# Patient Record
Sex: Male | Born: 1945
Health system: Southern US, Community
[De-identification: ages and names within clinical notes are randomized; demographics above are authoritative.]

## PROBLEM LIST (undated history)

## (undated) DIAGNOSIS — F419 Anxiety disorder, unspecified: Secondary | ICD-10-CM

## (undated) DIAGNOSIS — C3491 Malignant neoplasm of unspecified part of right bronchus or lung: Principal | ICD-10-CM

## (undated) DIAGNOSIS — I82409 Acute embolism and thrombosis of unspecified deep veins of unspecified lower extremity: Secondary | ICD-10-CM

## (undated) DIAGNOSIS — E7800 Pure hypercholesterolemia, unspecified: Secondary | ICD-10-CM

## (undated) DIAGNOSIS — I10 Essential (primary) hypertension: Secondary | ICD-10-CM

## (undated) DIAGNOSIS — K5792 Diverticulitis of intestine, part unspecified, without perforation or abscess without bleeding: Secondary | ICD-10-CM

## (undated) DIAGNOSIS — Z5111 Encounter for antineoplastic chemotherapy: Secondary | ICD-10-CM

## (undated) DIAGNOSIS — K219 Gastro-esophageal reflux disease without esophagitis: Secondary | ICD-10-CM

## (undated) DIAGNOSIS — Z923 Personal history of irradiation: Secondary | ICD-10-CM

## (undated) DIAGNOSIS — I2510 Atherosclerotic heart disease of native coronary artery without angina pectoris: Secondary | ICD-10-CM

## (undated) DIAGNOSIS — M1990 Unspecified osteoarthritis, unspecified site: Secondary | ICD-10-CM

## (undated) DIAGNOSIS — I739 Peripheral vascular disease, unspecified: Secondary | ICD-10-CM

## (undated) DIAGNOSIS — R0602 Shortness of breath: Secondary | ICD-10-CM

## (undated) HISTORY — DX: Shortness of breath: R06.02

## (undated) HISTORY — DX: Unspecified osteoarthritis, unspecified site: M19.90

## (undated) HISTORY — DX: Essential (primary) hypertension: I10

## (undated) HISTORY — DX: Malignant neoplasm of unspecified part of right bronchus or lung: C34.91

## (undated) HISTORY — DX: Peripheral vascular disease, unspecified: I73.9

## (undated) HISTORY — PX: OTHER SURGICAL HISTORY: SHX169

## (undated) HISTORY — DX: Encounter for antineoplastic chemotherapy: Z51.11

## (undated) HISTORY — DX: Pure hypercholesterolemia, unspecified: E78.00

## (undated) HISTORY — DX: Atherosclerotic heart disease of native coronary artery without angina pectoris: I25.10

## (undated) HISTORY — DX: Anxiety disorder, unspecified: F41.9

---

## 2002-09-09 ENCOUNTER — Encounter: Admission: RE | Admit: 2002-09-09 | Discharge: 2002-09-09 | Payer: Self-pay | Admitting: Family Medicine

## 2002-09-09 ENCOUNTER — Encounter: Payer: Self-pay | Admitting: Family Medicine

## 2005-06-29 ENCOUNTER — Ambulatory Visit: Payer: Self-pay | Admitting: Gastroenterology

## 2007-08-09 ENCOUNTER — Emergency Department (HOSPITAL_COMMUNITY): Admission: EM | Admit: 2007-08-09 | Discharge: 2007-08-09 | Payer: Self-pay | Admitting: Emergency Medicine

## 2007-11-19 ENCOUNTER — Ambulatory Visit: Payer: Self-pay | Admitting: Cardiology

## 2007-11-21 ENCOUNTER — Emergency Department (HOSPITAL_COMMUNITY): Admission: EM | Admit: 2007-11-21 | Discharge: 2007-11-21 | Payer: Self-pay | Admitting: Emergency Medicine

## 2007-12-11 ENCOUNTER — Ambulatory Visit: Payer: Self-pay

## 2007-12-11 ENCOUNTER — Ambulatory Visit: Payer: Self-pay | Admitting: Cardiology

## 2008-02-12 ENCOUNTER — Ambulatory Visit: Payer: Self-pay | Admitting: Cardiology

## 2008-03-20 ENCOUNTER — Ambulatory Visit: Payer: Self-pay | Admitting: Cardiology

## 2008-08-26 DIAGNOSIS — I10 Essential (primary) hypertension: Secondary | ICD-10-CM | POA: Insufficient documentation

## 2008-08-26 DIAGNOSIS — E119 Type 2 diabetes mellitus without complications: Secondary | ICD-10-CM | POA: Insufficient documentation

## 2008-08-26 DIAGNOSIS — F411 Generalized anxiety disorder: Secondary | ICD-10-CM | POA: Insufficient documentation

## 2008-08-26 DIAGNOSIS — M129 Arthropathy, unspecified: Secondary | ICD-10-CM | POA: Insufficient documentation

## 2008-08-26 DIAGNOSIS — G20 Parkinson's disease: Secondary | ICD-10-CM | POA: Insufficient documentation

## 2008-08-26 DIAGNOSIS — R0602 Shortness of breath: Secondary | ICD-10-CM | POA: Insufficient documentation

## 2008-08-26 DIAGNOSIS — E7800 Pure hypercholesterolemia, unspecified: Secondary | ICD-10-CM | POA: Insufficient documentation

## 2008-08-27 ENCOUNTER — Ambulatory Visit: Payer: Self-pay | Admitting: Cardiology

## 2008-08-27 ENCOUNTER — Encounter: Payer: Self-pay | Admitting: Cardiology

## 2008-09-02 ENCOUNTER — Ambulatory Visit: Payer: Self-pay | Admitting: Cardiology

## 2008-09-03 LAB — CONVERTED CEMR LAB
BUN: 13 mg/dL (ref 6–23)
Basophils Relative: 0.8 % (ref 0.0–3.0)
CO2: 27 meq/L (ref 19–32)
Calcium: 9 mg/dL (ref 8.4–10.5)
Creatinine, Ser: 0.9 mg/dL (ref 0.4–1.5)
Eosinophils Relative: 1.7 % (ref 0.0–5.0)
Hemoglobin: 16 g/dL (ref 13.0–17.0)
INR: 0.9 (ref 0.8–1.0)
Lymphocytes Relative: 28.7 % (ref 12.0–46.0)
Neutro Abs: 5.2 10*3/uL (ref 1.4–7.7)
Neutrophils Relative %: 62 % (ref 43.0–77.0)
RBC: 5.05 M/uL (ref 4.22–5.81)
WBC: 8.4 10*3/uL (ref 4.5–10.5)

## 2008-09-04 ENCOUNTER — Inpatient Hospital Stay (HOSPITAL_BASED_OUTPATIENT_CLINIC_OR_DEPARTMENT_OTHER): Admission: RE | Admit: 2008-09-04 | Discharge: 2008-09-04 | Payer: Self-pay | Admitting: Cardiovascular Disease

## 2008-09-04 ENCOUNTER — Ambulatory Visit: Payer: Self-pay | Admitting: Cardiology

## 2008-09-08 ENCOUNTER — Ambulatory Visit: Payer: Self-pay | Admitting: Cardiology

## 2008-09-08 ENCOUNTER — Encounter (INDEPENDENT_AMBULATORY_CARE_PROVIDER_SITE_OTHER): Payer: Self-pay

## 2008-09-08 DIAGNOSIS — I2510 Atherosclerotic heart disease of native coronary artery without angina pectoris: Secondary | ICD-10-CM | POA: Insufficient documentation

## 2008-09-15 ENCOUNTER — Telehealth: Payer: Self-pay | Admitting: Cardiology

## 2008-10-02 ENCOUNTER — Encounter: Payer: Self-pay | Admitting: Cardiology

## 2008-10-02 ENCOUNTER — Telehealth: Payer: Self-pay | Admitting: Cardiology

## 2008-10-15 ENCOUNTER — Inpatient Hospital Stay (HOSPITAL_COMMUNITY): Admission: AD | Admit: 2008-10-15 | Discharge: 2008-10-16 | Payer: Self-pay | Admitting: Cardiology

## 2008-10-15 ENCOUNTER — Ambulatory Visit: Payer: Self-pay | Admitting: Cardiology

## 2008-11-12 ENCOUNTER — Ambulatory Visit: Payer: Self-pay | Admitting: Internal Medicine

## 2008-11-12 DIAGNOSIS — Z720 Tobacco use: Secondary | ICD-10-CM | POA: Insufficient documentation

## 2008-11-18 ENCOUNTER — Telehealth: Payer: Self-pay | Admitting: Cardiology

## 2009-02-09 ENCOUNTER — Ambulatory Visit (HOSPITAL_COMMUNITY): Admission: RE | Admit: 2009-02-09 | Discharge: 2009-02-09 | Payer: Self-pay | Admitting: Pulmonary Disease

## 2009-03-06 ENCOUNTER — Encounter (INDEPENDENT_AMBULATORY_CARE_PROVIDER_SITE_OTHER): Payer: Self-pay | Admitting: *Deleted

## 2009-05-12 ENCOUNTER — Ambulatory Visit: Payer: Self-pay | Admitting: Cardiology

## 2009-05-21 ENCOUNTER — Encounter (INDEPENDENT_AMBULATORY_CARE_PROVIDER_SITE_OTHER): Payer: Self-pay | Admitting: *Deleted

## 2009-05-21 ENCOUNTER — Ambulatory Visit (HOSPITAL_COMMUNITY): Admission: RE | Admit: 2009-05-21 | Discharge: 2009-05-21 | Payer: Self-pay | Admitting: Family Medicine

## 2009-05-22 ENCOUNTER — Encounter (INDEPENDENT_AMBULATORY_CARE_PROVIDER_SITE_OTHER): Payer: Self-pay | Admitting: *Deleted

## 2009-06-03 ENCOUNTER — Ambulatory Visit (HOSPITAL_COMMUNITY): Admission: RE | Admit: 2009-06-03 | Discharge: 2009-06-05 | Payer: Self-pay | Admitting: General Surgery

## 2009-07-29 ENCOUNTER — Ambulatory Visit (HOSPITAL_COMMUNITY): Admission: RE | Admit: 2009-07-29 | Discharge: 2009-07-29 | Payer: Self-pay | Admitting: General Surgery

## 2009-10-02 ENCOUNTER — Encounter: Admission: RE | Admit: 2009-10-02 | Discharge: 2009-10-02 | Payer: Self-pay | Admitting: Gastroenterology

## 2009-11-18 ENCOUNTER — Ambulatory Visit: Payer: Self-pay | Admitting: Cardiology

## 2009-11-25 ENCOUNTER — Encounter: Payer: Self-pay | Admitting: Cardiology

## 2010-01-27 ENCOUNTER — Emergency Department (HOSPITAL_COMMUNITY): Admission: EM | Admit: 2010-01-27 | Discharge: 2010-01-27 | Payer: Self-pay | Admitting: Emergency Medicine

## 2010-01-28 ENCOUNTER — Ambulatory Visit (HOSPITAL_COMMUNITY): Admission: RE | Admit: 2010-01-28 | Discharge: 2010-01-28 | Payer: Self-pay | Admitting: Family Medicine

## 2010-03-31 ENCOUNTER — Emergency Department (HOSPITAL_COMMUNITY): Admission: EM | Admit: 2010-03-31 | Discharge: 2010-03-31 | Payer: Self-pay | Admitting: Emergency Medicine

## 2010-05-12 ENCOUNTER — Ambulatory Visit: Payer: Self-pay | Admitting: Cardiology

## 2010-06-13 LAB — CONVERTED CEMR LAB
Albumin: 4.7 g/dL (ref 3.5–5.2)
Alkaline Phosphatase: 74 units/L (ref 39–117)
BUN: 18 mg/dL (ref 6–23)
Bilirubin, Direct: 0.1 mg/dL (ref 0.0–0.3)
CO2: 27 meq/L (ref 19–32)
Chloride: 106 meq/L (ref 96–112)
Cholesterol: 123 mg/dL (ref 0–200)
Glucose, Bld: 150 mg/dL — ABNORMAL HIGH (ref 70–99)
Potassium: 4.5 meq/L (ref 3.5–5.1)
Total Bilirubin: 0.6 mg/dL (ref 0.3–1.2)
Total CHOL/HDL Ratio: 3
VLDL: 42.6 mg/dL — ABNORMAL HIGH (ref 0.0–40.0)

## 2010-06-15 NOTE — Assessment & Plan Note (Signed)
Summary: f16m   Visit Type:  6 months follow up  CC:  No complains.  History of Present Illness: Overall he is doing well.  Continues to smoke.  Gets hot and sweaty at times, but lays under the air conditioning.  He has not been having chest pain.  Mostly worried about his wife.    Current Medications (verified): 1)  Comtan 200 Mg Tabs (Entacapone) .... Three Times A Day 2)  Alprazolam 0.25 Mg Tbdp (Alprazolam) .[name].. 1 Tab Two Times A Day 3)  Ropinirole Hcl 3 Mg Tabs (Ropinirole Hcl) .[name].. 1 Tab Four Times A Day 4)  Topamax 100 Mg Tabs (Topiramate) .[name].. 1 Tab Two Times A Day 5)  Benzonatate 200 Mg Caps (Benzonatate) .... As Needed 6)  Metformin Hcl 500 Mg Tabs (Metformin Hcl) .[name].. 1 Tab Two Times A Day 7)  Morphine Sulfate 15 Mg/ml Soln (Morphine Sulfate) .... Tid 8)  Amitriptyline Hcl 50 Mg Tabs (Amitriptyline Hcl) .... 2 Tabs At Bedtime 9)  Vitamin B-12 Cr 2000 Mcg Cr-Tabs (Cyanocobalamin) .[name].. 1 Tab Daily 10)  Tizanidine Hcl 4 Mg Tabs (Tizanidine Hcl) .[name].. 1 Tab 3 X Daily 11)  Plavix 75 Mg Tabs (Clopidogrel Bisulfate) .[name].. 1 Tab Daily 12)  Carbidopa-Levodopa Cr 50-200 Mg Cr-Tabs (Carbidopa-Levodopa) .... Take 1 Tablet By Mouth Two Times A Day 13)  Zetia 10 Mg Tabs (Ezetimibe) .... Daily 14)  Protonix 40 Mg Tbec (Pantoprazole Sodium) .[name].. 1 Tab Once Daily 15)  Levothyroxine Sodium 25 Mcg Tabs (Levothyroxine Sodium) .... Take 1 Tablet By Mouth Once A Day 16)  Meloxicam 15 Mg Tabs (Meloxicam) .... Take 1 Tablet By Mouth Once A Day 17)  Nitrostat 0.4 Mg Subl (Nitroglycerin) .... As Needed 18)  Crestor 20 Mg Tabs (Rosuvastatin Calcium) .... Take One Tablet By Mouth Daily. 19)  Gabapentin 300 Mg Caps (Gabapentin) .... Take 1 Capsule By Mouth Two Times A Day  Allergies: 1)  ! Aspirin  Vital Signs:  Patient profile:   65 year old male Height:      68 inches Weight:      191.75 pounds BMI:     29.26 Pulse rate:   62 / minute Pulse rhythm:   regular Resp:     18 per minute BP  sitting:   130 / 70  (left arm) Cuff size:   large  Vitals Entered By: Vikki Ports (November 18, 2009 11:39 AM)  Physical Exam  General:  Well developed, well nourished, in no acute distress. Head:  normocephalic and atraumatic Eyes:  PERRLA/EOM intact; conjunctiva and lids normal. Neck:  No bruits.  Lungs:  Clear bilaterally to auscultation and percussion. Heart:  Distant heart sounds.  No definite murmur. Abdomen:  Umbilical hernia repair.  Neurologic:  Alert and oriented x 3.   EKG  Procedure date:  11/18/2009  Findings:      NSR. WNL.   Cardiac Cath  Procedure date:  10/15/2008  Findings:      ANGIOGRAPHIC DATA: 1. The left main has calcification, mild luminal irregularity, but     noncritical disease. 2. The LAD has a lesion of probably 70-80% that is calcified and     eccentric in the ostium.  The distal left anterior descending     artery has diffuse luminal irregularity compatible with diabetes.     The diagonal also has similar findings.  The circumflex artery has     diffuse 40% narrowing leading into a moderate size marginal branch,     but  it does not appear to be flow limiting.  Fractional flow     reserve was performed after 40 mcg of adenosine and also 50 mcg of     adenosine, in both cases it was measured at 0.94. 3. Intravascular ultrasound was performed after intracoronary     nitroglycerin administration.  The reference vessel demonstrated a     lumen of approximately 3.5 x nearly 3.5 with moderate plaque.  The     ostium leading into the left main does have an eccentric calcified     shelf, but at the most severe point where the left main comes in,     it is probably 3 mm x 2 mm.  This corresponds and correlates to     some extent with the findings of FFR.   CONCLUSION:  Moderately high-grade ostial stenosis of the left anterior descending artery, which by intravascular ultrasound and fractional flow reserve, does not appear to be flow limiting.     DISPOSITION:  My biggest concern is the patient has diabetes and continues to smoke.  His risk of acute coronary syndrome remains increased.  We will try to do our best to try to change this behavior, I have reviewed the case with Dr. Excell Seltzer Dr. Clifton James and leaning at the present time towards medical therapy.         [name]. Riley Kill, MD, [facility] Electronically Signed        Impression & Recommendations:  Problem # 1:  CAD (ICD-414.00)  Sympotms are stable at this point in time. Denies progressive chest pain.  See cath report from one year ago.  Have discussed smoking. His updated medication list for this problem includes:    Plavix 75 Mg Tabs (Clopidogrel bisulfate) .[name]... 1 tab daily    Nitrostat 0.4 Mg Subl (Nitroglycerin) .[name]... As needed  His updated medication list for this problem includes:    Plavix 75 Mg Tabs (Clopidogrel bisulfate) .[name]... 1 tab daily    Nitrostat 0.4 Mg Subl (Nitroglycerin) .[name]... As needed  Orders: EKG w/ Interpretation (93000) TLB-BMP (Basic Metabolic Panel-BMET) (80048-METABOL) TLB-Lipid Panel (80061-LIPID) TLB-Hepatic/Liver Function Pnl (80076-HEPATIC)  Problem # 2:  HYPERTENSION, UNSPECIFIED (ICD-401.9) Currently controlled.   Problem # 3:  HYPERCHOLESTEROLEMIA  IIA (ICD-272.0) Needs lab work done.  His updated medication list for this problem includes:    Zetia 10 Mg Tabs (Ezetimibe) .[name]... Daily    Crestor 20 Mg Tabs (Rosuvastatin calcium) .[name]... Take one tablet by mouth daily.  Orders: EKG w/ Interpretation (93000) TLB-BMP (Basic Metabolic Panel-BMET) (80048-METABOL) TLB-Lipid Panel (80061-LIPID) TLB-Hepatic/Liver Function Pnl (80076-HEPATIC)  Problem # 4:  TOBACCO ABUSE (ICD-305.1) difficult to crack with regard to needs.  Clearly important from prognostic standpoint.   Patient Instructions: 1)  Your physician recommends that you have a Lipid and Liver profile today.  2)  Your physician recommends that you continue on your current  medications as directed. Please refer to the Current Medication list given to you today. 3)  Your physician wants you to follow-up in: 6 MONTHS.  You will receive a reminder letter in the mail two months in advance. If you don't receive a letter, please call our office to schedule the follow-up appointment.

## 2010-06-15 NOTE — Letter (Signed)
Summary: Internal Medicine & Disease of the Chest   Internal Medicine & Disease of the Chest   Imported By: Roderic Ovens 12/11/2009 13:44:43  _____________________________________________________________________  External Attachment:    Type:   Image     Comment:   External Document

## 2010-06-15 NOTE — Letter (Signed)
Summary: New Patient letter  [facility]  [address] [place], [place] [address]   Phone: [phone]  Fax: [phone]       05/22/2009 MRN: [id number]  Matthew Wilkins [address] [place] [address] [place], [place]  [address]  Dear Mr. Matthew Wilkins,  Welcome to the Gastroenterology Division at [facility].    You are scheduled to see Dr.  Stan Head on June 15, 2009 at 2:00pm on the 3rd floor at [facility], [address]  We ask that you try to arrive at our office 15 minutes prior to your appointment time to allow for check-in.  We would like you to complete the enclosed self-administered evaluation form prior to your visit and bring it with you on the day of your appointment.  We will review it with you.  Also, please bring a complete list of all your medications or, if you prefer, bring the medication bottles and we will list them.  Please bring your insurance card so that we may make a copy of it.  If your insurance requires a referral to see a specialist, please bring your referral form from your primary care physician.  Co-payments are due at the time of your visit and may be paid by cash, check or credit card.     Your office visit will consist of a consult with your physician (includes a physical exam), any laboratory testing he/she may order, scheduling of any necessary diagnostic testing (e.g. x-ray, ultrasound, CT-scan), and scheduling of a procedure (e.g. Endoscopy, Colonoscopy) if required.  Please allow enough time on your schedule to allow for any/all of these possibilities.    If you cannot keep your appointment, please call [phone] to cancel or reschedule prior to your appointment date.  This allows [place] the opportunity to schedule an appointment for another patient in need of care.  If you do not cancel or reschedule by 5 p.m. the business day prior to your appointment date, you will be charged a $50.00 late cancellation/no-show fee.    Thank you  for choosing [facility] for your medical needs.  We appreciate the opportunity to care for you.  Please visit [place] at our website  to learn more about our practice.                     Sincerely,                                                             The Gastroenterology Division

## 2010-07-06 ENCOUNTER — Encounter: Payer: Self-pay | Admitting: Cardiology

## 2010-07-06 ENCOUNTER — Ambulatory Visit (INDEPENDENT_AMBULATORY_CARE_PROVIDER_SITE_OTHER): Payer: Self-pay | Admitting: Cardiology

## 2010-07-06 DIAGNOSIS — J449 Chronic obstructive pulmonary disease, unspecified: Secondary | ICD-10-CM | POA: Insufficient documentation

## 2010-07-06 DIAGNOSIS — I2510 Atherosclerotic heart disease of native coronary artery without angina pectoris: Secondary | ICD-10-CM

## 2010-07-06 DIAGNOSIS — E7800 Pure hypercholesterolemia, unspecified: Secondary | ICD-10-CM

## 2010-07-22 NOTE — Assessment & Plan Note (Signed)
Summary: Matthew Wilkins   Primary Provider:  spear  CC:  check up.  History of Present Illness: Not having any chest pain.  Meds for pulmonary have helped.  His legs hands and shoulders have been bothering him more than anything.  He has been going to a chiropractor for a long time, and has arthitis for many years.  No exertional chest pain. He does not do much digging, or other heavy activities.  Still coughing quite a bit.    Problems Prior to Update: 1)  COPD  (ICD-496) 2)  Tobacco Abuse  (ICD-305.1) 3)  Cad  (ICD-414.00) 4)  Hypertension, Unspecified  (ICD-401.9) 5)  Hypercholesterolemia Iia  (ICD-272.0) 6)  Dm  (ICD-250.00) 7)  Shortness of Breath  (ICD-786.05) 8)  Anxiety  (ICD-300.00) 9)  Parkinson's Disease  (ICD-332.0) 10)  Arthritis  (ICD-716.90)  Current Medications (verified): 1)  Comtan 200 Mg Tabs (Entacapone) .... Three Times A Day 2)  Alprazolam 0.25 Mg Tbdp (Alprazolam) .[name].. 1 Tab Two Times A Day 3)  Ropinirole Hcl 3 Mg Tabs (Ropinirole Hcl) .[name].. 1 Tab Four Times A Day 4)  Topamax 100 Mg Tabs (Topiramate) .[name].. 1 Tab Two Times A Day 5)  Benzonatate 200 Mg Caps (Benzonatate) .... As Needed 6)  Metformin Hcl 500 Mg Tabs (Metformin Hcl) .[name].. 1 Tab Two Times A Day 7)  Amitriptyline Hcl 50 Mg Tabs (Amitriptyline Hcl) .... 2 Tabs At Bedtime 8)  Vitamin B-12 Cr 2000 Mcg Cr-Tabs (Cyanocobalamin) .[name].. 1 Tab Daily 9)  Tizanidine Hcl 4 Mg Tabs (Tizanidine Hcl) .[name].. 1 Tab 3 X Daily 10)  Plavix 75 Mg Tabs (Clopidogrel Bisulfate) .[name].. 1 Tab Daily 11)  Carbidopa-Levodopa Cr 50-200 Mg Cr-Tabs (Carbidopa-Levodopa) .... Take 1 Tablet By Mouth Two Times A Day 12)  Zetia 10 Mg Tabs (Ezetimibe) .... Daily 13)  Protonix 40 Mg Tbec (Pantoprazole Sodium) .[name].. 1 Tab Once Daily 14)  Levothyroxine Sodium 25 Mcg Tabs (Levothyroxine Sodium) .... Take 1 Tablet By Mouth Once A Day 15)  Meloxicam 15 Mg Tabs (Meloxicam) .... Take 1 Tablet By Mouth Once A Day 16)  Nitrostat 0.4 Mg Subl (Nitroglycerin) ....  As Needed 17)  Crestor 20 Mg Tabs (Rosuvastatin Calcium) .... Take One Tablet By Mouth Daily. 18)  Gabapentin 300 Mg Caps (Gabapentin) .... Take 1 Capsule By Mouth Two Times A Day  Allergies: 1)  ! Aspirin  Past History:  Past Medical History: Last updated: 11/12/2008 HYPERTENSION, UNSPECIFIED (ICD-401.9) HYPERCHOLESTEROLEMIA  IIA (ICD-272.0) DM (ICD-250.00) SHORTNESS OF BREATH (ICD-786.05) on exertion ANXIETY (ICD-300.00) PARKINSON'S DISEASE (ICD-332.0) ARTHRITIS (ICD-716.90) CAD      a.  10/2008 flow wire and ivus of ostial LAD revealing no significant flow limitation with FFR 0.94  Vital Signs:  Patient profile:   65 year old male Height:      68 inches Weight:      196 pounds BMI:     29.91 Pulse rate:   72 / minute Resp:     18 per minute BP sitting:   155 / 79  (left arm)  Vitals Entered By: Kem Parkinson (July 06, 2010 4:08 PM)  Physical Exam  General:  Well developed, well nourished, in no acute distress. Head:  normocephalic and atraumatic Eyes:  PERRLA/EOM intact; conjunctiva and lids normal. Lungs:  No rales.  Some prolonged expiration.  Heart:  Non-displaced PMI, chest non-tender; regular rate and rhythm, S1, S2 without murmurs, rubs or gallops. Carotid upstroke normal, no bruit.  Abdomen:  Bowel sounds positive; abdomen soft and  non-tender without masses, organomegaly, or hernias noted. No hepatosplenomegaly. Pulses:  decrease  Extremities:  No clubbing or cyanosis. Neurologic:  Alert and oriented x 3.   Cardiac Cath  Procedure date:  10/15/2008  Findings:      CONCLUSION:  Moderately high-grade ostial stenosis of the left anterior descending artery, which by intravascular ultrasound and fractional flow reserve, does not appear to be flow limiting.   DISPOSITION:  My biggest concern is the patient has diabetes and continues to smoke.  His risk of acute coronary syndrome remains increased.  We will try to do our best to try to change this  behavior, I have reviewed the case with Dr. Excell Seltzer Dr. Clifton James and leaning at the present time towards medical therapy.    Impression & Recommendations:  Problem # 1:  CAD (ICD-414.00) Had neg FFR in prox LAD.  Has no current symptoms.  Continues on medical therapy at present.  His updated medication list for this problem includes:    Plavix 75 Mg Tabs (Clopidogrel bisulfate) .[name]... 1 tab daily    Nitrostat 0.4 Mg Subl (Nitroglycerin) .[name]... As needed  Orders: EKG w/ Interpretation (93000)  Problem # 2:  HYPERCHOLESTEROLEMIA  IIA (ICD-272.0) Encouraged him to have meds rechecked with Dr. Collins Scotland on upcoming follow up. His updated medication list for this problem includes:    Zetia 10 Mg Tabs (Ezetimibe) .[name]... Daily    Crestor 20 Mg Tabs (Rosuvastatin calcium) .[name]... Take one tablet by mouth daily.  Problem # 3:  TOBACCO ABUSE (ICD-305.1) Still has not quit, and understands that he must do so, but been unable to accomplish.  His wife has also resumed smoking.   Smokes up to 1/2 PPD.   Problem # 4:  COPD (ICD-496) Still evidence on examination.  Patient Instructions: 1)  Your physician recommends that you schedule a follow-up appointment in: 6 months with Dr. Riley Kill 2)  Your physician recommends that you continue on your current medications as directed. Please refer to the Current Medication list given to you today.

## 2010-07-29 LAB — ETHANOL: Alcohol, Ethyl (B): <5 mg/dL (ref 0–10)

## 2010-07-29 LAB — URINALYSIS, ROUTINE W REFLEX MICROSCOPIC
Specific Gravity, Urine: 1.025 (ref 1.005–1.030)
Urobilinogen, UA: 0.2 mg/dL (ref 0.0–1.0)
pH: 6 (ref 5.0–8.0)

## 2010-07-29 LAB — RAPID URINE DRUG SCREEN, HOSP PERFORMED
Amphetamines: NOT DETECTED
Benzodiazepines: POSITIVE — AB

## 2010-07-29 LAB — DIFFERENTIAL
Basophils Absolute: 0.2 10*3/uL — ABNORMAL HIGH (ref 0.0–0.1)
Lymphocytes Relative: 28 % (ref 12–46)
Monocytes Relative: 6 % (ref 3–12)

## 2010-07-29 LAB — BASIC METABOLIC PANEL
BUN: 12 mg/dL (ref 6–23)
Creatinine, Ser: 0.98 mg/dL (ref 0.4–1.5)
GFR calc non Af Amer: >60 mL/min (ref >60)
Potassium: 4 mEq/L (ref 3.5–5.1)

## 2010-07-29 LAB — URINE MICROSCOPIC-ADD ON

## 2010-07-29 LAB — CBC
Platelets: 182 10*3/uL (ref 150–400)
RDW: 13.9 % (ref 11.5–15.5)
WBC: 8.1 10*3/uL (ref 4.0–10.5)

## 2010-07-29 LAB — GLUCOSE, CAPILLARY: Glucose-Capillary: 180 mg/dL — ABNORMAL HIGH (ref 70–99)

## 2010-08-01 LAB — BASIC METABOLIC PANEL
GFR calc non Af Amer: >60 mL/min (ref >60)
Potassium: 4.3 mEq/L (ref 3.5–5.1)
Sodium: 135 mEq/L (ref 135–145)

## 2010-08-01 LAB — HEMOGLOBIN AND HEMATOCRIT, BLOOD: Hemoglobin: 16.8 g/dL (ref 13.0–17.0)

## 2010-08-01 LAB — GLUCOSE, CAPILLARY: Glucose-Capillary: 284 mg/dL — ABNORMAL HIGH (ref 70–99)

## 2010-08-08 LAB — BASIC METABOLIC PANEL
CO2: 25 mEq/L (ref 19–32)
Calcium: 9.1 mg/dL (ref 8.4–10.5)
Creatinine, Ser: 0.86 mg/dL (ref 0.4–1.5)
GFR calc Af Amer: >60 mL/min (ref >60)

## 2010-08-08 LAB — CBC
MCHC: 34.3 g/dL (ref 30.0–36.0)
Platelets: 173 10*3/uL (ref 150–400)
RBC: 5.51 MIL/uL (ref 4.22–5.81)

## 2010-08-08 LAB — GLUCOSE, CAPILLARY
Glucose-Capillary: 138 mg/dL — ABNORMAL HIGH (ref 70–99)
Glucose-Capillary: 156 mg/dL — ABNORMAL HIGH (ref 70–99)

## 2010-08-19 ENCOUNTER — Other Ambulatory Visit (HOSPITAL_COMMUNITY): Payer: Self-pay | Admitting: Cardiovascular Disease

## 2010-08-19 ENCOUNTER — Encounter (HOSPITAL_COMMUNITY): Payer: Self-pay

## 2010-08-19 ENCOUNTER — Ambulatory Visit (HOSPITAL_COMMUNITY)
Admission: RE | Admit: 2010-08-19 | Discharge: 2010-08-19 | Disposition: A | Discharge status: Home / Self Care | Payer: Medicare Other | Source: Ambulatory Visit | Attending: Cardiovascular Disease | Admitting: Cardiovascular Disease

## 2010-08-19 DIAGNOSIS — J4489 Other specified chronic obstructive pulmonary disease: Secondary | ICD-10-CM | POA: Insufficient documentation

## 2010-08-19 DIAGNOSIS — Z01810 Encounter for preprocedural cardiovascular examination: Secondary | ICD-10-CM

## 2010-08-19 DIAGNOSIS — J449 Chronic obstructive pulmonary disease, unspecified: Secondary | ICD-10-CM | POA: Insufficient documentation

## 2010-08-19 DIAGNOSIS — Z01818 Encounter for other preprocedural examination: Secondary | ICD-10-CM | POA: Insufficient documentation

## 2010-08-20 LAB — BLOOD GAS, ARTERIAL
Acid-base deficit: 0.7 mmol/L (ref 0.0–2.0)
O2 Saturation: 95.6 %
Patient temperature: 37
pCO2 arterial: 36 mmHg (ref 35.0–45.0)

## 2010-08-23 LAB — BASIC METABOLIC PANEL
BUN: 10 mg/dL (ref 6–23)
Calcium: 8.9 mg/dL (ref 8.4–10.5)
Creatinine, Ser: 0.85 mg/dL (ref 0.4–1.5)
GFR calc Af Amer: >60 mL/min (ref >60)
GFR calc non Af Amer: >60 mL/min (ref >60)
GFR calc non Af Amer: >60 mL/min (ref >60)
Glucose, Bld: 162 mg/dL — ABNORMAL HIGH (ref 70–99)
Potassium: 4 mEq/L (ref 3.5–5.1)

## 2010-08-23 LAB — PROTIME-INR
INR: 1.1 (ref 0.00–1.49)
Prothrombin Time: 14.2 seconds (ref 11.6–15.2)

## 2010-08-23 LAB — CBC
HCT: 43 % (ref 39.0–52.0)
MCHC: 34.5 g/dL (ref 30.0–36.0)
MCV: 92.4 fL (ref 78.0–100.0)
Platelets: 143 10*3/uL — ABNORMAL LOW (ref 150–400)
RBC: 5.14 MIL/uL (ref 4.22–5.81)
RDW: 13.4 % (ref 11.5–15.5)
WBC: 7.2 10*3/uL (ref 4.0–10.5)

## 2010-08-23 LAB — GLUCOSE, CAPILLARY: Glucose-Capillary: 123 mg/dL — ABNORMAL HIGH (ref 70–99)

## 2010-08-25 LAB — POCT I-STAT GLUCOSE: Glucose, Bld: 144 mg/dL — ABNORMAL HIGH (ref 70–99)

## 2010-09-07 ENCOUNTER — Observation Stay (HOSPITAL_COMMUNITY)
Admission: RE | Admit: 2010-09-07 | Discharge: 2010-09-08 | Disposition: A | Discharge status: Home / Self Care | Payer: Medicare Other | Source: Ambulatory Visit | Attending: Cardiovascular Disease | Admitting: Cardiovascular Disease

## 2010-09-07 DIAGNOSIS — E785 Hyperlipidemia, unspecified: Secondary | ICD-10-CM | POA: Insufficient documentation

## 2010-09-07 DIAGNOSIS — I70219 Atherosclerosis of native arteries of extremities with intermittent claudication, unspecified extremity: Principal | ICD-10-CM | POA: Insufficient documentation

## 2010-09-07 DIAGNOSIS — R0609 Other forms of dyspnea: Secondary | ICD-10-CM | POA: Insufficient documentation

## 2010-09-07 DIAGNOSIS — F17200 Nicotine dependence, unspecified, uncomplicated: Secondary | ICD-10-CM | POA: Insufficient documentation

## 2010-09-07 DIAGNOSIS — E119 Type 2 diabetes mellitus without complications: Secondary | ICD-10-CM | POA: Insufficient documentation

## 2010-09-07 DIAGNOSIS — J4489 Other specified chronic obstructive pulmonary disease: Secondary | ICD-10-CM | POA: Insufficient documentation

## 2010-09-07 DIAGNOSIS — J449 Chronic obstructive pulmonary disease, unspecified: Secondary | ICD-10-CM | POA: Insufficient documentation

## 2010-09-07 DIAGNOSIS — R0989 Other specified symptoms and signs involving the circulatory and respiratory systems: Secondary | ICD-10-CM | POA: Insufficient documentation

## 2010-09-07 DIAGNOSIS — I2510 Atherosclerotic heart disease of native coronary artery without angina pectoris: Secondary | ICD-10-CM | POA: Insufficient documentation

## 2010-09-07 LAB — GLUCOSE, CAPILLARY
Glucose-Capillary: 314 mg/dL — ABNORMAL HIGH (ref 70–99)
Glucose-Capillary: 211 mg/dL — ABNORMAL HIGH (ref 70–99)

## 2010-09-08 LAB — GLUCOSE, CAPILLARY: Glucose-Capillary: 192 mg/dL — ABNORMAL HIGH (ref 70–99)

## 2010-09-08 LAB — BASIC METABOLIC PANEL
GFR calc Af Amer: >60 mL/min (ref >60)
GFR calc non Af Amer: >60 mL/min (ref >60)
Potassium: 3.9 mEq/L (ref 3.5–5.1)
Sodium: 137 mEq/L (ref 135–145)

## 2010-09-08 LAB — CBC
Platelets: 173 10*3/uL (ref 150–400)
RDW: 14.2 % (ref 11.5–15.5)
WBC: 8 10*3/uL (ref 4.0–10.5)

## 2010-09-08 NOTE — Procedures (Signed)
NAME:  Matthew Wilkins, Matthew Wilkins NO.:  [id number]  MEDICAL RECORD NO.:  [id number]           PATIENT TYPE:  O  LOCATION:  6524                         FACILITY:  [facility]  PHYSICIAN:  Nanetta Batty, M.D.   DATE OF BIRTH:  May 16, 1946  DATE OF PROCEDURE: DATE OF DISCHARGE:                   PERIPHERAL VASCULAR INVASIVE PROCEDURE   Matthew Wilkins is a delightful 65 year old mildly overweight married Caucasian male, father of five, grandfather of 13 grandchildren who is referred to the courtesy of Dr. Virgel Bouquet care for peripheral vascular evaluation and claudication.  The patient's risk factors include 25-50 pack years of tobacco abuse, he is smoking 1/2 pack per day, diabetes and hyperlipidemia.  He denies chest pain, but does have COPD and complaints of dyspnea.  He has been cathed before and found to have a noncritical CAD.  He complains of lower extremity claudication with Dopplers which showed diminished ABIs bilaterally, 0.73 in the right and 0.84 on the left.  The patient said his right leg is symptomatically worse.  He presents now for angiography and potential percutaneous intervention for lifestyle-limiting claudication.  PROCEDURE DESCRIPTION:  The patient was brought to the second floor [facility] angiographic suite in the post absorptive state.  He was medicated with p.o. Valium and IV fentanyl.  His left groin was prepped and shaved in the usual sterile fashion.  Xylocaine 1% was used for local anesthesia.  A 5 upgraded to a 7-French sheath was inserted into the left femoral artery using standard Seldinger technique.  A 5-French pigtail catheter was used for abdominal aortography using bolus tip chase digital subtraction step table technique.  Visipaque dye was used for the entirety of the case.  The retrograde and aortic pressures were monitored during the case.  ANGIOGRAPHIC RESULTS: 1. Abdominal aorta.     a.     Renal arteries - normal.  The infrarenal  abdominal aorta -      normal. 2. Left lower extremity.     a.     Short segment occlusion at the ostium/proximal portion of      the left SFA with reconstitution proximally by collaterals with      three-vessel runoff. 3. Right lower extremity.     a.     99% proximal fairly focal right SFA stenosis, three-vessel      runoff.  PROCEDURE DESCRIPTION:  The patient received 5000 units of heparin intravenously with the ending ACT of 210.  Contralateral access was obtained with a crossover catheter, 0.035 Versacore wire, and 7-French pedicle sheath.  Visipaque dye was used for the entirety of the case. Retrograde and aortic pressures were monitored during the case.  The Versacore wire was used to cross the lesion.  Lesion was then dilated with a 4 x 2 balloon and was stented with a 7 x 40 Abbott Absolute Pro nitinol self-expanding stent.  Postdilatation was performed with a 6 x 4 balloon at 2 atmospheres resulting in reduction of a 99% proximal right SFA stenosis to 0% residual.  The patient tolerated the procedure well.  The sheath was then withdrawn across bifurcation and exchanged for short 7-French sheath.  A 7-French Exoseal was used  to hemostatically seal the left common femoral puncture site successfully. The patient left lab in stable condition.  Total of 230 mL of contrast was used during the case.  Estimated blood loss was less than 10 mL.  IMPRESSION:  Successful percutaneous transluminal angioplasty and stenting of the right proximal superficial femoral artery for lifestyle- limiting claudication.  The patient will be hydrated overnight, discharged on the morning on Plavix.  We will get followup Dopplers and ABIs.  He will see me back in the office after that.  Dr. Herb Grays was notified of these results.     Nanetta Batty, M.D.     JB/MEDQ  D:  09/07/2010  T:  09/08/2010  Job:  [id number]  cc:   [facility] Tammy R. Collins Scotland, M.D.  Electronically Signed by Nanetta Batty M.D. on 09/08/2010 04:13:42 PM

## 2010-09-21 NOTE — Discharge Summary (Signed)
NAME:  Matthew Wilkins, Matthew Wilkins NO.:  [id number]  MEDICAL RECORD NO.:  [id number]           PATIENT TYPE:  LOCATION:                                 FACILITY:  PHYSICIAN:  Nanetta Batty, M.D.   DATE OF BIRTH:  [birth year]  DATE OF ADMISSION:  09/07/2010 DATE OF DISCHARGE:  09/08/2010                              DISCHARGE SUMMARY   DISCHARGE DIAGNOSES: 1. Peripheral vascular disease status post PTA and stenting of the     right proximal superficial femoral artery.  He has 3-vessel runoff.     He also has a short segment occlusion at the ostium/proximal     portion of the left superficial femoral artery with reconstitution     proximally by collaterals and 3-vessel runoff, normal renal     arteries.  Normal infrarenal abdominal aorta. 2. History of chronic obstructive pulmonary disease. 3. History of mini strokes in the past. 4. History of coronary artery disease.  Last cath was October 15, 2008,     revealing a moderate left anterior descending lesion. 5. Diabetes. 6. Hypothyroidism.  HOSPITAL COURSE:  Matthew Wilkins is a 65 year old Caucasian male with history of COPD, coronary artery disease, peripheral vascular disease, diabetes mellitus, hypothyroidism, dyslipidemia who presented for lower extremity angiogram which resulted in a stent to the right superficial femoral artery.  The patient did well during the procedure, seen by Allyson Sabal, physically stable for discharge home on aspirin and Plavix, outpatient lower extremity Doppler's and followup office visit with Dr. Allyson Sabal.  DISCHARGE LABS:  WBCs 8.0, hemoglobin 15.8, hematocrit 45.1, platelets 173,000.  STUDIES/PROCEDURES:  Lower extremity angiogram September 07, 2010, renal arteries were normal.  The infrarenal abdominal aorta was normal.  Short segment occlusion at the ostium/proximal portion of the left SFA with reconstitution proximally by collaterals with a 3-vessel runoff.  Lower right extremity was 99% proximal  focal right SFA stenosis with 3-vessel runoff.  Successful percutaneous transluminal angioplasty and stenting of the right proximal superficial femoral artery for lifestyle limiting claudication.  DISCHARGE MEDICATIONS: 1. Aspirin 325 mg 1 tablet by mouth daily. 2. Alprazolam 0.25 mg 1 tablet by mouth every evening. 3. Carbidopa/levodopa 5/200 mg 1 tablet by mouth three times a day. 4. Crestor 20 mg 1 tablet by mouth twice daily. 5. Glipizide XL 2.5 mg 1 tablet by mouth daily. 6. Glipizide XL 5.0 mg 1 tablet by mouth every morning. 7. Gabapentin 300 mg 1 tab by mouth twice daily. 8. Levothyroxine 25 mcg 1 tablet by mouth every morning. 9. Lortab 5/500 mg 1-2 tablets by mouth every 4 hours as needed for     pain. 10.Metformin HCl 500 mg 2 tablets by mouth twice daily.  The patient     is to restart this medication on Thursday, September 09, 2010. 11.Nexium 40 mg 1 capsule by mouth every morning. 12.Plavix 75 mg 1 tablet by mouth every morning. 13.Ropinirole 3 mg 1 tablet by mouth 4 times a day. 14.Spiriva 18 mcg 1 capsule inhaled every morning. 15.Topamax 100 mg 1 tablet by mouth twice daily. 16.Vitamin B12 over-the-counter 1 tablet by mouth daily. 17.Zetia 10 mg 1 tablet by  mouth every morning.  DISPOSITION:  Matthew Wilkins was discharged home in stable condition.  He is recommended to increase his activity slowly.  No lifting or driving for 2 days.  He is recommended to eat heart-healthy diet.  If the catheter site becomes red, painful, discharges fluid or pus to call our office.  He will follow up with [facility] for lower extremity Doppler's on Sep 17, 2010 at 3:00 p.m. and then Dr. Allyson Sabal Sep 22, 2010 at 3:15 p.m.    ______________________________ Matthew Finlay, PA   ______________________________ Nanetta Batty, M.D.    BH/MEDQ  D:  09/08/2010  T:  09/08/2010  Job:  [id number]  cc:   Nanetta Batty, M.D. Tammy R. Collins Scotland, M.D. [name]. Riley Kill, MD,  [facility]  Electronically Signed by Matthew Finlay PA on 09/10/2010 11:22:58 AM Electronically Signed by Nanetta Batty M.D. on 09/21/2010 07:38:06 AM

## 2010-09-28 NOTE — Letter (Signed)
February 12, 2008    Tammy R. Collins Scotland, M.D.  [address], [place] [address]   RE:  Matthew Wilkins, Matthew Wilkins  MRN:  [id number]  /  DOB:  1946-01-11   Dear Babette Relic:   I had the pleasure of seeing Breanna Mcdaniel in the office today in a  followup visit.  He has had some very mild chest discomfort, but not  frequent.  He continues to be modestly short of breath and smoke about  half a pack of cigarettes per day.  He also puffs on some cigars.  He is  quite anxious about the so called tumor on his kidney, and he is  scheduled to have a followup visit apparently with Urology in about a  week.  As you know, his radionuclide imaging study was not necessarily a  high-risk study, being interpreted as a low-risk study.  Nonetheless, I  am concerned about the potential for balanced ischemia, as he has had  both ER visits, and has a fairly apparent exercise tolerance.  Some of  this could be from underlying pulmonary disease, but is virtually  impossible to tell, and with not only his diabetes, but the smoking  history is at high risk.   PHYSICAL EXAMINATION:  Today, on examination, the weight was 186 pounds,  blood pressure 140/76, the pulse 62.  He had a Parkinson's shuffle.  The  lung fields reveal prolonged expiration without definite rales, but  clearly with some rhonchi, the cardiac rhythm reveals distant heart  sounds.  Extremities reveal trace edema.   His electrocardiogram today demonstrates sinus rhythm was within normal  limits.   Overall, Mr. Bargar has not had an acute cardiac event.  Clearly his  symptoms are worrisome.  Fortunately, has a low-risk study which would  imply a good prognosis, but he could well have balanced ischemia.  I do  think the anatomy would be quite helpful in his ongoing management.  This would even include incorporating the BARI 2-D strategy, which was  dependent upon  underlying knowledge of the coronary anatomy.  We obviously have talked  to him  about smoking, but have been fairly unsuccessful despite a fair  amount of counseling.  He will see them in a week and I will see him  back in a few weeks after that to reassess and discuss further cardiac  evaluation.  I appreciate the opportunity of sharing in his care.    Sincerely,      [name]. Riley Kill, MD, [facility]  Electronically Signed    TDS/MedQ  DD: 02/12/2008  DT: 02/12/2008  Job #: [id number]

## 2010-09-28 NOTE — Discharge Summary (Signed)
NAME:  Matthew Wilkins, Matthew Wilkins NO.:  [id number]   MEDICAL RECORD NO.:  [id number]          PATIENT TYPE:  INP   LOCATION:  2506                         FACILITY:  [facility]   PHYSICIAN:  [name]. Riley Kill, MD, FACCDATE OF BIRTH:  07-31-1945   DATE OF ADMISSION:  10/15/2008  DATE OF DISCHARGE:  10/16/2008                               DISCHARGE SUMMARY   PRIMARY CARDIOLOGIST:  Maisie Fus D. Riley Kill, MD, [facility]   PRIMARY CARE PHYSICIAN:  Tammy R. Collins Scotland, MD   DISCHARGE DIAGNOSIS:  Coronary artery disease, moderate high-grade  ostial stenosis of the left anterior descending, by intravascular  ultrasound and fractional flow reversed, does not appear flow limiting.   SECONDARY DIAGNOSES:  1. Type 2 diabetes mellitus.  2. Ongoing tobacco abuse disorder.  3. History of cerebrovascular disease.  4. Hypercholesterolemia.  5. Hypertension.  6. Parkinson disease.   ALLERGIES:  ASPIRIN (nose bleeds).   PROCEDURE PERFORMED DURING THIS HOSPITALIZATION:  EKG on October 15, 2008,  showing normal sinus rhythm at a rate of 67.  No acute ST-T wave  changes.  No significant Q waves.  Normal axis.  No evidence of  hypertrophy.  Intervals within normal limits.  1. Cardiac catheterization performed on October 15, 2008, showing      moderately high-grade ostial stenosis of the left anterior      descending artery, by intravascular ultrasound, fractional flow      reserve, does not appear to be flow limiting.  2. EKG showing sinus bradycardia with a rate at 54, otherwise no      significant changes from prior tracing.   HISTORY OF PRESENT ILLNESS:  Mr. Colpitts is a 65 year old male with long  history of smoking, COPD, and diabetes mellitus.  Diagnostic cardiac  catheterization showed calcified left main and probable 70-80% ostial  stenosis of LAD.  It was difficult to determine how severe this truly  worse, however.  He had segmental plaque in the mid circumflex which was  thought to be of importance,  but probably not hemodynamically  significant.  Various options were discussed and I (Dr. Riley Kill) was  concerned about his symptoms.  He continued to smoke intermittently and  has no plan of quitting.  Based on the findings, I will recommend repeat  cardiac catheterization with fractional flow reserve and intravascular  ultrasound as well, possibly.  Risks and benefits are explained, the  patient is agreeable to proceed.   HOSPITAL COURSE:  The patient is admitted and underwent procedures as  described above.  Consensus after long discussion with collogues  Verne Carrow and Tonny Bollman) is for medication management.  The patient received cardiac rehab phase 1 as well as smoking cessation  counseling while inpatient.  Vital signs remained stable during hospital  course and the patient was deemed stable for discharge on October 16, 2008.  The patient received his medication list, prescription for  nitroglycerin, followup instructions, and post-cath instructions at the  time of discharge.  All of his questions or concerns have been  addressed.   DISCHARGE LABORATORY DATA:  WBC 8.9, HGB is 14.9, HCT is 43.0,  and PLT  count is 143.  Glucose ranged from 118-162.  Sodium 140, potassium 4.0,  chloride 115, CO2 of 20, BUN 10, creatinine 0.95, glucose 162, and  calcium 8.5.   FOLLOWUP PLANS AND APPOINTMENTS:  Wende Bushy, October 29, 2008, at 8:45  a.m.   DISCHARGE MEDICATIONS:  1. Zetia 10 mg p.o. daily.  2. Protonix 40 mg p.o. daily.  3. Levothyroxine 25 mg p.o. daily.  4. Meloxicam 15 mg p.o. daily (the patient urged to use sparingly).  5. Benzonatate 20 mg p.r.n.  6. Metformin 1 g p.o. b.i.d. (to be restarted on October 17, 2008.)  7. Morphine 15 mg p.o. daily.  8. Plavix 75 mg p.o. daily.  9. Comtan 200 mg p.o. t.i.d.  10.Xanax 0.25 mg p.o. b.i.d. p.r.n. for anxiety.  11.Ropinirole 3 mg p.o. q.i.d.  12.Topamax 100 mg p.o. b.i.d.  13.Crestor 10 mg p.o. nightly.   14.Amitriptyline 50 mg p.o. daily.  15.Vitamin B12 daily.  16.Tizanidine hydrochloride 4 mg p.o. t.i.d.  17.Carbidopa and levodopa 5/200 mg p.o. t.i.d.  18.Gabapentin 300 mg p.o. b.i.d.   Duration of discharge encounter including physician time was 35 minutes.      Jarrett Ables, PAC      [name]. Riley Kill, MD, [facility]  Electronically Signed    MS/MEDQ  D:  10/16/2008  T:  10/16/2008  Job:  [id number]   cc:   Wende Bushy, [facility]

## 2010-09-28 NOTE — Cardiovascular Report (Signed)
NAME:  Matthew Wilkins, Matthew Wilkins NO.:  [id number]   MEDICAL RECORD NO.:  [id number]          PATIENT TYPE:  OIB   LOCATION:  1963                         FACILITY:  [facility]   PHYSICIAN:  [name]. Riley Kill, MD, FACCDATE OF BIRTH:  [birth year]   DATE OF PROCEDURE:  09/04/2008  DATE OF DISCHARGE:  09/04/2008                            CARDIAC CATHETERIZATION   Matthew Wilkins is a 65 year old gentleman who presents with recurrent  episodes of shortness of breath.  He is a diabetic, who continues to  smoke.  He has a mildly abnormal radionuclide imaging study, and we have  been considering cardiac catheterization for some time.  He was finally  agreeable to proceed.  Risks, benefits, and alternatives were discussed  in detail with the patient and he consented.   PROCEDURES:  1. Left heart catheterization.  2. Selective coronary arteriography.  3. Selective left ventriculography.   DESCRIPTION OF THE PROCEDURE:  The patient was brought to the  catheterization laboratory and prepped and draped in the usual fashion.  Through an anterior puncture, the femoral artery was easily entered and  a 4-French sheath was placed.  Following this, multiple views of left  coronary artery were obtained, specifically to try to lay out the  ostium, which has an ostial stenosis.  A 3DRC catheter was used to  engage the right coronary artery.  Central aortic and left ventricular  pressures were measured with pigtail.  Ventriculography was performed in  the RAO projection.  He tolerated the procedure well.  The patient did  not want any sedation.  At the completion of procedure, I showed the  pictures to the patient, then subsequently to his wife.  The sheath was  removed, and he was taken to the holding area in satisfactory clinical  condition.   HEMODYNAMIC DATA:  1. Central aortic pressure was 151/72, mean 102.  2. Left ventricular pressure 180/22.  3. No gradient or pullback across the aortic  valve.   ANGIOGRAPHIC DATA:  1. On plain fluoroscopy, there was fairly extensive calcification of      the coronary arteries.  2. The left main is free of critical disease.  It trifurcates into a      small intermediate and larger circumflex and LAD.  3. The LAD courses to the apex.  There are 2 moderate diagonal      branches and several septal perforators.  The ostium of the LAD has      an eccentric wedge-like defect in the proximal vessel resulting in      reduction in lumen diameter, probably 60-70%.  It potentially could      be tighter than this or less tight, and multiple views were done to      try to lay this out best.  The remainder LAD coursed to the apex      and had luminal irregularities but no critical stenoses.  4. The circumflex provides a moderate-sized first marginal, and then a      smaller second and third marginals.  Leading into the 2 smaller      marginals and into  the larger marginal, there is a diffuse plaque      of about 40-50%.  It does not appear to be high grade or flow      limiting.  The distal vessel is of moderate size, and a marginal is      not critically diseased.  The other 2 vessels are relatively small      in caliber.  5. The right coronary artery is a moderate-sized vessel with moderate      calcification.  There is an eccentric plaque in the mid vessel of      about 40%, posterolateral branch has 20-30% narrowing.  There is      mild luminal irregularity elsewhere.  6. Ventriculography in the RAO projection reveals ejection fraction in      excess of 55%.  There is 1+ mitral regurgitation.   CONCLUSION:  1. Preserved overall left ventricular function with trace mitral      regurgitation.  2. A 60-70% ostial stenosis of the left anterior descending artery.  3. Other scattered irregularities as noted above.   DISPOSITION:  I plan to see the patient back in the office next week.  We will discuss all other options.  He generally prefers a  conservative  course of management.  The LAD defect on radionuclide imaging is not a  large defect involving the entire anterior wall, which one would  expected.  Options at this point include either continued medical  therapy and/or intravascular ultrasound of the LAD.  It is not a good  place for a stent.  Importantly, the patient has been encouraged to stop  smoking.  His wife has also had bypass surgery, is a patient of mine,  and we have tried to encourage both of them in great detail.  Options  have been discussed.      [name]. Riley Kill, MD, [facility]  Electronically Signed     TDS/MEDQ  D:  09/04/2008  T:  09/04/2008  Job:  [id number]   cc:   Tammy R. Collins Scotland, M.D.

## 2010-09-28 NOTE — Assessment & Plan Note (Signed)
[facility]                            CARDIOLOGY OFFICE NOTE   PRESTEN, JOOST                     MRN:          [id number]  DATE:12/11/2007                            DOB:          January 27, 1946    Matthew Wilkins is seen today in followup.  He underwent Myoview imaging.  Interestingly enough, even after I saw him, he subsequently was seen in  the emergency room.  Dr. Greta Doom ordered his x-ray.  He had  borderline cardiomegaly and bibasilar atelectasis or scarring.  This was  done in large part because of his symptoms.  He says at times he turns  red, gets very hot and short of breath.  He also unfortunately continues  to smoke cigarettes.  This is an ongoing continuing habit, and he has  not been able to reduce the amount.  We talked about further evaluation  today.  His myocardial perfusion imaging studies were reviewed with Dr.  Jens Som today.  There was a very tiny defect out in the anterior tip,  this was thought to be either soft tissue attenuation or a small prior  anteroseptal infarct without obvious ischemia.  He had no significant ST-  segment changes during the infusion.  He had normal contractility and  thickening in all areas of the myocardium and the overall LV function  was normal.  The patient is diabetic.  His ejection fraction was 58% by  radionuclide imaging study.  Despite all of that, my original  recommendation to him was to consider a full evaluation including  cardiac catheterization.  He seems to be somewhat fixated on the  presence of a possible kidney tumor, I am not sure he understands all of  this very well.  I spoke with him at great length today, and he prefers  not to have any further evaluation.  The patient also takes a fair  amount of morphine 3 times a day.   Review of his recent EKG at the time of his myocardial perfusion study  today does reveal a completely normal tracing.   On physical examination, he  has a somewhat raspy voice.  He has  expiratory rhonchi on exam.  His cardiac rhythm is regular.  He does not  have extremity edema.   I have pretty extensive discussion with Matthew Wilkins today.  My leaning  would be in the direction of full evaluation of his symptoms, but this  is also a counter balance by the fact that he continues to smoke  heavily.  I do think further evaluation might be in some respects very  helpful.  We plan to see him back in followup, but should he have  intervening symptoms again I told him that he should come promptly to  the hospital, and that further evaluation be commenced.     Matthew Wilkins. Matthew Kill, MD, [facility]  Electronically Signed    TDS/MedQ  DD: 12/12/2007  DT: 12/12/2007  Job #: [id number]   cc:   Matthew Wilkins, M.D.

## 2010-09-28 NOTE — Letter (Signed)
March 20, 2008    Tammy R. Collins Scotland, MD  [address] [place] [address]   RE:  [name], [name]  MRN:  [id number]  /  DOB:  11-30-1945   Dear Babette Relic:   I had the pleasure seeing Matthew Wilkins in the office today in a  followup visit.  He seems to be stable.  I do not have any of the  results of this information, but he says he did have a followup on his  kidney and they told him to just watch it for now.  He also tells me  that he was noted to be hypothyroid, then she started him on thyroid  hormone.  He does continue to smoke about half a pack of cigarettes a  day.  I had a long discussion with he and his wife today, and I  encouraged them to set a quit date.  In addition, I discussed with them  pharmacologic options with regard to discontinuation of smoking and  obviously is important for both of them, specifically because of his  potential risks, and as you know, his wife is a patient who has had  bypass surgery.  Both would benefit from discontinuation of tobacco use.   His current medications are complicated, enlarged due to the number of  things that are being treated, they include,  1. Comtan 200 mg t.i.d.  2. Alprazolam 0.25 mg b.i.d.  3. AcipHex 20 mg daily.  4. Ropinirole hydrochloride 3 mg four times daily.  5. Topamax 100 mg daily.  6. Crestor 10 mg daily.  7. Benzonatate 200 mg p.r.n.  8. Metformin hydrochloride 500 mg b.i.d.  9. Morphine daily.  10.Amitriptyline 50 mg 2 nightly.  11.Vitamin B12 2000 mg daily.  12.Tizanidine hydrochloride 4 mg daily.  13.Plavix 75 mg daily.  14.Carbidopa levadopa 50/200 t.i.d.  15.Zetia 10 mg daily.  16.Gabapentin 300 mg b.i.d.  17.Protonix 40 mg daily.  18.Meloxicam 15 mg daily.  19.Levothyroxine 25 mcg daily.   Cardiac-wise, he is stable.  The blood pressure 118/70, the pulse is 68.  There is some decrease breath sounds with prolonged expiration  compatible with underlying COPD clinically.   In summary, this  gentleman has been stable.  He has not had recurrent  symptoms.  We will see him back in followup in 3 months' time or I would  be happy to see him earlier.  Once again I did reiterate the patient has  had a quit date and try to quit together, they have talked about the  possibility of using patches.  Thanks for allowing me to share in his  care.    Sincerely,      [name]. Riley Kill, MD, [facility]  Electronically Signed    TDS/MedQ  DD: 03/20/2008  DT: 03/21/2008  Job #: [id number]

## 2010-09-28 NOTE — Cardiovascular Report (Signed)
NAME:  Matthew Wilkins, Matthew Wilkins NO.:  [id number]   MEDICAL RECORD NO.:  [id number]          PATIENT TYPE:  INP   LOCATION:  2506                         FACILITY:  [facility]   PHYSICIAN:  Matthew Morton. Riley Kill, MD, FACCDATE OF BIRTH:  [birth year]   DATE OF PROCEDURE:  10/15/2008  DATE OF DISCHARGE:                            CARDIAC CATHETERIZATION   INDICATIONS:  [name] is a 65 year old who has a long history of  smoking.  He also has COPD and diabetes mellitus.  At diagnostic  catheterization, he had a calcified left main and a probable 70-80%  ostial stenosis of the LAD, but it was difficult to determine how severe  this truly was.  He had segmental plaque in the mid circumflex which was  thought to be of importance, but probably not hemodynamically  significant.  We discussed the various options and I have been concerned  about his symptoms.  He has continued to intermittently smoke.  Based on  the findings, we recommended coming in for a repeat catheterization with  fractional flow reserve and intravascular ultrasound possibly as well.  Risks, benefits, and alternatives were discussed with the patient.  He  was agreeable to proceed.   PROCEDURES:  1. Placement of catheters without left heart catheterization.  2. Fractional flow reserve.  3. Intravascular ultrasound.   DESCRIPTION OF THE PROCEDURE:  The patient was brought to the  catheterization laboratory and prepped and draped in the usual fashion.  Through an anterior puncture, the femoral artery was easily entered and  a 6-French sheath was placed.  Bivalirudin was given according to  protocol.  Following this, views of the left coronary artery were  obtained.  ACT was checked and found to be appropriate for intervention.  The patient has a history of ASPIRIN allergy and therefore was not  treated with aspirin.  He has been on Plavix.  The [place] wire was then  placed down the LAD, and doses of 40 and then  subsequently 50-60 mcg of  adenosine were administered.  With this administration, FFR was  calculated and was found to be 0.94 in both cases.  As a result, I  elected to perform intravascular ultrasound.  This was also performed  after intracoronary nitroglycerin administration without complication.  I then had Dr. Excell Wilkins and Dr. Clifton Wilkins come to the laboratory where we  reviewed the findings.   ANGIOGRAPHIC DATA:  1. The left main has calcification, mild luminal irregularity, but      noncritical disease.  2. The LAD has a lesion of probably 70-80% that is calcified and      eccentric in the ostium.  The distal left anterior descending      artery has diffuse luminal irregularity compatible with diabetes.      The diagonal also has similar findings.  The circumflex artery has      diffuse 40% narrowing leading into a moderate size marginal branch,      but it does not appear to be flow limiting.  Fractional flow      reserve was performed after 40 mcg of adenosine and also  50 mcg of      adenosine, in both cases it was measured at 0.94.  3. Intravascular ultrasound was performed after intracoronary      nitroglycerin administration.  The reference vessel demonstrated a      lumen of approximately 3.5 x nearly 3.5 with moderate plaque.  The      ostium leading into the left main does have an eccentric calcified      shelf, but at the most severe point where the left main comes in,      it is probably 3 mm x 2 mm.  This corresponds and correlates to      some extent with the findings of FFR.   CONCLUSION:  Moderately high-grade ostial stenosis of the left anterior  descending artery, which by intravascular ultrasound and fractional flow  reserve, does not appear to be flow limiting.   DISPOSITION:  My biggest concern is the patient has diabetes and  continues to smoke.  His risk of acute coronary syndrome remains  increased.  We will try to do our best to try to change this behavior,  I  have reviewed the case with Dr. Excell Wilkins Dr. Clifton Wilkins and leaning at the  present time towards medical therapy.      Matthew Morton. Riley Kill, MD, [facility]  Electronically Signed     TDS/MEDQ  D:  10/15/2008  T:  10/16/2008  Job:  [id number]   cc:   Matthew Wilkins, M.D.  [facility]

## 2010-09-28 NOTE — Assessment & Plan Note (Signed)
[facility]                            CARDIOLOGY OFFICE NOTE   Matthew Wilkins, Matthew Wilkins                     MRN:          [id number]  DATE:11/30/2007                            DOB:          12/01/1945    REFERRING PHYSICIAN:  Tammy R. Collins Scotland, M.D.   CHIEF COMPLAINT:  Shortness of breath with exertion.   HISTORY OF PRESENT ILLNESS:  Matthew Wilkins is a 65 year old gentleman who  comes in for evaluation.  He is not quite sure why he is here, however,  in taking history the patient does say that he is fairly limited and  gets short of breath when he does much.  He has a fair amount of stress.  He works in the garden and at times he does breakout into sweat.  He has  had a type 2 diabetes for about a year.  In March of this year, he went  to the emergency room with some hurting in the chest.  A EKG at that  time was apparently normal.  He has had some arthritis, and he has had  Parkinson's disease for about 9 years.  He is retired and he has not  smoked in the last 6-7 years and thinks that he has some sort of tumor  on the kidney.  In reviewing the notes from Dr. Alda Berthold office, he was  seen a while back.  This was in March of this year.  At that time, a  suggestion was made about a referral for shortness of breath with  exertion that was thought to be either pulmonary or cardiac.  In  reviewing the emergency room notes, the patient did present in late  March to the Emergency Room.  His hemoglobin was 17, hematocrit 49.  The  patient refused admission at that time.  His BUN and creatinine were  satisfactory and his chest x-ray revealed minimal linear atelectasis.   He has not had a definite chest discomfort.  He has not had chest  discomfort since that time.  He does continue to smoke about a pack of  cigarettes daily and does on occasion have some mild dizziness.   The patient has a history of diabetes and anxiety.  He has not had prior  surgery.  He  does have hypercholesterolemia, diabetes mellitus, and  apparently a prior CVA.   Medications include:  1. Gabapentin 300 mg p.o. b.i.d.  2. Comtan 200 mg t.i.d.  3. Alprazolam 0.25 b.i.d.  4. Aciphex 20 mg daily.  5. Ropinirole 3 mg 4 times daily.  6. Topamax 100 mg b.i.d.  7. Crestor 10 mg daily.  8. Benzonatate 200 mg p.r.n.  9. Metformin hydrochloride 500 mg b.i.d.  10.Morphine 3 times daily.  11.Amitriptyline 50 mg 2 at bedtime.  12.Vitamin B12 daily.  13.Tizanidine hydrochloride 4 mg 3 times daily.  14.Plavix 75 mg daily.  15.Carbidopa and levodopa 5/200 t.i.d.  16.Zetia 10 mg daily.   His mother died at 21 of heart problems.  His Father is unknown.  His  brother has had nervous legs.   SOCIAL HISTORY:  The patient  smokes about a pack of cigarettes daily.  He is widowed.  He is retired.   The review of systems is remarkable for some arthritis and anxiety.  He  has had a skin cancer.  He does complain of some fatigue.  He has not  had GI bleeding.  The review of systems is otherwise unremarkable.   The electrocardiogram demonstrates normal sinus rhythm essentially  within normal limits.   OVERALL IMPRESSION:  1. History of dyspnea on efforts of uncertain etiology, question      ischemic equivalent.  2. History of cerebrovascular disease previously.  3. Hypercholesterolemia on lipid-lowering therapy.  4. Type 2 diabetes mellitus.  5. Hypertension.  6. Continued tobacco use.  7. Parkinson's disease.   RECOMMENDATIONS:  We had a thorough discussion about the various ways to  evaluate this.  This could be done with cardiac catheterization given  his findings.  He is a diabetic and is a smoker.  However, he does not  feel that is necessary, but is agreeable to having a radionuclide  imaging study.  We will see him back and get that done and see him back  on the same day to review the results.  In the interim, if he has any  problems, he is to call us.     Matthew Wilkins. Riley Kill, MD, [facility]  Electronically Signed    TDS/MedQ  DD: 11/30/2007  DT: 12/01/2007  Job #: [id number]   cc:   Tammy R. Collins Scotland, M.D.

## 2010-12-08 ENCOUNTER — Encounter: Payer: Self-pay | Admitting: Cardiology

## 2011-01-10 ENCOUNTER — Ambulatory Visit (INDEPENDENT_AMBULATORY_CARE_PROVIDER_SITE_OTHER): Payer: Medicare Other | Admitting: Cardiology

## 2011-01-10 ENCOUNTER — Encounter: Payer: Self-pay | Admitting: Cardiology

## 2011-01-10 DIAGNOSIS — E785 Hyperlipidemia, unspecified: Secondary | ICD-10-CM

## 2011-01-10 DIAGNOSIS — J4489 Other specified chronic obstructive pulmonary disease: Secondary | ICD-10-CM

## 2011-01-10 DIAGNOSIS — F17200 Nicotine dependence, unspecified, uncomplicated: Secondary | ICD-10-CM

## 2011-01-10 DIAGNOSIS — J449 Chronic obstructive pulmonary disease, unspecified: Secondary | ICD-10-CM

## 2011-01-10 DIAGNOSIS — I739 Peripheral vascular disease, unspecified: Secondary | ICD-10-CM

## 2011-01-10 DIAGNOSIS — I10 Essential (primary) hypertension: Secondary | ICD-10-CM

## 2011-01-10 DIAGNOSIS — E7800 Pure hypercholesterolemia, unspecified: Secondary | ICD-10-CM

## 2011-01-10 DIAGNOSIS — I2510 Atherosclerotic heart disease of native coronary artery without angina pectoris: Secondary | ICD-10-CM

## 2011-01-10 NOTE — Assessment & Plan Note (Signed)
Continues to smoke at the present time.  No real change in status.

## 2011-01-10 NOTE — Assessment & Plan Note (Signed)
Has not eaten.  Will recheck lipid and liver profile.

## 2011-01-10 NOTE — Assessment & Plan Note (Signed)
No current symptoms.  Had fairly high grade stenosis of the LAD and had FFR which did not meet criteria for intervention.  He is not having further symptoms.  He is diabetic and smokes, and is at risk.  He understands this.  Currently will continue on medical therapy.

## 2011-01-10 NOTE — Progress Notes (Signed)
HPI:  Patient is in for follow up.  He is generally stable, and denies chest pain.  He continues to smoke.  He has CAD, and has been treated medically.  He is otherwise stable.  He had peripheral intervention done by Dr. Allyson Sabal, but the pain in his legs have not really changed.    Current Outpatient Prescriptions  Medication Sig Dispense Refill  . ALPRAZolam (XANAX) 0.25 MG tablet Take 0.25 mg by mouth 2 (two) times daily.        . benzonatate (TESSALON) 200 MG capsule Take 200 mg by mouth as needed.        . carbidopa-levodopa (SINEMET CR) 50-200 MG per tablet Take 1 tablet by mouth 2 (two) times daily.        . clopidogrel (PLAVIX) 75 MG tablet Take 75 mg by mouth daily.        . cyanocobalamin 2000 MCG tablet Take 2,000 mcg by mouth daily.        . entacapone (COMTAN) 200 MG tablet Take 200 mg by mouth 3 (three) times daily.        [name] ezetimibe (ZETIA) 10 MG tablet Take 10 mg by mouth daily.        [name] gabapentin (NEURONTIN) 300 MG capsule Take 300 mg by mouth 2 (two) times daily.        [name] levothyroxine (SYNTHROID, LEVOTHROID) 25 MCG tablet Take 25 mcg by mouth daily.        . meloxicam (MOBIC) 15 MG tablet Take 15 mg by mouth daily.        . nitroGLYCERIN (NITROSTAT) 0.4 MG SL tablet Place 0.4 mg under the tongue every 5 (five) minutes as needed.        . pantoprazole (PROTONIX) 40 MG tablet Take 40 mg by mouth daily.        [name] rOPINIRole (REQUIP) 3 MG tablet Take 3 mg by mouth 4 (four) times daily.        . rosuvastatin (CRESTOR) 20 MG tablet Take 20 mg by mouth daily.        [name] tiZANidine (ZANAFLEX) 4 MG tablet Take 4 mg by mouth 3 (three) times daily.        [name] topiramate (TOPAMAX) 100 MG tablet Take 100 mg by mouth 2 (two) times daily.          Allergies  Allergen Reactions  . Aspirin     Past Medical History  Diagnosis Date  . Diabetes mellitus   . Hypertension   . Coronary artery disease   . Hypercholesterolemia   . SOB (shortness of breath)   . Anxiety   . Parkinson's disease     . Arthritis     No past surgical history on file.  Family History  Problem Relation Age of Onset  . Restless legs syndrome Brother     History   Social History  . Marital Status: Married    Spouse Name: N/A    Number of Children: N/A  . Years of Education: N/A   Occupational History  . Not on file.   Social History Main Topics  . Smoking status: Current Everyday Smoker -- 1.0 packs/day    Types: Cigarettes  . Smokeless tobacco: Not on file  . Alcohol Use:   . Drug Use:   . Sexually Active:    Other Topics Concern  . Not on file   Social History Narrative  . No narrative on file    ROS: Please see the HPI.  All other systems reviewed and negative.  PHYSICAL EXAM:  BP 137/76  Pulse 72  Ht 5\' 9"  (1.753 m)  Wt 190 lb 6.4 oz (86.365 kg)  BMI 28.12 kg/m2  General: Well developed, well nourished, in no acute distress. Head:  Normocephalic and atraumatic. Neck: no JVD Lungs: Clear to auscultation and percussion. Heart: Normal S1 and S2.  No murmur, rubs or gallops.  Abdomen:  Decrease breath sounds bilaterally with prolonged expiration.   Pulses:  Diminished pulses in the lower extremities.   Extremities: No clubbing or cyanosis. No edema. Neurologic: Alert and oriented x 3.  EKG:  NSR with sinus arrhythmia.  No acute changes.    ASSESSMENT AND PLAN:

## 2011-01-10 NOTE — Patient Instructions (Signed)
Your physician recommends that you schedule a follow-up appointment in: 6 months  

## 2011-01-10 NOTE — Assessment & Plan Note (Addendum)
Diminished pulses bilaterally.  Still symptomatic.  Is supposed to see Dr. Allyson Sabal, but unable to see because he did not have the copay.  He does have pulses bilaterally, albeit diminished.  Role of smoking discussed on progression, and continued symptoms.   Will defer any further studies and action to Dr. Collins Scotland and Dr. Allyson Sabal.

## 2011-01-10 NOTE — Assessment & Plan Note (Signed)
Currently under control

## 2011-01-10 NOTE — Assessment & Plan Note (Signed)
Discussed impact on risk of ACS, and progression of PVD.

## 2011-01-11 LAB — HEPATIC FUNCTION PANEL
AST: 24 U/L (ref 0–37)
Albumin: 4.2 g/dL (ref 3.5–5.2)
Alkaline Phosphatase: 71 U/L (ref 39–117)
Total Protein: 7.3 g/dL (ref 6.0–8.3)

## 2011-01-11 LAB — LIPID PANEL: LDL Cholesterol: 101 mg/dL — ABNORMAL HIGH (ref 0–99)

## 2011-02-07 LAB — CBC
HCT: 49.4
Hemoglobin: 17.2 — ABNORMAL HIGH
MCHC: 34.8
MCV: 88.8
RDW: 13.6

## 2011-02-07 LAB — DIFFERENTIAL
Basophils Absolute: 0.1
Basophils Relative: 1
Eosinophils Absolute: 0.1
Eosinophils Relative: 1
Lymphocytes Relative: 22
Lymphs Abs: 3
Monocytes Absolute: 0.9
Monocytes Relative: 7
Neutro Abs: 9.2 — ABNORMAL HIGH
Neutrophils Relative %: 69

## 2011-02-07 LAB — BASIC METABOLIC PANEL
BUN: 12
Chloride: 105
Glucose, Bld: 102 — ABNORMAL HIGH
Potassium: 4

## 2011-02-10 LAB — BASIC METABOLIC PANEL
CO2: 28
Calcium: 9.6
Creatinine, Ser: 1.13
Glucose, Bld: 120 — ABNORMAL HIGH
Potassium: 3.9

## 2011-02-10 LAB — CBC
Hemoglobin: 16.9
MCV: 90.8
RBC: 5.48
WBC: 12.1 — ABNORMAL HIGH

## 2011-02-10 LAB — DIFFERENTIAL
Lymphs Abs: 2.9
Monocytes Relative: 6
Neutro Abs: 8.3 — ABNORMAL HIGH
Neutrophils Relative %: 68

## 2011-02-10 LAB — URINALYSIS, ROUTINE W REFLEX MICROSCOPIC
Bilirubin Urine: NEGATIVE
Hgb urine dipstick: NEGATIVE
Ketones, ur: NEGATIVE
Nitrite: NEGATIVE
Urobilinogen, UA: 0.2

## 2011-02-10 LAB — POCT CARDIAC MARKERS
Myoglobin, poc: 78.8
Operator id: 217151
Troponin i, poc: <0.05

## 2011-02-10 LAB — RAPID URINE DRUG SCREEN, HOSP PERFORMED: Barbiturates: NOT DETECTED

## 2011-03-16 ENCOUNTER — Ambulatory Visit: Payer: Medicare Other | Admitting: Cardiology

## 2011-04-11 ENCOUNTER — Encounter: Payer: Self-pay | Admitting: Cardiology

## 2011-04-11 ENCOUNTER — Ambulatory Visit (INDEPENDENT_AMBULATORY_CARE_PROVIDER_SITE_OTHER): Payer: Medicare Other | Admitting: Cardiology

## 2011-04-11 DIAGNOSIS — I2510 Atherosclerotic heart disease of native coronary artery without angina pectoris: Secondary | ICD-10-CM

## 2011-04-11 DIAGNOSIS — I739 Peripheral vascular disease, unspecified: Secondary | ICD-10-CM

## 2011-04-11 DIAGNOSIS — I10 Essential (primary) hypertension: Secondary | ICD-10-CM

## 2011-04-11 MED ORDER — NITROGLYCERIN 0.4 MG SL SUBL: 0.4000 mg | SUBLINGUAL_TABLET | SUBLINGUAL | Status: DC | PRN

## 2011-04-11 NOTE — Patient Instructions (Signed)
You have been referred to Dr Earney Hamburg for PVD.  Your physician has requested that you have a lower extremity arterial duplex. This test is an ultrasound of the arteries in the legs. It looks at arterial blood flow in the legs. Allow one hour for Lower Arterial scans. There are no restrictions or special instructions  Your physician has requested that you have an ankle brachial index (ABI). During this test an ultrasound and blood pressure cuff are used to evaluate the arteries that supply the arms and legs with blood. Allow thirty minutes for this exam. There are no restrictions or special instructions.  Your physician wants you to follow-up in: 6 MONTHS with Dr Riley Kill.  You will receive a reminder letter in the mail two months in advance. If you don't receive a letter, please call our office to schedule the follow-up appointment.  Your physician recommends that you continue on your current medications as directed. Please refer to the Current Medication list given to you today.

## 2011-04-19 ENCOUNTER — Telehealth: Payer: Self-pay | Admitting: Cardiology

## 2011-04-19 MED ORDER — NITROGLYCERIN 0.4 MG SL SUBL: 0.4000 mg | SUBLINGUAL_TABLET | SUBLINGUAL | Status: DC | PRN

## 2011-04-19 NOTE — Telephone Encounter (Signed)
Rx sent to [facility] 

## 2011-04-19 NOTE — Telephone Encounter (Signed)
Pt needs nitro sent to [facility] [phone]

## 2011-05-10 NOTE — Assessment & Plan Note (Signed)
Some borderline control.  Not currently on BP meds, but will need to be watched.

## 2011-05-10 NOTE — Assessment & Plan Note (Signed)
No current symptoms.  See my last cath.  As I have mentioned to him, likely only a matter of time until problems develop.  He understands the need for better control of some of his risk factors.

## 2011-05-10 NOTE — Assessment & Plan Note (Addendum)
Will refer to Dr. Clifton James for long term follow up.  Notably, he had R SFA intervention less than one year ago.  His pulses are diminished.  Has reconstituted SFA on the left, with slightly better pulses on the right,and small area of erythema distally on left.  Will get ABI and dopplers, and refer to as noted..  Of note, he needs to stop smoking--his wife has resumed in the setting of severe bronchitis, and CABG, so the prospects do not seem very good. Walking and smoking cessation seem like the best long term bets to avoid LOL.

## 2011-05-10 NOTE — Progress Notes (Signed)
HPI:  Referred back here for evaluation of PVD.  Has seen Dr. Allyson Sabal, but does not want to go back.  Apparently has been seen fairly recently.  He is able to walk in the garden, and also on soft dirt.  His legs do bother him, however.  Continues to smoke, and we have been unable to change that.    Current Outpatient Prescriptions  Medication Sig Dispense Refill  . ALPRAZolam (XANAX) 0.25 MG tablet Take 0.25 mg by mouth 2 (two) times daily.        . benzonatate (TESSALON) 200 MG capsule Take 200 mg by mouth as needed.        . carbidopa-levodopa (SINEMET CR) 50-200 MG per tablet Take 1 tablet by mouth 2 (two) times daily.        . clopidogrel (PLAVIX) 75 MG tablet Take 75 mg by mouth daily.        . cyanocobalamin 2000 MCG tablet Take 2,000 mcg by mouth daily.        . entacapone (COMTAN) 200 MG tablet Take 200 mg by mouth 3 (three) times daily.        [name] ezetimibe (ZETIA) 10 MG tablet Take 10 mg by mouth daily.        [name] gabapentin (NEURONTIN) 300 MG capsule Take 300 mg by mouth 2 (two) times daily.        [name] levothyroxine (SYNTHROID, LEVOTHROID) 25 MCG tablet Take 25 mcg by mouth daily.        . meloxicam (MOBIC) 15 MG tablet Take 15 mg by mouth daily.        . pantoprazole (PROTONIX) 40 MG tablet Take 40 mg by mouth daily.        [name] rOPINIRole (REQUIP) 3 MG tablet Take 3 mg by mouth 4 (four) times daily.        . rosuvastatin (CRESTOR) 20 MG tablet Take 20 mg by mouth daily.        [name] tiZANidine (ZANAFLEX) 4 MG tablet Take 4 mg by mouth 3 (three) times daily.        [name] topiramate (TOPAMAX) 100 MG tablet Take 100 mg by mouth 2 (two) times daily.        . nitroGLYCERIN (NITROSTAT) 0.4 MG SL tablet Place 1 tablet (0.4 mg total) under the tongue every 5 (five) minutes as needed.  25 tablet  2    Allergies  Allergen Reactions  . Aspirin     Past Medical History  Diagnosis Date  . Diabetes mellitus   . Hypertension   . Coronary artery disease   . Hypercholesterolemia   . SOB (shortness of  breath)   . Anxiety   . Parkinson's disease   . Arthritis     No past surgical history on file.  Family History  Problem Relation Age of Onset  . Restless legs syndrome Brother     History   Social History  . Marital Status: Married    Spouse Name: N/A    Number of Children: N/A  . Years of Education: N/A   Occupational History  . Not on file.   Social History Main Topics  . Smoking status: Current Everyday Smoker -- 1.0 packs/day    Types: Cigarettes  . Smokeless tobacco: Not on file  . Alcohol Use:   . Drug Use:   . Sexually Active:    Other Topics Concern  . Not on file   Social History Narrative  . No narrative on file  ROS: Please see the HPI.  All other systems reviewed and negative.  PHYSICAL EXAM:  BP 150/68  Pulse 71  Ht 5\' 9"  (1.753 m)  Wt 87.907 kg (193 lb 12.8 oz)  BMI 28.62 kg/m2  General: Well developed, well nourished, somewhat older gentleman in no acute distress. Head:  Normocephalic and atraumatic. Neck: no JVD Lungs: Prolonged expiration with slight ronchii.  Heart: Normal S1 and S2.  No murmur, rubs or gallops.  Abdomen:  Normal bowel sounds; soft; non tender; no organomegaly Pulses: Bifemoral bruits.  ? Reduced popliteal.  LLE pulses greater than RLE pulses.   Extremities: No clubbing or cyanosis. No edema.  See notes from Dr. Oneita Kras on left foot with small area of surrounding erythema.  Not broken down.   Neurologic: Alert and oriented x 3.  EKG:  NSR.  No acute changes.    ASSESSMENT AND PLAN:

## 2011-05-13 ENCOUNTER — Encounter: Payer: Medicare Other | Admitting: *Deleted

## 2011-05-25 ENCOUNTER — Ambulatory Visit: Payer: Medicare Other | Admitting: Cardiovascular Disease

## 2011-05-30 ENCOUNTER — Encounter (INDEPENDENT_AMBULATORY_CARE_PROVIDER_SITE_OTHER): Payer: Medicare Other | Admitting: Cardiology

## 2011-05-30 DIAGNOSIS — I739 Peripheral vascular disease, unspecified: Secondary | ICD-10-CM

## 2011-05-30 DIAGNOSIS — I70219 Atherosclerosis of native arteries of extremities with intermittent claudication, unspecified extremity: Secondary | ICD-10-CM

## 2011-06-14 ENCOUNTER — Ambulatory Visit: Payer: Medicare Other | Admitting: Cardiovascular Disease

## 2011-06-30 ENCOUNTER — Encounter: Payer: Self-pay | Admitting: Cardiovascular Disease

## 2011-06-30 ENCOUNTER — Ambulatory Visit (INDEPENDENT_AMBULATORY_CARE_PROVIDER_SITE_OTHER): Payer: Medicare Other | Admitting: Cardiovascular Disease

## 2011-06-30 ENCOUNTER — Encounter: Payer: Self-pay | Admitting: *Deleted

## 2011-06-30 VITALS — BP 150/76 | HR 73 | Ht 69.0 in | Wt 195.0 lb

## 2011-06-30 DIAGNOSIS — I779 Disorder of arteries and arterioles, unspecified: Secondary | ICD-10-CM

## 2011-06-30 DIAGNOSIS — I739 Peripheral vascular disease, unspecified: Secondary | ICD-10-CM

## 2011-06-30 DIAGNOSIS — Z79899 Other long term (current) drug therapy: Secondary | ICD-10-CM

## 2011-06-30 LAB — CBC WITH DIFFERENTIAL/PLATELET
Basophils Absolute: 0.1 10*3/uL (ref 0.0–0.1)
Eosinophils Relative: 1.3 % (ref 0.0–5.0)
HCT: 46.4 % (ref 39.0–52.0)
Lymphs Abs: 2.2 10*3/uL (ref 0.7–4.0)
MCV: 88.3 fl (ref 78.0–100.0)
Monocytes Absolute: 0.4 10*3/uL (ref 0.1–1.0)
Platelets: 174 10*3/uL (ref 150.0–400.0)
RDW: 13.8 % (ref 11.5–14.6)

## 2011-06-30 LAB — BASIC METABOLIC PANEL
BUN: 15 mg/dL (ref 6–23)
CO2: 23 mEq/L (ref 19–32)
Chloride: 105 mEq/L (ref 96–112)
Glucose, Bld: 153 mg/dL — ABNORMAL HIGH (ref 70–99)
Potassium: 3.9 mEq/L (ref 3.5–5.1)

## 2011-06-30 LAB — PROTIME-INR: Prothrombin Time: 10.1 s — ABNORMAL LOW (ref 10.2–12.4)

## 2011-06-30 NOTE — Assessment & Plan Note (Addendum)
He has severe PAD, likely occlusion of right SFA stent and known CTO of the left SFA by angiogram April 2012 (per Dr. Allyson Sabal). He has lifestyle limiting claudication but no tissue loss. I have discussed his disease and potential treatment plans. An angiogram of the distal aorta with lower extremity runoff would define his current anatomy. He agrees to proceed. He understands that we may not be able to offer percutaneous therapy. He may need bypass or just medical management. Will arrange for distal aortogram with Bilateral LE runoff on 07/13/11. Risks and benefits reviewed. Smoking cessation recommended. Smoking will contribute to rapid progression of his disease.

## 2011-06-30 NOTE — Patient Instructions (Signed)
Your physician recommends that you schedule a follow-up appointment in: 4-6 weeks.   Your physician has requested that you have a peripheral vascular angiogram. This exam is performed at the hospital. During this exam IV contrast is used to look at arterial blood flow. Please review the information sheet given for details. Scheduled for Feb. 27,2013

## 2011-06-30 NOTE — Progress Notes (Signed)
History of Present Illness: 66 yo WM with history of DM, HTN, CAD, HLD, Parkinson's Disease, ongoing tobacco abuse and PAD who is referred to see me today for PV evaluation. His cardiac issues are followed by Dr. Riley Kill. He has most recently seen Dr. Riley Kill in November 2012 and his cardiac issues are stable. His PV issues have been followed in the [facility] by Dr. Allyson Sabal. The patient no longer wishes to see Dr. Allyson Sabal. In April of 2012, he had ABI of 0.73 on the right and 0.84 on the left. Dr. Allyson Sabal arranged a distal aortogram with bilateral LE runoff. The patient described life-style limiting claudication at that time. He was found to have a CTO of the left SFA with reconstituion by collaterals. He also had a severe 99% proximal right SFA stenosis treated with a self expanding stent. (7 x 40 Abbott   Absolute Pro nitinol self-expanding stent.  Postdilatation was performed  with a 6 x 4 balloon at 2 atmospheres). Dr. Riley Kill saw him in f/u in November 2012 and arranged repeat non-invasive imaging. Lower ext arterial dopplers on 05/30/11 with ABI of 0.63 on the right and 0.64 on the left. There is elevated velocities in the R SFA suggesting a severe stenosis, likely in the area of the stent.   He tells me today that he can walk 10 feet before his legs begin to ache. The pain is the same in both legs. NO severe rest pain in the legs. No ulcerations. He had no resolution of symptoms after his right SFA stent last year. He continues to smoke daily and has no desire to stop.    Primary Care Physician:  Last Lipid Profile:  Past Medical History  Diagnosis Date  . Diabetes mellitus   . Hypertension   . Coronary artery disease   . Hypercholesterolemia   . SOB (shortness of breath)   . Anxiety   . Parkinson's disease   . Arthritis     No past surgical history on file.  Current Outpatient Prescriptions  Medication Sig Dispense Refill  . ALPRAZolam (XANAX) 0.25 MG  tablet Take 0.25 mg by mouth 2 (two) times daily.        . benzonatate (TESSALON) 200 MG capsule Take 200 mg by mouth as needed.        . carbidopa-levodopa (SINEMET CR) 50-200 MG per tablet Take 1 tablet by mouth 2 (two) times daily.        . clopidogrel (PLAVIX) 75 MG tablet Take 75 mg by mouth daily.        . cyanocobalamin 2000 MCG tablet Take 2,000 mcg by mouth daily.        . entacapone (COMTAN) 200 MG tablet Take 200 mg by mouth 3 (three) times daily.        [name] ezetimibe (ZETIA) 10 MG tablet Take 10 mg by mouth daily.        [name] gabapentin (NEURONTIN) 300 MG capsule Take 300 mg by mouth 2 (two) times daily.        [name] levothyroxine (SYNTHROID, LEVOTHROID) 25 MCG tablet Take 25 mcg by mouth daily.        . meloxicam (MOBIC) 15 MG tablet Take 15 mg by mouth daily.        . nitroGLYCERIN (NITROSTAT) 0.4 MG SL tablet Place 1 tablet (0.4 mg total) under the tongue every 5 (five) minutes as needed.  25 tablet  2  . pantoprazole (PROTONIX) 40 MG tablet Take 40  mg by mouth daily.        [name] rOPINIRole (REQUIP) 3 MG tablet Take 3 mg by mouth 4 (four) times daily.        . rosuvastatin (CRESTOR) 20 MG tablet Take 20 mg by mouth daily.        [name] tiZANidine (ZANAFLEX) 4 MG tablet Take 4 mg by mouth 3 (three) times daily.        [name] topiramate (TOPAMAX) 100 MG tablet Take 100 mg by mouth 2 (two) times daily.          Allergies  Allergen Reactions  . Aspirin     History   Social History  . Marital Status: Married    Spouse Name: N/A    Number of Children: N/A  . Years of Education: N/A   Occupational History  . Not on file.   Social History Main Topics  . Smoking status: Current Everyday Smoker -- 1.0 packs/day    Types: Cigarettes  . Smokeless tobacco: Not on file  . Alcohol Use:   . Drug Use:   . Sexually Active:    Other Topics Concern  . Not on file   Social History Narrative  . No narrative on file    Family History  Problem Relation Age of Onset  . Restless legs syndrome  Brother     Review of Systems:  As stated in the HPI and otherwise negative.   There were no vitals taken for this visit.  Physical Examination: General: Well developed, well nourished, NAD HEENT: OP clear, mucus membranes moist SKIN: warm, dry. No rashes. Neuro: No focal deficits Musculoskeletal: Muscle strength 5/5 all ext Psychiatric: Mood and affect normal Neck: No JVD, no carotid bruits, no thyromegaly, no lymphadenopathy. Lungs:Clear bilaterally, no wheezes, rhonci, crackles Cardiovascular: Regular rate and rhythm. No murmurs, gallops or rubs. Abdomen:Soft. Bowel sounds present. Non-tender.  Extremities: No lower extremity edema. Pulses are 2 + in the bilateral DP/PT.  EKG:

## 2011-07-01 ENCOUNTER — Encounter (HOSPITAL_COMMUNITY): Payer: Self-pay | Admitting: Pharmacy Technician

## 2011-07-13 ENCOUNTER — Encounter (HOSPITAL_COMMUNITY)
Admission: RE | Disposition: A | Discharge status: Home / Self Care | Payer: Self-pay | Source: Ambulatory Visit | Attending: Cardiovascular Disease

## 2011-07-13 ENCOUNTER — Ambulatory Visit (HOSPITAL_COMMUNITY)
Admission: RE | Admit: 2011-07-13 | Discharge: 2011-07-13 | Disposition: A | Discharge status: Home / Self Care | Payer: Medicare Other | Source: Ambulatory Visit | Attending: Cardiovascular Disease | Admitting: Cardiovascular Disease

## 2011-07-13 DIAGNOSIS — Y831 Surgical operation with implant of artificial internal device as the cause of abnormal reaction of the patient, or of later complication, without mention of misadventure at the time of the procedure: Secondary | ICD-10-CM | POA: Insufficient documentation

## 2011-07-13 DIAGNOSIS — G20A1 Parkinson's disease without dyskinesia, without mention of fluctuations: Secondary | ICD-10-CM | POA: Insufficient documentation

## 2011-07-13 DIAGNOSIS — F17200 Nicotine dependence, unspecified, uncomplicated: Secondary | ICD-10-CM | POA: Insufficient documentation

## 2011-07-13 DIAGNOSIS — I70219 Atherosclerosis of native arteries of extremities with intermittent claudication, unspecified extremity: Secondary | ICD-10-CM

## 2011-07-13 DIAGNOSIS — I10 Essential (primary) hypertension: Secondary | ICD-10-CM | POA: Insufficient documentation

## 2011-07-13 DIAGNOSIS — I2510 Atherosclerotic heart disease of native coronary artery without angina pectoris: Secondary | ICD-10-CM | POA: Insufficient documentation

## 2011-07-13 DIAGNOSIS — E785 Hyperlipidemia, unspecified: Secondary | ICD-10-CM | POA: Insufficient documentation

## 2011-07-13 DIAGNOSIS — T82898A Other specified complication of vascular prosthetic devices, implants and grafts, initial encounter: Secondary | ICD-10-CM | POA: Insufficient documentation

## 2011-07-13 DIAGNOSIS — I70209 Unspecified atherosclerosis of native arteries of extremities, unspecified extremity: Secondary | ICD-10-CM | POA: Insufficient documentation

## 2011-07-13 DIAGNOSIS — G20 Parkinson's disease: Secondary | ICD-10-CM | POA: Insufficient documentation

## 2011-07-13 DIAGNOSIS — I739 Peripheral vascular disease, unspecified: Secondary | ICD-10-CM

## 2011-07-13 DIAGNOSIS — E119 Type 2 diabetes mellitus without complications: Secondary | ICD-10-CM | POA: Insufficient documentation

## 2011-07-13 HISTORY — PX: LOWER EXTREMITY ANGIOGRAM: SHX5508

## 2011-07-13 HISTORY — PX: PERCUTANEOUS STENT INTERVENTION: SHX5500

## 2011-07-13 LAB — GLUCOSE, CAPILLARY: Glucose-Capillary: 212 mg/dL — ABNORMAL HIGH (ref 70–99)

## 2011-07-13 SURGERY — ANGIOGRAM, LOWER EXTREMITY
Anesthesia: LOCAL | Laterality: Right

## 2011-07-13 MED ORDER — MIDAZOLAM HCL 2 MG/2ML IJ SOLN
INTRAMUSCULAR | Status: AC
  Filled 2011-07-13: qty 2

## 2011-07-13 MED ORDER — SODIUM CHLORIDE 0.9 % IJ SOLN: 3.0000 mL | Freq: Two times a day (BID) | INTRAMUSCULAR | Status: DC

## 2011-07-13 MED ORDER — ASPIRIN 81 MG PO CHEW
CHEWABLE_TABLET | ORAL | Status: AC
  Filled 2011-07-13: qty 4

## 2011-07-13 MED ORDER — SODIUM CHLORIDE 0.9 % IJ SOLN: 3.0000 mL | INTRAMUSCULAR | Status: DC | PRN

## 2011-07-13 MED ORDER — FENTANYL CITRATE 0.05 MG/ML IJ SOLN
INTRAMUSCULAR | Status: AC
  Filled 2011-07-13: qty 2

## 2011-07-13 MED ORDER — ACETAMINOPHEN 325 MG PO TABS: 650.0000 mg | ORAL_TABLET | ORAL | Status: DC | PRN

## 2011-07-13 MED ORDER — DIAZEPAM 5 MG PO TABS
5.0000 mg | ORAL_TABLET | ORAL | Status: AC
  Administered 2011-07-13: 5 mg via ORAL

## 2011-07-13 MED ORDER — HEPARIN SODIUM (PORCINE) 1000 UNIT/ML IJ SOLN
INTRAMUSCULAR | Status: AC
  Filled 2011-07-13: qty 1

## 2011-07-13 MED ORDER — SODIUM CHLORIDE 0.9 % IV SOLN: 250.0000 mL | INTRAVENOUS | Status: DC | PRN

## 2011-07-13 MED ORDER — ONDANSETRON HCL 4 MG/2ML IJ SOLN: 4.0000 mg | Freq: Four times a day (QID) | INTRAMUSCULAR | Status: DC | PRN

## 2011-07-13 MED ORDER — ASPIRIN 81 MG PO CHEW
324.0000 mg | CHEWABLE_TABLET | ORAL | Status: AC
  Administered 2011-07-13: 324 mg via ORAL

## 2011-07-13 MED ORDER — SODIUM CHLORIDE 0.9 % IV SOLN
INTRAVENOUS | Status: DC
  Administered 2011-07-13: 75 mL/h via INTRAVENOUS

## 2011-07-13 MED ORDER — DIAZEPAM 5 MG PO TABS
ORAL_TABLET | ORAL | Status: AC
  Filled 2011-07-13: qty 1

## 2011-07-13 MED ORDER — SODIUM CHLORIDE 0.9 % IV SOLN: INTRAVENOUS | Status: AC

## 2011-07-13 MED ORDER — LIDOCAINE HCL (PF) 1 % IJ SOLN
INTRAMUSCULAR | Status: AC
  Filled 2011-07-13: qty 30

## 2011-07-13 MED ORDER — HEPARIN (PORCINE) IN NACL 2-0.9 UNIT/ML-% IJ SOLN
INTRAMUSCULAR | Status: AC
  Filled 2011-07-13: qty 1000

## 2011-07-13 NOTE — Interval H&P Note (Signed)
History and Physical Interval Note:  07/13/2011 8:18 AM  Matthew Wilkins  has presented today for distal aortogram with LE runoff and possible PTA with the diagnosis of Claudication and PAD.  The various methods of treatment have been discussed with the patient and family. After consideration of risks, benefits and other options for treatment, the patient has consented to  Procedure(s) (LRB): LOWER EXTREMITY ANGIOGRAM (N/A) as a surgical intervention .  The patients' history has been reviewed, patient examined, no change in status, stable for surgery.  I have reviewed the patients' chart and labs.  Questions were answered to the patient's satisfaction.     [name]

## 2011-07-13 NOTE — Discharge Instructions (Signed)

## 2011-07-13 NOTE — H&P (View-Only) (Signed)
 History of Present Illness: 66 yo WM with history of DM, HTN, CAD, HLD, Parkinson's Disease, ongoing tobacco abuse and PAD who is referred to see me today for PV evaluation. His cardiac issues are followed by Dr. Stuckey. He has most recently seen Dr. Stuckey in November 2012 and his cardiac issues are stable. His PV issues have been followed in the [facility] by Dr. Berry. The patient no longer wishes to see Dr. Berry. In April of 2012, he had ABI of 0.73 on the right and 0.84 on the left. Dr. Berry arranged a distal aortogram with bilateral LE runoff. The patient described life-style limiting claudication at that time. He was found to have a CTO of the left SFA with reconstituion by collaterals. He also had a severe 99% proximal right SFA stenosis treated with a self expanding stent. (7 x 40 Abbott   Absolute Pro nitinol self-expanding stent.  Postdilatation was performed  with a 6 x 4 balloon at 2 atmospheres). Dr. Stuckey saw him in f/u in November 2012 and arranged repeat non-invasive imaging. Lower ext arterial dopplers on 05/30/11 with ABI of 0.63 on the right and 0.64 on the left. There is elevated velocities in the R SFA suggesting a severe stenosis, likely in the area of the stent.   He tells me today that he can walk 10 feet before his legs begin to ache. The pain is the same in both legs. NO severe rest pain in the legs. No ulcerations. He had no resolution of symptoms after his right SFA stent last year. He continues to smoke daily and has no desire to stop.    Primary Care Physician:  Last Lipid Profile:  Past Medical History  Diagnosis Date  . Diabetes mellitus   . Hypertension   . Coronary artery disease   . Hypercholesterolemia   . SOB (shortness of breath)   . Anxiety   . Parkinson's disease   . Arthritis     No past surgical history on file.  Current Outpatient Prescriptions  Medication Sig Dispense Refill  . ALPRAZolam (XANAX) 0.25 MG  tablet Take 0.25 mg by mouth 2 (two) times daily.        . benzonatate (TESSALON) 200 MG capsule Take 200 mg by mouth as needed.        . carbidopa-levodopa (SINEMET CR) 50-200 MG per tablet Take 1 tablet by mouth 2 (two) times daily.        . clopidogrel (PLAVIX) 75 MG tablet Take 75 mg by mouth daily.        . cyanocobalamin 2000 MCG tablet Take 2,000 mcg by mouth daily.        . entacapone (COMTAN) 200 MG tablet Take 200 mg by mouth 3 (three) times daily.        . ezetimibe (ZETIA) 10 MG tablet Take 10 mg by mouth daily.        . gabapentin (NEURONTIN) 300 MG capsule Take 300 mg by mouth 2 (two) times daily.        . levothyroxine (SYNTHROID, LEVOTHROID) 25 MCG tablet Take 25 mcg by mouth daily.        . meloxicam (MOBIC) 15 MG tablet Take 15 mg by mouth daily.        . nitroGLYCERIN (NITROSTAT) 0.4 MG SL tablet Place 1 tablet (0.4 mg total) under the tongue every 5 (five) minutes as needed.  25 tablet  2  . pantoprazole (PROTONIX) 40 MG tablet Take 40   mg by mouth daily.        . rOPINIRole (REQUIP) 3 MG tablet Take 3 mg by mouth 4 (four) times daily.        . rosuvastatin (CRESTOR) 20 MG tablet Take 20 mg by mouth daily.        . tiZANidine (ZANAFLEX) 4 MG tablet Take 4 mg by mouth 3 (three) times daily.        . topiramate (TOPAMAX) 100 MG tablet Take 100 mg by mouth 2 (two) times daily.          Allergies  Allergen Reactions  . Aspirin     History   Social History  . Marital Status: Married    Spouse Name: N/A    Number of Children: N/A  . Years of Education: N/A   Occupational History  . Not on file.   Social History Main Topics  . Smoking status: Current Everyday Smoker -- 1.0 packs/day    Types: Cigarettes  . Smokeless tobacco: Not on file  . Alcohol Use:   . Drug Use:   . Sexually Active:    Other Topics Concern  . Not on file   Social History Narrative  . No narrative on file    Family History  Problem Relation Age of Onset  . Restless legs syndrome  Brother     Review of Systems:  As stated in the HPI and otherwise negative.   There were no vitals taken for this visit.  Physical Examination: General: Well developed, well nourished, NAD HEENT: OP clear, mucus membranes moist SKIN: warm, dry. No rashes. Neuro: No focal deficits Musculoskeletal: Muscle strength 5/5 all ext Psychiatric: Mood and affect normal Neck: No JVD, no carotid bruits, no thyromegaly, no lymphadenopathy. Lungs:Clear bilaterally, no wheezes, rhonci, crackles Cardiovascular: Regular rate and rhythm. No murmurs, gallops or rubs. Abdomen:Soft. Bowel sounds present. Non-tender.  Extremities: No lower extremity edema. Pulses are 2 + in the bilateral DP/PT.  EKG: 

## 2011-07-13 NOTE — Op Note (Signed)
Peripheral Vascular Procedure Report  LARENZO CAPLES [id number] 2/27/20139:50 AM Herb Grays, MD, MD  Procedure Performed:  1. Distal aortogram with bilateral lower extremity runoff.  2. PTA with placement of a 5.0 x 40 mm stent in the proximal right SFA  Operator: Verne Carrow, MD  Indication:  Severe PAD, pt with stent proximal right SFA per Dr. Allyson Sabal in 2012. Now with severe pain right leg with reduced ABI. Pt with known CTO of the left SFA with collaterals.                                      Procedure Details: The risks, benefits, complications, treatment options, and expected outcomes were discussed with the patient. The patient and/or family concurred with the proposed plan, giving informed consent. The patient was brought to the [facility] lab after IV hydration was begun and oral premedication was given. The patient was further sedated with Versed and Fentanyl. The left groin was prepped and draped in the usual manner. Using the modified Seldinger access technique, a 5 French sheath was placed in the left femoral artery. A pigtail catheter was used to perform a distal aortogram and then pulled back to perform angiography of the iliac vessels. We then performed runoff of both legs. The patient was found to have severe stenosis in the stent in the proximal right SFA. We then upsized to a 6 French sheath in the left femoral artery and used a crossover sheath. I then used the Versicore wire to cross the lesion in the proximal right SFA within the stent. 5000 units IV heparin was given. When the ACT was greater than 200, we used a 6.0 x 40 mm balloon to dilate the severe stenosis in the stent. There was residual disease on the distal edge of the stent. I then placed a 6.0 x 40 mm SMART Cordis self expanding stent was placed in an overlapping fashion with the previously placed stent. I then post-dilated with a 5.0 x 40 mm balloon and then hit the overlap with this balloon. There was an  excellent result. The stenosis was taken from 95% within the stent to 0% and the stenosis beyond the stent was taken from 80% down to 0%.   There were no immediate complications. The patient was taken to the recovery area in stable condition.   Hemodynamic Findings: Central aortic pressure: 163/78  Angiographic Findings:  Both renal arteries are patent.   The distal aorta has mild plaque but no aneurysm or severe stenosis.  The right common iliac artery is patent. The right external iliac artery has 40% diffuse stenosis. The right CFA is patent. The right SFA has a stent in the proximal segment with 95% in stent restenosis in the mid portion of the stent but diffuse disease throughout the stent. Just off of the distal edge of the stent is a 80% stenosis. There is 3 vessel runoff to the right foot.   The left common iliac artery is patent. The left external iliac artery has a 30% ulcerated plaque. The left CFA is patent. The left SFA is totally occluded proximally with reconstitution mid SFA via collaterals. There is 3 vessel runoff to the left foot.   Impression: 1. Severe disease in the previously placed stent in the proximal right SFA now s/p PTA/stent placement proximal right SFA. 2. CTO of the left SFA with collaterals, unchanged.    Recommendations:  He must stop smoking. He will most certainly have recurrence of restenosis if he keeps smoking. Continue ASA and Plavix.        Complications:  None. The patient tolerated the procedure well.

## 2011-07-14 ENCOUNTER — Ambulatory Visit (INDEPENDENT_AMBULATORY_CARE_PROVIDER_SITE_OTHER): Payer: Medicare Other | Admitting: Otolaryngology

## 2011-07-27 ENCOUNTER — Other Ambulatory Visit: Payer: Self-pay | Admitting: Cardiology

## 2011-07-27 DIAGNOSIS — I739 Peripheral vascular disease, unspecified: Secondary | ICD-10-CM

## 2011-08-08 ENCOUNTER — Encounter: Payer: Self-pay | Admitting: Cardiovascular Disease

## 2011-08-08 ENCOUNTER — Ambulatory Visit (INDEPENDENT_AMBULATORY_CARE_PROVIDER_SITE_OTHER): Payer: Medicare Other | Admitting: Cardiovascular Disease

## 2011-08-08 ENCOUNTER — Encounter (INDEPENDENT_AMBULATORY_CARE_PROVIDER_SITE_OTHER): Payer: Medicare Other

## 2011-08-08 VITALS — BP 168/88 | HR 80 | Ht 69.0 in | Wt 191.0 lb

## 2011-08-08 DIAGNOSIS — I70219 Atherosclerosis of native arteries of extremities with intermittent claudication, unspecified extremity: Secondary | ICD-10-CM

## 2011-08-08 DIAGNOSIS — I739 Peripheral vascular disease, unspecified: Secondary | ICD-10-CM

## 2011-08-08 DIAGNOSIS — I779 Disorder of arteries and arterioles, unspecified: Secondary | ICD-10-CM

## 2011-08-08 NOTE — Assessment & Plan Note (Addendum)
Stable PAD post PTA/stent right SFA. He has a CTO of the left SFA which is stable with stable ABI. He is trying to stop smoking. He and his wife are both curious about cessation methods. They have tried nicotine patches. They will try nicotine gum and lozenges. Complete tobacco cessatoin is encouraged. Repeat ABI in 6 months with PV follow up then. 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 We will discuss enrollment in [employer] research trial comparing Plavix with Brilinta in patients with PAD. We would need to stop ASA and Plavix for randomization into either arm.

## 2011-08-08 NOTE — Progress Notes (Signed)
History of Present Illness: 66 yo WM with history of DM, HTN, CAD, HLD, Parkinson's Disease, ongoing tobacco abuse and PAD who is here today for PV follow up. I saw him in February 2013 as a new PV patient.  His cardiac issues are followed by Dr. Riley Kill. He has most recently seen Dr. Riley Kill in November 2012 and his cardiac issues are stable. His PV issues have been followed in the [facility] by Dr. Allyson Sabal. The patient no longer wishes to see Dr. Allyson Sabal. In April of 2012, he had ABI of 0.73 on the right and 0.84 on the left. Dr. Allyson Sabal arranged a distal aortogram with bilateral LE runoff. The patient described life-style limiting claudication at that time. He was found to have a CTO of the left SFA with reconstituion by collaterals. He also had a severe 99% proximal right SFA stenosis treated with a self expanding stent. (7 x 40 Abbott Absolute Pro nitinol self-expanding stent. Postdilatation was performed with a 6 x 4 balloon at 2 atmospheres). Dr. Riley Kill saw him in f/u in November 2012 and arranged repeat non-invasive imaging. Lower ext arterial dopplers on 05/30/11 with ABI of 0.63 on the right and 0.64 on the left. There is elevated velocities in the R SFA suggesting a severe stenosis, likely in the area of the stent. I arranged a distal aortogram with bilateral lower extremity runoff on 07/13/11. His left SFA CTO was unchanged with good collaterals. His proximal right SFA stent had a 99% stenosis in the distal portion of the stented segment. I performed a balloon angioplasty and had an unfavorable result so a 6.0 x 40 mm SMART Cordis self expanding stent was placed in an overlapping fashion with the previously placed stent. I then post-dilated with a 5.0 x 40 mm balloon and then hit the overlap with this balloon. There was an excellent result. The stenosis was taken from 95% within the stent to 0% and the stenosis beyond the stent was taken from 80% down to 0%. He is here today for  follow up.   He tells me today that he has been doing well. His right leg feels better.   Non-invasive imaging today shows ABI of 0.84 on the right and 0.62 on the left (This is improved from 0.63 on the right and stable on the left).   Primary Care Physician: Herb Grays   Past Medical History  Diagnosis Date  . Diabetes mellitus   . Hypertension   . Coronary artery disease   . Hypercholesterolemia   . SOB (shortness of breath)   . Anxiety   . Parkinson's disease   . Arthritis   . PAD (peripheral artery disease)     No past surgical history on file.  Current Outpatient Prescriptions  Medication Sig Dispense Refill  . aspirin EC 325 MG tablet Take 325 mg by mouth daily.      . carbidopa-levodopa (SINEMET CR) 50-200 MG per tablet Take 1 tablet by mouth 3 (three) times daily.       . clopidogrel (PLAVIX) 75 MG tablet Take 75 mg by mouth daily.        [name] esomeprazole (NEXIUM) 40 MG capsule Take 40 mg by mouth daily before breakfast.      . ezetimibe (ZETIA) 10 MG tablet Take 10 mg by mouth every morning.       . gabapentin (NEURONTIN) 300 MG capsule Take 300 mg by mouth 2 (two) times daily.       [name]  glipiZIDE (GLUCOTROL XL) 2.5 MG 24 hr tablet Take 2.5 mg by mouth every morning.      [name] glipiZIDE (GLUCOTROL XL) 5 MG 24 hr tablet Take 5 mg by mouth every morning.      [name] levothyroxine (SYNTHROID, LEVOTHROID) 25 MCG tablet Take 25 mcg by mouth every morning.       . nitroGLYCERIN (NITROSTAT) 0.4 MG SL tablet Place 0.4 mg under the tongue every 5 (five) minutes as needed. For chest pain      . rOPINIRole (REQUIP) 3 MG tablet Take 3 mg by mouth 4 (four) times daily.        . rosuvastatin (CRESTOR) 20 MG tablet Take 20 mg by mouth every morning.       . tiotropium (SPIRIVA) 18 MCG inhalation capsule Place 18 mcg into inhaler and inhale daily.      . traMADol (ULTRAM) 50 MG tablet Take 50-100 mg by mouth 3 (three) times daily as needed. For pain        Allergies  Allergen Reactions  .  Aspirin Other (See Comments)    Nose bleeds     History   Social History  . Marital Status: Married    Spouse Name: N/A    Number of Children: N/A  . Years of Education: N/A   Occupational History  . Not on file.   Social History Main Topics  . Smoking status: Current Everyday Smoker -- 1.0 packs/day    Types: Cigarettes  . Smokeless tobacco: Not on file  . Alcohol Use:   . Drug Use:   . Sexually Active:    Other Topics Concern  . Not on file   Social History Narrative  . No narrative on file    Family History  Problem Relation Age of Onset  . Restless legs syndrome Brother     Review of Systems:  As stated in the HPI and otherwise negative.   BP 168/88  Pulse 80  Ht 5\' 9"  (1.753 m)  Wt 191 lb (86.637 kg)  BMI 28.21 kg/m2  Physical Examination: General: Well developed, well nourished, NAD HEENT: OP clear, mucus membranes moist SKIN: warm, dry. No rashes. Neuro: No focal deficits Musculoskeletal: Muscle strength 5/5 all ext Psychiatric: Mood and affect normal Neck: No JVD, no carotid bruits, no thyromegaly, no lymphadenopathy. Lungs:Clear bilaterally, no wheezes, rhonci, crackles Cardiovascular: Regular rate and rhythm. No murmurs, gallops or rubs. Abdomen:Soft. Bowel sounds present. Non-tender.  Extremities: No lower extremity edema. Pulses are non-palpable in the left DP/PT and 1+ in the right DP/PT.   Distal aortogram: 07/13/11: Both renal arteries are patent.  The distal aorta has mild plaque but no aneurysm or severe stenosis.  The right common iliac artery is patent. The right external iliac artery has 40% diffuse stenosis. The right CFA is patent. The right SFA has a stent in the proximal segment with 95% in stent restenosis in the mid portion of the stent but diffuse disease throughout the stent. Just off of the distal edge of the stent is a 80% stenosis. There is 3 vessel runoff to the right foot.  The left common iliac artery is patent. The left  external iliac artery has a 30% ulcerated plaque. The left CFA is patent. The left SFA is totally occluded proximally with reconstitution mid SFA via collaterals. There is 3 vessel runoff to the left foot.  Impression:  1. Severe disease in the previously placed stent in the proximal right SFA now s/p PTA/stent placement proximal  right SFA.  2. CTO of the left SFA with collaterals, unchanged.

## 2011-08-08 NOTE — Patient Instructions (Signed)
Your physician wants you to follow-up in: 6 months. You will receive a reminder letter in the mail two months in advance. If you don't receive a letter, please call our office to schedule the follow-up appointment.  Your physician has requested that you have an ankle brachial index (ABI). During this test an ultrasound and blood pressure cuff are used to evaluate the arteries that supply the arms and legs with blood. Allow thirty minutes for this exam. There are no restrictions or special instructions. To be done in 6 months.   

## 2011-08-30 ENCOUNTER — Ambulatory Visit (INDEPENDENT_AMBULATORY_CARE_PROVIDER_SITE_OTHER): Payer: Medicare Other

## 2011-08-30 ENCOUNTER — Other Ambulatory Visit: Payer: Self-pay | Admitting: *Deleted

## 2011-08-30 DIAGNOSIS — Z006 Encounter for examination for normal comparison and control in clinical research program: Secondary | ICD-10-CM | POA: Insufficient documentation

## 2011-08-30 DIAGNOSIS — I739 Peripheral vascular disease, unspecified: Secondary | ICD-10-CM

## 2011-08-30 DIAGNOSIS — I779 Disorder of arteries and arterioles, unspecified: Secondary | ICD-10-CM

## 2011-09-07 ENCOUNTER — Encounter: Payer: Self-pay | Admitting: *Deleted

## 2011-11-22 ENCOUNTER — Encounter: Payer: Self-pay | Admitting: Cardiology

## 2011-11-22 ENCOUNTER — Ambulatory Visit (INDEPENDENT_AMBULATORY_CARE_PROVIDER_SITE_OTHER): Payer: Medicare Other | Admitting: Cardiology

## 2011-11-22 VITALS — BP 132/74 | HR 74 | Ht 69.0 in | Wt 188.0 lb

## 2011-11-22 DIAGNOSIS — F17200 Nicotine dependence, unspecified, uncomplicated: Secondary | ICD-10-CM

## 2011-11-22 DIAGNOSIS — I2510 Atherosclerotic heart disease of native coronary artery without angina pectoris: Secondary | ICD-10-CM

## 2011-11-22 DIAGNOSIS — G459 Transient cerebral ischemic attack, unspecified: Secondary | ICD-10-CM

## 2011-11-22 DIAGNOSIS — I739 Peripheral vascular disease, unspecified: Secondary | ICD-10-CM

## 2011-11-22 NOTE — Patient Instructions (Signed)
Your physician has requested that you have a carotid duplex. This test is an ultrasound of the carotid arteries in your neck. It looks at blood flow through these arteries that supply the brain with blood. Allow one hour for this exam. There are no restrictions or special instructions.  Your physician wants you to follow-up in: 6 MONTHS with Dr Stuckey. You will receive a reminder letter in the mail two months in advance. If you don't receive a letter, please call our office to schedule the follow-up appointment.  Your physician recommends that you continue on your current medications as directed. Please refer to the Current Medication list given to you today.    

## 2011-11-27 DIAGNOSIS — G459 Transient cerebral ischemic attack, unspecified: Secondary | ICD-10-CM | POA: Insufficient documentation

## 2011-11-27 NOTE — Progress Notes (Signed)
HPI:  The patient returns in followup he notes that his legs bother him. He is followed in the PVD clinic here. He's also had some right shoulder pain, which she thinks is secondary to things related to his garden and sat means he recently had a brief episode with trouble talking he thought that he had a TIA or mini stroke, and he says that he has had about 15-20 these in the past. He unfortunately continues to smoke.  Current Outpatient Prescriptions  Medication Sig Dispense Refill  . carbidopa-levodopa (SINEMET CR) 50-200 MG per tablet Take 1 tablet by mouth 3 (three) times daily.       [name] esomeprazole (NEXIUM) 40 MG capsule Take 40 mg by mouth daily before breakfast.      . ezetimibe (ZETIA) 10 MG tablet Take 10 mg by mouth every morning.       . gabapentin (NEURONTIN) 300 MG capsule Take 300 mg by mouth 2 (two) times daily.       [name] glipiZIDE (GLUCOTROL XL) 2.5 MG 24 hr tablet Take 2.5 mg by mouth every morning.      [name] glipiZIDE (GLUCOTROL XL) 5 MG 24 hr tablet Take 5 mg by mouth every morning.      [name] levothyroxine (SYNTHROID, LEVOTHROID) 25 MCG tablet Take 25 mcg by mouth every morning.       . metFORMIN (GLUCOPHAGE) 500 MG tablet       . nitroGLYCERIN (NITROSTAT) 0.4 MG SL tablet Place 0.4 mg under the tongue every 5 (five) minutes as needed. For chest pain      . NON FORMULARY Take 1 tablet by mouth 2 (two) times daily.      [name] rOPINIRole (REQUIP) 3 MG tablet Take 3 mg by mouth 4 (four) times daily.        . rosuvastatin (CRESTOR) 20 MG tablet Take 20 mg by mouth every morning.       . tiotropium (SPIRIVA) 18 MCG inhalation capsule Place 18 mcg into inhaler and inhale daily.      . traMADol (ULTRAM) 50 MG tablet Take 50-100 mg by mouth 3 (three) times daily as needed. For pain        Allergies  Allergen Reactions  . Aspirin Other (See Comments)    Nose bleeds     Past Medical History  Diagnosis Date  . Diabetes mellitus   . Hypertension   . Coronary artery disease   .  Hypercholesterolemia   . SOB (shortness of breath)   . Anxiety   . Parkinson's disease   . Arthritis   . PAD (peripheral artery disease)     No past surgical history on file.  Family History  Problem Relation Age of Onset  . Restless legs syndrome Brother     History   Social History  . Marital Status: Married    Spouse Name: N/A    Number of Children: N/A  . Years of Education: N/A   Occupational History  . Not on file.   Social History Main Topics  . Smoking status: Current Everyday Smoker -- 1.0 packs/day    Types: Cigarettes  . Smokeless tobacco: Not on file  . Alcohol Use:   . Drug Use:   . Sexually Active:    Other Topics Concern  . Not on file   Social History Narrative  . No narrative on file    ROS: Please see the HPI.  All other systems reviewed and negative.  PHYSICAL EXAM:  BP  132/74  Pulse 74  Ht 5\' 9"  (1.753 m)  Wt 188 lb (85.276 kg)  BMI 27.76 kg/m2  General: Well developed, well nourished, in no acute distress. Head:  Normocephalic and atraumatic. Neck: no JVD.  I do not appreciate any bruits.   Lungs: Prolonged expiration. Slight ronchii Heart: Normal S1 and S2.  No murmur, rubs or gallops.  Abdomen:  Normal bowel sounds; soft; non tender; no organomegaly.  No abdominal bruit.   Pulses: Reduced in the LE  (.84 and .62).  Bifemoral bruits.   Extremities: No clubbing or cyanosis. No edema. Neurologic: Alert and oriented x 3.  No focal weakness or localizing signs. CNM intact.  Complete exam.     EKG:  NSR.  WNL.   CT SCAN   CT HEAD WITHOUT CONTRAST  Technique: Contiguous axial images were obtained from the base of the skull through the vertex without contrast.  Comparison: None  Findings: No acute intracranial abnormality. Specifically, no hemorrhage, hydrocephalus, mass lesion, acute infarction, or significant intracranial injury. No acute calvarial abnormality. Visualized paranasal sinuses and mastoids clear. Orbital  soft tissues unremarkable.  IMPRESSION: No acute intracranial abnormality.  Provider: Wilmer Floor, Ronnald Collum       Specimen Collected: 01/28/10 2:15 PM    ASSESSMENT AND PLAN:

## 2011-11-27 NOTE — Assessment & Plan Note (Addendum)
Weather this is a TIA or not is difficult to tell.  He says he has had many of these in the past.  No def carotid bruits, but we will go ahead and get carotid dopplers.  He does not have symptoms to suggest atrial fibrillation.  We will need to get a 2D echo as well.  Status of [place] trial to be checked.

## 2011-11-27 NOTE — Assessment & Plan Note (Signed)
He continues to smoke and understands the long term consequences.  We talk about this frequently.  He does not have any unstable symptoms at the present time.

## 2011-11-27 NOTE — Assessment & Plan Note (Signed)
Findings are consistent with sig PVD.  See notes of Dr. Clifton James.

## 2011-11-27 NOTE — Assessment & Plan Note (Signed)
See discussions of this with the patient in detail.

## 2011-11-29 ENCOUNTER — Encounter (INDEPENDENT_AMBULATORY_CARE_PROVIDER_SITE_OTHER): Payer: Medicare Other

## 2011-11-29 DIAGNOSIS — G459 Transient cerebral ischemic attack, unspecified: Secondary | ICD-10-CM

## 2011-11-29 DIAGNOSIS — I6529 Occlusion and stenosis of unspecified carotid artery: Secondary | ICD-10-CM

## 2011-12-16 ENCOUNTER — Telehealth: Payer: Self-pay | Admitting: Cardiovascular Disease

## 2011-12-16 DIAGNOSIS — I2510 Atherosclerotic heart disease of native coronary artery without angina pectoris: Secondary | ICD-10-CM

## 2011-12-16 NOTE — Telephone Encounter (Signed)
Please return call pt wife [phone] regarding pt test results.

## 2011-12-16 NOTE — Telephone Encounter (Signed)
Spoke with pt's wife and reviewed carotid doppler results and Dr.Stuckey's recommendation for 48 hour monitor. I told wife our office would contact them to set up appt to have monitor applied.

## 2011-12-20 ENCOUNTER — Telehealth: Payer: Self-pay

## 2012-01-05 ENCOUNTER — Encounter (INDEPENDENT_AMBULATORY_CARE_PROVIDER_SITE_OTHER): Payer: Medicare Other

## 2012-01-05 DIAGNOSIS — I2510 Atherosclerotic heart disease of native coronary artery without angina pectoris: Secondary | ICD-10-CM

## 2012-01-05 NOTE — Telephone Encounter (Signed)
Patient had monitor put on today. 

## 2012-01-12 ENCOUNTER — Telehealth: Payer: Self-pay | Admitting: Cardiology

## 2012-01-12 NOTE — Telephone Encounter (Signed)
Pt would results of 48hr monitor that he turned in on Monday

## 2012-01-12 NOTE — Telephone Encounter (Signed)
Pt is requesting a phone call when the results of his holter monitor have been reviewed by Dr Riley Kill.

## 2012-01-19 NOTE — Telephone Encounter (Signed)
Holter monitor results per Dr Riley Kill NRS with PAC's.  Min pulse 51, Max pulse 97, Average pulse 71.  I spoke with the pt's wife and gave her results of heart monitor.

## 2012-02-06 ENCOUNTER — Other Ambulatory Visit: Payer: Self-pay | Admitting: Cardiology

## 2012-02-06 DIAGNOSIS — I739 Peripheral vascular disease, unspecified: Secondary | ICD-10-CM

## 2012-02-07 ENCOUNTER — Telehealth: Payer: Self-pay | Admitting: *Deleted

## 2012-02-07 NOTE — Telephone Encounter (Signed)
Received call from Matthew Wilkins in research. Pt is scheduled for ABI's and appt with Dr. Clifton James on 9/25.  He is now in [employer] study and is scheduled for ABI's on October 8 and does not need ABI's tomorrow. I called pt and he is not having any problems with legs.  Appt with Dr. Clifton James changed to March 02, 2012 at 11:00. He is aware of research appts on October 8.

## 2012-02-08 ENCOUNTER — Ambulatory Visit: Payer: Medicare Other | Admitting: Cardiovascular Disease

## 2012-02-21 ENCOUNTER — Encounter (INDEPENDENT_AMBULATORY_CARE_PROVIDER_SITE_OTHER): Payer: Medicare Other

## 2012-02-21 DIAGNOSIS — I739 Peripheral vascular disease, unspecified: Secondary | ICD-10-CM

## 2012-02-21 DIAGNOSIS — R0989 Other specified symptoms and signs involving the circulatory and respiratory systems: Secondary | ICD-10-CM

## 2012-03-02 ENCOUNTER — Ambulatory Visit (INDEPENDENT_AMBULATORY_CARE_PROVIDER_SITE_OTHER): Payer: Medicare Other | Admitting: Cardiovascular Disease

## 2012-03-02 ENCOUNTER — Other Ambulatory Visit: Payer: Self-pay | Admitting: *Deleted

## 2012-03-02 ENCOUNTER — Encounter: Payer: Self-pay | Admitting: Cardiovascular Disease

## 2012-03-02 VITALS — BP 145/79 | HR 78 | Ht 69.0 in | Wt 190.0 lb

## 2012-03-02 DIAGNOSIS — I739 Peripheral vascular disease, unspecified: Secondary | ICD-10-CM

## 2012-03-02 MED ORDER — OXYCODONE-ACETAMINOPHEN 5-325 MG PO TABS: 1.0000 | ORAL_TABLET | Freq: Four times a day (QID) | ORAL | Status: DC | PRN

## 2012-03-02 NOTE — Progress Notes (Signed)
History of Present Illness: 66 yo WM with history of DM, HTN, CAD, HLD, Parkinson's Disease, ongoing tobacco abuse and PAD who is here today for PV follow up. I saw him in February 2013 as a new PV patient. His cardiac issues are followed by Dr. Riley Kill. He has most recently seen Dr. Riley Kill in July 2013 and his cardiac issues are stable. His PV issues had been followed in the [facility] by Dr. Allyson Sabal but he changed to our [facility] clinic in February 2013.  In April of 2012, he had ABI of 0.73 on the right and 0.84 on the left. Dr. Allyson Sabal arranged a distal aortogram with bilateral LE runoff. The patient described life-style limiting claudication at that time. He was found to have a CTO of the left SFA with reconstituion by collaterals. He also had a severe 99% proximal right SFA stenosis treated with a self expanding stent. (7 x 40 Abbott Absolute Pro nitinol self-expanding stent. Postdilatation was performed with a 6 x 4 balloon at 2 atmospheres). Dr. Riley Kill saw him in f/u in November 2012 and arranged repeat non-invasive imaging. Lower ext arterial dopplers on 05/30/11 with ABI of 0.63 on the right and 0.64 on the left. There were elevated velocities in the R SFA suggesting progression of disease in the area of the stent.  I arranged a distal aortogram with bilateral lower extremity runoff on 07/13/11. His left SFA CTO was unchanged with good collaterals. His proximal right SFA stent had a 99% stenosis in the distal portion of the stented segment. I performed a balloon angioplasty and had an unfavorable result so a 6.0 x 40 mm SMART Cordis self expanding stent was placed in an overlapping fashion with the previously placed stent. I then post-dilated with a 5.0 x 40 mm balloon and then hit the overlap with this balloon. There was an excellent result. The stenosis was taken from 95% within the stent to 0% and the stenosis beyond the stent was taken from 80% down to 0%. Non-invasive imaging  March 2013 with ABI of 0.84 on the right and 0.62 on the left which was improved from the pre-procedure study. Most recent ABI on 02/21/12 with ABI of 0.67 on the right and 0.78 on the left. The right ATA and PTA waveforms are biphasic. Left ATA and PTA waveforms are biphasic.   He is here today for PV follow up. He tells me that his legs hurt when he bends over and when he walks around the yard. No rest pain or ulcerations. He is still smoking and now dipping tobacco to try to slow down with the cigarettes. No big change in how his legs feel.   Primary Care Physician: Herb Grays    Past Medical History  Diagnosis Date  . Diabetes mellitus   . Hypertension   . Coronary artery disease   . Hypercholesterolemia   . SOB (shortness of breath)   . Anxiety   . Parkinson's disease   . Arthritis   . PAD (peripheral artery disease)     No past surgical history on file.  Current Outpatient Prescriptions  Medication Sig Dispense Refill  . carbidopa-levodopa (SINEMET CR) 50-200 MG per tablet Take 1 tablet by mouth 3 (three) times daily.       [name] esomeprazole (NEXIUM) 40 MG capsule Take 40 mg by mouth daily before breakfast.      . ezetimibe (ZETIA) 10 MG tablet Take 10 mg by mouth every morning.       [name]  gabapentin (NEURONTIN) 300 MG capsule Take 300 mg by mouth 2 (two) times daily.       [name] glipiZIDE (GLUCOTROL XL) 2.5 MG 24 hr tablet Take 2.5 mg by mouth every morning.      [name] glipiZIDE (GLUCOTROL XL) 5 MG 24 hr tablet Take 5 mg by mouth every morning.      [name] levothyroxine (SYNTHROID, LEVOTHROID) 25 MCG tablet Take 25 mcg by mouth every morning.       . metFORMIN (GLUCOPHAGE) 500 MG tablet       . nitroGLYCERIN (NITROSTAT) 0.4 MG SL tablet Place 0.4 mg under the tongue every 5 (five) minutes as needed. For chest pain      . NON FORMULARY Take 1 tablet by mouth 2 (two) times daily.      [name] rOPINIRole (REQUIP) 3 MG tablet Take 3 mg by mouth 4 (four) times daily.        . rosuvastatin (CRESTOR) 20  MG tablet Take 20 mg by mouth every morning.       . tiotropium (SPIRIVA) 18 MCG inhalation capsule Place 18 mcg into inhaler and inhale daily.      . traMADol (ULTRAM) 50 MG tablet Take 50-100 mg by mouth 3 (three) times daily as needed. For pain        Allergies  Allergen Reactions  . Aspirin Other (See Comments)    Nose bleeds     History   Social History  . Marital Status: Married    Spouse Name: N/A    Number of Children: N/A  . Years of Education: N/A   Occupational History  . Not on file.   Social History Main Topics  . Smoking status: Current Every Day Smoker -- 1.0 packs/day    Types: Cigarettes  . Smokeless tobacco: Not on file  . Alcohol Use:   . Drug Use:   . Sexually Active:    Other Topics Concern  . Not on file   Social History Narrative  . No narrative on file    Family History  Problem Relation Age of Onset  . Restless legs syndrome Brother     Review of Systems:  As stated in the HPI and otherwise negative.   BP 145/79  Pulse 78  Ht 5\' 9"  (1.753 m)  Wt 190 lb (86.183 kg)  BMI 28.06 kg/m2  Physical Examination: General: Well developed, well nourished, NAD HEENT: OP clear, mucus membranes moist SKIN: warm, dry. No rashes. Neuro: No focal deficits Musculoskeletal: Muscle strength 5/5 all ext Psychiatric: Mood and affect normal Neck: No JVD, no carotid bruits, no thyromegaly, no lymphadenopathy. Lungs:Clear bilaterally, no wheezes, rhonci, crackles Cardiovascular: Regular rate and rhythm. No murmurs, gallops or rubs. Abdomen:Soft. Bowel sounds present. Non-tender.  Extremities: No lower extremity edema. Pulses are trace in the bilateral DP/PT.  Lower extremity arterial dopplers: 02/21/12: Right ABI 0.67, Left ABI 0.78.   Assessment and Plan:   1. PAD:  He is enrolled in [facility] and randomized to either Plavix or Brilinta. His leg pain is stable. He is able to work in his garden and yard. No rest pain or ulcerations. Known to have a CTO  of the left SFA which is stable with stable ABI by doppler last week. Right ABI is slightly reduced but no change in symptoms. He continues to smoke which he understands is jeopardizing the circulation in his legs. I do not think repeat invasive imaging is necessary at this time unless his clinical picture changes.  I  will give him Percocet to use prn for pain.  Repeat ABI in 6 months with PV follow up then.  2. Tobacco Abuse:  Smoking cessation advised.

## 2012-03-02 NOTE — Patient Instructions (Addendum)
Your physician wants you to follow-up in: 6 months. You will receive a reminder letter in the mail two months in advance. If you don't receive a letter, please call our office to schedule the follow-up appointment.  Your physician has requested that you have an ankle brachial index (ABI). During this test an ultrasound and blood pressure cuff are used to evaluate the arteries that supply the arms and legs with blood. Allow thirty minutes for this exam. There are no restrictions or special instructions.To be done in 6 months on day of appt with Dr. McAlhany      

## 2012-03-06 ENCOUNTER — Ambulatory Visit: Payer: Medicare Other | Admitting: Cardiovascular Disease

## 2012-06-06 ENCOUNTER — Ambulatory Visit: Payer: Medicare Other | Admitting: Cardiology

## 2012-07-06 ENCOUNTER — Ambulatory Visit (INDEPENDENT_AMBULATORY_CARE_PROVIDER_SITE_OTHER): Payer: Medicare Other | Admitting: Cardiology

## 2012-07-06 ENCOUNTER — Encounter: Payer: Self-pay | Admitting: Cardiology

## 2012-07-06 VITALS — BP 124/70 | HR 69 | Ht 69.0 in | Wt 189.0 lb

## 2012-07-06 DIAGNOSIS — I2510 Atherosclerotic heart disease of native coronary artery without angina pectoris: Secondary | ICD-10-CM

## 2012-07-06 DIAGNOSIS — E7800 Pure hypercholesterolemia, unspecified: Secondary | ICD-10-CM

## 2012-07-06 DIAGNOSIS — F17200 Nicotine dependence, unspecified, uncomplicated: Secondary | ICD-10-CM

## 2012-07-06 DIAGNOSIS — I739 Peripheral vascular disease, unspecified: Secondary | ICD-10-CM

## 2012-07-06 DIAGNOSIS — G459 Transient cerebral ischemic attack, unspecified: Secondary | ICD-10-CM

## 2012-07-06 NOTE — Patient Instructions (Addendum)
Your physician wants you to follow-up in: 6 MONTHS with Dr McAlhany (previous pt of Dr Stuckey). You will receive a reminder letter in the mail two months in advance. If you don't receive a letter, please call our office to schedule the follow-up appointment.  Your physician recommends that you continue on your current medications as directed. Please refer to the Current Medication list given to you today.  

## 2012-07-06 NOTE — Progress Notes (Signed)
HPI:  This patient returns with his wife who is also being seen today.  Since I last saw him, he needed a tooth out and tied it to a car door with a string, and yanked it out that way.  He has two remaining teeth.  He does continue to smoke despite counseling, but has had a long history of difficulty quitting smoking.  He probably has had between 50-100 spells of episodes in the past where his left arm and leg got weak.  He would drag his leg, and the spells would resolve in about an hour.  He has refused to go to the hospital.  He also gets short of breath when this occurs.  What exactly happens is not entirely clear.  He has had a carotid doppler here which has not demonstrated high grade disease  (See report  40-59 on R, less on left);  CT in past was negative as well.  He has seen Dr. Clifton James for PVD.    Current Outpatient Prescriptions  Medication Sig Dispense Refill  . carbidopa-levodopa (SINEMET CR) 50-200 MG per tablet Take 1 tablet by mouth 3 (three) times daily.       [name] esomeprazole (NEXIUM) 40 MG capsule Take 40 mg by mouth daily before breakfast.      . ezetimibe (ZETIA) 10 MG tablet Take 10 mg by mouth every morning.       . gabapentin (NEURONTIN) 300 MG capsule Take 300 mg by mouth 2 (two) times daily.       [name] glipiZIDE (GLUCOTROL) 10 MG tablet Take 10 mg by mouth 2 (two) times daily before a meal.      . levothyroxine (SYNTHROID, LEVOTHROID) 25 MCG tablet Take 25 mcg by mouth every morning.       . metFORMIN (GLUCOPHAGE) 500 MG tablet       . nitroGLYCERIN (NITROSTAT) 0.4 MG SL tablet Place 0.4 mg under the tongue every 5 (five) minutes as needed. For chest pain      . NON FORMULARY Take 1 tablet by mouth 2 (two) times daily.      [name] oxyCODONE-acetaminophen (ROXICET) 5-325 MG per tablet Take 1-2 tablets by mouth every 6 (six) hours as needed for pain.  60 tablet  0  . rOPINIRole (REQUIP) 3 MG tablet Take 3 mg by mouth 4 (four) times daily.        . rosuvastatin (CRESTOR) 20 MG  tablet Take 20 mg by mouth every morning.       . tiotropium (SPIRIVA) 18 MCG inhalation capsule Place 18 mcg into inhaler and inhale daily.      . traMADol (ULTRAM) 50 MG tablet Take 50-100 mg by mouth 3 (three) times daily as needed. For pain      . pantoprazole (PROTONIX) 40 MG tablet TAKE ONE TABLET DAILY       No current facility-administered medications for this visit.    Allergies  Allergen Reactions  . Aspirin Other (See Comments)    Nose bleeds     Past Medical History  Diagnosis Date  . Diabetes mellitus   . Hypertension   . Coronary artery disease   . Hypercholesterolemia   . SOB (shortness of breath)   . Anxiety   . Parkinson's disease   . Arthritis   . PAD (peripheral artery disease)     No past surgical history on file.  Family History  Problem Relation Age of Onset  . Restless legs syndrome Brother  History   Social History  . Marital Status: Married    Spouse Name: N/A    Number of Children: N/A  . Years of Education: N/A   Occupational History  . Not on file.   Social History Main Topics  . Smoking status: Current Every Day Smoker -- 1.00 packs/day    Types: Cigarettes  . Smokeless tobacco: Not on file  . Alcohol Use:   . Drug Use:   . Sexually Active:    Other Topics Concern  . Not on file   Social History Narrative  . No narrative on file    ROS: Please see the HPI.  All other systems reviewed and negative.  PHYSICAL EXAM:  BP 124/70  Pulse 69  Ht 5\' 9"  (1.753 m)  Wt 189 lb (85.73 kg)  BMI 27.9 kg/m2  SpO2 96%  General: Well developed, well nourished, in no acute distress. Head:  Normocephalic and atraumatic. Mouth:  Two remaining teeth, difficult to see bed of removed tooth.  No odor.   Neck: no JVD Lungs: prolonged expiration.   Heart: Normal S1 and S2.  No murmur, rubs or gallops.  Abdomen:  Normal bowel sounds; soft; non tender; no organomegaly. Decrease pulses in the feet.   Extremities: No clubbing or  cyanosis. No edema. Neurologic: Alert and oriented x 3.  EKG:  NSR.  WNL.    PRIOR CATH 2010  ANGIOGRAPHIC DATA:  1. The left main has calcification, mild luminal irregularity, but  noncritical disease.  2. The LAD has a lesion of probably 70-80% that is calcified and  eccentric in the ostium. The distal left anterior descending  artery has diffuse luminal irregularity compatible with diabetes.  The diagonal also has similar findings. The circumflex artery has  diffuse 40% narrowing leading into a moderate size marginal branch,  but it does not appear to be flow limiting. Fractional flow  reserve was performed after 40 mcg of adenosine and also 50 mcg of  adenosine, in both cases it was measured at 0.94.  3. Intravascular ultrasound was performed after intracoronary  nitroglycerin administration. The reference vessel demonstrated a  lumen of approximately 3.5 x nearly 3.5 with moderate plaque. The  ostium leading into the left main does have an eccentric calcified  shelf, but at the most severe point where the left main comes in,  it is probably 3 mm x 2 mm. This corresponds and correlates to  some extent with the findings of FFR.  CONCLUSION: Moderately high-grade ostial stenosis of the left anterior  descending artery, which by intravascular ultrasound and fractional flow  reserve, does not appear to be flow limiting.  DISPOSITION: My biggest concern is the patient has diabetes and  continues to smoke. His risk of acute coronary syndrome remains  increased. We will try to do our best to try to change this behavior, I  have reviewed the case with Dr. Excell Seltzer Dr. Clifton James and leaning at the  present time towards medical therapy.  [name]. Riley Kill, MD, [facility]  Electronically Signed  TDS/MEDQ D: 10/15/2008 T: 10/16/2008 Job: [id number]  cc: Tammy R. Collins Scotland, M.D.   ASSESSMENT AND PLAN:

## 2012-07-08 NOTE — Assessment & Plan Note (Signed)
The patient is followed by Dr. Clifton James.  Continued medical therapy is warranted.

## 2012-07-08 NOTE — Assessment & Plan Note (Signed)
The patient continues to remain stable from a cardiac standpoint. He'll continue medical therapy at the present time. He understands the need to quit smoking, but it would be doubtful that he'll be able to accomplish this. He also dipped snuff at times. She'll change in status he is to contact us promptly. He does have significant risk for ACS

## 2012-07-08 NOTE — Assessment & Plan Note (Addendum)
Some of these episodes sound like they could be TIAs, although he has had 50 to 100 of these in the past. They have not been directly evaluated in the past. He has seen Dr. Collins Scotland before and after some of these.  However, none have been sustained.  He is on likely DAPT.  He should stop smoking.  I suppose that a neuro visit might be warranted but he will have to agree to that.

## 2012-07-08 NOTE — Assessment & Plan Note (Signed)
Patient remains enrolled in the research study trial.

## 2012-07-08 NOTE — Assessment & Plan Note (Signed)
He has continued to smoke as has his wife who had CAD.  They have had a difficult time with this.

## 2012-07-10 ENCOUNTER — Other Ambulatory Visit: Payer: Self-pay

## 2012-07-10 DIAGNOSIS — G459 Transient cerebral ischemic attack, unspecified: Secondary | ICD-10-CM

## 2012-07-10 NOTE — Progress Notes (Signed)
Per Dr Riley Kill: Please make a referral to neuro and have them contact him.  I spoke with the pt's wife and made her aware that Dr Riley Kill would like to have the pt evaluated by Neurology. Order placed in EPIC and a scheduler will contact the pt with appointment.

## 2012-08-07 ENCOUNTER — Institutional Professional Consult (permissible substitution): Payer: Self-pay | Admitting: Neurology

## 2012-08-21 ENCOUNTER — Ambulatory Visit: Payer: Medicare Other | Admitting: Cardiovascular Disease

## 2012-09-26 ENCOUNTER — Ambulatory Visit: Payer: Medicare Other | Admitting: Cardiovascular Disease

## 2013-02-06 ENCOUNTER — Encounter (INDEPENDENT_AMBULATORY_CARE_PROVIDER_SITE_OTHER): Payer: Medicare Other

## 2013-02-06 DIAGNOSIS — I6529 Occlusion and stenosis of unspecified carotid artery: Secondary | ICD-10-CM

## 2013-03-01 ENCOUNTER — Encounter: Payer: Self-pay | Admitting: Cardiovascular Disease

## 2013-04-23 ENCOUNTER — Ambulatory Visit (INDEPENDENT_AMBULATORY_CARE_PROVIDER_SITE_OTHER): Payer: Medicare Other | Admitting: Cardiovascular Disease

## 2013-04-23 ENCOUNTER — Encounter: Payer: Self-pay | Admitting: Cardiovascular Disease

## 2013-04-23 VITALS — BP 140/70 | HR 81 | Ht 69.0 in | Wt 173.0 lb

## 2013-04-23 DIAGNOSIS — F17200 Nicotine dependence, unspecified, uncomplicated: Secondary | ICD-10-CM

## 2013-04-23 DIAGNOSIS — R0600 Dyspnea, unspecified: Secondary | ICD-10-CM

## 2013-04-23 DIAGNOSIS — R0989 Other specified symptoms and signs involving the circulatory and respiratory systems: Secondary | ICD-10-CM

## 2013-04-23 DIAGNOSIS — R0609 Other forms of dyspnea: Secondary | ICD-10-CM

## 2013-04-23 DIAGNOSIS — I2510 Atherosclerotic heart disease of native coronary artery without angina pectoris: Secondary | ICD-10-CM

## 2013-04-23 DIAGNOSIS — Z720 Tobacco use: Secondary | ICD-10-CM

## 2013-04-23 DIAGNOSIS — I739 Peripheral vascular disease, unspecified: Secondary | ICD-10-CM

## 2013-04-23 NOTE — Patient Instructions (Signed)
Your physician recommends that you schedule a follow-up appointment in:   3 months.   Your physician has recommended that you have a pulmonary function test. Pulmonary Function Tests are a group of tests that measure how well air moves in and out of your lungs.  Your physician has requested that you have an ankle brachial index (ABI). During this test an ultrasound and blood pressure cuff are used to evaluate the arteries that supply the arms and legs with blood. Allow thirty minutes for this exam. There are no restrictions or special instructions.

## 2013-04-23 NOTE — Progress Notes (Signed)
History of Present Illness: 67 yo WM with history of DM, HTN, CAD, HLD, Parkinson's Disease, ongoing tobacco abuse and PAD who is here today for cardiac and PV follow up. I saw him in February 2013 as a new PV patient. His cardiac issues have been followed by Dr. Riley Kill. His PV issues had been followed in the [facility] by Dr. Allyson Sabal but he changed to our [facility] clinic in February 2013.  In April of 2012, he had ABI of 0.73 on the right and 0.84 on the left. Dr. Allyson Sabal arranged a distal aortogram with bilateral LE runoff. The patient described life-style limiting claudication at that time. He was found to have a CTO of the left SFA with reconstituion by collaterals. He also had a severe 99% proximal right SFA stenosis treated with a self expanding stent. (7 x 40 Abbott Absolute Pro nitinol self-expanding stent. Postdilatation was performed with a 6 x 4 balloon at 2 atmospheres). Dr. Riley Kill saw him in f/u in November 2012 and arranged repeat non-invasive imaging. Lower ext arterial dopplers on 05/30/11 with ABI of 0.63 on the right and 0.64 on the left. There were elevated velocities in the R SFA suggesting progression of disease in the area of the stent.  I arranged a distal aortogram with bilateral lower extremity runoff on 07/13/11. His left SFA CTO was unchanged with good collaterals. His proximal right SFA stent had a 99% stenosis in the distal portion of the stented segment. I performed a balloon angioplasty and had an unfavorable result so a 6.0 x 40 mm SMART Cordis self expanding stent was placed in an overlapping fashion with the previously placed stent. I then post-dilated with a 5.0 x 40 mm balloon and then hit the overlap with this balloon. There was an excellent result. The stenosis was taken from 95% within the stent to 0% and the stenosis beyond the stent was taken from 80% down to 0%. Non-invasive imaging March 2013 with ABI of 0.84 on the right and 0.62 on the left which  was improved from the pre-procedure study. Most recent ABI on 02/21/12 with ABI of 0.67 on the right and 0.78 on the left. The right ATA and PTA waveforms are biphasic. Left ATA and PTA waveforms are biphasic. Last carotids 02/06/13 with 40-59% RICA stenosis, 1-39% LICA stenosis. Cardiac history includes cath in April 2010 with 70% ostial LAD (FFR 0.94), 60% OM, 40% mid RCA. Dr. Riley Kill has managed medically since then. LVEF normal during that cath.   He is here today for cardiac and PV follow up. He tells me that his legs feel better. His feet tingle when he gets hot. No rest pain or ulcerations. He is still smoking and now dipping tobacco to try to slow down with the cigarettes. No chest pain. He does have dyspnea when lying down at night. No exertional chest pain or dyspnea.   Primary Care Physician: Herb Grays    Past Medical History  Diagnosis Date  . Diabetes mellitus   . Hypertension   . Coronary artery disease   . Hypercholesterolemia   . SOB (shortness of breath)   . Anxiety   . Parkinson's disease   . Arthritis   . PAD (peripheral artery disease)     No past surgical history on file.  Current Outpatient Prescriptions  Medication Sig Dispense Refill  . carbidopa-levodopa (SINEMET CR) 50-200 MG per tablet Take 1 tablet by mouth 3 (three) times daily.       [name]  esomeprazole (NEXIUM) 40 MG capsule Take 40 mg by mouth daily before breakfast.      . ezetimibe (ZETIA) 10 MG tablet Take 10 mg by mouth every morning.       . gabapentin (NEURONTIN) 300 MG capsule Take 300 mg by mouth 2 (two) times daily.       [name] glipiZIDE (GLUCOTROL) 10 MG tablet Take 10 mg by mouth 2 (two) times daily before a meal.      . levothyroxine (SYNTHROID, LEVOTHROID) 25 MCG tablet Take 25 mcg by mouth every morning.       . metFORMIN (GLUCOPHAGE) 500 MG tablet       . nitroGLYCERIN (NITROSTAT) 0.4 MG SL tablet Place 0.4 mg under the tongue every 5 (five) minutes as needed. For chest pain      . NON FORMULARY  Take 1 tablet by mouth 2 (two) times daily.      [name] oxyCODONE-acetaminophen (ROXICET) 5-325 MG per tablet Take 1-2 tablets by mouth every 6 (six) hours as needed for pain.  60 tablet  0  . pantoprazole (PROTONIX) 40 MG tablet TAKE ONE TABLET DAILY      . rOPINIRole (REQUIP) 3 MG tablet Take 3 mg by mouth 4 (four) times daily.        . rosuvastatin (CRESTOR) 20 MG tablet Take 20 mg by mouth every morning.       . tiotropium (SPIRIVA) 18 MCG inhalation capsule Place 18 mcg into inhaler and inhale daily.      . traMADol (ULTRAM) 50 MG tablet Take 50-100 mg by mouth 3 (three) times daily as needed. For pain       No current facility-administered medications for this visit.    Allergies  Allergen Reactions  . Aspirin Other (See Comments)    Nose bleeds     History   Social History  . Marital Status: Married    Spouse Name: N/A    Number of Children: N/A  . Years of Education: N/A   Occupational History  . Not on file.   Social History Main Topics  . Smoking status: Current Every Day Smoker -- 1.00 packs/day    Types: Cigarettes  . Smokeless tobacco: Not on file  . Alcohol Use:   . Drug Use:   . Sexual Activity:    Other Topics Concern  . Not on file   Social History Narrative  . No narrative on file    Family History  Problem Relation Age of Onset  . Restless legs syndrome Brother     Review of Systems:  As stated in the HPI and otherwise negative.   BP 140/70  Pulse 81  Ht 5\' 9"  (1.753 m)  Wt 173 lb (78.472 kg)  BMI 25.54 kg/m2  Physical Examination: General: Well developed, well nourished, NAD HEENT: OP clear, mucus membranes moist SKIN: warm, dry. No rashes. Neuro: No focal deficits Musculoskeletal: Muscle strength 5/5 all ext Psychiatric: Mood and affect normal Neck: No JVD, no carotid bruits, no thyromegaly, no lymphadenopathy. Lungs:Clear bilaterally, poor air movement. No wheezes, rhonci, crackles Cardiovascular: Regular rate and rhythm. No murmurs,  gallops or rubs. Abdomen:Soft. Bowel sounds present. Non-tender.  Extremities: No lower extremity edema. Pulses are trace in the bilateral DP/PT.  Lower extremity arterial dopplers: 02/21/12: Right ABI 0.67, Left ABI 0.78.   EKG: NSR, rate 81 bpm.  Carotid artery dopplers: 40-59% RICA stenosis, 1-39% LICA stenosis. Patent vertebral arteries    Cardiac cath 09/04/08:  On plain fluoroscopy, there was  fairly extensive calcification of  the coronary arteries.  2. The left main is free of critical disease. It trifurcates into a  small intermediate and larger circumflex and LAD.  3. The LAD courses to the apex. There are 2 moderate diagonal  branches and several septal perforators. The ostium of the LAD has  an eccentric wedge-like defect in the proximal vessel resulting in  reduction in lumen diameter, probably 60-70%. It potentially could  be tighter than this or less tight, and multiple views were done to  try to lay this out best. The remainder LAD coursed to the apex  and had luminal irregularities but no critical stenoses.  4. The circumflex provides a moderate-sized first marginal, and then a  smaller second and third marginals. Leading into the 2 smaller  marginals and into the larger marginal, there is a diffuse plaque  of about 40-50%. It does not appear to be high grade or flow  limiting. The distal vessel is of moderate size, and a marginal is  not critically diseased. The other 2 vessels are relatively small  in caliber.  5. The right coronary artery is a moderate-sized vessel with moderate  calcification. There is an eccentric plaque in the mid vessel of  about 40%, posterolateral branch has 20-30% narrowing. There is  mild luminal irregularity elsewhere.  6. Ventriculography in the RAO projection reveals ejection fraction in  excess of 55%. There is 1+ mitral regurgitation.  Assessment and Plan:   1. PAD:  He is enrolled in EUCLID and randomized to either Plavix or  Brilinta. His leg pain is stable. He is able to work in his garden and yard. No rest pain or ulcerations. Known to have a CTO of the left SFA which is stable with stable by ABI 2013. Right ABI is slightly reduced but no change in symptoms. He continues to smoke which he understands is jeopardizing the circulation in his legs. I do not think repeat invasive imaging is necessary at this time unless his clinical picture changes. Repeat ABI now.   2. Tobacco Abuse:  Smoking cessation advised.   3. CAD:  Stable. He is known to have moderate LAD disease by cath 2011. No chest pain. He is very active. Continue medical therapy. If he has change in symptoms, will call.   4. Dyspnea: Mostly at night when lying down. NO exertional dyspnea. I do not think this is cardiac related. Long term tobacco abuse, ongoing. Will arrange PFTs and then refer back to primary care. He needs to stop smoking.       30 minutes reviewing old records, examination, reviewing plan and smoking cessation counseling.

## 2013-04-25 ENCOUNTER — Other Ambulatory Visit: Payer: Self-pay | Admitting: Cardiovascular Disease

## 2013-04-25 ENCOUNTER — Other Ambulatory Visit: Payer: Self-pay | Admitting: *Deleted

## 2013-04-25 DIAGNOSIS — I739 Peripheral vascular disease, unspecified: Secondary | ICD-10-CM

## 2013-04-30 ENCOUNTER — Ambulatory Visit (HOSPITAL_COMMUNITY): Payer: Medicare Other | Attending: Cardiovascular Disease

## 2013-04-30 DIAGNOSIS — I739 Peripheral vascular disease, unspecified: Secondary | ICD-10-CM | POA: Insufficient documentation

## 2013-05-02 ENCOUNTER — Other Ambulatory Visit (HOSPITAL_COMMUNITY): Payer: Medicare Other

## 2013-05-02 ENCOUNTER — Encounter (HOSPITAL_COMMUNITY): Payer: Medicare Other

## 2013-05-03 ENCOUNTER — Encounter: Payer: Self-pay | Admitting: *Deleted

## 2013-05-08 ENCOUNTER — Ambulatory Visit: Payer: Medicare Other | Admitting: Cardiovascular Disease

## 2013-05-17 ENCOUNTER — Telehealth: Payer: Self-pay | Admitting: Cardiovascular Disease

## 2013-05-17 NOTE — Telephone Encounter (Signed)
I spoke with the pt's wife and made her aware of ABI results. I also scheduled the pt for 3 month follow-up appointment with Dr Angelena Form.

## 2013-05-17 NOTE — Telephone Encounter (Signed)
New Problem:  Pt's caregiver is calling to hear recent test results. States she got a letter to call for results.

## 2013-05-22 ENCOUNTER — Ambulatory Visit (HOSPITAL_COMMUNITY): Payer: Medicare Other

## 2013-07-23 ENCOUNTER — Encounter: Payer: Self-pay | Admitting: Cardiovascular Disease

## 2013-07-23 ENCOUNTER — Ambulatory Visit (INDEPENDENT_AMBULATORY_CARE_PROVIDER_SITE_OTHER): Payer: Managed Care, Other (non HMO) | Admitting: Cardiovascular Disease

## 2013-07-23 ENCOUNTER — Encounter: Payer: Self-pay | Admitting: *Deleted

## 2013-07-23 VITALS — BP 142/64 | HR 67 | Ht 69.0 in | Wt 173.0 lb

## 2013-07-23 DIAGNOSIS — R0609 Other forms of dyspnea: Secondary | ICD-10-CM

## 2013-07-23 DIAGNOSIS — E7800 Pure hypercholesterolemia, unspecified: Secondary | ICD-10-CM

## 2013-07-23 DIAGNOSIS — I739 Peripheral vascular disease, unspecified: Secondary | ICD-10-CM

## 2013-07-23 DIAGNOSIS — I200 Unstable angina: Secondary | ICD-10-CM

## 2013-07-23 DIAGNOSIS — R0989 Other specified symptoms and signs involving the circulatory and respiratory systems: Secondary | ICD-10-CM

## 2013-07-23 DIAGNOSIS — F17200 Nicotine dependence, unspecified, uncomplicated: Secondary | ICD-10-CM

## 2013-07-23 DIAGNOSIS — I2510 Atherosclerotic heart disease of native coronary artery without angina pectoris: Secondary | ICD-10-CM

## 2013-07-23 DIAGNOSIS — R0600 Dyspnea, unspecified: Secondary | ICD-10-CM

## 2013-07-23 LAB — BASIC METABOLIC PANEL
BUN: 13 mg/dL (ref 6–23)
CO2: 26 mEq/L (ref 19–32)
Calcium: 9.6 mg/dL (ref 8.4–10.5)
Chloride: 99 mEq/L (ref 96–112)
Creatinine, Ser: 1.1 mg/dL (ref 0.4–1.5)
GFR: 71.25 mL/min (ref >60.00)
GLUCOSE: 450 mg/dL — AB (ref 70–99)
POTASSIUM: 4.4 meq/L (ref 3.5–5.1)
SODIUM: 133 meq/L — AB (ref 135–145)

## 2013-07-23 LAB — CBC WITH DIFFERENTIAL/PLATELET
Basophils Absolute: 0.1 10*3/uL (ref 0.0–0.1)
Basophils Relative: 1 % (ref 0.0–3.0)
EOS PCT: 1 % (ref 0.0–5.0)
Eosinophils Absolute: 0.1 10*3/uL (ref 0.0–0.7)
HCT: 49.5 % (ref 39.0–52.0)
Hemoglobin: 16.7 g/dL (ref 13.0–17.0)
LYMPHS PCT: 25.1 % (ref 12.0–46.0)
Lymphs Abs: 2 10*3/uL (ref 0.7–4.0)
MCHC: 33.7 g/dL (ref 30.0–36.0)
MCV: 89.8 fl (ref 78.0–100.0)
MONO ABS: 0.4 10*3/uL (ref 0.1–1.0)
MONOS PCT: 5.4 % (ref 3.0–12.0)
NEUTROS PCT: 67.5 % (ref 43.0–77.0)
Neutro Abs: 5.4 10*3/uL (ref 1.4–7.7)
PLATELETS: 187 10*3/uL (ref 150.0–400.0)
RBC: 5.51 Mil/uL (ref 4.22–5.81)
RDW: 13.5 % (ref 11.5–14.6)
WBC: 7.9 10*3/uL (ref 4.5–10.5)

## 2013-07-23 LAB — PROTIME-INR
INR: 1 ratio (ref 0.8–1.0)
PROTHROMBIN TIME: 10.7 s (ref 10.2–12.4)

## 2013-07-23 NOTE — Progress Notes (Signed)
History of Present Illness: 68 yo WM with history of DM, HTN, CAD, HLD, Parkinson's Disease, ongoing tobacco abuse and PAD who is here today for cardiac and PV follow up. I saw him in February 2013 as a new PV patient. His cardiac issues have been followed by Dr. Lia Foyer. His PV issues had been followed in the [facility] by Dr. Gwenlyn Found but he changed to our [facility] clinic in February 2013.  In April of 2012, he had ABI of 0.73 on the right and 0.84 on the left. Dr. Gwenlyn Found arranged a distal aortogram with bilateral LE runoff. The patient described life-style limiting claudication at that time. He was found to have a CTO of the left SFA with reconstituion by collaterals. He also had a severe 99% proximal right SFA stenosis treated with a self expanding stent. (7 x 40 Abbott Absolute Pro nitinol self-expanding stent. Postdilatation was performed with a 6 x 4 balloon at 2 atmospheres). Dr. Lia Foyer saw him in f/u in November 2012 and arranged repeat non-invasive imaging. Lower ext arterial dopplers on 05/30/11 with ABI of 0.63 on the right and 0.64 on the left. There were elevated velocities in the R SFA suggesting progression of disease in the area of the stent.  I arranged a distal aortogram with bilateral lower extremity runoff on 07/13/11. His left SFA CTO was unchanged with good collaterals. His proximal right SFA stent had a 99% stenosis in the distal portion of the stented segment. I performed a balloon angioplasty and had an unfavorable result so a 6.0 x 40 mm SMART Cordis self expanding stent was placed in an overlapping fashion with the previously placed stent. I then post-dilated with a 5.0 x 40 mm balloon and then hit the overlap with this balloon. There was an excellent result. The stenosis was taken from 95% within the stent to 0% and the stenosis beyond the stent was taken from 80% down to 0%. Last ABI 04/30/13 right 0.68, left 0.80.  Last carotids 02/06/13 with 40-59% RICA stenosis,  9-62% LICA stenosis. Cardiac history includes cath in April 2010 with 70% ostial LAD (FFR 0.94), 60% OM, 40% mid RCA. Dr. Lia Foyer has managed medically since then. LVEF normal during that cath.   He is here today for cardiac and PV follow up. He tells me that his legs feel better. His feet tingle when he gets hot. No rest pain or ulcerations. He is still smoking and now dipping tobacco to try to slow down with the cigarettes. He has had recent chest pains and dyspnea at rest and with exertion.   Primary Care Physician: Florina Ou    Past Medical History  Diagnosis Date  . Diabetes mellitus   . Hypertension   . Coronary artery disease   . Hypercholesterolemia   . SOB (shortness of breath)   . Anxiety   . Parkinson's disease   . Arthritis   . PAD (peripheral artery disease)     No past surgical history on file.  Current Outpatient Prescriptions  Medication Sig Dispense Refill  . carbidopa-levodopa (SINEMET CR) 50-200 MG per tablet Take 1 tablet by mouth 3 (three) times daily.       [name] esomeprazole (NEXIUM) 40 MG capsule Take 40 mg by mouth daily before breakfast.      . ezetimibe (ZETIA) 10 MG tablet Take 10 mg by mouth every morning.       . gabapentin (NEURONTIN) 300 MG capsule Take 300 mg by mouth 2 (  two) times daily.       [name] glipiZIDE (GLUCOTROL) 10 MG tablet Take 10 mg by mouth 2 (two) times daily before a meal.      . levothyroxine (SYNTHROID, LEVOTHROID) 25 MCG tablet Take 25 mcg by mouth every morning.       . metFORMIN (GLUCOPHAGE) 500 MG tablet       . nitroGLYCERIN (NITROSTAT) 0.4 MG SL tablet Place 0.4 mg under the tongue every 5 (five) minutes as needed. For chest pain      . NON FORMULARY Take 1 tablet by mouth 2 (two) times daily.      [name] oxyCODONE-acetaminophen (ROXICET) 5-325 MG per tablet Take 1-2 tablets by mouth every 6 (six) hours as needed for pain.  60 tablet  0  . pantoprazole (PROTONIX) 40 MG tablet TAKE ONE TABLET DAILY      . rOPINIRole (REQUIP) 3 MG tablet  Take 3 mg by mouth 4 (four) times daily.        . rosuvastatin (CRESTOR) 20 MG tablet Take 20 mg by mouth every morning.       . tiotropium (SPIRIVA) 18 MCG inhalation capsule Place 18 mcg into inhaler and inhale daily.      . traMADol (ULTRAM) 50 MG tablet Take 50-100 mg by mouth 3 (three) times daily as needed. For pain       No current facility-administered medications for this visit.    Allergies  Allergen Reactions  . Aspirin Other (See Comments)    Nose bleeds     History   Social History  . Marital Status: Married    Spouse Name: N/A    Number of Children: N/A  . Years of Education: N/A   Occupational History  . Not on file.   Social History Main Topics  . Smoking status: Current Every Day Smoker -- 1.00 packs/day    Types: Cigarettes  . Smokeless tobacco: Not on file  . Alcohol Use:   . Drug Use:   . Sexual Activity:    Other Topics Concern  . Not on file   Social History Narrative  . No narrative on file    Family History  Problem Relation Age of Onset  . Restless legs syndrome Brother     Review of Systems:  As stated in the HPI and otherwise negative.   BP 142/64  Pulse 67  Ht 5\' 9"  (1.753 m)  Wt 173 lb (78.472 kg)  BMI 25.54 kg/m2  Physical Examination: General: Well developed, well nourished, NAD HEENT: OP clear, mucus membranes moist SKIN: warm, dry. No rashes. Neuro: No focal deficits Musculoskeletal: Muscle strength 5/5 all ext Psychiatric: Mood and affect normal Neck: No JVD, no carotid bruits, no thyromegaly, no lymphadenopathy. Lungs:Clear bilaterally, poor air movement. No wheezes, rhonci, crackles Cardiovascular: Regular rate and rhythm. No murmurs, gallops or rubs. Abdomen:Soft. Bowel sounds present. Non-tender.  Extremities: No lower extremity edema. Pulses are trace in the bilateral DP/PT.  Lower extremity arterial dopplers: December 2014: Right ABI 0.68, Left ABI 0.80.   Carotid artery dopplers:02/06/13:  40-59% RICA  stenosis, 9-98% LICA stenosis. Patent vertebral arteries    Cardiac cath 09/04/08:  On plain fluoroscopy, there was fairly extensive calcification of  the coronary arteries.  2. The left main is free of critical disease. It trifurcates into a  small intermediate and larger circumflex and LAD.  3. The LAD courses to the apex. There are 2 moderate diagonal  branches and several septal perforators. The ostium of the  LAD has  an eccentric wedge-like defect in the proximal vessel resulting in  reduction in lumen diameter, probably 60-70%. It potentially could  be tighter than this or less tight, and multiple views were done to  try to lay this out best. The remainder LAD coursed to the apex  and had luminal irregularities but no critical stenoses.  4. The circumflex provides a moderate-sized first marginal, and then a  smaller second and third marginals. Leading into the 2 smaller  marginals and into the larger marginal, there is a diffuse plaque  of about 40-50%. It does not appear to be high grade or flow  limiting. The distal vessel is of moderate size, and a marginal is  not critically diseased. The other 2 vessels are relatively small  in caliber.  5. The right coronary artery is a moderate-sized vessel with moderate  calcification. There is an eccentric plaque in the mid vessel of  about 40%, posterolateral branch has 20-30% narrowing. There is  mild luminal irregularity elsewhere.  6. Ventriculography in the RAO projection reveals ejection fraction in  excess of 55%. There is 1+ mitral regurgitation.  Assessment and Plan:   1. PAD:  He is enrolled in EUCLID and randomized to either Plavix or Brilinta. His leg pain is stable. He is able to work in his garden and yard. No rest pain or ulcerations. Known to have a CTO of the left SFA which is stable with stable ABI December 2014.   2. Tobacco Abuse:  Smoking cessation advised.   3. CAD:  Recent chest pains and SOB concerning for  unstable angina. Last cath 2010 with 70% ostial LAD stenosis. Will plan cath to exclude progression of CAD next week at [facility]. Risks and benefits reviewed. Pre-cath labs today  4. Dyspnea: Mostly at night when lying down. NO exertional dyspnea. Long term tobacco abuse, ongoing. Will arrange PFTs and then refer back to primary care. He needs to stop smoking.      5. Unstable angina: See above. Cath next week.

## 2013-07-23 NOTE — Patient Instructions (Signed)
Your physician recommends that you schedule a follow-up appointment in: 4-5 weeks.   Your physician has requested that you have a cardiac catheterization. Cardiac catheterization is used to diagnose and/or treat various heart conditions. Doctors may recommend this procedure for a number of different reasons. The most common reason is to evaluate chest pain. Chest pain can be a symptom of coronary artery disease (CAD), and cardiac catheterization can show whether plaque is narrowing or blocking your heart's arteries. This procedure is also used to evaluate the valves, as well as measure the blood flow and oxygen levels in different parts of your heart. For further information please visit [email or web address]. Please follow instruction sheet, as given. Scheduled for August 01, 2013

## 2013-07-29 ENCOUNTER — Encounter (HOSPITAL_COMMUNITY): Payer: Self-pay | Admitting: Pharmacy Technician

## 2013-08-01 ENCOUNTER — Encounter (HOSPITAL_COMMUNITY)
Admission: RE | Disposition: A | Discharge status: Home / Self Care | Payer: Self-pay | Source: Ambulatory Visit | Attending: Cardiovascular Disease

## 2013-08-01 ENCOUNTER — Ambulatory Visit (HOSPITAL_COMMUNITY)
Admission: RE | Admit: 2013-08-01 | Discharge: 2013-08-01 | Disposition: A | Discharge status: Home / Self Care | Payer: Medicare HMO | Source: Ambulatory Visit | Attending: Cardiovascular Disease | Admitting: Cardiovascular Disease

## 2013-08-01 DIAGNOSIS — I2510 Atherosclerotic heart disease of native coronary artery without angina pectoris: Secondary | ICD-10-CM

## 2013-08-01 SURGERY — LEFT HEART CATHETERIZATION WITH CORONARY ANGIOGRAM
Anesthesia: LOCAL

## 2013-08-01 MED ORDER — ASPIRIN 81 MG PO CHEW: 81.0000 mg | CHEWABLE_TABLET | ORAL | Status: DC

## 2013-08-01 MED ORDER — SODIUM CHLORIDE 0.9 % IV SOLN: INTRAVENOUS | Status: DC

## 2013-08-01 MED ORDER — SODIUM CHLORIDE 0.9 % IV SOLN: 250.0000 mL | INTRAVENOUS | Status: DC | PRN

## 2013-08-01 MED ORDER — SODIUM CHLORIDE 0.9 % IJ SOLN: 3.0000 mL | Freq: Two times a day (BID) | INTRAMUSCULAR | Status: DC

## 2013-08-01 MED ORDER — SODIUM CHLORIDE 0.9 % IJ SOLN
3.0000 mL | INTRAMUSCULAR | Status: DC | PRN
Start: 2013-08-01 — End: 2013-08-01

## 2013-08-01 MED ORDER — DIAZEPAM 5 MG PO TABS: 5.0000 mg | ORAL_TABLET | ORAL | Status: DC

## 2013-08-01 NOTE — Progress Notes (Signed)
When pt instructed to remove clothing for procedure prep, he stated that he was not removing his pants and that the MD was going to use his wrist.  We confirmed that the wrist was the preferred access but that his pants needed to be removed in case the wrist was not an option.  We explained that for safety and efficiency that the pants needed to be removed.  The pt was adamant and said that he was leaving.  He was not taking his pants off and that he was not having the procedure.  He said that if he left and died that it was on him.  He left before we could call Rennis Harding or the physician.

## 2013-08-23 ENCOUNTER — Telehealth: Payer: Self-pay | Admitting: Cardiovascular Disease

## 2013-08-23 NOTE — Telephone Encounter (Signed)
I placed call to pt and there was no answer.  Received message that no voicemail has been set up.  No other number listed for pt.

## 2013-08-23 NOTE — Telephone Encounter (Signed)
Spoke with pt's girlfriend and pt will keep scheduled appt with Dr. Angelena Form on September 10, 2013

## 2013-08-23 NOTE — Telephone Encounter (Signed)
New problem    Pt's girl friend called to say pt did not have his CATH done 3/19.   Pt's girl friend called to cancel 4/28 appointment.  Pt needs a call back on what to do.

## 2013-09-10 ENCOUNTER — Ambulatory Visit: Payer: Managed Care, Other (non HMO) | Admitting: Cardiovascular Disease

## 2013-12-16 ENCOUNTER — Encounter: Payer: Self-pay | Admitting: Cardiovascular Disease

## 2013-12-16 ENCOUNTER — Ambulatory Visit (INDEPENDENT_AMBULATORY_CARE_PROVIDER_SITE_OTHER): Payer: Medicare HMO | Admitting: Cardiovascular Disease

## 2013-12-16 VITALS — BP 150/70 | HR 87 | Ht 69.0 in | Wt 167.0 lb

## 2013-12-16 DIAGNOSIS — E7800 Pure hypercholesterolemia, unspecified: Secondary | ICD-10-CM

## 2013-12-16 DIAGNOSIS — F17200 Nicotine dependence, unspecified, uncomplicated: Secondary | ICD-10-CM

## 2013-12-16 DIAGNOSIS — I2510 Atherosclerotic heart disease of native coronary artery without angina pectoris: Secondary | ICD-10-CM

## 2013-12-16 DIAGNOSIS — I739 Peripheral vascular disease, unspecified: Secondary | ICD-10-CM

## 2013-12-16 NOTE — Progress Notes (Signed)
History of Present Illness: 68 yo WM with history of DM, HTN, CAD, HLD, Parkinson's Disease, ongoing tobacco abuse and PAD who is here today for cardiac and PV follow up. I saw him in February 2013 as a new PV patient. His cardiac issues have been followed by Dr. Lia Foyer. His PV issues had been followed in the [facility] by Dr. Gwenlyn Found but he changed to our [facility] clinic in February 2013.  In April of 2012, he had ABI of 0.73 on the right and 0.84 on the left. Dr. Gwenlyn Found arranged a distal aortogram with bilateral LE runoff. The patient described life-style limiting claudication at that time. He was found to have a CTO of the left SFA with reconstituion by collaterals. He also had a severe 99% proximal right SFA stenosis treated with a self expanding stent. (7 x 40 Abbott Absolute Pro nitinol self-expanding stent. Postdilatation was performed with a 6 x 4 balloon at 2 atmospheres). Dr. Lia Foyer saw him in f/u in November 2012 and arranged repeat non-invasive imaging. Lower ext arterial dopplers on 05/30/11 with ABI of 0.63 on the right and 0.64 on the left. There were elevated velocities in the R SFA suggesting progression of disease in the area of the stent.  I arranged a distal aortogram with bilateral lower extremity runoff on 07/13/11. His left SFA CTO was unchanged with good collaterals. His proximal right SFA stent had a 99% stenosis in the distal portion of the stented segment. I performed a balloon angioplasty and had an unfavorable result so a 6.0 x 40 mm SMART Cordis self expanding stent was placed in an overlapping fashion with the previously placed stent. I then post-dilated with a 5.0 x 40 mm balloon and then hit the overlap with this balloon. There was an excellent result. The stenosis was taken from 95% within the stent to 0% and the stenosis beyond the stent was taken from 80% down to 0%. Last ABI 04/30/13 right 0.68, left 0.80.  Last carotids 02/06/13 with 40-59% RICA stenosis,  5-62% LICA stenosis. Cardiac history includes cath in April 2010 with 70% ostial LAD (FFR 0.94), 60% OM, 40% mid RCA. Dr. Lia Foyer has managed medically since then. LVEF normal during that cath. I saw him in March 2015 and he c/o chest pains and dyspnea at rest and with exertion. I arranged a cardiac cath at [facility]. He arrived for the cath but became angry when he was asked by nursing staff to remove his pants and he then left before he could have his cath.   He is here today for follow up. He has stopped taking all medications except for his EUCLID study drug. He states that he feels better off of all meds. He has no desire to restart his medications but says he will talk with Dr. Modena Morrow about this. He has not been taking his Synthroid or diabetic medications. His wife states that she cannot make him take his meds. He denies chest pain or SOB. He continues to smoke. He has no desire to stop. No leg pain.   Primary Care Physician: Florina Ou    Past Medical History  Diagnosis Date  . Diabetes mellitus   . Hypertension   . Coronary artery disease   . Hypercholesterolemia   . SOB (shortness of breath)   . Anxiety   . Parkinson's disease   . Arthritis   . PAD (peripheral artery disease)     No past surgical history on file.  Current Outpatient Prescriptions  Medication Sig Dispense Refill  . ezetimibe (ZETIA) 10 MG tablet Take 10 mg by mouth every morning.        No current facility-administered medications for this visit.    Allergies  Allergen Reactions  . Aspirin Other (See Comments)    Nose bleeds     History   Social History  . Marital Status: Married    Spouse Name: N/A    Number of Children: N/A  . Years of Education: N/A   Occupational History  . Not on file.   Social History Main Topics  . Smoking status: Current Every Day Smoker -- 1.00 packs/day    Types: Cigarettes  . Smokeless tobacco: Not on file  . Alcohol Use:   . Drug Use:   . Sexual Activity:    Other  Topics Concern  . Not on file   Social History Narrative  . No narrative on file    Family History  Problem Relation Age of Onset  . Restless legs syndrome Brother     Review of Systems:  As stated in the HPI and otherwise negative.   BP 150/70  Pulse 87  Ht 5\' 9"  (1.753 m)  Wt 167 lb (75.751 kg)  BMI 24.65 kg/m2  Physical Examination: General: Well developed, well nourished, NAD HEENT: OP clear, mucus membranes moist SKIN: warm, dry. No rashes. Neuro: No focal deficits Musculoskeletal: Muscle strength 5/5 all ext Psychiatric: Mood and affect normal Neck: No JVD, no carotid bruits, no thyromegaly, no lymphadenopathy. Lungs:Clear bilaterally, poor air movement. No wheezes, rhonci, crackles Cardiovascular: Regular rate and rhythm. No murmurs, gallops or rubs. Abdomen:Soft. Bowel sounds present. Non-tender.  Extremities: No lower extremity edema. Pulses are trace in the bilateral DP/PT.  Lower extremity arterial dopplers: December 2014: Right ABI 0.68, Left ABI 0.80.   Carotid artery dopplers:02/06/13:  40-59% RICA stenosis, 7-82% LICA stenosis. Patent vertebral arteries    Cardiac cath 09/04/08:  On plain fluoroscopy, there was fairly extensive calcification of  the coronary arteries.  2. The left main is free of critical disease. It trifurcates into a  small intermediate and larger circumflex and LAD.  3. The LAD courses to the apex. There are 2 moderate diagonal  branches and several septal perforators. The ostium of the LAD has  an eccentric wedge-like defect in the proximal vessel resulting in  reduction in lumen diameter, probably 60-70%. It potentially could  be tighter than this or less tight, and multiple views were done to  try to lay this out best. The remainder LAD coursed to the apex  and had luminal irregularities but no critical stenoses.  4. The circumflex provides a moderate-sized first marginal, and then a  smaller second and third marginals. Leading  into the 2 smaller  marginals and into the larger marginal, there is a diffuse plaque  of about 40-50%. It does not appear to be high grade or flow  limiting. The distal vessel is of moderate size, and a marginal is  not critically diseased. The other 2 vessels are relatively small  in caliber.  5. The right coronary artery is a moderate-sized vessel with moderate  calcification. There is an eccentric plaque in the mid vessel of  about 40%, posterolateral branch has 20-30% narrowing. There is  mild luminal irregularity elsewhere.  6. Ventriculography in the RAO projection reveals ejection fraction in  excess of 55%. There is 1+ mitral regurgitation.  Assessment and Plan:   1. PAD:  He is  enrolled in EUCLID and randomized to either Plavix or Brilinta. His leg pain is stable. No rest pain or ulcerations. Known to have a CTO of the left SFA which is stable with stable ABI December 2014.   2. Tobacco Abuse:  Smoking cessation advised. He does not wish to stop.   3. CAD:  Appears to be stable. He has no chest pain. He has stopped his statin. He does not wish to restart at this time. EKG is normal. He will continue the EUCLID study drug (either Plavix or Brilinta). As above, cardiac cath had been planned after last visit but he left the hospital in a rage before his cath. I do not think a cath is indicated at this time. He will call if he has a change in his clinical status.   4. Medication Non-compliance: I have spoken to him today about resuming all of his medications including Synthroid and his diabetes medications as well as statin. He says he feels better off of all meds and will speak to Dr. Modena Morrow about this at next visit. He understands the risk of being off of his medications. He is unwilling to speak about this today.

## 2013-12-16 NOTE — Patient Instructions (Addendum)
Your physician wants you to follow-up in:  6 months. You will receive a reminder letter in the mail two months in advance. If you don't receive a letter, please call our office to schedule the follow-up appointment.   

## 2013-12-16 NOTE — Addendum Note (Signed)
Addended by: Thompson Grayer on: 12/16/2013 01:59 PM   Modules accepted: Orders

## 2014-04-24 ENCOUNTER — Encounter (HOSPITAL_COMMUNITY): Payer: Self-pay | Admitting: Cardiovascular Disease

## 2014-07-09 ENCOUNTER — Ambulatory Visit: Payer: Medicare HMO | Admitting: Cardiovascular Disease

## 2014-09-16 ENCOUNTER — Other Ambulatory Visit: Payer: Self-pay | Admitting: Cardiovascular Disease

## 2014-09-16 DIAGNOSIS — I739 Peripheral vascular disease, unspecified: Secondary | ICD-10-CM

## 2014-10-07 ENCOUNTER — Ambulatory Visit (HOSPITAL_COMMUNITY): Payer: Medicare HMO | Attending: Cardiovascular Disease

## 2014-10-07 ENCOUNTER — Ambulatory Visit (INDEPENDENT_AMBULATORY_CARE_PROVIDER_SITE_OTHER): Payer: Medicare HMO | Admitting: Cardiovascular Disease

## 2014-10-07 ENCOUNTER — Other Ambulatory Visit: Payer: Self-pay | Admitting: *Deleted

## 2014-10-07 VITALS — BP 146/75 | HR 82 | Wt 162.0 lb

## 2014-10-07 DIAGNOSIS — I2510 Atherosclerotic heart disease of native coronary artery without angina pectoris: Secondary | ICD-10-CM | POA: Diagnosis not present

## 2014-10-07 DIAGNOSIS — I739 Peripheral vascular disease, unspecified: Secondary | ICD-10-CM

## 2014-10-07 DIAGNOSIS — F17200 Nicotine dependence, unspecified, uncomplicated: Secondary | ICD-10-CM

## 2014-10-07 DIAGNOSIS — Z720 Tobacco use: Secondary | ICD-10-CM

## 2014-10-07 MED ORDER — LEVOTHYROXINE SODIUM 25 MCG PO TABS: 25.0000 ug | ORAL_TABLET | Freq: Every day | ORAL | Status: DC

## 2014-10-07 MED ORDER — CLOPIDOGREL BISULFATE 75 MG PO TABS: 75.0000 mg | ORAL_TABLET | Freq: Every day | ORAL | Status: DC

## 2014-10-07 MED ORDER — ROSUVASTATIN CALCIUM 20 MG PO TABS: 20.0000 mg | ORAL_TABLET | Freq: Every day | ORAL | Status: AC

## 2014-10-07 NOTE — Patient Instructions (Signed)
Medication Instructions:  Your physician has recommended you make the following change in your medication:  Start Crestor 20 mg by mouth daily at bedtime. Start synthroid 25 mcg by mouth daily every morning. Start Clopidogrel 75 mg by mouth daily.    Labwork:   Testing/Procedures:   Follow-Up: Your physician wants you to follow-up in: 6 months.  You will receive a reminder letter in the mail two months in advance. If you don't receive a letter, please call our office to schedule the follow-up appointment.   Any Other Special Instructions Will Be Listed Below (If Applicable).

## 2014-10-07 NOTE — Progress Notes (Signed)
Chief Complaint  Patient presents with  . Shortness of Breath    History of Present Illness: 69 yo WM with history of DM, HTN, CAD, HLD, Parkinson's Disease, ongoing tobacco abuse and PAD who is here today for cardiac and PV follow up. I saw him in February 2013 as a new PV patient. His cardiac issues have been followed by Dr. Lia Foyer. His PV issues had been followed in the [facility] and Vascular Center by Dr. Gwenlyn Found but he changed to our [facility] clinic in February 2013.  In April of 2012, he had ABI of 0.73 on the right and 0.84 on the left. Dr. Gwenlyn Found arranged a distal aortogram with bilateral LE runoff. The patient described life-style limiting claudication at that time. He was found to have a CTO of the left SFA with reconstituion by collaterals. He also had a severe 99% proximal right SFA stenosis treated with a self expanding stent. (7 x 40 Abbott Absolute Pro nitinol self-expanding stent. Postdilatation was performed with a 6 x 4 balloon at 2 atmospheres). Dr. Lia Foyer saw him in f/u in November 2012 and arranged repeat non-invasive imaging. Lower ext arterial dopplers on 05/30/11 with ABI of 0.63 on the right and 0.64 on the left. There were elevated velocities in the R SFA suggesting progression of disease in the area of the stent.  I arranged a distal aortogram with bilateral lower extremity runoff on 07/13/11. His left SFA CTO was unchanged with good collaterals. His proximal right SFA stent had a 99% stenosis in the distal portion of the stented segment. I performed a balloon angioplasty and had an unfavorable result so a 6.0 x 40 mm SMART Cordis self expanding stent was placed in an overlapping fashion with the previously placed stent. I then post-dilated with a 5.0 x 40 mm balloon and then hit the overlap with this balloon. There was an excellent result. The stenosis was taken from 95% within the stent to 0% and the stenosis beyond the stent was taken from 80% down to 0%. Last ABI 04/30/13  right 0.68, left 0.80.  Last carotids 02/06/13 with 40-59% RICA stenosis, 7-12% LICA stenosis. Cardiac history includes cath in April 2010 with 70% ostial LAD (FFR 0.94), 60% OM, 40% mid RCA. Dr. Lia Foyer has managed medically since then. LVEF normal during that cath. I saw him in March 2015 and he c/o chest pains and dyspnea at rest and with exertion. I arranged a cardiac cath at [facility]. He arrived for the cath but became angry when he was asked by nursing staff to remove his pants and he then left before he could have his cath.   He is here today for follow up. He has stopped taking all medications except for his EUCLID study drug. He states that he feels better off of all meds. He is willing to restart the statin, Synthroid and Plavix. His wife states that she cannot make him take his meds. He denies chest pain or SOB. He continues to smoke. He has no desire to stop. No leg pain. ABI stable today. Working in garden.   Primary Care Physician: [facility]   Past Medical History  Diagnosis Date  . Diabetes mellitus   . Hypertension   . Coronary artery disease   . Hypercholesterolemia   . SOB (shortness of breath)   . Anxiety   . Parkinson's disease   . Arthritis   . PAD (peripheral artery disease)     Past Surgical History  Procedure Laterality  Date  . Lower extremity angiogram N/A 07/13/2011    Procedure: LOWER EXTREMITY ANGIOGRAM;  Surgeon: Burnell Blanks, MD;  Location: [facility] CATH LAB;  Service: Cardiovascular;  Laterality: N/A;  . Percutaneous stent intervention Right 07/13/2011    Procedure: PERCUTANEOUS STENT INTERVENTION;  Surgeon: Burnell Blanks, MD;  Location: [facility] CATH LAB;  Service: Cardiovascular;  Laterality: Right;    Current Outpatient Prescriptions  Medication Sig Dispense Refill  . clopidogrel (PLAVIX) 75 MG tablet Take 1 tablet (75 mg total) by mouth daily. 30 tablet 11  . levothyroxine (SYNTHROID) 25 MCG tablet Take 1 tablet (25 mcg total) by  mouth daily before breakfast. 30 tablet 11  . rosuvastatin (CRESTOR) 20 MG tablet Take 1 tablet (20 mg total) by mouth daily. 30 tablet 11   No current facility-administered medications for this visit.    Allergies  Allergen Reactions  . Aspirin Other (See Comments)    Nose bleeds     History   Social History  . Marital Status: Married    Spouse Name: N/A  . Number of Children: N/A  . Years of Education: N/A   Occupational History  . Not on file.   Social History Main Topics  . Smoking status: Current Every Day Smoker -- 1.00 packs/day    Types: Cigarettes  . Smokeless tobacco: Not on file  . Alcohol Use: Not on file  . Drug Use: Not on file  . Sexual Activity: Not on file   Other Topics Concern  . Not on file   Social History Narrative    Family History  Problem Relation Age of Onset  . Restless legs syndrome Brother     Review of Systems:  As stated in the HPI and otherwise negative.   BP 146/75 mmHg  Pulse 82  Wt 162 lb (73.483 kg)  Physical Examination: General: Well developed, well nourished, NAD HEENT: OP clear, mucus membranes moist SKIN: warm, dry. No rashes. Neuro: No focal deficits Musculoskeletal: Muscle strength 5/5 all ext Psychiatric: Mood and affect normal Neck: No JVD, no carotid bruits, no thyromegaly, no lymphadenopathy. Lungs:Clear bilaterally, poor air movement. No wheezes, rhonci, crackles Cardiovascular: Regular rate and rhythm. No murmurs, gallops or rubs. Abdomen:Soft. Bowel sounds present. Non-tender.  Extremities: No lower extremity edema. Pulses are trace in the bilateral DP/PT.  Lower extremity arterial dopplers: today are stable.    Carotid artery dopplers:02/06/13:  40-59% RICA stenosis, 7-82% LICA stenosis. Patent vertebral arteries    Cardiac cath 09/04/08:  On plain fluoroscopy, there was fairly extensive calcification of  the coronary arteries.  2. The left main is free of critical disease. It trifurcates into a    small intermediate and larger circumflex and LAD.  3. The LAD courses to the apex. There are 2 moderate diagonal  branches and several septal perforators. The ostium of the LAD has  an eccentric wedge-like defect in the proximal vessel resulting in  reduction in lumen diameter, probably 60-70%. It potentially could  be tighter than this or less tight, and multiple views were done to  try to lay this out best. The remainder LAD coursed to the apex  and had luminal irregularities but no critical stenoses.  4. The circumflex provides a moderate-sized first marginal, and then a  smaller second and third marginals. Leading into the 2 smaller  marginals and into the larger marginal, there is a diffuse plaque  of about 40-50%. It does not appear to be high grade or flow  limiting. The distal  vessel is of moderate size, and a marginal is  not critically diseased. The other 2 vessels are relatively small  in caliber.  5. The right coronary artery is a moderate-sized vessel with moderate  calcification. There is an eccentric plaque in the mid vessel of  about 40%, posterolateral branch has 20-30% narrowing. There is  mild luminal irregularity elsewhere.  6. Ventriculography in the RAO projection reveals ejection fraction in  excess of 55%. There is 1+ mitral regurgitation.  EKG:  EKG is ordered today. The ekg ordered today demonstrates NSR, rate 79bpm  Recent Labs: No results found for requested labs within last 365 days.   Lipid Panel    Component Value Date/Time   CHOL 192 01/10/2011 1552   TRIG 191.0* 01/10/2011 1552   HDL 53.00 01/10/2011 1552   CHOLHDL 4 01/10/2011 1552   VLDL 38.2 01/10/2011 1552   LDLCALC 101* 01/10/2011 1552   LDLDIRECT 53.7 11/18/2009 0000     Wt Readings from Last 3 Encounters:  10/07/14 162 lb (73.483 kg)  12/16/13 167 lb (75.751 kg)  07/23/13 173 lb (78.472 kg)     Other studies Reviewed: Additional studies/ records that were reviewed today include:   Review of the above records demonstrates:    Assessment and Plan:   1. PAD:  He is enrolled in EUCLID and randomized to either Plavix or Brilinta but this ends today. His leg pain is stable. No rest pain or ulcerations. Known to have a CTO of the left SFA which is stable with stable ABI today. Will start Plavix daily. He will not take ASA.   2. Tobacco Abuse:  Smoking cessation advised. He does not wish to stop.   3. CAD:  Appears to be stable. He has no chest pain. He has stopped his statin. He is willing to restart. EKG is normal. As above, cardiac cath had been planned last year but he left the hospital in a rage before his cath. I do not think a cath is indicated at this time. He will call if he has a change in his clinical status.   4. Medication Non-compliance: I have spoken to him today about resuming all of his medications including Synthroid and his diabetes medications as well as statin. He says he feels better off of all meds but is willing to restart the statin, plavix and synthroid.    Current medicines are reviewed at length with the patient today.  The patient does not have concerns regarding medicines.  The following changes have been made:  no change  Labs/ tests ordered today include:   Orders Placed This Encounter  Procedures  . EKG 12-Lead    Disposition:   FU with me in 6 months  Signed, Lauree Chandler, MD 10/07/2014 [facility] [address], [place], [place]  [address] Phone: [phone]; Fax: [phone]

## 2014-12-30 ENCOUNTER — Encounter: Payer: Self-pay | Admitting: *Deleted

## 2014-12-30 DIAGNOSIS — Z006 Encounter for examination for normal comparison and control in clinical research program: Secondary | ICD-10-CM

## 2014-12-30 NOTE — Progress Notes (Signed)
STATUS FIRST Informed Consent   Subject Name:Matthew Wilkins   Subject met inclusion and exclusion criteria.The informed consent form, study requirements and expectations were reviewed with the subject and questions and concerns were addressed prior to the signing of the consent form. The subject verbalized understanding of the trail requirements. The subject agreed to participate in the STATUS FIRST research study and signed the informed consent at 12:49 on 12-30-14.The informed consent was obtained prior to performance of any protocol-specific procedures for the subject. A copy of the signed informed consent was given to the subject and a copy was placed in the subjects's medical record.    [name] 12/30/14 12:49

## 2015-02-20 DIAGNOSIS — S39011A Strain of muscle, fascia and tendon of abdomen, initial encounter: Secondary | ICD-10-CM | POA: Diagnosis not present

## 2015-03-31 DIAGNOSIS — Z23 Encounter for immunization: Secondary | ICD-10-CM | POA: Diagnosis not present

## 2015-04-27 ENCOUNTER — Encounter: Payer: Self-pay | Admitting: Cardiovascular Disease

## 2015-04-27 ENCOUNTER — Ambulatory Visit (INDEPENDENT_AMBULATORY_CARE_PROVIDER_SITE_OTHER): Payer: Medicare HMO | Admitting: Cardiovascular Disease

## 2015-04-27 VITALS — BP 100/56 | HR 77 | Ht 69.0 in | Wt 165.0 lb

## 2015-04-27 DIAGNOSIS — I2510 Atherosclerotic heart disease of native coronary artery without angina pectoris: Secondary | ICD-10-CM

## 2015-04-27 DIAGNOSIS — I739 Peripheral vascular disease, unspecified: Secondary | ICD-10-CM | POA: Diagnosis not present

## 2015-04-27 DIAGNOSIS — I779 Disorder of arteries and arterioles, unspecified: Secondary | ICD-10-CM | POA: Diagnosis not present

## 2015-04-27 DIAGNOSIS — R69 Illness, unspecified: Secondary | ICD-10-CM | POA: Diagnosis not present

## 2015-04-27 DIAGNOSIS — F17200 Nicotine dependence, unspecified, uncomplicated: Secondary | ICD-10-CM

## 2015-04-27 NOTE — Progress Notes (Signed)
Chief Complaint  Patient presents with  . Follow-up    CAD    History of Present Illness: 69 yo WM with history of DM, HTN, CAD, HLD, Parkinson's Disease, ongoing tobacco abuse and PAD who is here today for cardiac and PV follow up. I saw him in February 2013 as a new PV patient. His cardiac issues had been followed by Dr. Lia Foyer. His PV issues had been followed in the [facility] by Dr. Gwenlyn Found but he changed to our [facility] clinic in February 2013.  In April of 2012, he had ABI of 0.73 on the right and 0.84 on the left. Dr. Gwenlyn Found arranged a distal aortogram with bilateral LE runoff. The patient described life-style limiting claudication at that time. He was found to have a CTO of the left SFA with reconstituion by collaterals. He also had a severe 99% proximal right SFA stenosis treated with a self expanding stent. (7 x 40 Abbott Absolute Pro nitinol self-expanding stent. Postdilatation was performed with a 6 x 4 balloon at 2 atmospheres). Dr. Lia Foyer saw him in f/u in November 2012 and arranged repeat non-invasive imaging. Lower ext arterial dopplers on 05/30/11 with ABI of 0.63 on the right and 0.64 on the left. There were elevated velocities in the R SFA suggesting progression of disease in the area of the stent.  I arranged a distal aortogram with bilateral lower extremity runoff on 07/13/11. His left SFA CTO was unchanged with good collaterals. His proximal right SFA stent had a 99% stenosis in the distal portion of the stented segment. I performed a balloon angioplasty and had an unfavorable result so a 6.0 x 40 mm SMART Cordis self expanding stent was placed in an overlapping fashion with the previously placed stent. I then post-dilated with a 5.0 x 40 mm balloon and then hit the overlap with this balloon. There was an excellent result. The stenosis was taken from 95% within the stent to 0% and the stenosis beyond the stent was taken from 80% down to 0%. Last ABI 04/30/13 right  0.68, left 0.80.  Last carotids 02/06/13 with 40-59% RICA stenosis, 7-02% LICA stenosis. Cardiac history includes cath in April 2010 with 70% ostial LAD (FFR 0.94), 60% OM, 40% mid RCA. Dr. Lia Foyer has managed medically since then. LVEF normal during that cath. I saw him in March 2015 and he c/o chest pains and dyspnea at rest and with exertion. I arranged a cardiac cath at [facility]. He arrived for the cath but became angry when he was asked by nursing staff to remove his pants and he then left before he could have his cath.   He is here today for follow up. He denies chest pain or SOB. He continues to smoke. He has no desire to stop. He has been having numbness in both feet with tingling but no pain in his calves when walking. He has no ulcerations.    Primary Care Physician: Kathryne Eriksson   Past Medical History  Diagnosis Date  . Diabetes mellitus   . Hypertension   . Coronary artery disease   . Hypercholesterolemia   . SOB (shortness of breath)   . Anxiety   . Parkinson's disease   . Arthritis   . PAD (peripheral artery disease) [facility])     Past Surgical History  Procedure Laterality Date  . Lower extremity angiogram N/A 07/13/2011    Procedure: LOWER EXTREMITY ANGIOGRAM;  Surgeon: Burnell Blanks, MD;  Location: [facility] CATH LAB;  Service: Cardiovascular;  Laterality: N/A;  . Percutaneous stent intervention Right 07/13/2011    Procedure: PERCUTANEOUS STENT INTERVENTION;  Surgeon: Burnell Blanks, MD;  Location: [facility] CATH LAB;  Service: Cardiovascular;  Laterality: Right;    Current Outpatient Prescriptions  Medication Sig Dispense Refill  . clopidogrel (PLAVIX) 75 MG tablet Take 1 tablet (75 mg total) by mouth daily. 30 tablet 11  . glipiZIDE (GLUCOTROL XL) 5 MG 24 hr tablet Take 5 mg by mouth daily.    [name] levothyroxine (SYNTHROID) 25 MCG tablet Take 1 tablet (25 mcg total) by mouth daily before breakfast. 30 tablet 11  . pantoprazole (PROTONIX) 40 MG tablet Take 40 mg by mouth daily.     [name] rOPINIRole (REQUIP) 3 MG tablet Take 3 mg by mouth.    . rosuvastatin (CRESTOR) 20 MG tablet Take 1 tablet (20 mg total) by mouth daily. 30 tablet 11  . ZETIA 10 MG tablet Take 10 mg by mouth daily.     No current facility-administered medications for this visit.    Allergies  Allergen Reactions  . Aspirin Other (See Comments)    Nose bleeds     Social History   Social History  . Marital Status: Married    Spouse Name: N/A  . Number of Children: N/A  . Years of Education: N/A   Occupational History  . Not on file.   Social History Main Topics  . Smoking status: Current Every Day Smoker -- 1.00 packs/day    Types: Cigarettes  . Smokeless tobacco: Not on file  . Alcohol Use: Not on file  . Drug Use: Not on file  . Sexual Activity: Not on file   Other Topics Concern  . Not on file   Social History Narrative    Family History  Problem Relation Age of Onset  . Restless legs syndrome Brother     Review of Systems:  As stated in the HPI and otherwise negative.   BP 100/56 mmHg  Pulse 77  Ht '5\' 9"'$  (1.753 m)  Wt 165 lb (74.844 kg)  BMI 24.36 kg/m2  SpO2 99%  Physical Examination: General: Well developed, well nourished, NAD HEENT: OP clear, mucus membranes moist SKIN: warm, dry. No rashes. Neuro: No focal deficits Musculoskeletal: Muscle strength 5/5 all ext Psychiatric: Mood and affect normal Neck: No JVD, no carotid bruits, no thyromegaly, no lymphadenopathy. Lungs:Clear bilaterally, poor air movement. No wheezes, rhonci, crackles Cardiovascular: Regular rate and rhythm. No murmurs, gallops or rubs. Abdomen:Soft. Bowel sounds present. Non-tender.  Extremities: No lower extremity edema. Pulses are trace in the bilateral DP/PT.  Carotid artery dopplers:02/06/13:  40-59% RICA stenosis, 5-42% LICA stenosis. Patent vertebral arteries    Cardiac cath 09/04/08:  On plain fluoroscopy, there was fairly extensive calcification of  the coronary arteries.  2.  The left main is free of critical disease. It trifurcates into a  small intermediate and larger circumflex and LAD.  3. The LAD courses to the apex. There are 2 moderate diagonal  branches and several septal perforators. The ostium of the LAD has  an eccentric wedge-like defect in the proximal vessel resulting in  reduction in lumen diameter, probably 60-70%. It potentially could  be tighter than this or less tight, and multiple views were done to  try to lay this out best. The remainder LAD coursed to the apex  and had luminal irregularities but no critical stenoses.  4. The circumflex provides a moderate-sized first marginal, and then a  smaller second and  third marginals. Leading into the 2 smaller  marginals and into the larger marginal, there is a diffuse plaque  of about 40-50%. It does not appear to be high grade or flow  limiting. The distal vessel is of moderate size, and a marginal is  not critically diseased. The other 2 vessels are relatively small  in caliber.  5. The right coronary artery is a moderate-sized vessel with moderate  calcification. There is an eccentric plaque in the mid vessel of  about 40%, posterolateral branch has 20-30% narrowing. There is  mild luminal irregularity elsewhere.  6. Ventriculography in the RAO projection reveals ejection fraction in  excess of 55%. There is 1+ mitral regurgitation.  EKG:  EKG is not ordered today. The ekg ordered today demonstrates   Recent Labs: No results found for requested labs within last 365 days.     Wt Readings from Last 3 Encounters:  04/27/15 165 lb (74.844 kg)  10/07/14 162 lb (73.483 kg)  12/16/13 167 lb (75.751 kg)     Other studies Reviewed: Additional studies/ records that were reviewed today include:  Review of the above records demonstrates:    Assessment and Plan:   1. PAD: Recent numbness in both feet. Will repeat ABI now. He is on Plavix. Known to have a CTO of the left SFA with good  collaterals and prior stenting of the right SFA.    2. Tobacco Abuse:  Smoking cessation advised. He does not wish to stop.   3. CAD:  Appears to be stable. He has no chest pain. He has resumed his Plavix and statin. As above, cardiac cath had been planned last year but he left the hospital in a rage before his cath. I do not think a cath is indicated at this time. He will call if he has a change in his clinical status.   4. Carotid artery disease: We did not discuss today but he needs repeat dopplers. Will arrange at f/u visit.     Current medicines are reviewed at length with the patient today.  The patient does not have concerns regarding medicines.  The following changes have been made:  no change  Labs/ tests ordered today include:   No orders of the defined types were placed in this encounter.    Disposition:   FU with me in 6 months  Signed, Lauree Chandler, MD 04/27/2015 3:51 PM    [facility] [place], [place], [place]  [address] Phone: [phone]; Fax: [phone]

## 2015-04-27 NOTE — Patient Instructions (Signed)
Medication Instructions:  Your physician recommends that you continue on your current medications as directed. Please refer to the Current Medication list given to you today.   Labwork: none  Testing/Procedures: Your physician has requested that you have a lower or upper extremity arterial duplex. This test is an ultrasound of the arteries in the legs or arms. It looks at arterial blood flow in the legs and arms. Allow one hour for Lower and Upper Arterial scans. There are no restrictions or special instructions   Follow-Up: Your physician wants you to follow-up in: 6 months.  You will receive a reminder letter in the mail two months in advance. If you don't receive a letter, please call our office to schedule the follow-up appointment.   Any Other Special Instructions Will Be Listed Below (If Applicable).     If you need a refill on your cardiac medications before your next appointment, please call your pharmacy.

## 2015-05-04 ENCOUNTER — Encounter (HOSPITAL_COMMUNITY): Payer: Medicare HMO

## 2015-05-30 ENCOUNTER — Emergency Department (HOSPITAL_COMMUNITY)
Admission: EM | Admit: 2015-05-30 | Discharge: 2015-05-30 | Discharge status: Against Medical Advice | Payer: Medicare HMO | Attending: Emergency Medicine | Admitting: Emergency Medicine

## 2015-05-30 ENCOUNTER — Encounter (HOSPITAL_COMMUNITY): Payer: Self-pay

## 2015-05-30 ENCOUNTER — Emergency Department (HOSPITAL_COMMUNITY): Payer: Medicare HMO

## 2015-05-30 DIAGNOSIS — Z8739 Personal history of other diseases of the musculoskeletal system and connective tissue: Secondary | ICD-10-CM | POA: Insufficient documentation

## 2015-05-30 DIAGNOSIS — R05 Cough: Secondary | ICD-10-CM | POA: Diagnosis not present

## 2015-05-30 DIAGNOSIS — Z79899 Other long term (current) drug therapy: Secondary | ICD-10-CM | POA: Insufficient documentation

## 2015-05-30 DIAGNOSIS — Z8659 Personal history of other mental and behavioral disorders: Secondary | ICD-10-CM | POA: Insufficient documentation

## 2015-05-30 DIAGNOSIS — R0602 Shortness of breath: Secondary | ICD-10-CM | POA: Insufficient documentation

## 2015-05-30 DIAGNOSIS — R69 Illness, unspecified: Secondary | ICD-10-CM | POA: Diagnosis not present

## 2015-05-30 DIAGNOSIS — F17210 Nicotine dependence, cigarettes, uncomplicated: Secondary | ICD-10-CM | POA: Diagnosis not present

## 2015-05-30 DIAGNOSIS — Z7902 Long term (current) use of antithrombotics/antiplatelets: Secondary | ICD-10-CM | POA: Diagnosis not present

## 2015-05-30 DIAGNOSIS — I2510 Atherosclerotic heart disease of native coronary artery without angina pectoris: Secondary | ICD-10-CM | POA: Diagnosis not present

## 2015-05-30 DIAGNOSIS — R109 Unspecified abdominal pain: Secondary | ICD-10-CM | POA: Diagnosis not present

## 2015-05-30 DIAGNOSIS — G8929 Other chronic pain: Secondary | ICD-10-CM | POA: Diagnosis not present

## 2015-05-30 DIAGNOSIS — E7800 Pure hypercholesterolemia, unspecified: Secondary | ICD-10-CM | POA: Diagnosis not present

## 2015-05-30 DIAGNOSIS — R079 Chest pain, unspecified: Secondary | ICD-10-CM | POA: Diagnosis not present

## 2015-05-30 DIAGNOSIS — Z8719 Personal history of other diseases of the digestive system: Secondary | ICD-10-CM | POA: Insufficient documentation

## 2015-05-30 DIAGNOSIS — E119 Type 2 diabetes mellitus without complications: Secondary | ICD-10-CM | POA: Diagnosis not present

## 2015-05-30 DIAGNOSIS — R0789 Other chest pain: Secondary | ICD-10-CM | POA: Diagnosis not present

## 2015-05-30 DIAGNOSIS — Z7984 Long term (current) use of oral hypoglycemic drugs: Secondary | ICD-10-CM | POA: Insufficient documentation

## 2015-05-30 DIAGNOSIS — Z9889 Other specified postprocedural states: Secondary | ICD-10-CM | POA: Diagnosis not present

## 2015-05-30 DIAGNOSIS — I10 Essential (primary) hypertension: Secondary | ICD-10-CM | POA: Insufficient documentation

## 2015-05-30 LAB — CBC
HCT: 49.7 % (ref 39.0–52.0)
HEMOGLOBIN: 17.6 g/dL — AB (ref 13.0–17.0)
MCH: 31 pg (ref 26.0–34.0)
MCHC: 35.4 g/dL (ref 30.0–36.0)
MCV: 87.7 fL (ref 78.0–100.0)
Platelets: 201 10*3/uL (ref 150–400)
RBC: 5.67 MIL/uL (ref 4.22–5.81)
RDW: 13.1 % (ref 11.5–15.5)
WBC: 8.6 10*3/uL (ref 4.0–10.5)

## 2015-05-30 LAB — COMPREHENSIVE METABOLIC PANEL
ALBUMIN: 4.7 g/dL (ref 3.5–5.0)
ALT: 19 U/L (ref 17–63)
ANION GAP: 11 (ref 5–15)
AST: 19 U/L (ref 15–41)
Alkaline Phosphatase: 98 U/L (ref 38–126)
BUN: 20 mg/dL (ref 6–20)
CO2: 23 mmol/L (ref 22–32)
Calcium: 9.8 mg/dL (ref 8.9–10.3)
Chloride: 102 mmol/L (ref 101–111)
Creatinine, Ser: 0.95 mg/dL (ref 0.61–1.24)
GFR calc Af Amer: >60 mL/min (ref >60)
GFR calc non Af Amer: >60 mL/min (ref >60)
GLUCOSE: 377 mg/dL — AB (ref 65–99)
POTASSIUM: 3.6 mmol/L (ref 3.5–5.1)
SODIUM: 136 mmol/L (ref 135–145)
Total Bilirubin: 0.6 mg/dL (ref 0.3–1.2)
Total Protein: 7.6 g/dL (ref 6.5–8.1)

## 2015-05-30 LAB — TROPONIN I

## 2015-05-30 LAB — PROTIME-INR
INR: 0.98 (ref 0.00–1.49)
Prothrombin Time: 13.2 seconds (ref 11.6–15.2)

## 2015-05-30 LAB — I-STAT TROPONIN, ED: Troponin i, poc: 0 ng/mL (ref 0.00–0.08)

## 2015-05-30 LAB — APTT: APTT: 29 s (ref 24–37)

## 2015-05-30 MED ORDER — NITROGLYCERIN 0.4 MG SL SUBL: 0.4000 mg | SUBLINGUAL_TABLET | SUBLINGUAL | Status: DC | PRN

## 2015-05-30 MED ORDER — ASPIRIN 81 MG PO CHEW
324.0000 mg | CHEWABLE_TABLET | Freq: Once | ORAL | Status: DC
Start: 2015-05-30 — End: 2015-05-31
  Filled 2015-05-30: qty 4

## 2015-05-30 NOTE — ED Notes (Signed)
Chest pain and shortness of breath. Have been hurting for the past couple of days. Pain comes and goes. History of hiatal hernia.

## 2015-05-30 NOTE — ED Notes (Signed)
Pt states he will not go to [employer] that he will go tomorrow some time.  Signed AMA.

## 2015-05-30 NOTE — ED Provider Notes (Signed)
CSN: [id number]     Arrival date & time 05/30/15  2012 History  By signing my name below, I, Rayna Sexton, attest that this documentation has been prepared under the direction and in the presence of Noemi Chapel, MD. Electronically Signed: Rayna Sexton, ED Scribe. 05/30/2015. 8:38 PM.   Chief Complaint  Patient presents with  . Chest Pain   The history is provided by the patient and the spouse. No language interpreter was used.    HPI Comments: Matthew Wilkins is a 70 y.o. male with a PMHx of DM, HTN, CAD, HCL, SOB and PAD who presents to the Emergency Department complaining of intermittent, moderate, sometimes sharp and currently heavy, diffuse, CP with onset 3 days ago. Pt notes a hx of hiatal hernia with chronic abd pain noting that his current pain is dissimilar and radiates throughout his chest. He notes associated SOB and chest tightness. He notes a hx of multiple "mini strokes" but denies having ever seen a doctor for these occurences. Pt notes being on Plavix for the past 20 years. He confirms that he was supposed to have heart catheretization put in place and decided to not have one done against his cardiologist's recommendations. Pt notes having had a stress test done about 4 years ago. He denies any hx of MI. Pt denies leg swelling.   Most recent heart catheterization was in 2010 with 60% to 70% LAD lesion. Circumflex 60% lesion. Right coronary 40%. Injection fraction 55% with mild MR. Cardiac cath had been planned last month and pt "left in a rage" before his cath was put in place.    Past Medical History  Diagnosis Date  . Diabetes mellitus   . Hypertension   . Coronary artery disease   . Hypercholesterolemia   . SOB (shortness of breath)   . Anxiety   . Arthritis   . PAD (peripheral artery disease) [facility])    Past Surgical History  Procedure Laterality Date  . Lower extremity angiogram N/A 07/13/2011    Procedure: LOWER EXTREMITY ANGIOGRAM;  Surgeon: Burnell Blanks, MD;  Location: [facility] CATH LAB;  Service: Cardiovascular;  Laterality: N/A;  . Percutaneous stent intervention Right 07/13/2011    Procedure: PERCUTANEOUS STENT INTERVENTION;  Surgeon: Burnell Blanks, MD;  Location: [facility] CATH LAB;  Service: Cardiovascular;  Laterality: Right;   Family History  Problem Relation Age of Onset  . Restless legs syndrome Brother    Social History  Substance Use Topics  . Smoking status: Current Every Day Smoker -- 1.00 packs/day    Types: Cigarettes  . Smokeless tobacco: None  . Alcohol Use: No    Review of Systems  Respiratory: Positive for chest tightness and shortness of breath.   Cardiovascular: Positive for chest pain. Negative for leg swelling.  Gastrointestinal: Positive for abdominal pain.  All other systems reviewed and are negative.   Allergies  Aspirin  Home Medications   Prior to Admission medications   Medication Sig Start Date End Date Taking? Authorizing Provider  clopidogrel (PLAVIX) 75 MG tablet Take 1 tablet (75 mg total) by mouth daily. 10/07/14  Yes Burnell Blanks, MD  glipiZIDE (GLUCOTROL XL) 5 MG 24 hr tablet Take 5 mg by mouth daily. 03/11/15  Yes Historical Provider, MD  levothyroxine (SYNTHROID) 25 MCG tablet Take 1 tablet (25 mcg total) by mouth daily before breakfast. 10/07/14  Yes Burnell Blanks, MD  metFORMIN (GLUCOPHAGE-XR) 500 MG 24 hr tablet Take 500 mg by mouth 2 (two) times daily.  07/26/13  Yes Historical Provider, MD  pantoprazole (PROTONIX) 40 MG tablet Take 40 mg by mouth daily. 04/22/15  Yes Historical Provider, MD  rOPINIRole (REQUIP) 3 MG tablet Take 3 mg by mouth 4 (four) times daily as needed (Restless Leg).  04/10/15  Yes Historical Provider, MD  rosuvastatin (CRESTOR) 20 MG tablet Take 1 tablet (20 mg total) by mouth daily. 10/07/14  Yes Burnell Blanks, MD  tiotropium (SPIRIVA) 18 MCG inhalation capsule Place 18 mcg into inhaler and inhale daily as needed (Shortness Of Breath).    Yes Historical Provider, MD  ZETIA 10 MG tablet Take 10 mg by mouth daily. 04/03/15  Yes Historical Provider, MD   BP 150/78 mmHg  Pulse 97  Temp(Src) 98 F (36.7 C) (Oral)  Resp 20  Ht '5\' 9"'$  (1.753 m)  Wt 160 lb (72.576 kg)  BMI 23.62 kg/m2  SpO2 100%    Physical Exam  Constitutional: He is oriented to person, place, and time. He appears well-developed and well-nourished.  HENT:  Head: Normocephalic and atraumatic.  Eyes: EOM are normal.  Neck: Normal range of motion.  Cardiovascular: Normal rate, regular rhythm, normal heart sounds and intact distal pulses.   No murmur heard. No murmurs and no peripheral edema  Pulmonary/Chest: Effort normal and breath sounds normal. Tachypnea noted. No respiratory distress. He has no wheezes. He has no rales.  Mild tachypnea with clear lungs  Abdominal: Soft. He exhibits no distension. There is no tenderness.  Musculoskeletal: Normal range of motion. He exhibits no edema.  Neurological: He is alert and oriented to person, place, and time.  Skin: Skin is warm and dry.  Psychiatric: He has a normal mood and affect. Judgment normal.  Nursing note and vitals reviewed.   ED Course  Procedures  COORDINATION OF CARE: 8:32 PM Discussed next steps with pt and he agreed to the plan.   Labs Review Labs Reviewed  CBC - Abnormal; Notable for the following:    Hemoglobin 17.6 (*)    All other components within normal limits  COMPREHENSIVE METABOLIC PANEL - Abnormal; Notable for the following:    Glucose, Bld 377 (*)    All other components within normal limits  APTT  PROTIME-INR  TROPONIN I  I-STAT TROPOININ, ED    Imaging Review Dg Chest Portable 1 View  05/30/2015  CLINICAL DATA:  Central chest pain and shortness of breath for 2-3 days, chronic smoker's cough, hypertension, coronary artery disease post stenting, diabetes mellitus EXAM: PORTABLE CHEST 1 VIEW COMPARISON:  Portable exam 2041 hours compared to 08/19/2010 FINDINGS: Lordotic  technique. Normal heart size, mediastinal contours, and pulmonary vascularity. Lungs mildly hyperinflated but clear. No pleural effusion or pneumothorax. Bones unremarkable. IMPRESSION: No acute abnormalities. Electronically Signed   By: Lavonia Dana M.D.   On: 05/30/2015 20:53   I have personally reviewed and evaluated these images and lab results as part of my medical decision-making.   EKG Interpretation   Date/Time:  Saturday May 30 2015 20:17:56 EST Ventricular Rate:  91 PR Interval:  151 QRS Duration: 89 QT Interval:  376 QTC Calculation: 463 R Axis:   77 Text Interpretation:  Sinus rhythm Atrial premature complex Nonspecific T  abnormalities, lateral leads Baseline wander in lead(s) V1 since last  tracing no significant change Confirmed by Noeh Sparacino  MD, Alick Lecomte ([id number]) on  05/30/2015 8:23:04 PM      MDM   Final diagnoses:  Chest pain, unspecified chest pain type    The patient is at increased  risk for coronary disease, he does have diabetes, he is already on Plavix, he has been having intermittent fluctuating chest pain, currently having mild pain, he has taken aspirin, nitroglycerin, has a chest x-ray which is nondiagnostic and a troponin which is normal. His other labs appear unremarkable, I am suspicious of the cause of the symptoms given his history. I have reviewed his last heart catheterization from 2010, it is included above. I will discuss with cardiology at [facility] prior to making decision on location of admission.  The patient has decided to leave [place], I have had extensive discussion with the patient regarding this, I have even discussed his care with the cardiologist at [facility] who has agreed to accept the patient in transfer. The patient refuses after over 10 minutes of consultation, discussion and trying to convince and urge the patient that he is making a mistake. Nevertheless the patient is able to state the potential consequences of  leaving [place] and refuses to stay.   Noemi Chapel, MD 05/30/15 2202

## 2015-05-30 NOTE — Discharge Instructions (Signed)
I have strongly strongly recommended that you be admitted to the hospital tonight, you have refused, you may die from your chest pain if this is a heart attack, you have said that you are willing to accept this risk. You may return at any time.

## 2015-06-03 DIAGNOSIS — I2510 Atherosclerotic heart disease of native coronary artery without angina pectoris: Secondary | ICD-10-CM | POA: Diagnosis not present

## 2015-06-03 DIAGNOSIS — K469 Unspecified abdominal hernia without obstruction or gangrene: Secondary | ICD-10-CM | POA: Diagnosis not present

## 2015-06-03 NOTE — Progress Notes (Signed)
Chief Complaint  Patient presents with  . Follow-up    CAD, PAD    History of Present Illness: 70 yo WM with history of DM, HTN, CAD, HLD, Parkinson's Disease, ongoing tobacco abuse and PAD who is here today for cardiac and PV follow up. I saw him in February 2013 as a new PV patient. His cardiac issues had been followed by Dr. Lia Foyer. His PV issues had been followed in the [facility] by Dr. Gwenlyn Found but he changed to our [facility] clinic in February 2013.  In April of 2012, he had ABI of 0.73 on the right and 0.84 on the left. Dr. Gwenlyn Found arranged a distal aortogram with bilateral LE runoff. The patient described life-style limiting claudication at that time. He was found to have a CTO of the left SFA with reconstituion by collaterals. He also had a severe 99% proximal right SFA stenosis treated with a self expanding stent. (7 x 40 Abbott Absolute Pro nitinol self-expanding stent. Postdilatation was performed with a 6 x 4 balloon at 2 atmospheres). Dr. Lia Foyer saw him in f/u in November 2012 and arranged repeat non-invasive imaging. Lower ext arterial dopplers on 05/30/11 with ABI of 0.63 on the right and 0.64 on the left. There were elevated velocities in the R SFA suggesting progression of disease in the area of the stent.  I arranged a distal aortogram with bilateral lower extremity runoff on 07/13/11. His left SFA CTO was unchanged with good collaterals. His proximal right SFA stent had a 99% stenosis in the distal portion of the stented segment. I performed a balloon angioplasty and had an unfavorable result so a 6.0 x 40 mm SMART Cordis self expanding stent was placed in an overlapping fashion with the previously placed stent. I then post-dilated with a 5.0 x 40 mm balloon and then hit the overlap with this balloon. There was an excellent result. The stenosis was taken from 95% within the stent to 0% and the stenosis beyond the stent was taken from 80% down to 0%. Last ABI 04/30/13  right 0.68, left 0.80.  Last carotids 02/06/13 with 40-59% RICA stenosis, 7-82% LICA stenosis. Cardiac history includes cath in April 2010 with 70% ostial LAD (FFR 0.94), 60% OM, 40% mid RCA. Dr. Lia Foyer has managed medically since then. LVEF normal during that cath. I saw him in March 2015 and he c/o chest pains and dyspnea at rest and with exertion. I arranged a cardiac cath at [facility]. He arrived for the cath but became angry when he was asked by nursing staff to remove his pants and he then left before he could have his cath. I saw him in the office December 2016 and he was doing well except for numbness in both feet. ABI were arranged but he has not yet completed this testing. He was seen in the [facility] ED 05/30/15 with chest pain. Troponin was negative. EKG did not show ischemic changes. He left from the ED against medical advice after admission was recommended.   He is here today for follow up. He continues to smoke. He has no desire to stop. He has been having numbness in both feet with tingling but no pain in his calves when walking. He has no ulcerations.  He is refusing to have the LE dopplers as ordered at the last visit. He continues to have daily exertional chest pain.   Primary Care Physician: Kathryne Eriksson   Past Medical History  Diagnosis Date  . Diabetes  mellitus   . Hypertension   . Coronary artery disease   . Hypercholesterolemia   . SOB (shortness of breath)   . Anxiety   . Arthritis   . PAD (peripheral artery disease) [facility])     Past Surgical History  Procedure Laterality Date  . Lower extremity angiogram N/A 07/13/2011    Procedure: LOWER EXTREMITY ANGIOGRAM;  Surgeon: Burnell Blanks, MD;  Location: [facility] CATH LAB;  Service: Cardiovascular;  Laterality: N/A;  . Percutaneous stent intervention Right 07/13/2011    Procedure: PERCUTANEOUS STENT INTERVENTION;  Surgeon: Burnell Blanks, MD;  Location: [facility] CATH LAB;  Service: Cardiovascular;  Laterality: Right;     Current Outpatient Prescriptions  Medication Sig Dispense Refill  . clopidogrel (PLAVIX) 75 MG tablet Take 1 tablet (75 mg total) by mouth daily. 30 tablet 11  . glipiZIDE (GLUCOTROL XL) 5 MG 24 hr tablet Take 5 mg by mouth daily.    [name] levothyroxine (SYNTHROID) 25 MCG tablet Take 1 tablet (25 mcg total) by mouth daily before breakfast. 30 tablet 11  . lisinopril (PRINIVIL,ZESTRIL) 5 MG tablet Take 5 mg by mouth daily.    . metFORMIN (GLUCOPHAGE-XR) 500 MG 24 hr tablet Take 500 mg by mouth 2 (two) times daily.    . pantoprazole (PROTONIX) 40 MG tablet Take 40 mg by mouth daily.    [name] rOPINIRole (REQUIP) 3 MG tablet Take 3 mg by mouth 4 (four) times daily as needed (Restless Leg).     . rosuvastatin (CRESTOR) 20 MG tablet Take 1 tablet (20 mg total) by mouth daily. 30 tablet 11  . tiotropium (SPIRIVA) 18 MCG inhalation capsule Place 18 mcg into inhaler and inhale daily as needed (Shortness Of Breath).    [name] ZETIA 10 MG tablet Take 10 mg by mouth daily.     No current facility-administered medications for this visit.    Allergies  Allergen Reactions  . Aspirin Other (See Comments)    Nose bleeds     Social History   Social History  . Marital Status: Married    Spouse Name: N/A  . Number of Children: N/A  . Years of Education: N/A   Occupational History  . Not on file.   Social History Main Topics  . Smoking status: Current Every Day Smoker -- 1.00 packs/day    Types: Cigarettes  . Smokeless tobacco: Not on file  . Alcohol Use: No  . Drug Use: No  . Sexual Activity: Not on file   Other Topics Concern  . Not on file   Social History Narrative    Family History  Problem Relation Age of Onset  . Restless legs syndrome Brother     Review of Systems:  As stated in the HPI and otherwise negative.   BP 126/60 mmHg  Pulse 84  Ht '5\' 9"'$  (1.753 m)  Wt 165 lb (74.844 kg)  BMI 24.36 kg/m2  SpO2 98%  Physical Examination: General: Well developed, well nourished,  NAD HEENT: OP clear, mucus membranes moist SKIN: warm, dry. No rashes. Neuro: No focal deficits Musculoskeletal: Muscle strength 5/5 all ext Psychiatric: Mood and affect normal Neck: No JVD, no carotid bruits, no thyromegaly, no lymphadenopathy. Lungs:Clear bilaterally, poor air movement. No wheezes, rhonci, crackles Cardiovascular: Regular rate and rhythm. No murmurs, gallops or rubs. Abdomen:Soft. Bowel sounds present. Non-tender.  Extremities: No lower extremity edema. Pulses are trace in the bilateral DP/PT.  Carotid artery dopplers:02/06/13:  40-59% RICA stenosis, 7-56% LICA stenosis. Patent vertebral arteries  Cardiac cath 09/04/08:  On plain fluoroscopy, there was fairly extensive calcification of  the coronary arteries.  2. The left main is free of critical disease. It trifurcates into a  small intermediate and larger circumflex and LAD.  3. The LAD courses to the apex. There are 2 moderate diagonal  branches and several septal perforators. The ostium of the LAD has  an eccentric wedge-like defect in the proximal vessel resulting in  reduction in lumen diameter, probably 60-70%. It potentially could  be tighter than this or less tight, and multiple views were done to  try to lay this out best. The remainder LAD coursed to the apex  and had luminal irregularities but no critical stenoses.  4. The circumflex provides a moderate-sized first marginal, and then a  smaller second and third marginals. Leading into the 2 smaller  marginals and into the larger marginal, there is a diffuse plaque  of about 40-50%. It does not appear to be high grade or flow  limiting. The distal vessel is of moderate size, and a marginal is  not critically diseased. The other 2 vessels are relatively small  in caliber.  5. The right coronary artery is a moderate-sized vessel with moderate  calcification. There is an eccentric plaque in the mid vessel of  about 40%, posterolateral branch has 20-30%  narrowing. There is  mild luminal irregularity elsewhere.  6. Ventriculography in the RAO projection reveals ejection fraction in  excess of 55%. There is 1+ mitral regurgitation.  EKG:  EKG is not ordered today. The ekg ordered today demonstrates   Recent Labs: 05/30/2015: ALT 19; BUN 20; Creatinine, Ser 0.95; Hemoglobin 17.6*; Platelets 201; Potassium 3.6; Sodium 136     Wt Readings from Last 3 Encounters:  06/04/15 165 lb (74.844 kg)  05/30/15 160 lb (72.576 kg)  04/27/15 165 lb (74.844 kg)     Other studies Reviewed: Additional studies/ records that were reviewed today include:  Review of the above records demonstrates:    Assessment and Plan:   1. PAD: He is on Plavix. Known to have a CTO of the left SFA with good collaterals and prior stenting of the right SFA. He is refusing to have LE dopplers.    2. Tobacco Abuse:  Smoking cessation advised. He does not wish to stop.   3. CAD:  Recent episodes of chest pain concerning for unstable angina. He has resumed his Plavix and statin. As above, cardiac cath had been planned last year but he left the hospital in a rage before his cath. He is now agreeable to a cardiac cath. Will plan next week at [facility]. Risks and benefits reviewed with pt.   4. Carotid artery disease: Last dopplers 7/13 with 40-59% RICA stenosis and 0-93% LICA stenosis. He needs repeat dopplers. He has not agreed to repeat testing.   Current medicines are reviewed at length with the patient today.  The patient does not have concerns regarding medicines.  The following changes have been made:  no change  Labs/ tests ordered today include:   No orders of the defined types were placed in this encounter.    Disposition:   FU with me after his cath.   Signed, Lauree Chandler, MD 06/04/2015 2:43 PM    [address], [place], [place]  [address] Phone: [phone]; Fax: [phone]

## 2015-06-04 ENCOUNTER — Ambulatory Visit (INDEPENDENT_AMBULATORY_CARE_PROVIDER_SITE_OTHER): Payer: Medicare HMO | Admitting: Cardiovascular Disease

## 2015-06-04 ENCOUNTER — Encounter: Payer: Self-pay | Admitting: Cardiovascular Disease

## 2015-06-04 ENCOUNTER — Encounter: Payer: Self-pay | Admitting: *Deleted

## 2015-06-04 VITALS — BP 126/60 | HR 84 | Ht 69.0 in | Wt 165.0 lb

## 2015-06-04 DIAGNOSIS — I779 Disorder of arteries and arterioles, unspecified: Secondary | ICD-10-CM | POA: Diagnosis not present

## 2015-06-04 DIAGNOSIS — F17200 Nicotine dependence, unspecified, uncomplicated: Secondary | ICD-10-CM

## 2015-06-04 DIAGNOSIS — R69 Illness, unspecified: Secondary | ICD-10-CM | POA: Diagnosis not present

## 2015-06-04 DIAGNOSIS — I25110 Atherosclerotic heart disease of native coronary artery with unstable angina pectoris: Secondary | ICD-10-CM | POA: Diagnosis not present

## 2015-06-04 DIAGNOSIS — I739 Peripheral vascular disease, unspecified: Secondary | ICD-10-CM | POA: Diagnosis not present

## 2015-06-04 NOTE — Patient Instructions (Signed)
Medication Instructions:  Your physician recommends that you continue on your current medications as directed. Please refer to the Current Medication list given to you today.   Labwork: none  Testing/Procedures: Your physician has requested that you have a cardiac catheterization. Cardiac catheterization is used to diagnose and/or treat various heart conditions. Doctors may recommend this procedure for a number of different reasons. The most common reason is to evaluate chest pain. Chest pain can be a symptom of coronary artery disease (CAD), and cardiac catheterization can show whether plaque is narrowing or blocking your heart's arteries. This procedure is also used to evaluate the valves, as well as measure the blood flow and oxygen levels in different parts of your heart. For further information please visit [email or web address]. Please follow instruction sheet, as given. Scheduled for January 25,2017    Follow-Up: Your physician recommends that you schedule a follow-up appointment in: about 4 weeks.  Scheduled for February 13,2017 at 2:15    Any Other Special Instructions Will Be Listed Below (If Applicable).     If you need a refill on your cardiac medications before your next appointment, please call your pharmacy.

## 2015-06-10 ENCOUNTER — Ambulatory Visit (HOSPITAL_COMMUNITY)
Admission: RE | Admit: 2015-06-10 | Discharge: 2015-06-10 | Disposition: A | Discharge status: Home / Self Care | Payer: Medicare HMO | Source: Ambulatory Visit | Attending: Cardiovascular Disease | Admitting: Cardiovascular Disease

## 2015-06-10 ENCOUNTER — Encounter (HOSPITAL_COMMUNITY)
Admission: RE | Disposition: A | Discharge status: Home / Self Care | Payer: Self-pay | Source: Ambulatory Visit | Attending: Cardiovascular Disease

## 2015-06-10 DIAGNOSIS — Z955 Presence of coronary angioplasty implant and graft: Secondary | ICD-10-CM | POA: Insufficient documentation

## 2015-06-10 DIAGNOSIS — F419 Anxiety disorder, unspecified: Secondary | ICD-10-CM | POA: Insufficient documentation

## 2015-06-10 DIAGNOSIS — M1990 Unspecified osteoarthritis, unspecified site: Secondary | ICD-10-CM | POA: Insufficient documentation

## 2015-06-10 DIAGNOSIS — F17210 Nicotine dependence, cigarettes, uncomplicated: Secondary | ICD-10-CM | POA: Insufficient documentation

## 2015-06-10 DIAGNOSIS — E785 Hyperlipidemia, unspecified: Secondary | ICD-10-CM | POA: Insufficient documentation

## 2015-06-10 DIAGNOSIS — E7800 Pure hypercholesterolemia, unspecified: Secondary | ICD-10-CM | POA: Insufficient documentation

## 2015-06-10 DIAGNOSIS — Z7984 Long term (current) use of oral hypoglycemic drugs: Secondary | ICD-10-CM | POA: Diagnosis not present

## 2015-06-10 DIAGNOSIS — E1151 Type 2 diabetes mellitus with diabetic peripheral angiopathy without gangrene: Secondary | ICD-10-CM | POA: Insufficient documentation

## 2015-06-10 DIAGNOSIS — G20 Parkinson's disease: Secondary | ICD-10-CM | POA: Diagnosis not present

## 2015-06-10 DIAGNOSIS — I25110 Atherosclerotic heart disease of native coronary artery with unstable angina pectoris: Secondary | ICD-10-CM | POA: Insufficient documentation

## 2015-06-10 DIAGNOSIS — Z7902 Long term (current) use of antithrombotics/antiplatelets: Secondary | ICD-10-CM | POA: Diagnosis not present

## 2015-06-10 DIAGNOSIS — I10 Essential (primary) hypertension: Secondary | ICD-10-CM | POA: Diagnosis not present

## 2015-06-10 DIAGNOSIS — I2510 Atherosclerotic heart disease of native coronary artery without angina pectoris: Secondary | ICD-10-CM | POA: Insufficient documentation

## 2015-06-10 DIAGNOSIS — R69 Illness, unspecified: Secondary | ICD-10-CM | POA: Diagnosis not present

## 2015-06-10 HISTORY — PX: CARDIAC CATHETERIZATION: SHX172

## 2015-06-10 LAB — GLUCOSE, CAPILLARY: Glucose-Capillary: 245 mg/dL — ABNORMAL HIGH (ref 65–99)

## 2015-06-10 SURGERY — LEFT HEART CATH AND CORONARY ANGIOGRAPHY

## 2015-06-10 MED ORDER — HEPARIN SODIUM (PORCINE) 1000 UNIT/ML IJ SOLN
INTRAMUSCULAR | Status: AC
  Filled 2015-06-10: qty 1

## 2015-06-10 MED ORDER — SODIUM CHLORIDE 0.9 % IV SOLN: 250.0000 mL | INTRAVENOUS | Status: DC | PRN

## 2015-06-10 MED ORDER — ASPIRIN 81 MG PO CHEW
CHEWABLE_TABLET | ORAL | Status: AC
  Filled 2015-06-10: qty 1

## 2015-06-10 MED ORDER — SODIUM CHLORIDE 0.9 % IJ SOLN: 3.0000 mL | Freq: Two times a day (BID) | INTRAMUSCULAR | Status: DC

## 2015-06-10 MED ORDER — HEPARIN SODIUM (PORCINE) 1000 UNIT/ML IJ SOLN
INTRAMUSCULAR | Status: DC | PRN
  Administered 2015-06-10: 4000 [IU] via INTRAVENOUS

## 2015-06-10 MED ORDER — VERAPAMIL HCL 2.5 MG/ML IV SOLN
INTRAVENOUS | Status: DC | PRN
  Administered 2015-06-10: 10 mL via INTRA_ARTERIAL

## 2015-06-10 MED ORDER — FENTANYL CITRATE (PF) 100 MCG/2ML IJ SOLN
INTRAMUSCULAR | Status: AC
  Filled 2015-06-10: qty 2

## 2015-06-10 MED ORDER — SODIUM CHLORIDE 0.9% FLUSH: 3.0000 mL | INTRAVENOUS | Status: DC | PRN

## 2015-06-10 MED ORDER — FENTANYL CITRATE (PF) 100 MCG/2ML IJ SOLN
INTRAMUSCULAR | Status: DC | PRN
  Administered 2015-06-10: 25 ug via INTRAVENOUS

## 2015-06-10 MED ORDER — HEPARIN (PORCINE) IN NACL 2-0.9 UNIT/ML-% IJ SOLN
INTRAMUSCULAR | Status: AC
  Filled 2015-06-10: qty 1000

## 2015-06-10 MED ORDER — SODIUM CHLORIDE 0.9% FLUSH: 3.0000 mL | Freq: Two times a day (BID) | INTRAVENOUS | Status: DC

## 2015-06-10 MED ORDER — MIDAZOLAM HCL 2 MG/2ML IJ SOLN
INTRAMUSCULAR | Status: AC
  Filled 2015-06-10: qty 2

## 2015-06-10 MED ORDER — LIDOCAINE HCL (PF) 1 % IJ SOLN
INTRAMUSCULAR | Status: AC
  Filled 2015-06-10: qty 30

## 2015-06-10 MED ORDER — SODIUM CHLORIDE 0.9 % IV SOLN
INTRAVENOUS | Status: AC
  Administered 2015-06-10: 09:00:00 via INTRAVENOUS

## 2015-06-10 MED ORDER — SODIUM CHLORIDE 0.9 % IJ SOLN: 3.0000 mL | INTRAMUSCULAR | Status: DC | PRN

## 2015-06-10 MED ORDER — SODIUM CHLORIDE 0.9 % IV SOLN: INTRAVENOUS | Status: AC

## 2015-06-10 MED ORDER — VERAPAMIL HCL 2.5 MG/ML IV SOLN
INTRAVENOUS | Status: AC
  Filled 2015-06-10: qty 2

## 2015-06-10 MED ORDER — LIDOCAINE HCL (PF) 1 % IJ SOLN
INTRAMUSCULAR | Status: DC | PRN
Start: 2015-06-10 — End: 2015-06-10
  Administered 2015-06-10: 11:00:00

## 2015-06-10 MED ORDER — IOHEXOL 350 MG/ML SOLN
INTRAVENOUS | Status: DC | PRN
  Administered 2015-06-10: 90 mL via INTRA_ARTERIAL

## 2015-06-10 MED ORDER — MIDAZOLAM HCL 2 MG/2ML IJ SOLN
INTRAMUSCULAR | Status: DC | PRN
  Administered 2015-06-10: 2 mg via INTRAVENOUS

## 2015-06-10 SURGICAL SUPPLY — 13 items
CATH INFINITI 5 FR JL3.5 (CATHETERS) ×2 IMPLANT
CATH INFINITI 5FR AL1 (CATHETERS) ×2 IMPLANT
CATH INFINITI 5FR ANG PIGTAIL (CATHETERS) ×2 IMPLANT
CATH INFINITI JR4 5F (CATHETERS) ×2 IMPLANT
DEVICE RAD COMP TR BAND LRG (VASCULAR PRODUCTS) ×2 IMPLANT
GLIDESHEATH SLEND SS 6F .021 (SHEATH) ×2 IMPLANT
KIT HEART LEFT (KITS) ×2 IMPLANT
PACK CARDIAC CATHETERIZATION (CUSTOM PROCEDURE TRAY) ×2 IMPLANT
SYR MEDRAD MARK V 150ML (SYRINGE) ×2 IMPLANT
TRANSDUCER W/STOPCOCK (MISCELLANEOUS) ×2 IMPLANT
TUBING CIL FLEX 10 FLL-RA (TUBING) ×2 IMPLANT
WIRE HI TORQ VERSACORE-J 145CM (WIRE) ×2 IMPLANT
WIRE SAFE-T 1.5MM-J .035X260CM (WIRE) ×2 IMPLANT

## 2015-06-10 NOTE — Discharge Instructions (Signed)
Hold metformin x 48 hours.  Radial Site Care Refer to this sheet in the next few weeks. These instructions provide you with information about caring for yourself after your procedure. Your health care provider may also give you more specific instructions. Your treatment has been planned according to current medical practices, but problems sometimes occur. Call your health care provider if you have any problems or questions after your procedure. WHAT TO EXPECT AFTER THE PROCEDURE After your procedure, it is typical to have the following:  Bruising at the radial site that usually fades within 1-2 weeks.  Blood collecting in the tissue (hematoma) that may be painful to the touch. It should usually decrease in size and tenderness within 1-2 weeks. HOME CARE INSTRUCTIONS  Take medicines only as directed by your health care provider.  You may shower 24-48 hours after the procedure or as directed by your health care provider. Remove the bandage (dressing) and gently wash the site with plain soap and water. Pat the area dry with a clean towel. Do not rub the site, because this may cause bleeding.  Do not take baths, swim, or use a hot tub until your health care provider approves.  Check your insertion site every day for redness, swelling, or drainage.  Do not apply powder or lotion to the site.  Do not flex or bend the affected arm for 24 hours or as directed by your health care provider.  Do not push or pull heavy objects with the affected arm for 24 hours or as directed by your health care provider.  Do not lift over 10 lb (4.5 kg) for 5 days after your procedure or as directed by your health care provider.  Ask your health care provider when it is okay to:  Return to work or school.  Resume usual physical activities or sports.  Resume sexual activity.  Do not drive home if you are discharged the same day as the procedure. Have someone else drive you.  You may drive 24 hours after the  procedure unless otherwise instructed by your health care provider.  Do not operate machinery or power tools for 24 hours after the procedure.  If your procedure was done as an outpatient procedure, which means that you went home the same day as your procedure, a responsible adult should be with you for the first 24 hours after you arrive home.  Keep all follow-up visits as directed by your health care provider. This is important. SEEK MEDICAL CARE IF:  You have a fever.  You have chills.  You have increased bleeding from the radial site. Hold pressure on the site. SEEK IMMEDIATE MEDICAL CARE IF:  You have unusual pain at the radial site.  You have redness, warmth, or swelling at the radial site.  You have drainage (other than a small amount of blood on the dressing) from the radial site.  The radial site is bleeding, and the bleeding does not stop after 30 minutes of holding steady pressure on the site.  Your arm or hand becomes pale, cool, tingly, or numb.   This information is not intended to replace advice given to you by your health care provider. Make sure you discuss any questions you have with your health care provider.   Document Released: 06/04/2010 Document Revised: 05/23/2014 Document Reviewed: 11/18/2013 Elsevier Interactive Patient Education [employer].

## 2015-06-10 NOTE — Interval H&P Note (Signed)
History and Physical Interval Note:  06/10/2015 9:58 AM  Matthew Wilkins  has presented today for cardiac cath with the diagnosis of unstable angina. The various methods of treatment have been discussed with the patient and family. After consideration of risks, benefits and other options for treatment, the patient has consented to  Procedure(s): Left Heart Cath and Coronary Angiography (N/A) as a surgical intervention .  The patient's history has been reviewed, patient examined, no change in status, stable for surgery.  I have reviewed the patient's chart and labs.  Questions were answered to the patient's satisfaction.    Cath Lab Visit (complete for each Cath Lab visit)  Clinical Evaluation Leading to the Procedure:   ACS: No.  Non-ACS:    Anginal Classification: CCS III  Anti-ischemic medical therapy: No Therapy  Non-Invasive Test Results: No non-invasive testing performed  Prior CABG: No previous CABG         Pelagia Iacobucci

## 2015-06-10 NOTE — H&P (View-Only) (Signed)
Chief Complaint  Patient presents with  . Follow-up    CAD, PAD    History of Present Illness: 70 yo WM with history of DM, HTN, CAD, HLD, Parkinson's Disease, ongoing tobacco abuse and PAD who is here today for cardiac and PV follow up. I saw him in February 2013 as a new PV patient. His cardiac issues had been followed by Dr. Lia Foyer. His PV issues had been followed in the [facility] by Dr. Gwenlyn Found but he changed to our [facility] clinic in February 2013.  In April of 2012, he had ABI of 0.73 on the right and 0.84 on the left. Dr. Gwenlyn Found arranged a distal aortogram with bilateral LE runoff. The patient described life-style limiting claudication at that time. He was found to have a CTO of the left SFA with reconstituion by collaterals. He also had a severe 99% proximal right SFA stenosis treated with a self expanding stent. (7 x 40 Abbott Absolute Pro nitinol self-expanding stent. Postdilatation was performed with a 6 x 4 balloon at 2 atmospheres). Dr. Lia Foyer saw him in f/u in November 2012 and arranged repeat non-invasive imaging. Lower ext arterial dopplers on 05/30/11 with ABI of 0.63 on the right and 0.64 on the left. There were elevated velocities in the R SFA suggesting progression of disease in the area of the stent.  I arranged a distal aortogram with bilateral lower extremity runoff on 07/13/11. His left SFA CTO was unchanged with good collaterals. His proximal right SFA stent had a 99% stenosis in the distal portion of the stented segment. I performed a balloon angioplasty and had an unfavorable result so a 6.0 x 40 mm SMART Cordis self expanding stent was placed in an overlapping fashion with the previously placed stent. I then post-dilated with a 5.0 x 40 mm balloon and then hit the overlap with this balloon. There was an excellent result. The stenosis was taken from 95% within the stent to 0% and the stenosis beyond the stent was taken from 80% down to 0%. Last ABI 04/30/13  right 0.68, left 0.80.  Last carotids 02/06/13 with 40-59% RICA stenosis, 5-17% LICA stenosis. Cardiac history includes cath in April 2010 with 70% ostial LAD (FFR 0.94), 60% OM, 40% mid RCA. Dr. Lia Foyer has managed medically since then. LVEF normal during that cath. I saw him in March 2015 and he c/o chest pains and dyspnea at rest and with exertion. I arranged a cardiac cath at [facility]. He arrived for the cath but became angry when he was asked by nursing staff to remove his pants and he then left before he could have his cath. I saw him in the office December 2016 and he was doing well except for numbness in both feet. ABI were arranged but he has not yet completed this testing. He was seen in the [facility] ED 05/30/15 with chest pain. Troponin was negative. EKG did not show ischemic changes. He left from the ED against medical advice after admission was recommended.   He is here today for follow up. He continues to smoke. He has no desire to stop. He has been having numbness in both feet with tingling but no pain in his calves when walking. He has no ulcerations.  He is refusing to have the LE dopplers as ordered at the last visit. He continues to have daily exertional chest pain.   Primary Care Physician: Kathryne Eriksson   Past Medical History  Diagnosis Date  . Diabetes  mellitus   . Hypertension   . Coronary artery disease   . Hypercholesterolemia   . SOB (shortness of breath)   . Anxiety   . Arthritis   . PAD (peripheral artery disease) [facility])     Past Surgical History  Procedure Laterality Date  . Lower extremity angiogram N/A 07/13/2011    Procedure: LOWER EXTREMITY ANGIOGRAM;  Surgeon: Burnell Blanks, MD;  Location: [facility] CATH LAB;  Service: Cardiovascular;  Laterality: N/A;  . Percutaneous stent intervention Right 07/13/2011    Procedure: PERCUTANEOUS STENT INTERVENTION;  Surgeon: Burnell Blanks, MD;  Location: [facility] CATH LAB;  Service: Cardiovascular;  Laterality: Right;     Current Outpatient Prescriptions  Medication Sig Dispense Refill  . clopidogrel (PLAVIX) 75 MG tablet Take 1 tablet (75 mg total) by mouth daily. 30 tablet 11  . glipiZIDE (GLUCOTROL XL) 5 MG 24 hr tablet Take 5 mg by mouth daily.    [name] levothyroxine (SYNTHROID) 25 MCG tablet Take 1 tablet (25 mcg total) by mouth daily before breakfast. 30 tablet 11  . lisinopril (PRINIVIL,ZESTRIL) 5 MG tablet Take 5 mg by mouth daily.    . metFORMIN (GLUCOPHAGE-XR) 500 MG 24 hr tablet Take 500 mg by mouth 2 (two) times daily.    . pantoprazole (PROTONIX) 40 MG tablet Take 40 mg by mouth daily.    [name] rOPINIRole (REQUIP) 3 MG tablet Take 3 mg by mouth 4 (four) times daily as needed (Restless Leg).     . rosuvastatin (CRESTOR) 20 MG tablet Take 1 tablet (20 mg total) by mouth daily. 30 tablet 11  . tiotropium (SPIRIVA) 18 MCG inhalation capsule Place 18 mcg into inhaler and inhale daily as needed (Shortness Of Breath).    [name] ZETIA 10 MG tablet Take 10 mg by mouth daily.     No current facility-administered medications for this visit.    Allergies  Allergen Reactions  . Aspirin Other (See Comments)    Nose bleeds     Social History   Social History  . Marital Status: Married    Spouse Name: N/A  . Number of Children: N/A  . Years of Education: N/A   Occupational History  . Not on file.   Social History Main Topics  . Smoking status: Current Every Day Smoker -- 1.00 packs/day    Types: Cigarettes  . Smokeless tobacco: Not on file  . Alcohol Use: No  . Drug Use: No  . Sexual Activity: Not on file   Other Topics Concern  . Not on file   Social History Narrative    Family History  Problem Relation Age of Onset  . Restless legs syndrome Brother     Review of Systems:  As stated in the HPI and otherwise negative.   BP 126/60 mmHg  Pulse 84  Ht '5\' 9"'$  (1.753 m)  Wt 165 lb (74.844 kg)  BMI 24.36 kg/m2  SpO2 98%  Physical Examination: General: Well developed, well nourished,  NAD HEENT: OP clear, mucus membranes moist SKIN: warm, dry. No rashes. Neuro: No focal deficits Musculoskeletal: Muscle strength 5/5 all ext Psychiatric: Mood and affect normal Neck: No JVD, no carotid bruits, no thyromegaly, no lymphadenopathy. Lungs:Clear bilaterally, poor air movement. No wheezes, rhonci, crackles Cardiovascular: Regular rate and rhythm. No murmurs, gallops or rubs. Abdomen:Soft. Bowel sounds present. Non-tender.  Extremities: No lower extremity edema. Pulses are trace in the bilateral DP/PT.  Carotid artery dopplers:02/06/13:  40-59% RICA stenosis, 7-02% LICA stenosis. Patent vertebral arteries  Cardiac cath 09/04/08:  On plain fluoroscopy, there was fairly extensive calcification of  the coronary arteries.  2. The left main is free of critical disease. It trifurcates into a  small intermediate and larger circumflex and LAD.  3. The LAD courses to the apex. There are 2 moderate diagonal  branches and several septal perforators. The ostium of the LAD has  an eccentric wedge-like defect in the proximal vessel resulting in  reduction in lumen diameter, probably 60-70%. It potentially could  be tighter than this or less tight, and multiple views were done to  try to lay this out best. The remainder LAD coursed to the apex  and had luminal irregularities but no critical stenoses.  4. The circumflex provides a moderate-sized first marginal, and then a  smaller second and third marginals. Leading into the 2 smaller  marginals and into the larger marginal, there is a diffuse plaque  of about 40-50%. It does not appear to be high grade or flow  limiting. The distal vessel is of moderate size, and a marginal is  not critically diseased. The other 2 vessels are relatively small  in caliber.  5. The right coronary artery is a moderate-sized vessel with moderate  calcification. There is an eccentric plaque in the mid vessel of  about 40%, posterolateral branch has 20-30%  narrowing. There is  mild luminal irregularity elsewhere.  6. Ventriculography in the RAO projection reveals ejection fraction in  excess of 55%. There is 1+ mitral regurgitation.  EKG:  EKG is not ordered today. The ekg ordered today demonstrates   Recent Labs: 05/30/2015: ALT 19; BUN 20; Creatinine, Ser 0.95; Hemoglobin 17.6*; Platelets 201; Potassium 3.6; Sodium 136     Wt Readings from Last 3 Encounters:  06/04/15 165 lb (74.844 kg)  05/30/15 160 lb (72.576 kg)  04/27/15 165 lb (74.844 kg)     Other studies Reviewed: Additional studies/ records that were reviewed today include:  Review of the above records demonstrates:    Assessment and Plan:   1. PAD: He is on Plavix. Known to have a CTO of the left SFA with good collaterals and prior stenting of the right SFA. He is refusing to have LE dopplers.    2. Tobacco Abuse:  Smoking cessation advised. He does not wish to stop.   3. CAD:  Recent episodes of chest pain concerning for unstable angina. He has resumed his Plavix and statin. As above, cardiac cath had been planned last year but he left the hospital in a rage before his cath. He is now agreeable to a cardiac cath. Will plan next week at [facility]. Risks and benefits reviewed with pt.   4. Carotid artery disease: Last dopplers 7/13 with 40-59% RICA stenosis and 0-94% LICA stenosis. He needs repeat dopplers. He has not agreed to repeat testing.   Current medicines are reviewed at length with the patient today.  The patient does not have concerns regarding medicines.  The following changes have been made:  no change  Labs/ tests ordered today include:   No orders of the defined types were placed in this encounter.    Disposition:   FU with me after his cath.   Signed, Lauree Chandler, MD 06/04/2015 2:43 PM    [address], [place], [place]  [address] Phone: [phone]; Fax: [phone]

## 2015-06-11 ENCOUNTER — Encounter (HOSPITAL_COMMUNITY): Payer: Self-pay | Admitting: Cardiovascular Disease

## 2015-06-23 DIAGNOSIS — E1165 Type 2 diabetes mellitus with hyperglycemia: Secondary | ICD-10-CM | POA: Diagnosis not present

## 2015-06-23 DIAGNOSIS — G2581 Restless legs syndrome: Secondary | ICD-10-CM | POA: Diagnosis not present

## 2015-06-23 DIAGNOSIS — J449 Chronic obstructive pulmonary disease, unspecified: Secondary | ICD-10-CM | POA: Diagnosis not present

## 2015-06-23 DIAGNOSIS — Z8679 Personal history of other diseases of the circulatory system: Secondary | ICD-10-CM | POA: Diagnosis not present

## 2015-06-23 DIAGNOSIS — E782 Mixed hyperlipidemia: Secondary | ICD-10-CM | POA: Diagnosis not present

## 2015-06-23 DIAGNOSIS — E1142 Type 2 diabetes mellitus with diabetic polyneuropathy: Secondary | ICD-10-CM | POA: Diagnosis not present

## 2015-06-23 DIAGNOSIS — E039 Hypothyroidism, unspecified: Secondary | ICD-10-CM | POA: Diagnosis not present

## 2015-06-23 DIAGNOSIS — I10 Essential (primary) hypertension: Secondary | ICD-10-CM | POA: Diagnosis not present

## 2015-06-23 DIAGNOSIS — R69 Illness, unspecified: Secondary | ICD-10-CM | POA: Diagnosis not present

## 2015-06-23 DIAGNOSIS — K219 Gastro-esophageal reflux disease without esophagitis: Secondary | ICD-10-CM | POA: Diagnosis not present

## 2015-06-29 ENCOUNTER — Ambulatory Visit: Payer: Medicare HMO | Admitting: Cardiovascular Disease

## 2016-03-18 DIAGNOSIS — R531 Weakness: Secondary | ICD-10-CM | POA: Diagnosis not present

## 2016-03-18 DIAGNOSIS — R69 Illness, unspecified: Secondary | ICD-10-CM | POA: Diagnosis not present

## 2016-03-18 DIAGNOSIS — Z23 Encounter for immunization: Secondary | ICD-10-CM | POA: Diagnosis not present

## 2016-04-19 DIAGNOSIS — Z8679 Personal history of other diseases of the circulatory system: Secondary | ICD-10-CM | POA: Diagnosis not present

## 2016-04-19 DIAGNOSIS — K219 Gastro-esophageal reflux disease without esophagitis: Secondary | ICD-10-CM | POA: Diagnosis not present

## 2016-04-19 DIAGNOSIS — R69 Illness, unspecified: Secondary | ICD-10-CM | POA: Diagnosis not present

## 2016-04-19 DIAGNOSIS — I10 Essential (primary) hypertension: Secondary | ICD-10-CM | POA: Diagnosis not present

## 2016-04-19 DIAGNOSIS — J449 Chronic obstructive pulmonary disease, unspecified: Secondary | ICD-10-CM | POA: Diagnosis not present

## 2016-04-19 DIAGNOSIS — G2581 Restless legs syndrome: Secondary | ICD-10-CM | POA: Diagnosis not present

## 2016-04-19 DIAGNOSIS — E038 Other specified hypothyroidism: Secondary | ICD-10-CM | POA: Diagnosis not present

## 2016-04-19 DIAGNOSIS — E782 Mixed hyperlipidemia: Secondary | ICD-10-CM | POA: Diagnosis not present

## 2016-04-19 DIAGNOSIS — E1165 Type 2 diabetes mellitus with hyperglycemia: Secondary | ICD-10-CM | POA: Diagnosis not present

## 2016-04-19 DIAGNOSIS — Z23 Encounter for immunization: Secondary | ICD-10-CM | POA: Diagnosis not present

## 2016-04-19 DIAGNOSIS — E1142 Type 2 diabetes mellitus with diabetic polyneuropathy: Secondary | ICD-10-CM | POA: Diagnosis not present

## 2016-08-26 ENCOUNTER — Encounter (HOSPITAL_COMMUNITY): Payer: Self-pay | Admitting: Emergency Medicine

## 2016-08-26 ENCOUNTER — Emergency Department (HOSPITAL_COMMUNITY)
Admission: EM | Admit: 2016-08-26 | Discharge: 2016-08-27 | Disposition: A | Discharge status: Home / Self Care | Payer: Medicare HMO | Source: Home / Self Care | Attending: Emergency Medicine | Admitting: Emergency Medicine

## 2016-08-26 ENCOUNTER — Emergency Department (HOSPITAL_COMMUNITY): Payer: Medicare HMO

## 2016-08-26 DIAGNOSIS — I10 Essential (primary) hypertension: Secondary | ICD-10-CM

## 2016-08-26 DIAGNOSIS — J449 Chronic obstructive pulmonary disease, unspecified: Secondary | ICD-10-CM

## 2016-08-26 DIAGNOSIS — Z7984 Long term (current) use of oral hypoglycemic drugs: Secondary | ICD-10-CM | POA: Insufficient documentation

## 2016-08-26 DIAGNOSIS — I2510 Atherosclerotic heart disease of native coronary artery without angina pectoris: Secondary | ICD-10-CM

## 2016-08-26 DIAGNOSIS — F329 Major depressive disorder, single episode, unspecified: Secondary | ICD-10-CM

## 2016-08-26 DIAGNOSIS — R69 Illness, unspecified: Secondary | ICD-10-CM | POA: Diagnosis not present

## 2016-08-26 DIAGNOSIS — F32A Depression, unspecified: Secondary | ICD-10-CM

## 2016-08-26 DIAGNOSIS — R5381 Other malaise: Secondary | ICD-10-CM

## 2016-08-26 DIAGNOSIS — Z8673 Personal history of transient ischemic attack (TIA), and cerebral infarction without residual deficits: Secondary | ICD-10-CM

## 2016-08-26 DIAGNOSIS — R51 Headache: Secondary | ICD-10-CM | POA: Diagnosis not present

## 2016-08-26 DIAGNOSIS — R0600 Dyspnea, unspecified: Secondary | ICD-10-CM | POA: Diagnosis not present

## 2016-08-26 DIAGNOSIS — F17210 Nicotine dependence, cigarettes, uncomplicated: Secondary | ICD-10-CM

## 2016-08-26 DIAGNOSIS — E119 Type 2 diabetes mellitus without complications: Secondary | ICD-10-CM | POA: Insufficient documentation

## 2016-08-26 DIAGNOSIS — I2609 Other pulmonary embolism with acute cor pulmonale: Secondary | ICD-10-CM | POA: Diagnosis not present

## 2016-08-26 LAB — CBC
HCT: 43.8 % (ref 39.0–52.0)
Hemoglobin: 15.6 g/dL (ref 13.0–17.0)
MCH: 30.4 pg (ref 26.0–34.0)
MCHC: 35.6 g/dL (ref 30.0–36.0)
MCV: 85.4 fL (ref 78.0–100.0)
Platelets: 130 10*3/uL — ABNORMAL LOW (ref 150–400)
RBC: 5.13 MIL/uL (ref 4.22–5.81)
RDW: 13.3 % (ref 11.5–15.5)
WBC: 5.2 10*3/uL (ref 4.0–10.5)

## 2016-08-26 LAB — URINALYSIS, ROUTINE W REFLEX MICROSCOPIC
Bilirubin Urine: NEGATIVE
Glucose, UA: >=500 mg/dL — AB
Ketones, ur: 5 mg/dL — AB
Leukocytes, UA: NEGATIVE
Nitrite: NEGATIVE
Protein, ur: 30 mg/dL — AB
Specific Gravity, Urine: 1.028 (ref 1.005–1.030)
pH: 5 (ref 5.0–8.0)

## 2016-08-26 LAB — BASIC METABOLIC PANEL
Anion gap: 14 (ref 5–15)
BUN: 14 mg/dL (ref 6–20)
CO2: 23 mmol/L (ref 22–32)
Calcium: 9.5 mg/dL (ref 8.9–10.3)
Chloride: 95 mmol/L — ABNORMAL LOW (ref 101–111)
Creatinine, Ser: 1.26 mg/dL — ABNORMAL HIGH (ref 0.61–1.24)
GFR calc Af Amer: >60 mL/min (ref >60)
GFR calc non Af Amer: 55 mL/min — ABNORMAL LOW (ref >60)
Glucose, Bld: 214 mg/dL — ABNORMAL HIGH (ref 65–99)
Potassium: 4.1 mmol/L (ref 3.5–5.1)
Sodium: 132 mmol/L — ABNORMAL LOW (ref 135–145)

## 2016-08-26 NOTE — ED Notes (Signed)
Patient wishes to not wear gown.

## 2016-08-26 NOTE — ED Triage Notes (Signed)
Pt presents to ED for assessment of multiple complaints including: mid lower back pain x 1 week, right flank pain x 1 week, increased urination, shortness of breath with 1.5 packs/per day smoker, headaches, and generalized malaise/fatigue.  C/o some lower abdominal pain.  Also states "my eyeballs hurt".

## 2016-08-26 NOTE — ED Provider Notes (Signed)
[facility] DEPT Provider Note    By signing my name below, I, Bea Graff, attest that this documentation has been prepared under the direction and in the presence of Virgel Manifold, MD. Electronically Signed: Bea Graff, ED Scribe. 08/26/16. 10:38 PM.   History   Chief Complaint Chief Complaint  Patient presents with  . Back Pain  . Flank Pain    The history is provided by the patient and medical records. No language interpreter was used.    Matthew Wilkins is a 71 y.o. male with PMHx of anxiety, CAD, DM, HLD, HTN, PAD, PVD, TIA, COPD and Parkinson's disease who presents to the Emergency Department complaining of abdominal pain that began three days ago. He reports associated back and rib pain. He states he has been experiencing some dizziness and feels off balance. He states he sees blinking lights with the right eye. Pt states he has lost about 60 pounds in the past 6 months since his wife died and states he has had a decreased appetite. Daughter states he has SOB with exertion that is progressively worsening. He has not taken anything for his symptoms. He denies modifying factors. He denies difficulty urinating, dysuria, nausea, vomiting, CP. He smokes 1.5 PPD. He denies any recent falls. Pt lives alone.   Past Medical History:  Diagnosis Date  . Anxiety   . Arthritis   . Coronary artery disease   . Diabetes mellitus   . Hypercholesterolemia   . Hypertension   . PAD (peripheral artery disease) ([place])   . SOB (shortness of breath)     Patient Active Problem List   Diagnosis Date Noted  . Coronary artery disease involving native coronary artery of native heart with unstable angina pectoris ([place])   . TIA (transient ischemic attack) 11/27/2011  . Research study patient 08/30/2011  . PAD (peripheral artery disease) ([place]) 06/30/2011  . PVD (peripheral vascular disease) ([place]) 01/10/2011  . COPD 07/06/2010  . TOBACCO ABUSE 11/12/2008  . CAD 09/08/2008  . DM  08/26/2008  . HYPERCHOLESTEROLEMIA  IIA 08/26/2008  . ANXIETY 08/26/2008  . PARKINSON'S DISEASE 08/26/2008  . HYPERTENSION, UNSPECIFIED 08/26/2008  . ARTHRITIS 08/26/2008  . SHORTNESS OF BREATH 08/26/2008    Past Surgical History:  Procedure Laterality Date  . CARDIAC CATHETERIZATION N/A 06/10/2015   Procedure: Left Heart Cath and Coronary Angiography;  Surgeon: Burnell Blanks, MD;  Location: [facility] CV LAB;  Service: Cardiovascular;  Laterality: N/A;  . LOWER EXTREMITY ANGIOGRAM N/A 07/13/2011   Procedure: LOWER EXTREMITY ANGIOGRAM;  Surgeon: Burnell Blanks, MD;  Location: [facility] CATH LAB;  Service: Cardiovascular;  Laterality: N/A;  . PERCUTANEOUS STENT INTERVENTION Right 07/13/2011   Procedure: PERCUTANEOUS STENT INTERVENTION;  Surgeon: Burnell Blanks, MD;  Location: [facility] CATH LAB;  Service: Cardiovascular;  Laterality: Right;       Home Medications    Prior to Admission medications   Medication Sig Start Date End Date Taking? Authorizing Provider  clopidogrel (PLAVIX) 75 MG tablet Take 1 tablet (75 mg total) by mouth daily. 10/07/14   Burnell Blanks, MD  glipiZIDE (GLUCOTROL XL) 5 MG 24 hr tablet Take 5 mg by mouth daily. 03/11/15   Historical Provider, MD  levothyroxine (SYNTHROID) 25 MCG tablet Take 1 tablet (25 mcg total) by mouth daily before breakfast. 10/07/14   Burnell Blanks, MD  lisinopril (PRINIVIL,ZESTRIL) 5 MG tablet Take 5 mg by mouth daily. 05/21/15   Historical Provider, MD  pantoprazole (PROTONIX) 40 MG tablet Take 40 mg by  mouth daily. 04/22/15   Historical Provider, MD  rOPINIRole (REQUIP) 3 MG tablet Take 3 mg by mouth 4 (four) times daily as needed (Restless Leg).  04/10/15   Historical Provider, MD  rosuvastatin (CRESTOR) 20 MG tablet Take 1 tablet (20 mg total) by mouth daily. 10/07/14   Burnell Blanks, MD  tiotropium (SPIRIVA) 18 MCG inhalation capsule Place 18 mcg into inhaler and inhale daily as needed (Shortness Of  Breath).    Historical Provider, MD  ZETIA 10 MG tablet Take 10 mg by mouth daily. 04/03/15   Historical Provider, MD    Family History Family History  Problem Relation Age of Onset  . Restless legs syndrome Brother     Social History Social History  Substance Use Topics  . Smoking status: Current Every Day Smoker    Packs/day: 1.50    Types: Cigarettes  . Smokeless tobacco: Never Used  . Alcohol use No     Allergies   Aspirin   Review of Systems Review of Systems All other systems reviewed and are negative for acute change except as noted in the HPI.   Physical Exam Updated Vital Signs BP (!) 145/68 (BP Location: Left Arm)   Pulse (!) 103   Temp 97.9 F (36.6 C) (Oral)   Resp 18   Ht '5\' 9"'$  (1.753 m)   Wt 145 lb (65.8 kg)   SpO2 97%   BMI 21.41 kg/m   Physical Exam  Constitutional: He is oriented to person, place, and time. He appears well-developed and well-nourished. He appears cachectic.  Frail. Generally very weak.  HENT:  Head: Normocephalic.  Eyes: EOM are normal.  Neck: Normal range of motion.  Cardiovascular: Normal rate, regular rhythm and normal heart sounds.  Exam reveals no gallop and no friction rub.   No murmur heard. Pulmonary/Chest: Effort normal and breath sounds normal. No respiratory distress. He has no wheezes. He has no rales.  Out of breath just sitting up in bed.  Abdominal: Soft. He exhibits no distension.  Musculoskeletal: Normal range of motion.  Neurological: He is alert and oriented to person, place, and time.  Cannot stand unassisted. Good finger to nose bilaterally.  Psychiatric: He has a normal mood and affect.  Nursing note and vitals reviewed.    ED Treatments / Results  DIAGNOSTIC STUDIES: Oxygen Saturation is 97% on RA, normal by my interpretation.   COORDINATION OF CARE: 10:36 PM- Will review labs and order CXR. Pt verbalizes understanding and agrees to plan.  Medications - No data to display   Labs (all  labs ordered are listed, but only abnormal results are displayed) Labs Reviewed  URINALYSIS, ROUTINE W REFLEX MICROSCOPIC - Abnormal; Notable for the following:       Result Value   Color, Urine AMBER (*)    APPearance HAZY (*)    Glucose, UA >=500 (*)    Hgb urine dipstick SMALL (*)    Ketones, ur 5 (*)    Protein, ur 30 (*)    Bacteria, UA RARE (*)    Squamous Epithelial / LPF 0-5 (*)    All other components within normal limits  CBC - Abnormal; Notable for the following:    Platelets 130 (*)    All other components within normal limits  BASIC METABOLIC PANEL - Abnormal; Notable for the following:    Sodium 132 (*)    Chloride 95 (*)    Glucose, Bld 214 (*)    Creatinine, Ser 1.26 (*)  GFR calc non Af Amer 55 (*)    All other components within normal limits    EKG  EKG Interpretation  Date/Time:  Friday August 26 2016 22:52:19 EDT Ventricular Rate:  87 PR Interval:    QRS Duration: 102 QT Interval:  366 QTC Calculation: 441 R Axis:   68 Text Interpretation:  Sinus rhythm Nonspecific T abnormalities, lateral leads Baseline wander When compared with ECG of 09/07/2010 Nonspecific T wave abnormality is now Present Confirmed by [facility]  MD, Nunzio Cory [phone]) on 08/27/2016 9:06:49 PM       Radiology No results found.   Ct Head Wo Contrast  Result Date: 08/28/2016 CLINICAL DATA:  Weakness. Weight loss. Abdominal pain. Headache and blurred vision. EXAM: CT HEAD WITHOUT CONTRAST TECHNIQUE: Contiguous axial images were obtained from the base of the skull through the vertex without intravenous contrast. COMPARISON:  01/28/2010 FINDINGS: Brain: No mass lesion, hemorrhage, hydrocephalus, acute infarct, intra-axial, or extra-axial fluid collection. Vascular: No hyperdense vessel or unexpected calcification. Skull: Normal Sinuses/Orbits: Normal imaged portions of the orbits and globes. Minimal mucosal thickening of left maxillary sinus. Clear mastoid air cells. Other: None. IMPRESSION:  No acute intracranial abnormality. Electronically Signed   By: Abigail Miyamoto M.D.   On: 08/28/2016 19:10   Procedures Procedures (including critical care time)  Medications Ordered in ED Medications - No data to display   Initial Impression / Assessment and Plan / ED Course  I have reviewed the triage vital signs and the nursing notes.  Pertinent labs & imaging results that were available during my care of the patient were reviewed by me and considered in my medical decision making (see chart for details).     71yM with numerous complaints. I'm not sure how much of his dyspnea is related to possible deconditioning versus more acute process. He appears comfortable at rest. Long smoking hx. Mild wheezing on exam, but air movement actually seems pretty good. CXR w/o acute process. Little in terms of risk factors for PE. Doesn't appear volume overloaded. I doubt ACS.  I think this is primarily depression/grieving and physical deconditioning. Pt wants to go home. I don't feel great about discharging a man that can barely stand, but he is of sound mind and is adamant about going home. I think he would benefit from physical therapy. Advised to discuss with his PCP as soon as he can  Final Clinical Impressions(s) / ED Diagnoses   Final diagnoses:  Physical deconditioning  Depression, unspecified depression type    New Prescriptions New Prescriptions   No medications on file  I personally preformed the services scribed in my presence. The recorded information has been reviewed is accurate. Virgel Manifold, MD.    Virgel Manifold, MD 09/05/16 [phone]

## 2016-08-27 NOTE — ED Notes (Signed)
E-signature not available, pt verbalized understanding of DC instructions  

## 2016-08-28 ENCOUNTER — Inpatient Hospital Stay (HOSPITAL_COMMUNITY)
Admission: EM | Admit: 2016-08-28 | Discharge: 2016-08-31 | DRG: 175 | Disposition: A | Discharge status: Home / Self Care | Payer: Medicare HMO | Attending: Internal Medicine | Admitting: Internal Medicine

## 2016-08-28 ENCOUNTER — Emergency Department (HOSPITAL_COMMUNITY): Payer: Medicare HMO

## 2016-08-28 ENCOUNTER — Encounter (HOSPITAL_COMMUNITY): Payer: Self-pay | Admitting: Emergency Medicine

## 2016-08-28 DIAGNOSIS — I739 Peripheral vascular disease, unspecified: Secondary | ICD-10-CM | POA: Diagnosis not present

## 2016-08-28 DIAGNOSIS — E860 Dehydration: Secondary | ICD-10-CM | POA: Diagnosis present

## 2016-08-28 DIAGNOSIS — I2510 Atherosclerotic heart disease of native coronary artery without angina pectoris: Secondary | ICD-10-CM | POA: Diagnosis present

## 2016-08-28 DIAGNOSIS — I82402 Acute embolism and thrombosis of unspecified deep veins of left lower extremity: Secondary | ICD-10-CM | POA: Diagnosis present

## 2016-08-28 DIAGNOSIS — Z7902 Long term (current) use of antithrombotics/antiplatelets: Secondary | ICD-10-CM | POA: Diagnosis not present

## 2016-08-28 DIAGNOSIS — N400 Enlarged prostate without lower urinary tract symptoms: Secondary | ICD-10-CM | POA: Diagnosis present

## 2016-08-28 DIAGNOSIS — Z7901 Long term (current) use of anticoagulants: Secondary | ICD-10-CM | POA: Diagnosis not present

## 2016-08-28 DIAGNOSIS — H539 Unspecified visual disturbance: Secondary | ICD-10-CM | POA: Diagnosis present

## 2016-08-28 DIAGNOSIS — R911 Solitary pulmonary nodule: Secondary | ICD-10-CM | POA: Diagnosis not present

## 2016-08-28 DIAGNOSIS — N132 Hydronephrosis with renal and ureteral calculous obstruction: Secondary | ICD-10-CM | POA: Diagnosis not present

## 2016-08-28 DIAGNOSIS — I2699 Other pulmonary embolism without acute cor pulmonale: Secondary | ICD-10-CM | POA: Diagnosis not present

## 2016-08-28 DIAGNOSIS — Z86711 Personal history of pulmonary embolism: Secondary | ICD-10-CM | POA: Diagnosis present

## 2016-08-28 DIAGNOSIS — E1151 Type 2 diabetes mellitus with diabetic peripheral angiopathy without gangrene: Secondary | ICD-10-CM | POA: Diagnosis present

## 2016-08-28 DIAGNOSIS — H538 Other visual disturbances: Secondary | ICD-10-CM | POA: Diagnosis present

## 2016-08-28 DIAGNOSIS — J449 Chronic obstructive pulmonary disease, unspecified: Secondary | ICD-10-CM | POA: Diagnosis present

## 2016-08-28 DIAGNOSIS — E440 Moderate protein-calorie malnutrition: Secondary | ICD-10-CM | POA: Diagnosis present

## 2016-08-28 DIAGNOSIS — R161 Splenomegaly, not elsewhere classified: Secondary | ICD-10-CM | POA: Diagnosis present

## 2016-08-28 DIAGNOSIS — Z886 Allergy status to analgesic agent status: Secondary | ICD-10-CM | POA: Diagnosis not present

## 2016-08-28 DIAGNOSIS — I2609 Other pulmonary embolism with acute cor pulmonale: Principal | ICD-10-CM | POA: Diagnosis present

## 2016-08-28 DIAGNOSIS — Z8673 Personal history of transient ischemic attack (TIA), and cerebral infarction without residual deficits: Secondary | ICD-10-CM | POA: Diagnosis not present

## 2016-08-28 DIAGNOSIS — Z7984 Long term (current) use of oral hypoglycemic drugs: Secondary | ICD-10-CM | POA: Diagnosis not present

## 2016-08-28 DIAGNOSIS — D696 Thrombocytopenia, unspecified: Secondary | ICD-10-CM | POA: Diagnosis not present

## 2016-08-28 DIAGNOSIS — M1990 Unspecified osteoarthritis, unspecified site: Secondary | ICD-10-CM | POA: Diagnosis present

## 2016-08-28 DIAGNOSIS — N200 Calculus of kidney: Secondary | ICD-10-CM | POA: Diagnosis not present

## 2016-08-28 DIAGNOSIS — R51 Headache: Secondary | ICD-10-CM | POA: Diagnosis not present

## 2016-08-28 DIAGNOSIS — I10 Essential (primary) hypertension: Secondary | ICD-10-CM | POA: Diagnosis present

## 2016-08-28 DIAGNOSIS — I824Y2 Acute embolism and thrombosis of unspecified deep veins of left proximal lower extremity: Secondary | ICD-10-CM | POA: Diagnosis not present

## 2016-08-28 DIAGNOSIS — F17210 Nicotine dependence, cigarettes, uncomplicated: Secondary | ICD-10-CM | POA: Diagnosis present

## 2016-08-28 DIAGNOSIS — Z6820 Body mass index (BMI) 20.0-20.9, adult: Secondary | ICD-10-CM | POA: Diagnosis not present

## 2016-08-28 DIAGNOSIS — E7800 Pure hypercholesterolemia, unspecified: Secondary | ICD-10-CM | POA: Diagnosis present

## 2016-08-28 DIAGNOSIS — R079 Chest pain, unspecified: Secondary | ICD-10-CM | POA: Diagnosis present

## 2016-08-28 DIAGNOSIS — G20 Parkinson's disease: Secondary | ICD-10-CM | POA: Diagnosis present

## 2016-08-28 DIAGNOSIS — R109 Unspecified abdominal pain: Secondary | ICD-10-CM | POA: Diagnosis present

## 2016-08-28 DIAGNOSIS — N1330 Unspecified hydronephrosis: Secondary | ICD-10-CM | POA: Diagnosis present

## 2016-08-28 DIAGNOSIS — I82412 Acute embolism and thrombosis of left femoral vein: Secondary | ICD-10-CM | POA: Diagnosis not present

## 2016-08-28 LAB — URINALYSIS, ROUTINE W REFLEX MICROSCOPIC
Bilirubin Urine: NEGATIVE
Glucose, UA: >=500 mg/dL — AB
Hgb urine dipstick: NEGATIVE
KETONES UR: 20 mg/dL — AB
LEUKOCYTES UA: NEGATIVE
NITRITE: NEGATIVE
PROTEIN: NEGATIVE mg/dL
Specific Gravity, Urine: >1.046 — ABNORMAL HIGH (ref 1.005–1.030)
pH: 6 (ref 5.0–8.0)

## 2016-08-28 LAB — COMPREHENSIVE METABOLIC PANEL
ALT: 18 U/L (ref 17–63)
AST: 22 U/L (ref 15–41)
Albumin: 3.8 g/dL (ref 3.5–5.0)
Alkaline Phosphatase: 71 U/L (ref 38–126)
Anion gap: 12 (ref 5–15)
BUN: 21 mg/dL — ABNORMAL HIGH (ref 6–20)
CHLORIDE: 97 mmol/L — AB (ref 101–111)
CO2: 22 mmol/L (ref 22–32)
CREATININE: 0.99 mg/dL (ref 0.61–1.24)
Calcium: 9.1 mg/dL (ref 8.9–10.3)
GFR calc non Af Amer: >60 mL/min (ref >60)
Glucose, Bld: 221 mg/dL — ABNORMAL HIGH (ref 65–99)
POTASSIUM: 4 mmol/L (ref 3.5–5.1)
Sodium: 131 mmol/L — ABNORMAL LOW (ref 135–145)
Total Bilirubin: 1.1 mg/dL (ref 0.3–1.2)
Total Protein: 7 g/dL (ref 6.5–8.1)

## 2016-08-28 LAB — PROTIME-INR
INR: 1.32
PROTHROMBIN TIME: 16.5 s — AB (ref 11.4–15.2)

## 2016-08-28 LAB — CBC WITH DIFFERENTIAL/PLATELET
BASOS ABS: 0.1 10*3/uL (ref 0.0–0.1)
Basophils Relative: 1 %
Eosinophils Absolute: 0.1 10*3/uL (ref 0.0–0.7)
Eosinophils Relative: 2 %
HCT: 40 % (ref 39.0–52.0)
HEMOGLOBIN: 14.4 g/dL (ref 13.0–17.0)
LYMPHS ABS: 1.1 10*3/uL (ref 0.7–4.0)
Lymphocytes Relative: 23 %
MCH: 30.6 pg (ref 26.0–34.0)
MCHC: 36 g/dL (ref 30.0–36.0)
MCV: 84.9 fL (ref 78.0–100.0)
Monocytes Absolute: 0.6 10*3/uL (ref 0.1–1.0)
Monocytes Relative: 12 %
NEUTROS PCT: 62 %
Neutro Abs: 3 10*3/uL (ref 1.7–7.7)
Platelets: 121 10*3/uL — ABNORMAL LOW (ref 150–400)
RBC: 4.71 MIL/uL (ref 4.22–5.81)
RDW: 13.3 % (ref 11.5–15.5)
WBC: 4.7 10*3/uL (ref 4.0–10.5)

## 2016-08-28 LAB — LIPASE, BLOOD: LIPASE: 23 U/L (ref 11–51)

## 2016-08-28 LAB — TROPONIN I: Troponin I: <0.03 ng/mL (ref <0.03)

## 2016-08-28 MED ORDER — HEPARIN (PORCINE) IN NACL 100-0.45 UNIT/ML-% IJ SOLN
1200.0000 [IU]/h | INTRAMUSCULAR | Status: DC
  Administered 2016-08-28: 1000 [IU]/h via INTRAVENOUS
  Administered 2016-08-29 – 2016-08-30 (×2): 1200 [IU]/h via INTRAVENOUS
  Filled 2016-08-28 (×3): qty 250

## 2016-08-28 MED ORDER — ONDANSETRON HCL 4 MG PO TABS: 4.0000 mg | ORAL_TABLET | Freq: Four times a day (QID) | ORAL | Status: DC | PRN

## 2016-08-28 MED ORDER — ACETAMINOPHEN 325 MG PO TABS: 650.0000 mg | ORAL_TABLET | Freq: Four times a day (QID) | ORAL | Status: DC | PRN

## 2016-08-28 MED ORDER — SODIUM CHLORIDE 0.9 % IV SOLN: 250.0000 mL | INTRAVENOUS | Status: DC | PRN

## 2016-08-28 MED ORDER — HEPARIN BOLUS VIA INFUSION
4000.0000 [IU] | Freq: Once | INTRAVENOUS | Status: AC
  Administered 2016-08-28: 4000 [IU] via INTRAVENOUS

## 2016-08-28 MED ORDER — ACETAMINOPHEN 650 MG RE SUPP: 650.0000 mg | Freq: Four times a day (QID) | RECTAL | Status: DC | PRN

## 2016-08-28 MED ORDER — SODIUM CHLORIDE 0.9% FLUSH
3.0000 mL | Freq: Two times a day (BID) | INTRAVENOUS | Status: DC
  Administered 2016-08-29 – 2016-08-31 (×5): 3 mL via INTRAVENOUS

## 2016-08-28 MED ORDER — IOPAMIDOL (ISOVUE-370) INJECTION 76%
100.0000 mL | Freq: Once | INTRAVENOUS | Status: AC | PRN
  Administered 2016-08-28: 100 mL via INTRAVENOUS

## 2016-08-28 MED ORDER — ONDANSETRON HCL 4 MG/2ML IJ SOLN: 4.0000 mg | Freq: Four times a day (QID) | INTRAMUSCULAR | Status: DC | PRN

## 2016-08-28 MED ORDER — SODIUM CHLORIDE 0.9 % IV BOLUS (SEPSIS)
1000.0000 mL | Freq: Once | INTRAVENOUS | Status: AC
  Administered 2016-08-28: 1000 mL via INTRAVENOUS

## 2016-08-28 MED ORDER — ROPINIROLE HCL 1 MG PO TABS
3.0000 mg | ORAL_TABLET | Freq: Four times a day (QID) | ORAL | Status: DC | PRN
  Administered 2016-08-29: 3 mg via ORAL
  Filled 2016-08-28 (×2): qty 3

## 2016-08-28 MED ORDER — MORPHINE SULFATE (PF) 4 MG/ML IV SOLN
4.0000 mg | Freq: Once | INTRAVENOUS | Status: AC
  Administered 2016-08-28: 4 mg via INTRAVENOUS
  Filled 2016-08-28: qty 1

## 2016-08-28 MED ORDER — HYDROCODONE-ACETAMINOPHEN 5-325 MG PO TABS
1.0000 | ORAL_TABLET | ORAL | Status: DC | PRN
  Administered 2016-08-29 (×2): 2 via ORAL
  Filled 2016-08-28 (×2): qty 2

## 2016-08-28 MED ORDER — SODIUM CHLORIDE 0.9% FLUSH: 3.0000 mL | INTRAVENOUS | Status: DC | PRN

## 2016-08-28 MED ORDER — CLOPIDOGREL BISULFATE 75 MG PO TABS
75.0000 mg | ORAL_TABLET | Freq: Every day | ORAL | Status: DC
  Administered 2016-08-29: 75 mg via ORAL
  Filled 2016-08-28: qty 1

## 2016-08-28 MED ORDER — TIOTROPIUM BROMIDE MONOHYDRATE 18 MCG IN CAPS
18.0000 ug | ORAL_CAPSULE | Freq: Every day | RESPIRATORY_TRACT | Status: DC | PRN
  Filled 2016-08-28: qty 5

## 2016-08-28 MED ORDER — SODIUM CHLORIDE 0.9 % IV SOLN: INTRAVENOUS | Status: DC

## 2016-08-28 MED ORDER — INSULIN ASPART 100 UNIT/ML ~~LOC~~ SOLN: 0.0000 [IU] | Freq: Three times a day (TID) | SUBCUTANEOUS | Status: DC

## 2016-08-28 NOTE — ED Notes (Signed)
ED Provider at bedside. 

## 2016-08-28 NOTE — H&P (Signed)
History and Physical    Matthew Wilkins ZJI:[id number] DOB: 27-Nov-1945 DOA: 08/28/2016  PCP: Woody Seller, MD  Patient coming from:  home  Chief Complaint:   Chest pain and abdominal pain  HPI: Matthew Wilkins is a 71 y.o. male with medical history significant of CAD, DM, HTN, PVD comes in with several days of pain in the chest and right upper quadrant with associated fatigue and not eating well.  Pt is very poor historian.  He denies any leg swelling.  No coughing up blood.  No recent trauma or traveling.  Is sob.  Nothing makes it better or worse.  Pt found to have bilateral PE and referred for admission for treatment.   Review of Systems: As per HPI otherwise 10 point review of systems negative.   Past Medical History:  Diagnosis Date  . Anxiety   . Arthritis   . Coronary artery disease   . Diabetes mellitus   . Hypercholesterolemia   . Hypertension   . PAD (peripheral artery disease) ([place])   . SOB (shortness of breath)     Past Surgical History:  Procedure Laterality Date  . CARDIAC CATHETERIZATION N/A 06/10/2015   Procedure: Left Heart Cath and Coronary Angiography;  Surgeon: Burnell Blanks, MD;  Location: [place] [facility];  Service: Cardiovascular;  Laterality: N/A;  . LOWER EXTREMITY ANGIOGRAM N/A 07/13/2011   Procedure: LOWER EXTREMITY ANGIOGRAM;  Surgeon: Burnell Blanks, MD;  Location: [facility] CATH LAB;  Service: Cardiovascular;  Laterality: N/A;  . OTHER SURGICAL HISTORY    . OTHER SURGICAL HISTORY    . OTHER SURGICAL HISTORY    . PERCUTANEOUS STENT INTERVENTION Right 07/13/2011   Procedure: PERCUTANEOUS STENT INTERVENTION;  Surgeon: Burnell Blanks, MD;  Location: [facility] C[facility];  Service: Cardiovascular;  Laterality: Right;     reports that he has been smoking Cigarettes.  He has been smoking about 1.50 packs per day. He has never used smokeless tobacco. He reports that he does not drink alcohol or use drugs.  Allergies  Allergen Reactions  .  Aspirin Other (See Comments)    Nose bleeds     Family History  Problem Relation Age of Onset  . Restless legs syndrome Brother     Prior to Admission medications   Medication Sig Start Date End Date Taking? Authorizing Provider  clopidogrel (PLAVIX) 75 MG tablet Take 1 tablet (75 mg total) by mouth daily. 10/07/14   Burnell Blanks, MD  glipiZIDE (GLUCOTROL XL) 5 MG 24 hr tablet Take 5 mg by mouth daily. 03/11/15   Historical Provider, MD  levothyroxine (SYNTHROID) 25 MCG tablet Take 1 tablet (25 mcg total) by mouth daily before breakfast. 10/07/14   Burnell Blanks, MD  lisinopril (PRINIVIL,ZESTRIL) 5 MG tablet Take 5 mg by mouth daily. 05/21/15   Historical Provider, MD  pantoprazole (PROTONIX) 40 MG tablet Take 40 mg by mouth daily. 04/22/15   Historical Provider, MD  rOPINIRole (REQUIP) 3 MG tablet Take 3 mg by mouth 4 (four) times daily as needed (Restless Leg).  04/10/15   Historical Provider, MD  rosuvastatin (CRESTOR) 20 MG tablet Take 1 tablet (20 mg total) by mouth daily. 10/07/14   Burnell Blanks, MD  tiotropium (SPIRIVA) 18 MCG inhalation capsule Place 18 mcg into inhaler and inhale daily as needed (Shortness Of Breath).    Historical Provider, MD  ZETIA 10 MG tablet Take 10 mg by mouth daily. 04/03/15   Historical Provider, MD    Physical  Exam: Vitals:   08/28/16 1730 08/28/16 1745 08/28/16 1800 08/28/16 1930  BP: (!) 129/54  (!) 121/58 (!) 124/52  Pulse: 73 71 76 71  Resp: '13  17 15  '$ Temp:      TempSrc:      SpO2: 100% 95% 100% 100%  Weight:      Height:        Constitutional: NAD, calm, comfortable Vitals:   08/28/16 1730 08/28/16 1745 08/28/16 1800 08/28/16 1930  BP: (!) 129/54  (!) 121/58 (!) 124/52  Pulse: 73 71 76 71  Resp: '13  17 15  '$ Temp:      TempSrc:      SpO2: 100% 95% 100% 100%  Weight:      Height:       Eyes: PERRL, lids and conjunctivae normal ENMT: Mucous membranes are moist. Posterior pharynx clear of any exudate or  lesions.Normal dentition.  Neck: normal, supple, no masses, no thyromegaly Respiratory: clear to auscultation bilaterally, no wheezing, no crackles. Normal respiratory effort. No accessory muscle use.  Cardiovascular: Regular rate and rhythm, no murmurs / rubs / gallops. No extremity edema. 2+ pedal pulses. No carotid bruits.  Abdomen: no tenderness, no masses palpated. No hepatosplenomegaly. Bowel sounds positive.  Musculoskeletal: no clubbing / cyanosis. No joint deformity upper and lower extremities. Good ROM, no contractures. Normal muscle tone.  Skin: no rashes, lesions, ulcers. No induration Neurologic: CN 2-12 grossly intact. Sensation intact, DTR normal. Strength 5/5 in all 4.  Psychiatric: Normal judgment and insight. Alert and oriented x 3. Normal mood.    Labs on Admission: I have personally reviewed following labs and imaging studies  CBC:  Recent Labs Lab 08/26/16 1717 08/28/16 1658  WBC 5.2 4.7  NEUTROABS  --  3.0  HGB 15.6 14.4  HCT 43.8 40.0  MCV 85.4 84.9  PLT 130* 053*   Basic Metabolic Panel:  Recent Labs Lab 08/26/16 1717 08/28/16 1658  NA 132* 131*  K 4.1 4.0  CL 95* 97*  CO2 23 22  GLUCOSE 214* 221*  BUN 14 21*  CREATININE 1.26* 0.99  CALCIUM 9.5 9.1   GFR: Estimated Creatinine Clearance: 61.1 mL/min (by C-G formula based on SCr of 0.99 mg/dL). Liver Function Tests:  Recent Labs Lab 08/28/16 1658  AST 22  ALT 18  ALKPHOS 71  BILITOT 1.1  PROT 7.0  ALBUMIN 3.8    Recent Labs Lab 08/28/16 1658  LIPASE 23   Cardiac Enzymes:  Recent Labs Lab 08/28/16 1658  TROPONINI <0.03    Radiological Exams on Admission: Dg Chest 2 View  Result Date: 08/26/2016 CLINICAL DATA:  Dyspnea EXAM: CHEST  2 VIEW COMPARISON:  Chest radiograph 05/30/2015 FINDINGS: The lungs are mildly hyperinflated with diffusely prominent pulmonary markings. Cardiomediastinal contours are normal. No focal consolidation or pulmonary edema. No pneumothorax or pleural  effusion. IMPRESSION: Hyperexpanded lungs, suggesting COPD.  No acute abnormality. Electronically Signed   By: Ulyses Jarred M.D.   On: 08/26/2016 23:34   Ct Head Wo Contrast  Result Date: 08/28/2016 CLINICAL DATA:  Weakness. Weight loss. Abdominal pain. Headache and blurred vision. EXAM: CT HEAD WITHOUT CONTRAST TECHNIQUE: Contiguous axial images were obtained from the base of the skull through the vertex without intravenous contrast. COMPARISON:  01/28/2010 FINDINGS: Brain: No mass lesion, hemorrhage, hydrocephalus, acute infarct, intra-axial, or extra-axial fluid collection. Vascular: No hyperdense vessel or unexpected calcification. Skull: Normal Sinuses/Orbits: Normal imaged portions of the orbits and globes. Minimal mucosal thickening of left maxillary sinus. Clear mastoid air  cells. Other: None. IMPRESSION: No acute intracranial abnormality. Electronically Signed   By: Abigail Miyamoto M.D.   On: 08/28/2016 19:10   Ct Angio Chest Pe W And/or Wo Contrast  Result Date: 08/28/2016 CLINICAL DATA:  Generalize weakness with right-sided abdominal pain radiating to the back EXAM: CT ANGIOGRAPHY CHEST CT ABDOMEN AND PELVIS WITH CONTRAST TECHNIQUE: Multidetector CT imaging of the chest was performed using the standard protocol during bolus administration of intravenous contrast. Multiplanar CT image reconstructions and MIPs were obtained to evaluate the vascular anatomy. Multidetector CT imaging of the abdomen and pelvis was performed using the standard protocol during bolus administration of intravenous contrast. CONTRAST:  100 mL Isovue 370 intravenous COMPARISON:  Chest x-ray 08/26/2016, CT abdomen pelvis 10/02/2009 FINDINGS: CTA CHEST FINDINGS Cardiovascular: Satisfactory opacification of the pulmonary arteries to the segmental level. Filling defect within the distal main right pulmonary artery consistent with thrombus. Multiple filling defects extending into multiple branches of right upper, middle, and lower  lobe pulmonary arterial system. Additional thrombus within the distal left pulmonary artery with extension of thrombus into left upper and lower lobe pulmonary arterial branch vessels. RV LV ratio is elevated at 1.0. There is atherosclerotic calcification within the aorta. No aneurysmal dilatation or dissection is seen. There are coronary artery calcifications. The heart is nonenlarged. No pericardial effusion. Mediastinum/Nodes: Esophagus grossly unremarkable. Midline trachea. Thyroid within normal limits. Scattered lymph nodes in the mediastinum, largest is seen anterior to the carina and measures 9 mm. Lungs/Pleura: Mild emphysematous disease is present. No pneumothorax or pleural effusion is seen. 11 x 13 mm right middle lobe pulmonary nodule, series 7, image number 65. Musculoskeletal: No acute osseous abnormality. Review of the MIP images confirms the above findings. CT ABDOMEN and PELVIS FINDINGS Hepatobiliary: Mild fatty infiltration of the liver. No biliary dilatation. No focal hepatic abnormality. No calcified gallstones. Pancreas: Unremarkable. No pancreatic ductal dilatation or surrounding inflammatory changes. Spleen: Enlarged at 15 cm Adrenals/Urinary Tract: Adrenal glands are within normal limits. Punctate nonobstructing stones in the lower pole left kidney. Mild right hydronephrosis secondary to a 2.2 cm stone in the right renal pelvis. Additional 6 mm stone in the lower pole of the right kidney. 1 cm cyst mid right kidney. Bladder unremarkable. Stomach/Bowel: Slight gastric wall thickening but underdistended. No dilated small bowel. Normal appendix. Moderate stool in the colon. Extensive sigmoid colon diverticular disease without acute inflammation. Vascular/Lymphatic: Extensive vascular calcifications. Moderate stenosis suspected at the origins of the celiac trunk and SMA. No grossly enlarged abdominal or pelvic lymph nodes Reproductive: Enlarged prostate gland with calcifications. Other: No free  air or free fluid. Musculoskeletal: Degenerative changes of the spine. No acute or suspicious bone lesions. Review of the MIP images confirms the above findings. IMPRESSION: 1. The examination is positive for bilateral acute pulmonary embolus involving the distal main pulmonary arteries with thrombus present in the bilateral upper, bilateral lower, and right middle lobe arterial branches. Positive for acute PE with CT evidence of right heart strain (RV/LV Ratio = 1.0) consistent with at least submassive (intermediate risk) PE. The presence of right heart strain has been associated with an increased risk of morbidity and mortality. Please activate Code PE by paging [phone]. 2. No evidence for aortic dissection 3. Mild emphysema. There is a 12 mm pulmonary nodule in the right middle lobe ; further evaluation with PET-CT could be considered. 4. Splenomegaly 5. Mild right hydronephrosis secondary to a 2.2 cm stone in the right renal pelvis at the UPJ. Nonobstructing stones in the  left kidney 6. Prostatomegaly 7. Normal appendix. Extensive sigmoid colon diverticular disease without acute inflammation Critical Value/emergent results were called by telephone at the time of interpretation on 08/28/2016 at 7:56 pm to Dr. Dorie Rank , who verbally acknowledged these results. Electronically Signed   By: Donavan Foil M.D.   On: 08/28/2016 19:56   Ct Abdomen Pelvis W Contrast  Result Date: 08/28/2016 CLINICAL DATA:  Generalize weakness with right-sided abdominal pain radiating to the back EXAM: CT ANGIOGRAPHY CHEST CT ABDOMEN AND PELVIS WITH CONTRAST TECHNIQUE: Multidetector CT imaging of the chest was performed using the standard protocol during bolus administration of intravenous contrast. Multiplanar CT image reconstructions and MIPs were obtained to evaluate the vascular anatomy. Multidetector CT imaging of the abdomen and pelvis was performed using the standard protocol during bolus administration of intravenous  contrast. CONTRAST:  100 mL Isovue 370 intravenous COMPARISON:  Chest x-ray 08/26/2016, CT abdomen pelvis 10/02/2009 FINDINGS: CTA CHEST FINDINGS Cardiovascular: Satisfactory opacification of the pulmonary arteries to the segmental level. Filling defect within the distal main right pulmonary artery consistent with thrombus. Multiple filling defects extending into multiple branches of right upper, middle, and lower lobe pulmonary arterial system. Additional thrombus within the distal left pulmonary artery with extension of thrombus into left upper and lower lobe pulmonary arterial branch vessels. RV LV ratio is elevated at 1.0. There is atherosclerotic calcification within the aorta. No aneurysmal dilatation or dissection is seen. There are coronary artery calcifications. The heart is nonenlarged. No pericardial effusion. Mediastinum/Nodes: Esophagus grossly unremarkable. Midline trachea. Thyroid within normal limits. Scattered lymph nodes in the mediastinum, largest is seen anterior to the carina and measures 9 mm. Lungs/Pleura: Mild emphysematous disease is present. No pneumothorax or pleural effusion is seen. 11 x 13 mm right middle lobe pulmonary nodule, series 7, image number 65. Musculoskeletal: No acute osseous abnormality. Review of the MIP images confirms the above findings. CT ABDOMEN and PELVIS FINDINGS Hepatobiliary: Mild fatty infiltration of the liver. No biliary dilatation. No focal hepatic abnormality. No calcified gallstones. Pancreas: Unremarkable. No pancreatic ductal dilatation or surrounding inflammatory changes. Spleen: Enlarged at 15 cm Adrenals/Urinary Tract: Adrenal glands are within normal limits. Punctate nonobstructing stones in the lower pole left kidney. Mild right hydronephrosis secondary to a 2.2 cm stone in the right renal pelvis. Additional 6 mm stone in the lower pole of the right kidney. 1 cm cyst mid right kidney. Bladder unremarkable. Stomach/Bowel: Slight gastric wall thickening  but underdistended. No dilated small bowel. Normal appendix. Moderate stool in the colon. Extensive sigmoid colon diverticular disease without acute inflammation. Vascular/Lymphatic: Extensive vascular calcifications. Moderate stenosis suspected at the origins of the celiac trunk and SMA. No grossly enlarged abdominal or pelvic lymph nodes Reproductive: Enlarged prostate gland with calcifications. Other: No free air or free fluid. Musculoskeletal: Degenerative changes of the spine. No acute or suspicious bone lesions. Review of the MIP images confirms the above findings. IMPRESSION: 1. The examination is positive for bilateral acute pulmonary embolus involving the distal main pulmonary arteries with thrombus present in the bilateral upper, bilateral lower, and right middle lobe arterial branches. Positive for acute PE with CT evidence of right heart strain (RV/LV Ratio = 1.0) consistent with at least submassive (intermediate risk) PE. The presence of right heart strain has been associated with an increased risk of morbidity and mortality. Please activate Code PE by paging [phone]. 2. No evidence for aortic dissection 3. Mild emphysema. There is a 12 mm pulmonary nodule in the right middle lobe ;  further evaluation with PET-CT could be considered. 4. Splenomegaly 5. Mild right hydronephrosis secondary to a 2.2 cm stone in the right renal pelvis at the UPJ. Nonobstructing stones in the left kidney 6. Prostatomegaly 7. Normal appendix. Extensive sigmoid colon diverticular disease without acute inflammation Critical Value/emergent results were called by telephone at the time of interpretation on 08/28/2016 at 7:56 pm to Dr. Dorie Rank , who verbally acknowledged these results. Electronically Signed   By: Donavan Foil M.D.   On: 08/28/2016 19:56    EKG: Independently reviewed. nsr no acute issues Old chart reviewed Case discussed with dr Wille Glaser knapp cxr reviewed no edema or infiltrate  Assessment/Plan 71 yo  male found to have bilateral pulmonary emboli  Principal Problem:   PE (pulmonary thromboembolism) ([place])- vitals all stable, oxygen sats normal on RA.  Place on hep drip.  Serial trop, initial one is normal.  Monitor closely in stepdown overnight.  Active Problems:   PARKINSON'S DISEASE- stable   Essential hypertension- stable   COPD (chronic obstructive pulmonary disease) ([facility])- cont home nebs   PAD (peripheral artery disease) ([facility])- stable, cont plavix   CAD (coronary artery disease)- stable   Pulmonary nodule- will need outpatient follow up, to r/o malignancy   Ureteral stone with hydronephrosis- this will also need follow up with urology at some point   Chest pain- due to PE as above   Abdominal pain- also likely due to PE    DVT prophylaxis:  scds Code Status:  full Family Communication:  none  Disposition Plan:  Per day team Consults called:  none Admission status:   admission   Carsten Carstarphen A MD [facility]  If 7PM-7AM, please contact night-coverage [email or web address] Password [facility]  08/28/2016, 8:28 PM

## 2016-08-28 NOTE — ED Notes (Signed)
Pt states he does not need to urinate at this time.

## 2016-08-28 NOTE — ED Provider Notes (Signed)
[facility] DEPT Provider Note   CSN: [id number] Arrival date & time: 08/28/16  1239     History   Chief Complaint Chief Complaint  Patient presents with  . Headache    HPI Matthew Wilkins is a 71 y.o. male.  HPI Patient presents to the emergency room for evaluation of persistent complaints of difficulty with headaches, blurred vision, flashing lights in his right eye,  Worsening speech, and pain in the right side of his chest and abdomen associated with decreased appetite. Patient states the symptoms started about a week ago although he has lost about 30-40 pounds in the last 8 months since his wife's death. The last 8 months patient has had decreased appetite. When he eats he gets some pain in his right upper abdomen and his flank area. Patient states the increasing pain issue is primarily been over the last week. He was seen at [facility] emergency room 2 days ago. He had laboratory tests that just showed hematuria as well as a mild increase in his creatinine. According to the notes the patient wanted to be discharged and he was released. Patient came back to the emergency room because he is not feeling any better.  Past Medical History:  Diagnosis Date  . Anxiety   . Arthritis   . Coronary artery disease   . Diabetes mellitus   . Hypercholesterolemia   . Hypertension   . PAD (peripheral artery disease) ([place])   . SOB (shortness of breath)     Patient Active Problem List   Diagnosis Date Noted  . PE (pulmonary thromboembolism) ([place]) 08/28/2016  . Coronary artery disease involving native coronary artery of native heart with unstable angina pectoris ([place])   . TIA (transient ischemic attack) 11/27/2011  . Research study patient 08/30/2011  . PAD (peripheral artery disease) ([place]) 06/30/2011  . PVD (peripheral vascular disease) ([name]) 01/10/2011  . COPD 07/06/2010  . TOBACCO ABUSE 11/12/2008  . CAD 09/08/2008  . DM 08/26/2008  . HYPERCHOLESTEROLEMIA  IIA 08/26/2008  .  ANXIETY 08/26/2008  . PARKINSON'S DISEASE 08/26/2008  . HYPERTENSION, UNSPECIFIED 08/26/2008  . ARTHRITIS 08/26/2008  . SHORTNESS OF BREATH 08/26/2008    Past Surgical History:  Procedure Laterality Date  . CARDIAC CATHETERIZATION N/A 06/10/2015   Procedure: Left Heart Cath and Coronary Angiography;  Surgeon: Burnell Blanks, MD;  Location: [facility];  Service: Cardiovascular;  Laterality: N/A;  . LOWER EXTREMITY ANGIOGRAM N/A 07/13/2011   Procedure: LOWER EXTREMITY ANGIOGRAM;  Surgeon: Burnell Blanks, MD;  Location: [facility] CATH LAB;  Service: Cardiovascular;  Laterality: N/A;  . OTHER SURGICAL HISTORY    . OTHER SURGICAL HISTORY    . OTHER SURGICAL HISTORY    . PERCUTANEOUS STENT INTERVENTION Right 07/13/2011   Procedure: PERCUTANEOUS STENT INTERVENTION;  Surgeon: Burnell Blanks, MD;  Location: [facility] CATH LAB;  Service: Cardiovascular;  Laterality: Right;       Home Medications    Prior to Admission medications   Medication Sig Start Date End Date Taking? Authorizing Provider  clopidogrel (PLAVIX) 75 MG tablet Take 1 tablet (75 mg total) by mouth daily. 10/07/14   Burnell Blanks, MD  glipiZIDE (GLUCOTROL XL) 5 MG 24 hr tablet Take 5 mg by mouth daily. 03/11/15   Historical Provider, MD  levothyroxine (SYNTHROID) 25 MCG tablet Take 1 tablet (25 mcg total) by mouth daily before breakfast. 10/07/14   Burnell Blanks, MD  lisinopril (PRINIVIL,ZESTRIL) 5 MG tablet Take 5 mg by mouth daily. 05/21/15  Historical Provider, MD  pantoprazole (PROTONIX) 40 MG tablet Take 40 mg by mouth daily. 04/22/15   Historical Provider, MD  rOPINIRole (REQUIP) 3 MG tablet Take 3 mg by mouth 4 (four) times daily as needed (Restless Leg).  04/10/15   Historical Provider, MD  rosuvastatin (CRESTOR) 20 MG tablet Take 1 tablet (20 mg total) by mouth daily. 10/07/14   Burnell Blanks, MD  tiotropium (SPIRIVA) 18 MCG inhalation capsule Place 18 mcg into inhaler and inhale  daily as needed (Shortness Of Breath).    Historical Provider, MD  ZETIA 10 MG tablet Take 10 mg by mouth daily. 04/03/15   Historical Provider, MD    Family History Family History  Problem Relation Age of Onset  . Restless legs syndrome Brother     Social History Social History  Substance Use Topics  . Smoking status: Current Every Day Smoker    Packs/day: 1.50    Types: Cigarettes  . Smokeless tobacco: Never Used  . Alcohol use No     Allergies   Aspirin   Review of Systems Review of Systems  All other systems reviewed and are negative.    Physical Exam Updated Vital Signs BP (!) 124/52   Pulse 71   Temp 98.4 F (36.9 C) (Tympanic)   Resp 15   Ht '5\' 9"'$  (1.753 m)   Wt 64 kg   SpO2 100%   BMI 20.82 kg/m   Physical Exam  Constitutional: He is oriented to person, place, and time. No distress.  Underweight  HENT:  Head: Normocephalic and atraumatic.  Right Ear: External ear normal.  Left Ear: External ear normal.  Mouth/Throat: Oropharynx is clear and moist.  Eyes: Conjunctivae are normal. Right eye exhibits no discharge. Left eye exhibits no discharge. No scleral icterus.  Neck: Neck supple. No tracheal deviation present.  Cardiovascular: Normal rate, regular rhythm and intact distal pulses.   Pulmonary/Chest: Effort normal and breath sounds normal. No stridor. No respiratory distress. He has no wheezes. He has no rales.  Abdominal: Soft. Bowel sounds are normal. He exhibits no distension. There is tenderness in the right upper quadrant and epigastric area. There is no rigidity, no rebound and no guarding. No hernia.  Musculoskeletal: He exhibits no edema or tenderness.  Neurological: He is alert and oriented to person, place, and time. He displays tremor. No cranial nerve deficit (No facial droop, extraocular movements intact, tongue midline  , speech slightly difficult to understand but no aphasia, not clearly dysarthric) or sensory deficit. He exhibits  normal muscle tone. He displays no seizure activity. Coordination normal.  No pronator drift bilateral upper extrem, able to hold both legs off bed for 5 seconds, sensation intact in all extremities, no visual field cuts, no left or right sided neglect, normal finger-nose exam bilaterally, no nystagmus noted, generalized weakness with tremor when lifting arms and legs off the bed   Skin: Skin is warm and dry. No rash noted.  Psychiatric: He has a normal mood and affect.  Nursing note and vitals reviewed.    ED Treatments / Results  Labs (all labs ordered are listed, but only abnormal results are displayed) Labs Reviewed  COMPREHENSIVE METABOLIC PANEL - Abnormal; Notable for the following:       Result Value   Sodium 131 (*)    Chloride 97 (*)    Glucose, Bld 221 (*)    BUN 21 (*)    All other components within normal limits  CBC WITH DIFFERENTIAL/PLATELET - Abnormal;  Notable for the following:    Platelets 121 (*)    All other components within normal limits  LIPASE, BLOOD  TROPONIN I  URINALYSIS, ROUTINE W REFLEX MICROSCOPIC  PROTIME-INR    EKG  EKG Interpretation  Date/Time:  Sunday August 28 2016 12:56:36 EDT Ventricular Rate:  76 PR Interval:  126 QRS Duration: 78 QT Interval:  368 QTC Calculation: 414 R Axis:   75 Text Interpretation:  Normal sinus rhythm Normal ECG No significant change since last tracing Confirmed by Herminia Warren  MD-J, Cloma Rahrig ([id number]) on 08/28/2016 6:53:02 PM       Radiology Dg Chest 2 View  Result Date: 08/26/2016 CLINICAL DATA:  Dyspnea EXAM: CHEST  2 VIEW COMPARISON:  Chest radiograph 05/30/2015 FINDINGS: The lungs are mildly hyperinflated with diffusely prominent pulmonary markings. Cardiomediastinal contours are normal. No focal consolidation or pulmonary edema. No pneumothorax or pleural effusion. IMPRESSION: Hyperexpanded lungs, suggesting COPD.  No acute abnormality. Electronically Signed   By: Ulyses Jarred M.D.   On: 08/26/2016 23:34   Ct Head Wo  Contrast  Result Date: 08/28/2016 CLINICAL DATA:  Weakness. Weight loss. Abdominal pain. Headache and blurred vision. EXAM: CT HEAD WITHOUT CONTRAST TECHNIQUE: Contiguous axial images were obtained from the base of the skull through the vertex without intravenous contrast. COMPARISON:  01/28/2010 FINDINGS: Brain: No mass lesion, hemorrhage, hydrocephalus, acute infarct, intra-axial, or extra-axial fluid collection. Vascular: No hyperdense vessel or unexpected calcification. Skull: Normal Sinuses/Orbits: Normal imaged portions of the orbits and globes. Minimal mucosal thickening of left maxillary sinus. Clear mastoid air cells. Other: None. IMPRESSION: No acute intracranial abnormality. Electronically Signed   By: Abigail Miyamoto M.D.   On: 08/28/2016 19:10   Ct Angio Chest Pe W And/or Wo Contrast  Result Date: 08/28/2016 CLINICAL DATA:  Generalize weakness with right-sided abdominal pain radiating to the back EXAM: CT ANGIOGRAPHY CHEST CT ABDOMEN AND PELVIS WITH CONTRAST TECHNIQUE: Multidetector CT imaging of the chest was performed using the standard protocol during bolus administration of intravenous contrast. Multiplanar CT image reconstructions and MIPs were obtained to evaluate the vascular anatomy. Multidetector CT imaging of the abdomen and pelvis was performed using the standard protocol during bolus administration of intravenous contrast. CONTRAST:  100 mL Isovue 370 intravenous COMPARISON:  Chest x-ray 08/26/2016, CT abdomen pelvis 10/02/2009 FINDINGS: CTA CHEST FINDINGS Cardiovascular: Satisfactory opacification of the pulmonary arteries to the segmental level. Filling defect within the distal main right pulmonary artery consistent with thrombus. Multiple filling defects extending into multiple branches of right upper, middle, and lower lobe pulmonary arterial system. Additional thrombus within the distal left pulmonary artery with extension of thrombus into left upper and lower lobe pulmonary arterial  branch vessels. RV LV ratio is elevated at 1.0. There is atherosclerotic calcification within the aorta. No aneurysmal dilatation or dissection is seen. There are coronary artery calcifications. The heart is nonenlarged. No pericardial effusion. Mediastinum/Nodes: Esophagus grossly unremarkable. Midline trachea. Thyroid within normal limits. Scattered lymph nodes in the mediastinum, largest is seen anterior to the carina and measures 9 mm. Lungs/Pleura: Mild emphysematous disease is present. No pneumothorax or pleural effusion is seen. 11 x 13 mm right middle lobe pulmonary nodule, series 7, image number 65. Musculoskeletal: No acute osseous abnormality. Review of the MIP images confirms the above findings. CT ABDOMEN and PELVIS FINDINGS Hepatobiliary: Mild fatty infiltration of the liver. No biliary dilatation. No focal hepatic abnormality. No calcified gallstones. Pancreas: Unremarkable. No pancreatic ductal dilatation or surrounding inflammatory changes. Spleen: Enlarged at 15 cm Adrenals/Urinary Tract:  Adrenal glands are within normal limits. Punctate nonobstructing stones in the lower pole left kidney. Mild right hydronephrosis secondary to a 2.2 cm stone in the right renal pelvis. Additional 6 mm stone in the lower pole of the right kidney. 1 cm cyst mid right kidney. Bladder unremarkable. Stomach/Bowel: Slight gastric wall thickening but underdistended. No dilated small bowel. Normal appendix. Moderate stool in the colon. Extensive sigmoid colon diverticular disease without acute inflammation. Vascular/Lymphatic: Extensive vascular calcifications. Moderate stenosis suspected at the origins of the celiac trunk and SMA. No grossly enlarged abdominal or pelvic lymph nodes Reproductive: Enlarged prostate gland with calcifications. Other: No free air or free fluid. Musculoskeletal: Degenerative changes of the spine. No acute or suspicious bone lesions. Review of the MIP images confirms the above findings.  IMPRESSION: 1. The examination is positive for bilateral acute pulmonary embolus involving the distal main pulmonary arteries with thrombus present in the bilateral upper, bilateral lower, and right middle lobe arterial branches. Positive for acute PE with CT evidence of right heart strain (RV/LV Ratio = 1.0) consistent with at least submassive (intermediate risk) PE. The presence of right heart strain has been associated with an increased risk of morbidity and mortality. Please activate Code PE by paging [phone]. 2. No evidence for aortic dissection 3. Mild emphysema. There is a 12 mm pulmonary nodule in the right middle lobe ; further evaluation with PET-CT could be considered. 4. Splenomegaly 5. Mild right hydronephrosis secondary to a 2.2 cm stone in the right renal pelvis at the UPJ. Nonobstructing stones in the left kidney 6. Prostatomegaly 7. Normal appendix. Extensive sigmoid colon diverticular disease without acute inflammation Critical Value/emergent results were called by telephone at the time of interpretation on 08/28/2016 at 7:56 pm to Dr. Dorie Rank , who verbally acknowledged these results. Electronically Signed   By: Donavan Foil M.D.   On: 08/28/2016 19:56   Ct Abdomen Pelvis W Contrast  Result Date: 08/28/2016 CLINICAL DATA:  Generalize weakness with right-sided abdominal pain radiating to the back EXAM: CT ANGIOGRAPHY CHEST CT ABDOMEN AND PELVIS WITH CONTRAST TECHNIQUE: Multidetector CT imaging of the chest was performed using the standard protocol during bolus administration of intravenous contrast. Multiplanar CT image reconstructions and MIPs were obtained to evaluate the vascular anatomy. Multidetector CT imaging of the abdomen and pelvis was performed using the standard protocol during bolus administration of intravenous contrast. CONTRAST:  100 mL Isovue 370 intravenous COMPARISON:  Chest x-ray 08/26/2016, CT abdomen pelvis 10/02/2009 FINDINGS: CTA CHEST FINDINGS Cardiovascular:  Satisfactory opacification of the pulmonary arteries to the segmental level. Filling defect within the distal main right pulmonary artery consistent with thrombus. Multiple filling defects extending into multiple branches of right upper, middle, and lower lobe pulmonary arterial system. Additional thrombus within the distal left pulmonary artery with extension of thrombus into left upper and lower lobe pulmonary arterial branch vessels. RV LV ratio is elevated at 1.0. There is atherosclerotic calcification within the aorta. No aneurysmal dilatation or dissection is seen. There are coronary artery calcifications. The heart is nonenlarged. No pericardial effusion. Mediastinum/Nodes: Esophagus grossly unremarkable. Midline trachea. Thyroid within normal limits. Scattered lymph nodes in the mediastinum, largest is seen anterior to the carina and measures 9 mm. Lungs/Pleura: Mild emphysematous disease is present. No pneumothorax or pleural effusion is seen. 11 x 13 mm right middle lobe pulmonary nodule, series 7, image number 65. Musculoskeletal: No acute osseous abnormality. Review of the MIP images confirms the above findings. CT ABDOMEN and PELVIS FINDINGS Hepatobiliary: Mild fatty  infiltration of the liver. No biliary dilatation. No focal hepatic abnormality. No calcified gallstones. Pancreas: Unremarkable. No pancreatic ductal dilatation or surrounding inflammatory changes. Spleen: Enlarged at 15 cm Adrenals/Urinary Tract: Adrenal glands are within normal limits. Punctate nonobstructing stones in the lower pole left kidney. Mild right hydronephrosis secondary to a 2.2 cm stone in the right renal pelvis. Additional 6 mm stone in the lower pole of the right kidney. 1 cm cyst mid right kidney. Bladder unremarkable. Stomach/Bowel: Slight gastric wall thickening but underdistended. No dilated small bowel. Normal appendix. Moderate stool in the colon. Extensive sigmoid colon diverticular disease without acute  inflammation. Vascular/Lymphatic: Extensive vascular calcifications. Moderate stenosis suspected at the origins of the celiac trunk and SMA. No grossly enlarged abdominal or pelvic lymph nodes Reproductive: Enlarged prostate gland with calcifications. Other: No free air or free fluid. Musculoskeletal: Degenerative changes of the spine. No acute or suspicious bone lesions. Review of the MIP images confirms the above findings. IMPRESSION: 1. The examination is positive for bilateral acute pulmonary embolus involving the distal main pulmonary arteries with thrombus present in the bilateral upper, bilateral lower, and right middle lobe arterial branches. Positive for acute PE with CT evidence of right heart strain (RV/LV Ratio = 1.0) consistent with at least submassive (intermediate risk) PE. The presence of right heart strain has been associated with an increased risk of morbidity and mortality. Please activate Code PE by paging [phone]. 2. No evidence for aortic dissection 3. Mild emphysema. There is a 12 mm pulmonary nodule in the right middle lobe ; further evaluation with PET-CT could be considered. 4. Splenomegaly 5. Mild right hydronephrosis secondary to a 2.2 cm stone in the right renal pelvis at the UPJ. Nonobstructing stones in the left kidney 6. Prostatomegaly 7. Normal appendix. Extensive sigmoid colon diverticular disease without acute inflammation Critical Value/emergent results were called by telephone at the time of interpretation on 08/28/2016 at 7:56 pm to Dr. Dorie Rank , who verbally acknowledged these results. Electronically Signed   By: Donavan Foil M.D.   On: 08/28/2016 19:56    Procedures .Critical Care Performed by: Dorie Rank Authorized by: Dorie Rank   Critical care provider statement:    Critical care time (minutes):  69   Critical care was time spent personally by me on the following activities:  Discussions with consultants, evaluation of patient's response to treatment,  examination of patient, ordering and performing treatments and interventions, ordering and review of laboratory studies, ordering and review of radiographic studies, pulse oximetry, re-evaluation of patient's condition, obtaining history from patient or surrogate and review of old charts   (including critical care time)  Medications Ordered in ED Medications  sodium chloride 0.9 % bolus 1,000 mL (0 mLs Intravenous Stopped 08/28/16 1808)    And  0.9 %  sodium chloride infusion ( Intravenous Paused 08/28/16 1745)  heparin bolus via infusion 4,000 Units (not administered)  heparin ADULT infusion 100 units/mL (25000 units/276m sodium chloride 0.45%) (not administered)  morphine 4 MG/ML injection 4 mg (4 mg Intravenous Given 08/28/16 1707)  iopamidol (ISOVUE-370) 76 % injection 100 mL (100 mLs Intravenous Contrast Given 08/28/16 1808)     Initial Impression / Assessment and Plan / ED Course  I have reviewed the triage vital signs and the nursing notes.  Pertinent labs & imaging results that were available during my care of the patient were reviewed by me and considered in my medical decision making (see chart for details).  Clinical Course as of Aug 29 2022  Sun Aug 28, 2016  1954 Verbal report from radiologist.  Pt has PEs on chest ct.  Also has a kidney stone  [JK]  1955 Discussed findings with patient. Will start on heparin.  Awaiting for formal reports and will then arrange for admission.  [JK]  2003 Will consult with PCCM for possible code PE although pt remains hemodynamically stable.  Will consult with medical service for admission  [JK]  2021 Discussed with Dr Tamala Julian, PCCM.  Recommends usual treatment considering pt is hemodynamically stable.    [JK]    Clinical Course User Index [JK] Dorie Rank, MD    Patient presented to the emergency room with several complaints. CT scan does demonstrate acute pulmonary embolism. I suspect this is the cause for the patient's generalized weakness and  malaise.  He is hemodynamically stable. I have ordered IV heparin. Consultation with pulmonary critical care and the medical service for admission.  Patient also has a UPJ stone causing mild hydro-. I suspect this caused the hematuria that was noted to days ago.  I do not think he requires any emergent urologic evaluation especially considering his pulmonary embolism. Plan on admission to hospital for further treatment Final Clinical Impressions(s) / ED Diagnoses   Final diagnoses:  Other acute pulmonary embolism with acute cor pulmonale ([place])  Renal stone  Pulmonary nodule    New Prescriptions New Prescriptions   No medications on file     Dorie Rank, MD 08/28/16 2024

## 2016-08-28 NOTE — ED Triage Notes (Signed)
Patient has multiple complaints. Patient states that he "hurts all over." Per patient headache with blurred vision/"flashing lights in right eye" and dizziness x5 days. Daughter reports change in speech. Per daughter symptoms over 5 days.  Per patient generalized weakness. Patient also right abd pain that radiates into back. Denies any nausea, vomiting, or diarrhea. Patient seen at [facility] on 4/13 with the same symptoms and had EKG, x-rays, and blood work with no answers. Patient instructed to see PCP but states he can't take the pain any longer. Patient states can only sit up for short periods of time.

## 2016-08-28 NOTE — Progress Notes (Signed)
ANTICOAGULATION CONSULT NOTE - Initial Consult  Pharmacy Consult for heparin Indication: PE  Allergies  Allergen Reactions  . Aspirin Other (See Comments)    Nose bleeds     Patient Measurements: Height: '5\' 9"'$  (175.3 cm) Weight: 141 lb (64 kg) IBW/kg (Calculated) : 70.7 Heparin Dosing Weight: 64 kg  Vital Signs: Temp: 98.4 F (36.9 C) (04/15 1247) Temp Source: Tympanic (04/15 1247) BP: 124/52 (04/15 1930) Pulse Rate: 71 (04/15 1930)  Labs:  Recent Labs  08/26/16 1717 08/28/16 1658  HGB 15.6 14.4  HCT 43.8 40.0  PLT 130* 121*  CREATININE 1.26* 0.99  TROPONINI  --  <0.03    Estimated Creatinine Clearance: 61.1 mL/min (by C-G formula based on SCr of 0.99 mg/dL).   Medical History: Past Medical History:  Diagnosis Date  . Anxiety   . Arthritis   . Coronary artery disease   . Diabetes mellitus   . Hypercholesterolemia   . Hypertension   . PAD (peripheral artery disease) ([place])   . SOB (shortness of breath)     Medications:   Assessment: 71 yo male ED patient, CT angio chest positive for PE Labs reviewed PTA medications reviewed.  Goal of Therapy:  Heparin level 0.3 -0.7 Monitor platelets by anticoagulation protocol    Plan:  Heparin 4000 unit IV bolus, then heparin infusion 1000 units/hour Heparin level in 8 hours Monitor CBC, platelets   Matthew Wilkins, Matthew Wilkins Matthew Wilkins 08/28/2016,8:16 PM

## 2016-08-29 ENCOUNTER — Inpatient Hospital Stay (HOSPITAL_COMMUNITY): Payer: Medicare HMO

## 2016-08-29 DIAGNOSIS — D696 Thrombocytopenia, unspecified: Secondary | ICD-10-CM

## 2016-08-29 LAB — TROPONIN I
Troponin I: <0.03 ng/mL (ref <0.03)
Troponin I: <0.03 ng/mL (ref <0.03)

## 2016-08-29 LAB — CBC
HCT: 40.3 % (ref 39.0–52.0)
HEMATOCRIT: 40.6 % (ref 39.0–52.0)
HEMOGLOBIN: 14.1 g/dL (ref 13.0–17.0)
Hemoglobin: 14.2 g/dL (ref 13.0–17.0)
MCH: 30.1 pg (ref 26.0–34.0)
MCH: 30.2 pg (ref 26.0–34.0)
MCHC: 35 g/dL (ref 30.0–36.0)
MCHC: 35 g/dL (ref 30.0–36.0)
MCV: 86 fL (ref 78.0–100.0)
MCV: 86.3 fL (ref 78.0–100.0)
PLATELETS: 146 10*3/uL — AB (ref 150–400)
Platelets: 138 10*3/uL — ABNORMAL LOW (ref 150–400)
RBC: 4.72 MIL/uL (ref 4.22–5.81)
RBC: 4.67 MIL/uL (ref 4.22–5.81)
RDW: 13.5 % (ref 11.5–15.5)
RDW: 13.4 % (ref 11.5–15.5)
WBC: 4.5 10*3/uL (ref 4.0–10.5)
WBC: 5 10*3/uL (ref 4.0–10.5)

## 2016-08-29 LAB — GLUCOSE, CAPILLARY
GLUCOSE-CAPILLARY: 219 mg/dL — AB (ref 65–99)
Glucose-Capillary: 204 mg/dL — ABNORMAL HIGH (ref 65–99)
Glucose-Capillary: 182 mg/dL — ABNORMAL HIGH (ref 65–99)
Glucose-Capillary: 177 mg/dL — ABNORMAL HIGH (ref 65–99)

## 2016-08-29 LAB — PROTIME-INR
INR: 1.09
Prothrombin Time: 14.2 seconds (ref 11.4–15.2)

## 2016-08-29 LAB — BASIC METABOLIC PANEL
ANION GAP: 10 (ref 5–15)
BUN: 16 mg/dL (ref 6–20)
CHLORIDE: 96 mmol/L — AB (ref 101–111)
CO2: 26 mmol/L (ref 22–32)
CREATININE: 1.02 mg/dL (ref 0.61–1.24)
Calcium: 8.5 mg/dL — ABNORMAL LOW (ref 8.9–10.3)
GFR calc non Af Amer: >60 mL/min (ref >60)
Glucose, Bld: 264 mg/dL — ABNORMAL HIGH (ref 65–99)
POTASSIUM: 3.7 mmol/L (ref 3.5–5.1)
Sodium: 132 mmol/L — ABNORMAL LOW (ref 135–145)

## 2016-08-29 LAB — MRSA PCR SCREENING: MRSA BY PCR: NEGATIVE

## 2016-08-29 LAB — HEPARIN LEVEL (UNFRACTIONATED)
Heparin Unfractionated: 0.2 IU/mL — ABNORMAL LOW (ref 0.30–0.70)
Heparin Unfractionated: 0.34 IU/mL (ref 0.30–0.70)

## 2016-08-29 MED ORDER — GLIPIZIDE ER 5 MG PO TB24
5.0000 mg | ORAL_TABLET | Freq: Every day | ORAL | Status: DC
  Administered 2016-08-30 – 2016-08-31 (×2): 5 mg via ORAL
  Filled 2016-08-29 (×2): qty 1

## 2016-08-29 MED ORDER — LIVING WELL WITH DIABETES BOOK
Freq: Once | Status: DC
  Filled 2016-08-29: qty 1

## 2016-08-29 MED ORDER — GABAPENTIN 300 MG PO CAPS
300.0000 mg | ORAL_CAPSULE | Freq: Three times a day (TID) | ORAL | Status: DC
  Administered 2016-08-29 – 2016-08-31 (×7): 300 mg via ORAL
  Filled 2016-08-29 (×7): qty 1

## 2016-08-29 MED ORDER — TIOTROPIUM BROMIDE MONOHYDRATE 18 MCG IN CAPS
1.0000 | ORAL_CAPSULE | Freq: Every day | RESPIRATORY_TRACT | Status: DC
  Administered 2016-08-30 – 2016-08-31 (×2): 18 ug via RESPIRATORY_TRACT
  Filled 2016-08-29: qty 5

## 2016-08-29 MED ORDER — PANTOPRAZOLE SODIUM 40 MG PO TBEC
40.0000 mg | DELAYED_RELEASE_TABLET | Freq: Every day | ORAL | Status: DC
  Administered 2016-08-29 – 2016-08-31 (×3): 40 mg via ORAL
  Filled 2016-08-29 (×3): qty 1

## 2016-08-29 MED ORDER — ROSUVASTATIN CALCIUM 20 MG PO TABS
20.0000 mg | ORAL_TABLET | Freq: Every day | ORAL | Status: DC
  Administered 2016-08-29 – 2016-08-31 (×3): 20 mg via ORAL
  Filled 2016-08-29 (×3): qty 1

## 2016-08-29 MED ORDER — LEVOTHYROXINE SODIUM 25 MCG PO TABS
25.0000 ug | ORAL_TABLET | Freq: Every day | ORAL | Status: DC
  Administered 2016-08-30 – 2016-08-31 (×2): 25 ug via ORAL
  Filled 2016-08-29 (×2): qty 1

## 2016-08-29 MED ORDER — SERTRALINE HCL 50 MG PO TABS
50.0000 mg | ORAL_TABLET | Freq: Every day | ORAL | Status: DC
  Administered 2016-08-29 – 2016-08-31 (×3): 50 mg via ORAL
  Filled 2016-08-29 (×3): qty 1

## 2016-08-29 NOTE — Progress Notes (Signed)
Inpatient Diabetes Program Recommendations  AACE/ADA: New Consensus Statement on Inpatient Glycemic Control (2015)  Target Ranges:  Prepandial:   less than 140 mg/dL      Peak postprandial:   less than 180 mg/dL (1-2 hours)      Critically ill patients:  140 - 180 mg/dL   Results for Matthew Wilkins, Matthew Wilkins (MRN [id number]) as of 08/29/2016 12:17  Ref. Range 08/29/2016 07:27 08/29/2016 11:11  Glucose-Capillary Latest Ref Range: 65 - 99 mg/dL 204 (H) 219 (H)   Review of Glycemic Control  Diabetes history: DM2 Outpatient Diabetes medications: Glipizide XL 5 mg daily, Metformin XR 1000 mg BID Current orders for Inpatient glycemic control: Glipizide 5 mg QAM (to start on 08/30/16)  Inpatient Diabetes Program Recommendations: Correction (SSI): Note patient is refusing to use SQ insulin as an inpatient. HgbA1C: Please consider adding on an A1C to blood in lab or draw with next scheduled blood draw to evaluate glycemic control over the past 2-3 months. Oral DM medication: Please consider starting Glipizide today since patient is refusing to use SQ insulin.  Thanks, Barnie Alderman, RN, MSN, CDE Diabetes Coordinator Inpatient Diabetes Program [phone] (Team Pager from 8am to 5pm)

## 2016-08-29 NOTE — Care Management Note (Signed)
Case Management Note  Patient Details  Name: SHENANDOAH VANDERGRIFF MRN: [id number] Date of Birth: 1946-04-28  Subjective/Objective:  Adm with Bilateral PE. From home alone, ind with ADL's. Denies need for home health RN. Will transition to Eliquis soon.                 Action/Plan: CM will follow for needs and give patient Eliquis discount voucher.   Expected Discharge Date:  08/31/16               Expected Discharge Plan:  Home/Self Care (vs HH )  In-House Referral:     Discharge planning Services  CM Consult, Medication Assistance  Post Acute Care Choice:    Choice offered to:  Patient  DME Arranged:    DME Agency:     HH Arranged:    [place] Agency:     Status of Service:  In process, will continue to follow  If discussed at Long Length of Stay Meetings, dates discussed:    Additional Comments:  Margrete Delude, Chauncey Reading, RN 08/29/2016, 1:53 PM

## 2016-08-29 NOTE — Progress Notes (Signed)
ANTICOAGULATION CONSULT NOTE -   Pharmacy Consult for heparin Indication: PE  Allergies  Allergen Reactions  . Aspirin Other (See Comments)    Nose bleeds     Patient Measurements: Height: '5\' 9"'$  (175.3 cm) Weight: 141 lb 1.5 oz (64 kg) IBW/kg (Calculated) : 70.7 Heparin Dosing Weight: 64 kg  Vital Signs: Temp: 97.7 F (36.5 C) (04/16 1158) Temp Source: Oral (04/16 1158) BP: 114/60 (04/16 1100) Pulse Rate: 68 (04/16 1100)  Labs:  Recent Labs  08/26/16 1717 08/28/16 1658 08/28/16 2055 08/28/16 2308 08/29/16 0404 08/29/16 1341  HGB 15.6 14.4  --   --  14.2  14.1  --   HCT 43.8 40.0  --   --  40.6  40.3  --   PLT 130* 121*  --   --  138*  146*  --   LABPROT  --   --  16.5*  --  14.2  --   INR  --   --  1.32  --  1.09  --   HEPARINUNFRC  --   --   --   --  0.20* 0.34  CREATININE 1.26* 0.99  --   --  1.02  --   TROPONINI  --  <0.03  --  <0.03 <0.03  --     Estimated Creatinine Clearance: 59.3 mL/min (by C-G formula based on SCr of 1.02 mg/dL).   Medical History: Past Medical History:  Diagnosis Date  . Anxiety   . Arthritis   . Coronary artery disease   . Diabetes mellitus   . Hypercholesterolemia   . Hypertension   . PAD (peripheral artery disease) ([place])   . SOB (shortness of breath)     Medications:   Assessment: 71 yo man on heparin drip for PE.  Heparin level this afternoon therapeutic.  Goal of Therapy:  Heparin level 0.3 -0.7 Monitor platelets by anticoagulation protocol    Plan:  Cont heparin infusion at 1200 units/hour Daily HL and CBC while on heparin   Andy Moye Poteet 08/29/2016,3:45 PM

## 2016-08-29 NOTE — Plan of Care (Signed)
Problem: Health Behavior/Discharge Planning: Goal: Ability to manage health-related needs will improve Outcome: Not Progressing Patient is having difficulty at home with eating and taking his medications. Educated him on the need to take his medicines at home and also to eat to keep his health up.

## 2016-08-29 NOTE — Progress Notes (Signed)
PROGRESS NOTE  Matthew Wilkins IRS:[id number] DOB: 02/05/1946 DOA: 08/28/2016 PCP: Matthew Seller, MD  Brief Narrative: 49yom PMH DM, CAD presented with multiple complaints including CP, RIQ pain, right flank pain, abdominal pain, weight loss and OD visual disturbance. Found to have bilateral PE.  Assessment/Plan 1. Bilateral PE with CT-evidence of right heart strain with associated CP. Etiology unclear. EDP d/w PCCM recommended standard therapy, no invasive treatment. Reportedly active, rides bike, mows gross.   Hemodynamics stable. Troponins negative. Asymptomatic and no hypoxia.  Continue anticoagulation with heparin today, transition to Eliquis tomorrow if stable.  Check Echo  Check BLE venous u/s.  2. Mild right hydronephrosis secondary to Right UPJ stone. Creatinine WNL.  Outpatient urology f/u. Follow BMP  3. Dehydration without renal dysfunction.  IVF, follow BMP  4. Thrombocytopenia, stable, mild, likely secondary to splenomegaly.  Follow CBC daily x2.  5. HA, blurred vision, OD flashing lights. Vision preserved. Outpatient f/u with ophthalmology.  6. Weight loss 30-40 pounds last 8 months since wife's death. History suggests FTT but given PE and lung nodule, some concern for occult malignancy though none seen on CT chest, abd, pelvis.  f/u as an outpatient  Consult dietician  7. DM type 2 not on insulin  Stable. Refuses SSI. Resume glipizide.  8. CAD on Plavix, rosuvastatin. 9. Will ask cardiology if can d/c Plavix now that pt will require anticoagulation.  10. COPD on Spiriva. Stable, no hypoxia.  11. 12 mm pulmonary nodule RML.   Consider outpatient PET-CT.  12. Prostatomegaly.   Consider outpatient PSA and w/u, will defer to urology.   Despite PE appears stable. May be able to go home 48 hours.  DVT prophylaxis: heparin infusion Code Status: full Family Communication: none Disposition Plan: home    Matthew Hodgkins, MD  [facility] Direct contact: [phone] --Via [place]  --[email or web address]; password [email or web address]  7PM-7AM contact night coverage as above 08/29/2016, 10:17 AM  LOS: 1 day   Consultants:    Procedures:    Antimicrobials:    Interval history/Subjective: Feels ok, no SOB, no CP. Has right flank pain. C/o floaters and lights OD x1 or more weeks. Good vision in that eye. Bifrontal headache. No h/o blood clots, leg pain or leg swelling.  Objective: Vitals:   08/29/16 0400 08/29/16 0435 08/29/16 0600 08/29/16 0800  BP:   107/62   Pulse:   66   Resp:   15   Temp: 98.6 F (37 C)   97.8 F (36.6 C)  TempSrc: Oral   Oral  SpO2:   93%   Weight:  64 kg (141 lb 1.5 oz)    Height:        Intake/Output Summary (Last 24 hours) at 08/29/16 1017 Last data filed at 08/29/16 0400  Gross per 24 hour  Intake             1304 ml  Output              400 ml  Net              904 ml     Filed Weights   08/28/16 1248 08/28/16 2219 08/29/16 0435  Weight: 64 kg (141 lb) 64.2 kg (141 lb 8.6 oz) 64 kg (141 lb 1.5 oz)    Exam:    Constitutional: appears calm and comfortable, non-toxic Eyes: pupils round and equal; lids and irises and conjunctiva appear normal ENT: grossly normal hearing, lips and tongue CV: RRR no m/r/g. No  LE edema. Telemetry SR. Resp: CTA bilaterally, no w/r/r. Normal respiratory effort. Abd: soft, ntnd, no RUQ pain with deep palpation, no HSM appreciated Skin: no rash or induration or pain or nodules noted Psych: alert, grossly normal mood and affect.  I have personally reviewed the following:   Labs:  CMP: sodium 131, creatinine and LFTS WNL  troponins negative  CBC: Plts 121>>>>138; Hgb 14.2  Imaging studies:  CT head NAD  CTA chest, abd/pelvis: bilateral PE with right heart strain, COPD  22m pulmonary nodule RMP: consider PET-CT  Mild right hydornephosis, UPJ stone  Prostatomegaly  Splenomegaly   Medical tests:  EKG independently reviewed:  SR, no acute changes  Scheduled Meds: . clopidogrel  75 mg Oral Daily  . gabapentin  300 mg Oral TID  . glipiZIDE  5 mg Oral Daily  . [START ON 08/30/2016] levothyroxine  25 mcg Oral QAC breakfast  . pantoprazole  40 mg Oral Daily  . rosuvastatin  20 mg Oral Daily  . sertraline  50 mg Oral Daily  . sodium chloride flush  3 mL Intravenous Q12H  . tiotropium  1 capsule Inhalation Daily   Continuous Infusions: . heparin 1,200 Units/hr (08/29/16 02376    Principal Problem:   PE (pulmonary thromboembolism) (HCC) Active Problems:   PARKINSON'S DISEASE   COPD (chronic obstructive pulmonary disease) (HCC)   CAD (coronary artery disease)   Pulmonary nodule   Ureteral stone with hydronephrosis   Chest pain   Thrombocytopenia ([place]   LOS: 1 day

## 2016-08-29 NOTE — Progress Notes (Addendum)
ANTICOAGULATION CONSULT NOTE -   Pharmacy Consult for heparin Indication: PE  Allergies  Allergen Reactions  . Aspirin Other (See Comments)    Nose bleeds     Patient Measurements: Height: '5\' 9"'$  (175.3 cm) Weight: 141 lb 1.5 oz (64 kg) IBW/kg (Calculated) : 70.7 Heparin Dosing Weight: 64 kg  Vital Signs: Temp: 98.6 F (37 C) (04/16 0400) Temp Source: Oral (04/16 0400) BP: 107/62 (04/16 0600) Pulse Rate: 66 (04/16 0600)  Labs:  Recent Labs  08/26/16 1717 08/28/16 1658 08/28/16 2055 08/28/16 2308 08/29/16 0404  HGB 15.6 14.4  --   --  14.2  14.1  HCT 43.8 40.0  --   --  40.6  40.3  PLT 130* 121*  --   --  138*  146*  LABPROT  --   --  16.5*  --  14.2  INR  --   --  1.32  --  1.09  HEPARINUNFRC  --   --   --   --  0.20*  CREATININE 1.26* 0.99  --   --  1.02  TROPONINI  --  <0.03  --  <0.03 <0.03    Estimated Creatinine Clearance: 59.3 mL/min (by C-G formula based on SCr of 1.02 mg/dL).   Medical History: Past Medical History:  Diagnosis Date  . Anxiety   . Arthritis   . Coronary artery disease   . Diabetes mellitus   . Hypercholesterolemia   . Hypertension   . PAD (peripheral artery disease) ([place])   . SOB (shortness of breath)     Medications:   Assessment: 71 yo male ED patient, CT angio chest positive for PE Labs reviewed PTA medications reviewed. Initial heparin level below goal  Goal of Therapy:  Heparin level 0.3 -0.7 Monitor platelets by anticoagulation protocol    Plan:  Increase heparin infusion to 1200 units/hour Heparin level in 6 hours Monitor CBC, platelets   Abner Greenspan, Talyia Allende Bennett 08/29/2016,6:53 AM

## 2016-08-29 NOTE — Progress Notes (Signed)
Patient refused his insulin this morning. Patient's sugar was 202 and I went in to give him 3U and when I told him what I had to give him he said that he wanted to wait and take "to see if he died or not". I explained to him what the insulin was and why we were giving it to him, I then explained to him that he was a full code and if something were to happen to him right now we would do everything to save his life. He then went on to tell me that his wife died here and we had been giving her shots too. I explained that I did not know exactly why we were giving his wife shots but again explained why he needed insulin. He said that he just wanted to take it later.

## 2016-08-30 ENCOUNTER — Inpatient Hospital Stay (HOSPITAL_COMMUNITY): Payer: Medicare HMO

## 2016-08-30 DIAGNOSIS — I2699 Other pulmonary embolism without acute cor pulmonale: Secondary | ICD-10-CM

## 2016-08-30 DIAGNOSIS — I82412 Acute embolism and thrombosis of left femoral vein: Secondary | ICD-10-CM

## 2016-08-30 DIAGNOSIS — E440 Moderate protein-calorie malnutrition: Secondary | ICD-10-CM | POA: Insufficient documentation

## 2016-08-30 LAB — BASIC METABOLIC PANEL
Anion gap: 6 (ref 5–15)
BUN: 12 mg/dL (ref 6–20)
CHLORIDE: 103 mmol/L (ref 101–111)
CO2: 25 mmol/L (ref 22–32)
CREATININE: 0.8 mg/dL (ref 0.61–1.24)
Calcium: 8.6 mg/dL — ABNORMAL LOW (ref 8.9–10.3)
Glucose, Bld: 189 mg/dL — ABNORMAL HIGH (ref 65–99)
POTASSIUM: 4.2 mmol/L (ref 3.5–5.1)
SODIUM: 134 mmol/L — AB (ref 135–145)

## 2016-08-30 LAB — ECHOCARDIOGRAM COMPLETE
CHL CUP RV SYS PRESS: 21 mmHg
E decel time: 324 msec
E/e' ratio: 9.33
FS: 43 % (ref 28–44)
HEIGHTINCHES: 69 in
IV/PV OW: 0.76
LA ID, A-P, ES: 38 mm
LA diam end sys: 38 mm
LADIAMINDEX: 2.13 cm/m2
LAVOL: 50.7 mL
LAVOLA4C: 46.5 mL
LAVOLIN: 28.5 mL/m2
LV E/e' medial: 9.33
LV E/e'average: 9.33
LV PW d: 13.6 mm — AB (ref 0.6–1.1)
LV TDI E'LATERAL: 9.46
LVELAT: 9.46 cm/s
LVOT area: 3.14 cm2
LVOT diameter: 20 mm
MV Dec: 324
MV Peak grad: 3 mmHg
MV pk E vel: 88.3 m/s
MVPKAVEL: 105 m/s
RV LATERAL S' VELOCITY: 30.5 cm/s
Reg peak vel: 211 cm/s
TAPSE: 23.9 mm
TDI e' medial: 8.16
TRMAXVEL: 211 cm/s
WEIGHTICAEL: 2268.09 [oz_av]

## 2016-08-30 LAB — CBC
HCT: 38.6 % — ABNORMAL LOW (ref 39.0–52.0)
HEMOGLOBIN: 13.4 g/dL (ref 13.0–17.0)
MCH: 29.9 pg (ref 26.0–34.0)
MCHC: 34.7 g/dL (ref 30.0–36.0)
MCV: 86.2 fL (ref 78.0–100.0)
PLATELETS: 154 10*3/uL (ref 150–400)
RBC: 4.48 MIL/uL (ref 4.22–5.81)
RDW: 13.3 % (ref 11.5–15.5)
WBC: 4.5 10*3/uL (ref 4.0–10.5)

## 2016-08-30 LAB — GLUCOSE, CAPILLARY
GLUCOSE-CAPILLARY: 170 mg/dL — AB (ref 65–99)
GLUCOSE-CAPILLARY: 240 mg/dL — AB (ref 65–99)
GLUCOSE-CAPILLARY: 237 mg/dL — AB (ref 65–99)
Glucose-Capillary: 191 mg/dL — ABNORMAL HIGH (ref 65–99)

## 2016-08-30 LAB — HEPARIN LEVEL (UNFRACTIONATED): Heparin Unfractionated: 0.43 IU/mL (ref 0.30–0.70)

## 2016-08-30 MED ORDER — ENSURE ENLIVE PO LIQD
237.0000 mL | Freq: Two times a day (BID) | ORAL | Status: DC
  Administered 2016-08-31: 237 mL via ORAL

## 2016-08-30 MED ORDER — APIXABAN 5 MG PO TABS: 5.0000 mg | ORAL_TABLET | Freq: Two times a day (BID) | ORAL | Status: DC

## 2016-08-30 MED ORDER — APIXABAN 5 MG PO TABS
10.0000 mg | ORAL_TABLET | Freq: Two times a day (BID) | ORAL | Status: DC
  Administered 2016-08-30 – 2016-08-31 (×2): 10 mg via ORAL
  Filled 2016-08-30 (×2): qty 2

## 2016-08-30 MED ORDER — ADULT MULTIVITAMIN W/MINERALS CH
1.0000 | ORAL_TABLET | Freq: Every day | ORAL | Status: DC
  Administered 2016-08-30 – 2016-08-31 (×2): 1 via ORAL
  Filled 2016-08-30 (×2): qty 1

## 2016-08-30 NOTE — Progress Notes (Signed)
Initial Nutrition Assessment  DOCUMENTATION CODES:   Non-severe (moderate) malnutrition in context of social or environmental circumstances (death of spouse)  INTERVENTION:  Ensure Enlive po BID, each supplement provides 350 kcal and 20 grams of protein   Add Multivitamin daily due to suspected chronic inadequate micronutrient intake  Recommend social worker follow up regarding possible food insecurity  NUTRITION DIAGNOSIS:   Malnutrition  (moderate) related to poor appetite ( (pt says associated with death of his spouse 6 months ago)) as evidenced by mild depletion of muscle mass, mild depletion of body fat and unplanned wt loss 14%.   GOAL:   Patient will meet greater than or equal to 90% of their needs (to prevent further weight loss and decline in nutritional status)   MONITOR:   PO intake, Weight trends, Supplement acceptance, Labs  REASON FOR ASSESSMENT:   Consult Assessment of nutrition requirement/status  ASSESSMENT: The patient is a 71 yo male with hx of DM, HTN, CAD and SOB. He presents with abdominal pain, dehydrated and has Bilateral PE. He has been started on anticoagulation therapy.    The patients home diet is regular. He is not eating as well since his wife passed away. He doesn't enjoy eating alone or cooking just for one. He only eats when hungry and complains that he has lost his appetite since she died. He is able to cook and says sometimes he will have eggs, biscuit and bacon but mostly has cereal for breakfast. He likes pork chops and liver and pintos. He implied that he  Doesn't always have enough money for food and shops for food early in the month when his check comes in but has to be cautious about his purchases. Questioned pt if he is aware of community food resources but he declined and says he is too proud for that.   Based on pt report  wt is down 14% in the past 8 months which is trending toward significant.   Nutrition-Focused physical exam  completed. Findings are mild upper arms, orbital fat depletion, mild-moderate temporal patellar muscle depletion, and no edema.   Recent Labs Lab 08/28/16 1658 08/29/16 0404 08/30/16 0425  NA 131* 132* 134*  K 4.0 3.7 4.2  CL 97* 96* 103  CO2 '22 26 25  '$ BUN 21* 16 12  CREATININE 0.99 1.02 0.80  CALCIUM 9.1 8.5* 8.6*  GLUCOSE 221* 264* 189*   Labs reviewed. Meds: Crestor, protonix, Zoloft   Diet Order:  Diet heart healthy/carb modified Room service appropriate? Yes; Fluid consistency: Thin  Skin:  Dry scaly bilateral shins  Last BM:  08/27/16  Height:   Ht Readings from Last 1 Encounters:  08/28/16 '5\' 9"'$  (1.753 m)    Weight:   Wt Readings from Last 1 Encounters:  08/30/16 141 lb 12.1 oz (64.3 kg)    Ideal Body Weight:  73 kg  BMI:  Body mass index is 20.93 kg/m.  Estimated Nutritional Needs:   Kcal:    1920-2112 (30-33 kcal/kg) to prevent further wt loss  Protein:   77-90 gr (1.2-1.4 gr/kg) repletion  Fluid:   1.9 liters daily  EDUCATION NEEDS:   Education needs addressed (related to the importance of healthy diet and adeuqate nutrition intake)  Colman Cater MS,RD,CSG,LDN Office: [phone] Pager: [phone]

## 2016-08-30 NOTE — Plan of Care (Signed)
Problem: Health Behavior/Discharge Planning: Goal: Ability to manage health-related needs will improve Outcome: Progressing Patient is understanding of the need to take his medications and eat when he goes home.

## 2016-08-30 NOTE — Progress Notes (Signed)
PROGRESS NOTE  Matthew Wilkins VPX:[id number] DOB: November 01, 1945 DOA: 08/28/2016 PCP: Woody Seller, MD  Brief Narrative: 9yom PMH DM, CAD presented with multiple complaints including CP, RIQ pain, right flank pain, abdominal pain, weight loss and OD visual disturbance. Found to have bilateral PE.  Assessment/Plan 1. Bilateral PE with CT-evidence of right heart strain with associated CP. Etiology unclear. EDP d/w PCCM recommended standard therapy, no invasive treatment. Reportedly active, rides bike, mows gross.   Continue anticoagulation with heparin today, transition to Eliquis tonight if stable.  f/u echo  2. DVT left common femoral, profunda femoral, femoral veins  Heparin >> Eliquis this evening pending echo results  3. Mild right hydronephrosis secondary to Right UPJ stone. Creatinine WNL.  Outpatient urology f/u.    4. Dehydration without renal dysfunction.  Resolved. Stop IVF.  5. Thrombocytopenia, stable, mild, likely secondary to splenomegaly.  Plts now WNL.  6. HA, blurred vision, OD flashing lights. Vision preserved. Outpatient f/u with ophthalmology recommended. 7. Weight loss 30-40 pounds last 8 months since wife's death. History suggests FTT but given PE and lung nodule, some concern for occult malignancy though none seen on CT chest, abd, pelvis.  f/u as an outpatient  f/u dietician consult  8. DM type 2 not on insulin  Stable. Refuses SSI. Continue glipizide.  9. CAD on Plavix, rosuvastatin. Discussed with primary cardiologist Dr. Dollene Cleveland, ok to stop Plavix now that pt on Eliquis.  10. COPD on Spiriva. Stable.  11. 12 mm pulmonary nodule RML.   Consider outpatient PET-CT.  12. Prostatomegaly.   Consider outpatient PSA and w/u, will defer to urology.   Improving. Transfer to medical floor, start Eliquis tonight, possible home 4/18  DVT prophylaxis: heparin infusion Code Status: full Family Communication: none Disposition Plan: home     [name], MD  [facility] Direct contact: [phone] --Via [place]  --[email or web address]; password [email or web address]  7PM-7AM contact night coverage as above 08/30/2016, 7:52 AM  LOS: 2 days   Consultants:    Procedures:    Antimicrobials:    Interval history/Subjective: No new issues; doing well per RN. No chest pain, no SOB.  Objective: Vitals:   08/30/16 0300 08/30/16 0400 08/30/16 0500 08/30/16 0600  BP: (!) 103/56 (!) 92/42 (!) 97/42 (!) 103/42  Pulse: 68 (!) 57 (!) 56 (!) 55  Resp:      Temp:  98.1 F (36.7 C)    TempSrc:  Oral    SpO2: 95% 96% 97% 97%  Weight:   64.3 kg (141 lb 12.1 oz)   Height:        Intake/Output Summary (Last 24 hours) at 08/30/16 0752 Last data filed at 08/30/16 0000  Gross per 24 hour  Intake           244.23 ml  Output              550 ml  Net          -305.77 ml     Filed Weights   08/28/16 2219 08/29/16 0435 08/30/16 0500  Weight: 64.2 kg (141 lb 8.6 oz) 64 kg (141 lb 1.5 oz) 64.3 kg (141 lb 12.1 oz)    Exam: General: appears calm and comfortable CV: RRR no m/r/g. No LE edema. Telemetry SR. Resp: CTA bilaterally, no w/r/r. Normal respiratory effort. Eyes: pupils, irises, lids appear normal ENT: lips and tongue appear normal; grossly normal hearing Psych: grossly normal mood and affect, speech fluent and appropriate Skin: no rash or induration noted.  I have personally reviewed the following:   Labs:  CBG stable  BMP unremarkable  CBC unremarkalbe, normal Hgb and plts  Imaging studies:    Medical tests:  Venous u/s: DVT left common femoral, profunda femoral, femoral veins.  Echo done, report pending  Scheduled Meds: . gabapentin  300 mg Oral TID  . glipiZIDE  5 mg Oral QAC breakfast  . levothyroxine  25 mcg Oral QAC breakfast  . living well with diabetes book   Does not apply Once  . pantoprazole  40 mg Oral Daily  . rosuvastatin  20 mg Oral Daily  . sertraline  50 mg Oral Daily  . sodium  chloride flush  3 mL Intravenous Q12H  . tiotropium  1 capsule Inhalation Daily   Continuous Infusions: . heparin 1,200 Units/hr (08/29/16 1512)    Principal Problem:   PE (pulmonary thromboembolism) ([facility]) Active Problems:   PARKINSON'S DISEASE   COPD (chronic obstructive pulmonary disease) (H[facility])   CAD (coronary artery disease)   Pulmonary nodule   Ureteral stone with hydronephrosis   Chest pain   Thrombocytopenia ([place])   LOS: 2 days

## 2016-08-30 NOTE — Progress Notes (Signed)
*  PRELIMINARY RESULTS* Echocardiogram 2D Echocardiogram has been performed.  Matthew Wilkins 08/30/2016, 4:16 PM

## 2016-08-30 NOTE — Care Management (Signed)
Benefits check as follows for Eliquis:   S/W REE @ [employer] # [phone] opt-2   ELIQUIS 5 MG BID   COVER- YES  CO-PAY- $ 8.35  PRIOR APPROVAL- NO   PREFERRED PHARMACY : [facility]

## 2016-08-30 NOTE — Progress Notes (Addendum)
ANTICOAGULATION CONSULT NOTE -   Pharmacy Consult for Apixaban Indication: PE  Allergies  Allergen Reactions  . Aspirin Other (See Comments)    Nose bleeds    Patient Measurements: Height: '5\' 9"'$  (175.3 cm) Weight: 141 lb 12.1 oz (64.3 kg) IBW/kg (Calculated) : 70.7 Heparin Dosing Weight: 64 kg  Vital Signs: Temp: 98.1 F (36.7 C) (04/17 0755) Temp Source: Oral (04/17 0755) BP: 104/74 (04/17 0800) Pulse Rate: 61 (04/17 0800)  Labs:  Recent Labs  08/28/16 1658 08/28/16 2055 08/28/16 2308 08/29/16 0404 08/29/16 1341 08/30/16 0425  HGB 14.4  --   --  14.2  14.1  --  13.4  HCT 40.0  --   --  40.6  40.3  --  38.6*  PLT 121*  --   --  138*  146*  --  154  LABPROT  --  16.5*  --  14.2  --   --   INR  --  1.32  --  1.09  --   --   HEPARINUNFRC  --   --   --  0.20* 0.34 0.43  CREATININE 0.99  --   --  1.02  --  0.80  TROPONINI <0.03  --  <0.03 <0.03  --   --    Estimated Creatinine Clearance: 75.9 mL/min (by C-G formula based on SCr of 0.8 mg/dL).  Medical History: Past Medical History:  Diagnosis Date  . Anxiety   . Arthritis   . Coronary artery disease   . Diabetes mellitus   . Hypercholesterolemia   . Hypertension   . PAD (peripheral artery disease) ([place])   . SOB (shortness of breath)    Assessment: 71 yo man on heparin drip for PE.  Heparin >> Eliquis this PM.  Goal of Therapy:  Systemic Anticoagulation.  Monitor platelets by anticoagulation protocol   Plan:  Cont heparin infusion at 1200 units/hour Daily HL and CBC while on heparin  Hart Robinsons, PharmD Clinical Pharmacist Pager:  [phone] 08/30/2016  Plan: Heparin stopped. Switched to Apixaban VTE Treatment Dosing. Monitor for signs and symptoms of bleeding. Educate  Pricilla Larsson, [facility] 08/30/2016 6:18 PM

## 2016-08-30 NOTE — Discharge Instructions (Signed)
Information on my medicine - ELIQUIS (apixaban)  This medication education was reviewed with me or my healthcare representative as part of my discharge preparation.   Why was Eliquis prescribed for you? Eliquis was prescribed to treat blood clots that may have been found in the veins of your legs (deep vein thrombosis) or in your lungs (pulmonary embolism) and to reduce the risk of them occurring again.  What do You need to know about Eliquis ? The starting dose is 10 mg (two 5 mg tablets) taken TWICE daily for the FIRST SEVEN (7) DAYS, then on (enter date)  09/05/2016  the dose is reduced to ONE 5 mg tablet taken TWICE daily.  Eliquis may be taken with or without food.   Try to take the dose about the same time in the morning and in the evening. If you have difficulty swallowing the tablet whole please discuss with your pharmacist how to take the medication safely.  Take Eliquis exactly as prescribed and DO NOT stop taking Eliquis without talking to the doctor who prescribed the medication.  Stopping may increase your risk of developing a new blood clot.  Refill your prescription before you run out.  After discharge, you should have regular check-up appointments with your healthcare provider that is prescribing your Eliquis.    What do you do if you miss a dose? If a dose of ELIQUIS is not taken at the scheduled time, take it as soon as possible on the same day and twice-daily administration should be resumed. The dose should not be doubled to make up for a missed dose.  Important Safety Information A possible side effect of Eliquis is bleeding. You should call your healthcare provider right away if you experience any of the following: ? Bleeding from an injury or your nose that does not stop. ? Unusual colored urine (red or dark brown) or unusual colored stools (red or black). ? Unusual bruising for unknown reasons. ? A serious fall or if you hit your head (even if there is no  bleeding).  Some medicines may interact with Eliquis and might increase your risk of bleeding or clotting while on Eliquis. To help avoid this, consult your healthcare provider or pharmacist prior to using any new prescription or non-prescription medications, including herbals, vitamins, non-steroidal anti-inflammatory drugs (NSAIDs) and supplements.  This website has more information on Eliquis (apixaban): http://www.eliquis.com/eliquis/home

## 2016-08-31 DIAGNOSIS — R911 Solitary pulmonary nodule: Secondary | ICD-10-CM

## 2016-08-31 DIAGNOSIS — D696 Thrombocytopenia, unspecified: Secondary | ICD-10-CM

## 2016-08-31 DIAGNOSIS — I2699 Other pulmonary embolism without acute cor pulmonale: Secondary | ICD-10-CM

## 2016-08-31 DIAGNOSIS — I82402 Acute embolism and thrombosis of unspecified deep veins of left lower extremity: Secondary | ICD-10-CM | POA: Diagnosis present

## 2016-08-31 DIAGNOSIS — J449 Chronic obstructive pulmonary disease, unspecified: Secondary | ICD-10-CM

## 2016-08-31 DIAGNOSIS — I824Y2 Acute embolism and thrombosis of unspecified deep veins of left proximal lower extremity: Secondary | ICD-10-CM

## 2016-08-31 DIAGNOSIS — N132 Hydronephrosis with renal and ureteral calculous obstruction: Secondary | ICD-10-CM

## 2016-08-31 LAB — CBC
HCT: 39.3 % (ref 39.0–52.0)
Hemoglobin: 13.6 g/dL (ref 13.0–17.0)
MCH: 29.9 pg (ref 26.0–34.0)
MCHC: 34.6 g/dL (ref 30.0–36.0)
MCV: 86.4 fL (ref 78.0–100.0)
PLATELETS: 191 10*3/uL (ref 150–400)
RBC: 4.55 MIL/uL (ref 4.22–5.81)
RDW: 13.3 % (ref 11.5–15.5)
WBC: 6.9 10*3/uL (ref 4.0–10.5)

## 2016-08-31 MED ORDER — APIXABAN 5 MG PO TABS: ORAL_TABLET | ORAL | 1 refills | Status: DC

## 2016-08-31 NOTE — Progress Notes (Signed)
Patient discharged to home taken to car via wheelchair. Discharge instruction given and verbalized understanding.

## 2016-08-31 NOTE — Discharge Summary (Signed)
Physician Discharge Summary  Matthew Wilkins WUJ:[id number] DOB: 1945/06/14 DOA: 08/28/2016  PCP: Woody Seller, MD  Admit date: 08/28/2016 Discharge date: 08/31/2016  Admitted From: Home Disposition:  Home  Recommendations for Outpatient Follow-up:  1. Follow up with PCP in 1-2 weeks 2. Please obtain BMP/CBC in one week 3. Consider outpatient PET-CT to further evaluate pulmonary nodule 4. Follow-up with urology, 5/30 to further evaluate ureteral stone with hydronephrosis 5. Patient will need outpatient ophthalmology referral for vision disturbances.  Home Health: Equipment/Devices:  Discharge Condition:Stable CODE STATUS:Full code Diet recommendation: Heart Healthy / Carb Modified   Brief/Interim Summary: 86yom PMH DM, CAD presented with multiple complaints including CP, RIQ pain, right flank pain, abdominal pain, weight loss and OD visual disturbance. Found to have bilateral PE  Discharge Diagnoses:  Principal Problem:   PE (pulmonary thromboembolism) ([place]) Active Problems:   PARKINSON'S DISEASE   COPD (chronic obstructive pulmonary disease) (H[facility])   CAD (coronary artery disease)   Pulmonary nodule   Ureteral stone with hydronephrosis   Chest pain   Thrombocytopenia ([facility])   Malnutrition of moderate degree   Left leg DVT ([place])  1. Bilateral pulmonary embolus with CT evidence of right heart strain. Echocardiogram showed preserved ejection fraction. RV cavity size was mildly dilated. Case was discussed with pulmonology who recommended standard treatment with anticoagulation. He was initially treated with intravenous heparin and was transitioned to Eliquis. Clinically, he appears to be improving. He does not have any shortness of breath or chest pain. He'll be discharged on Eliquis for further treatments.  2. Left leg DVT. Anticoagulated as above.  3. Mild right hydronephrosis secondary to right UPJ stone. Creatinine within normal limits. Discussed with neurology who  recommended outpatient follow-up. Appointment has been scheduled.  4. Thrombocytopenia, mild, possibly related to splenomegaly versus consumption due to PE. Platelets are now within normal limits.  5. HA, blurred vision, OD flashing lights. Vision preserved. Outpatient f/u with ophthalmology recommended.  6. Lung nodule. Incidental finding of right middle lobe lung nodule. Due to history of smoking and weight loss, would recommend outpatient PET-CT for further evaluation  7. CAD on plavis, rosuvastatin. Discussed with cardiologist, Dr. Angelena Form who felt it was ok to stop Plavix, now that patient is on elqiuis  8. COPD. On spiriva, stable  Discharge Instructions  Discharge Instructions    Diet - low sodium heart healthy    Complete by:  As directed    Increase activity slowly    Complete by:  As directed      Allergies as of 08/31/2016      Reactions   Aspirin Other (See Comments)   Nose bleeds       Medication List    STOP taking these medications   clopidogrel 75 MG tablet Commonly known as:  PLAVIX     TAKE these medications   apixaban 5 MG Tabs tablet Commonly known as:  ELIQUIS Take '10mg'$  po bid for 7 days then '5mg'$  po bid   gabapentin 300 MG capsule Commonly known as:  NEURONTIN Take 1 capsule by mouth 3 (three) times daily.   glipiZIDE 5 MG 24 hr tablet Commonly known as:  GLUCOTROL XL Take 5 mg by mouth daily.   levothyroxine 25 MCG tablet Commonly known as:  SYNTHROID Take 1 tablet (25 mcg total) by mouth daily before breakfast.   lisinopril 5 MG tablet Commonly known as:  PRINIVIL,ZESTRIL Take 5 mg by mouth daily.   metFORMIN 500 MG 24 hr tablet Commonly known as:  GLUCOPHAGE-XR Take 2 tablets by mouth 2 (two) times daily.   pantoprazole 40 MG tablet Commonly known as:  PROTONIX Take 40 mg by mouth daily.   rOPINIRole 3 MG tablet Commonly known as:  REQUIP Take 3 mg by mouth 4 (four) times daily as needed (Restless Leg).   rosuvastatin 20 MG  tablet Commonly known as:  CRESTOR Take 1 tablet (20 mg total) by mouth daily.   sertraline 50 MG tablet Commonly known as:  ZOLOFT Take 50 mg by mouth daily.   tiotropium 18 MCG inhalation capsule Commonly known as:  SPIRIVA Place 1 capsule into inhaler and inhale daily.   ZETIA 10 MG tablet Generic drug:  ezetimibe Take 10 mg by mouth daily.      Follow-up Information    Nicolette Bang, MD Follow up on 10/12/2016.   Specialty:  Urology Why:  10:30am Contact information: [place] [address] [phone]        Woody Seller, MD. Schedule an appointment as soon as possible for a visit in 2 week(s).   Specialty:  Family Medicine Why:  discuss PET-CT scan to evaluate lung nodule Contact information: [address] [place] [address] [phone]          Allergies  Allergen Reactions  . Aspirin Other (See Comments)    Nose bleeds     Consultations:     Procedures/Studies: Dg Chest 2 View  Result Date: 08/26/2016 CLINICAL DATA:  Dyspnea EXAM: CHEST  2 VIEW COMPARISON:  Chest radiograph 05/30/2015 FINDINGS: The lungs are mildly hyperinflated with diffusely prominent pulmonary markings. Cardiomediastinal contours are normal. No focal consolidation or pulmonary edema. No pneumothorax or pleural effusion. IMPRESSION: Hyperexpanded lungs, suggesting COPD.  No acute abnormality. Electronically Signed   By: Ulyses Jarred M.D.   On: 08/26/2016 23:34   Ct Head Wo Contrast  Result Date: 08/28/2016 CLINICAL DATA:  Weakness. Weight loss. Abdominal pain. Headache and blurred vision. EXAM: CT HEAD WITHOUT CONTRAST TECHNIQUE: Contiguous axial images were obtained from the base of the skull through the vertex without intravenous contrast. COMPARISON:  01/28/2010 FINDINGS: Brain: No mass lesion, hemorrhage, hydrocephalus, acute infarct, intra-axial, or extra-axial fluid collection. Vascular: No hyperdense vessel or unexpected calcification.  Skull: Normal Sinuses/Orbits: Normal imaged portions of the orbits and globes. Minimal mucosal thickening of left maxillary sinus. Clear mastoid air cells. Other: None. IMPRESSION: No acute intracranial abnormality. Electronically Signed   By: Abigail Miyamoto M.D.   On: 08/28/2016 19:10   Ct Angio Chest Pe W And/or Wo Contrast  Result Date: 08/28/2016 CLINICAL DATA:  Generalize weakness with right-sided abdominal pain radiating to the back EXAM: CT ANGIOGRAPHY CHEST CT ABDOMEN AND PELVIS WITH CONTRAST TECHNIQUE: Multidetector CT imaging of the chest was performed using the standard protocol during bolus administration of intravenous contrast. Multiplanar CT image reconstructions and MIPs were obtained to evaluate the vascular anatomy. Multidetector CT imaging of the abdomen and pelvis was performed using the standard protocol during bolus administration of intravenous contrast. CONTRAST:  100 mL Isovue 370 intravenous COMPARISON:  Chest x-ray 08/26/2016, CT abdomen pelvis 10/02/2009 FINDINGS: CTA CHEST FINDINGS Cardiovascular: Satisfactory opacification of the pulmonary arteries to the segmental level. Filling defect within the distal main right pulmonary artery consistent with thrombus. Multiple filling defects extending into multiple branches of right upper, middle, and lower lobe pulmonary arterial system. Additional thrombus within the distal left pulmonary artery with extension of thrombus into left upper and lower lobe pulmonary arterial branch vessels.  RV LV ratio is elevated at 1.0. There is atherosclerotic calcification within the aorta. No aneurysmal dilatation or dissection is seen. There are coronary artery calcifications. The heart is nonenlarged. No pericardial effusion. Mediastinum/Nodes: Esophagus grossly unremarkable. Midline trachea. Thyroid within normal limits. Scattered lymph nodes in the mediastinum, largest is seen anterior to the carina and measures 9 mm. Lungs/Pleura: Mild emphysematous  disease is present. No pneumothorax or pleural effusion is seen. 11 x 13 mm right middle lobe pulmonary nodule, series 7, image number 65. Musculoskeletal: No acute osseous abnormality. Review of the MIP images confirms the above findings. CT ABDOMEN and PELVIS FINDINGS Hepatobiliary: Mild fatty infiltration of the liver. No biliary dilatation. No focal hepatic abnormality. No calcified gallstones. Pancreas: Unremarkable. No pancreatic ductal dilatation or surrounding inflammatory changes. Spleen: Enlarged at 15 cm Adrenals/Urinary Tract: Adrenal glands are within normal limits. Punctate nonobstructing stones in the lower pole left kidney. Mild right hydronephrosis secondary to a 2.2 cm stone in the right renal pelvis. Additional 6 mm stone in the lower pole of the right kidney. 1 cm cyst mid right kidney. Bladder unremarkable. Stomach/Bowel: Slight gastric wall thickening but underdistended. No dilated small bowel. Normal appendix. Moderate stool in the colon. Extensive sigmoid colon diverticular disease without acute inflammation. Vascular/Lymphatic: Extensive vascular calcifications. Moderate stenosis suspected at the origins of the celiac trunk and SMA. No grossly enlarged abdominal or pelvic lymph nodes Reproductive: Enlarged prostate gland with calcifications. Other: No free air or free fluid. Musculoskeletal: Degenerative changes of the spine. No acute or suspicious bone lesions. Review of the MIP images confirms the above findings. IMPRESSION: 1. The examination is positive for bilateral acute pulmonary embolus involving the distal main pulmonary arteries with thrombus present in the bilateral upper, bilateral lower, and right middle lobe arterial branches. Positive for acute PE with CT evidence of right heart strain (RV/LV Ratio = 1.0) consistent with at least submassive (intermediate risk) PE. The presence of right heart strain has been associated with an increased risk of morbidity and mortality. Please  activate Code PE by paging [phone]. 2. No evidence for aortic dissection 3. Mild emphysema. There is a 12 mm pulmonary nodule in the right middle lobe ; further evaluation with PET-CT could be considered. 4. Splenomegaly 5. Mild right hydronephrosis secondary to a 2.2 cm stone in the right renal pelvis at the UPJ. Nonobstructing stones in the left kidney 6. Prostatomegaly 7. Normal appendix. Extensive sigmoid colon diverticular disease without acute inflammation Critical Value/emergent results were called by telephone at the time of interpretation on 08/28/2016 at 7:56 pm to Dr. Dorie Rank , who verbally acknowledged these results. Electronically Signed   By: Donavan Foil M.D.   On: 08/28/2016 19:56   Ct Abdomen Pelvis W Contrast  Result Date: 08/28/2016 CLINICAL DATA:  Generalize weakness with right-sided abdominal pain radiating to the back EXAM: CT ANGIOGRAPHY CHEST CT ABDOMEN AND PELVIS WITH CONTRAST TECHNIQUE: Multidetector CT imaging of the chest was performed using the standard protocol during bolus administration of intravenous contrast. Multiplanar CT image reconstructions and MIPs were obtained to evaluate the vascular anatomy. Multidetector CT imaging of the abdomen and pelvis was performed using the standard protocol during bolus administration of intravenous contrast. CONTRAST:  100 mL Isovue 370 intravenous COMPARISON:  Chest x-ray 08/26/2016, CT abdomen pelvis 10/02/2009 FINDINGS: CTA CHEST FINDINGS Cardiovascular: Satisfactory opacification of the pulmonary arteries to the segmental level. Filling defect within the distal main right pulmonary artery consistent with thrombus. Multiple filling defects extending into multiple branches of  right upper, middle, and lower lobe pulmonary arterial system. Additional thrombus within the distal left pulmonary artery with extension of thrombus into left upper and lower lobe pulmonary arterial branch vessels. RV LV ratio is elevated at 1.0. There is  atherosclerotic calcification within the aorta. No aneurysmal dilatation or dissection is seen. There are coronary artery calcifications. The heart is nonenlarged. No pericardial effusion. Mediastinum/Nodes: Esophagus grossly unremarkable. Midline trachea. Thyroid within normal limits. Scattered lymph nodes in the mediastinum, largest is seen anterior to the carina and measures 9 mm. Lungs/Pleura: Mild emphysematous disease is present. No pneumothorax or pleural effusion is seen. 11 x 13 mm right middle lobe pulmonary nodule, series 7, image number 65. Musculoskeletal: No acute osseous abnormality. Review of the MIP images confirms the above findings. CT ABDOMEN and PELVIS FINDINGS Hepatobiliary: Mild fatty infiltration of the liver. No biliary dilatation. No focal hepatic abnormality. No calcified gallstones. Pancreas: Unremarkable. No pancreatic ductal dilatation or surrounding inflammatory changes. Spleen: Enlarged at 15 cm Adrenals/Urinary Tract: Adrenal glands are within normal limits. Punctate nonobstructing stones in the lower pole left kidney. Mild right hydronephrosis secondary to a 2.2 cm stone in the right renal pelvis. Additional 6 mm stone in the lower pole of the right kidney. 1 cm cyst mid right kidney. Bladder unremarkable. Stomach/Bowel: Slight gastric wall thickening but underdistended. No dilated small bowel. Normal appendix. Moderate stool in the colon. Extensive sigmoid colon diverticular disease without acute inflammation. Vascular/Lymphatic: Extensive vascular calcifications. Moderate stenosis suspected at the origins of the celiac trunk and SMA. No grossly enlarged abdominal or pelvic lymph nodes Reproductive: Enlarged prostate gland with calcifications. Other: No free air or free fluid. Musculoskeletal: Degenerative changes of the spine. No acute or suspicious bone lesions. Review of the MIP images confirms the above findings. IMPRESSION: 1. The examination is positive for bilateral acute  pulmonary embolus involving the distal main pulmonary arteries with thrombus present in the bilateral upper, bilateral lower, and right middle lobe arterial branches. Positive for acute PE with CT evidence of right heart strain (RV/LV Ratio = 1.0) consistent with at least submassive (intermediate risk) PE. The presence of right heart strain has been associated with an increased risk of morbidity and mortality. Please activate Code PE by paging [phone]. 2. No evidence for aortic dissection 3. Mild emphysema. There is a 12 mm pulmonary nodule in the right middle lobe ; further evaluation with PET-CT could be considered. 4. Splenomegaly 5. Mild right hydronephrosis secondary to a 2.2 cm stone in the right renal pelvis at the UPJ. Nonobstructing stones in the left kidney 6. Prostatomegaly 7. Normal appendix. Extensive sigmoid colon diverticular disease without acute inflammation Critical Value/emergent results were called by telephone at the time of interpretation on 08/28/2016 at 7:56 pm to Dr. Dorie Rank , who verbally acknowledged these results. Electronically Signed   By: Donavan Foil M.D.   On: 08/28/2016 19:56   US Venous Img Lower Bilateral  Result Date: 08/29/2016 CLINICAL DATA:  Pulmonary embolism. EXAM: BILATERAL LOWER EXTREMITY VENOUS DOPPLER ULTRASOUND TECHNIQUE: Gray-scale sonography with graded compression, as well as color Doppler and duplex ultrasound were performed to evaluate the lower extremity deep venous systems from the level of the common femoral vein and including the common femoral, femoral, profunda femoral, popliteal and calf veins including the posterior tibial, peroneal and gastrocnemius veins when visible. The superficial great saphenous vein was also interrogated. Spectral Doppler was utilized to evaluate flow at rest and with distal augmentation maneuvers in the common femoral, femoral and popliteal  veins. COMPARISON:  CT chest 08/28/2016. FINDINGS: RIGHT LOWER EXTREMITY Common  Femoral Vein: No evidence of thrombus. Normal compressibility, respiratory phasicity and response to augmentation. Saphenofemoral Junction: No evidence of thrombus. Normal compressibility and flow on color Doppler imaging. Profunda Femoral Vein: No evidence of thrombus. Normal compressibility and flow on color Doppler imaging. Femoral Vein: No evidence of thrombus. Normal compressibility, respiratory phasicity and response to augmentation. Popliteal Vein: No evidence of thrombus. Normal compressibility, respiratory phasicity and response to augmentation. Calf Veins: No evidence of thrombus. Normal compressibility and flow on color Doppler imaging. Superficial Great Saphenous Vein: No evidence of thrombus. Normal compressibility and flow on color Doppler imaging. Venous Reflux:  None. Other Findings:  None. LEFT LOWER EXTREMITY Common Femoral Vein: Nonocclusive thrombosis. Saphenofemoral Junction: No evidence of thrombus. Normal compressibility and flow on color Doppler imaging. Profunda Femoral Vein: Nonocclusive thrombus. Femoral Vein: Occlusive thrombus. Popliteal Vein: No evidence of thrombus. Normal compressibility, respiratory phasicity and response to augmentation. Calf Veins: No evidence of thrombus. Normal compressibility and flow on color Doppler imaging. Superficial Great Saphenous Vein: No evidence of thrombus. Normal compressibility and flow on color Doppler imaging. Venous Reflux:  None. Other Findings:  None. IMPRESSION: 1. Nonocclusive thrombus in the left common femoral and left profunda femoral veins with occlusive thrombus in the left femoral vein. Patient started heparin prior to this exam. 2. No evidence of deep venous thrombosis in the right lower extremity. Electronically Signed   By: Lorin Picket M.D.   On: 08/29/2016 15:28   Echo:- Mild LVH with LVEF 60-65% and grade 1 diastolic dysfunction.   Mildly calcified mitral annulus. Trivial mitral regurgitation.   Mildly sclerotic aortic  valve. Mildly dilated right ventricle.   Trivial tricuspid regurgitation with PASP estimated 21 mmHg.   Cannot exclude PFO.    Subjective: No chest pain or shortness of breath  Discharge Exam: Vitals:   08/31/16 0400 08/31/16 0753  BP:    Pulse:    Resp:    Temp: 98 F (36.7 C) 97.9 F (36.6 C)   Vitals:   08/31/16 0400 08/31/16 0500 08/31/16 0742 08/31/16 0753  BP:      Pulse:      Resp:      Temp: 98 F (36.7 C)   97.9 F (36.6 C)  TempSrc: Oral   Oral  SpO2:   98% 100%  Weight:  64 kg (141 lb 1.5 oz)    Height:        General: Pt is alert, awake, not in acute distress Cardiovascular: RRR, S1/S2 +, no rubs, no gallops Respiratory: CTA bilaterally, no wheezing, no rhonchi Abdominal: Soft, NT, ND, bowel sounds + Extremities: no edema, no cyanosis    The results of significant diagnostics from this hospitalization (including imaging, microbiology, ancillary and laboratory) are listed below for reference.     Microbiology: Recent Results (from the past 240 hour(s))  MRSA PCR Screening     Status: None   Collection Time: 08/28/16 10:19 PM  Result Value Ref Range Status   MRSA by PCR NEGATIVE NEGATIVE Final    Comment:        The GeneXpert MRSA Assay (FDA approved for NASAL specimens only), is one component of a comprehensive MRSA colonization surveillance program. It is not intended to diagnose MRSA infection nor to guide or monitor treatment for MRSA infections.      Labs: BNP (last 3 results) No results for input(s): BNP in the last 8760 hours. Basic Metabolic Panel:  Recent Labs  Lab 08/26/16 1717 08/28/16 1658 08/29/16 0404 08/30/16 0425  NA 132* 131* 132* 134*  K 4.1 4.0 3.7 4.2  CL 95* 97* 96* 103  CO2 '23 22 26 25  '$ GLUCOSE 214* 221* 264* 189*  BUN 14 21* 16 12  CREATININE 1.26* 0.99 1.02 0.80  CALCIUM 9.5 9.1 8.5* 8.6*   Liver Function Tests:  Recent Labs Lab 08/28/16 1658  AST 22  ALT 18  ALKPHOS 71  BILITOT 1.1  PROT 7.0   ALBUMIN 3.8    Recent Labs Lab 08/28/16 1658  LIPASE 23   No results for input(s): AMMONIA in the last 168 hours. CBC:  Recent Labs Lab 08/26/16 1717 08/28/16 1658 08/29/16 0404 08/30/16 0425 08/31/16 0416  WBC 5.2 4.7 4.5  5.0 4.5 6.9  NEUTROABS  --  3.0  --   --   --   HGB 15.6 14.4 14.2  14.1 13.4 13.6  HCT 43.8 40.0 40.6  40.3 38.6* 39.3  MCV 85.4 84.9 86.0  86.3 86.2 86.4  PLT 130* 121* 138*  146* 154 191   Cardiac Enzymes:  Recent Labs Lab 08/28/16 1658 08/28/16 2308 08/29/16 0404  TROPONINI <0.03 <0.03 <0.03   BNP: Invalid input(s): POCBNP CBG:  Recent Labs Lab 08/29/16 2103 08/30/16 0734 08/30/16 1129 08/30/16 1616 08/30/16 2058  GLUCAP 177* 170* 240* 191* 237*   D-Dimer No results for input(s): DDIMER in the last 72 hours. Hgb A1c No results for input(s): HGBA1C in the last 72 hours. Lipid Profile No results for input(s): CHOL, HDL, LDLCALC, TRIG, CHOLHDL, LDLDIRECT in the last 72 hours. Thyroid function studies No results for input(s): TSH, T4TOTAL, T3FREE, THYROIDAB in the last 72 hours.  Invalid input(s): FREET3 Anemia work up No results for input(s): VITAMINB12, FOLATE, FERRITIN, TIBC, IRON, RETICCTPCT in the last 72 hours. Urinalysis    Component Value Date/Time   COLORURINE YELLOW 08/28/2016 1929   APPEARANCEUR CLEAR 08/28/2016 1929   LABSPEC >1.046 (H) 08/28/2016 1929   PHURINE 6.0 08/28/2016 1929   GLUCOSEU >=500 (A) 08/28/2016 [place] NEGATIVE 08/28/2016 [place] NEGATIVE 08/28/2016 1929   KETONESUR 20 (A) 08/28/2016 1929   PROTEINUR NEGATIVE 08/28/2016 1929   UROBILINOGEN 0.2 01/27/2010 1529   NITRITE NEGATIVE 08/28/2016 1929   LEUKOCYTESUR NEGATIVE 08/28/2016 1929   Sepsis Labs Invalid input(s): PROCALCITONIN,  WBC,  LACTICIDVEN Microbiology Recent Results (from the past 240 hour(s))  MRSA PCR Screening     Status: None   Collection Time: 08/28/16 10:19 PM  Result Value Ref Range Status   MRSA  by PCR NEGATIVE NEGATIVE Final    Comment:        The GeneXpert MRSA Assay (FDA approved for NASAL specimens only), is one component of a comprehensive MRSA colonization surveillance program. It is not intended to diagnose MRSA infection nor to guide or monitor treatment for MRSA infections.      Time coordinating discharge: Over 30 minutes  SIGNED:   Kathie Dike, MD  [facility] 08/31/2016, 10:15 AM Pager   If 7PM-7AM, please contact night-coverage [email or web address] Password [email or web address]

## 2016-08-31 NOTE — Care Management Important Message (Signed)
Important Message  Patient Details  Name: CORNELIO PARKERSON MRN: [id number] Date of Birth: May 17, 1945   Medicare Important Message Given:  Yes    Shakila Mak, Chauncey Reading, RN 08/31/2016, 11:17 AM

## 2016-08-31 NOTE — Care Management (Signed)
CM discussed Eliquis voucher with patient again. Patient understands that he needs to f/u with PCP for refills.

## 2016-09-09 DIAGNOSIS — G20 Parkinson's disease: Secondary | ICD-10-CM | POA: Diagnosis not present

## 2016-09-09 DIAGNOSIS — K219 Gastro-esophageal reflux disease without esophagitis: Secondary | ICD-10-CM | POA: Diagnosis not present

## 2016-09-09 DIAGNOSIS — E46 Unspecified protein-calorie malnutrition: Secondary | ICD-10-CM | POA: Diagnosis not present

## 2016-09-09 DIAGNOSIS — R911 Solitary pulmonary nodule: Secondary | ICD-10-CM | POA: Diagnosis not present

## 2016-09-09 DIAGNOSIS — J449 Chronic obstructive pulmonary disease, unspecified: Secondary | ICD-10-CM | POA: Diagnosis not present

## 2016-09-09 DIAGNOSIS — E1142 Type 2 diabetes mellitus with diabetic polyneuropathy: Secondary | ICD-10-CM | POA: Diagnosis not present

## 2016-09-09 DIAGNOSIS — N200 Calculus of kidney: Secondary | ICD-10-CM | POA: Diagnosis not present

## 2016-09-09 DIAGNOSIS — I824Y2 Acute embolism and thrombosis of unspecified deep veins of left proximal lower extremity: Secondary | ICD-10-CM | POA: Diagnosis not present

## 2016-09-09 DIAGNOSIS — I2609 Other pulmonary embolism with acute cor pulmonale: Secondary | ICD-10-CM | POA: Diagnosis not present

## 2016-09-09 DIAGNOSIS — I2510 Atherosclerotic heart disease of native coronary artery without angina pectoris: Secondary | ICD-10-CM | POA: Diagnosis not present

## 2016-09-14 ENCOUNTER — Other Ambulatory Visit (HOSPITAL_COMMUNITY): Payer: Self-pay | Admitting: Family Medicine

## 2016-09-14 ENCOUNTER — Other Ambulatory Visit: Payer: Self-pay | Admitting: Physician Assistant

## 2016-09-14 DIAGNOSIS — R911 Solitary pulmonary nodule: Secondary | ICD-10-CM

## 2016-09-22 ENCOUNTER — Ambulatory Visit (HOSPITAL_COMMUNITY)
Admission: RE | Admit: 2016-09-22 | Discharge: 2016-09-22 | Disposition: A | Discharge status: Home / Self Care | Payer: Medicare HMO | Source: Ambulatory Visit | Attending: Family Medicine | Admitting: Family Medicine

## 2016-09-22 DIAGNOSIS — J439 Emphysema, unspecified: Secondary | ICD-10-CM | POA: Diagnosis not present

## 2016-09-22 DIAGNOSIS — R911 Solitary pulmonary nodule: Secondary | ICD-10-CM | POA: Insufficient documentation

## 2016-09-22 DIAGNOSIS — N200 Calculus of kidney: Secondary | ICD-10-CM | POA: Diagnosis not present

## 2016-09-22 DIAGNOSIS — K5730 Diverticulosis of large intestine without perforation or abscess without bleeding: Secondary | ICD-10-CM | POA: Diagnosis not present

## 2016-09-22 DIAGNOSIS — R590 Localized enlarged lymph nodes: Secondary | ICD-10-CM | POA: Insufficient documentation

## 2016-09-22 DIAGNOSIS — I700 Atherosclerosis of aorta: Secondary | ICD-10-CM | POA: Insufficient documentation

## 2016-09-22 DIAGNOSIS — I2510 Atherosclerotic heart disease of native coronary artery without angina pectoris: Secondary | ICD-10-CM | POA: Diagnosis not present

## 2016-09-22 DIAGNOSIS — N1330 Unspecified hydronephrosis: Secondary | ICD-10-CM | POA: Insufficient documentation

## 2016-09-22 DIAGNOSIS — N400 Enlarged prostate without lower urinary tract symptoms: Secondary | ICD-10-CM | POA: Diagnosis not present

## 2016-09-22 LAB — GLUCOSE, CAPILLARY: Glucose-Capillary: 98 mg/dL (ref 65–99)

## 2016-09-22 MED ORDER — FLUDEOXYGLUCOSE F - 18 (FDG) INJECTION
7.2000 | Freq: Once | INTRAVENOUS | Status: DC | PRN
Start: 2016-09-22 — End: 2016-09-28

## 2016-10-12 ENCOUNTER — Ambulatory Visit: Payer: Medicare HMO | Admitting: Urology

## 2016-10-14 ENCOUNTER — Emergency Department (HOSPITAL_COMMUNITY)
Admission: EM | Admit: 2016-10-14 | Discharge: 2016-10-14 | Disposition: A | Discharge status: Home / Self Care | Payer: Medicare HMO | Attending: Emergency Medicine | Admitting: Emergency Medicine

## 2016-10-14 ENCOUNTER — Encounter (HOSPITAL_COMMUNITY): Payer: Self-pay

## 2016-10-14 DIAGNOSIS — Z79899 Other long term (current) drug therapy: Secondary | ICD-10-CM | POA: Insufficient documentation

## 2016-10-14 DIAGNOSIS — L03115 Cellulitis of right lower limb: Secondary | ICD-10-CM | POA: Insufficient documentation

## 2016-10-14 DIAGNOSIS — R69 Illness, unspecified: Secondary | ICD-10-CM | POA: Diagnosis not present

## 2016-10-14 DIAGNOSIS — F17210 Nicotine dependence, cigarettes, uncomplicated: Secondary | ICD-10-CM | POA: Insufficient documentation

## 2016-10-14 DIAGNOSIS — Z7984 Long term (current) use of oral hypoglycemic drugs: Secondary | ICD-10-CM | POA: Diagnosis not present

## 2016-10-14 DIAGNOSIS — E119 Type 2 diabetes mellitus without complications: Secondary | ICD-10-CM | POA: Insufficient documentation

## 2016-10-14 DIAGNOSIS — G20 Parkinson's disease: Secondary | ICD-10-CM | POA: Insufficient documentation

## 2016-10-14 DIAGNOSIS — M79605 Pain in left leg: Secondary | ICD-10-CM

## 2016-10-14 DIAGNOSIS — I10 Essential (primary) hypertension: Secondary | ICD-10-CM | POA: Diagnosis not present

## 2016-10-14 DIAGNOSIS — I2510 Atherosclerotic heart disease of native coronary artery without angina pectoris: Secondary | ICD-10-CM | POA: Diagnosis not present

## 2016-10-14 DIAGNOSIS — M79662 Pain in left lower leg: Secondary | ICD-10-CM | POA: Diagnosis not present

## 2016-10-14 DIAGNOSIS — J449 Chronic obstructive pulmonary disease, unspecified: Secondary | ICD-10-CM | POA: Diagnosis not present

## 2016-10-14 HISTORY — DX: Acute embolism and thrombosis of unspecified deep veins of unspecified lower extremity: I82.409

## 2016-10-14 LAB — CBC WITH DIFFERENTIAL/PLATELET
BASOS PCT: 1 %
Basophils Absolute: 0 10*3/uL (ref 0.0–0.1)
EOS ABS: 0.1 10*3/uL (ref 0.0–0.7)
Eosinophils Relative: 2 %
HCT: 37 % — ABNORMAL LOW (ref 39.0–52.0)
HEMOGLOBIN: 12.7 g/dL — AB (ref 13.0–17.0)
LYMPHS ABS: 1.9 10*3/uL (ref 0.7–4.0)
Lymphocytes Relative: 27 %
MCH: 30.5 pg (ref 26.0–34.0)
MCHC: 34.3 g/dL (ref 30.0–36.0)
MCV: 88.7 fL (ref 78.0–100.0)
Monocytes Absolute: 0.4 10*3/uL (ref 0.1–1.0)
Monocytes Relative: 6 %
NEUTROS PCT: 64 %
Neutro Abs: 4.6 10*3/uL (ref 1.7–7.7)
Platelets: 186 10*3/uL (ref 150–400)
RBC: 4.17 MIL/uL — AB (ref 4.22–5.81)
RDW: 14.1 % (ref 11.5–15.5)
WBC: 7.1 10*3/uL (ref 4.0–10.5)

## 2016-10-14 LAB — BASIC METABOLIC PANEL
ANION GAP: 9 (ref 5–15)
BUN: 11 mg/dL (ref 6–20)
CHLORIDE: 109 mmol/L (ref 101–111)
CO2: 21 mmol/L — ABNORMAL LOW (ref 22–32)
Calcium: 8.8 mg/dL — ABNORMAL LOW (ref 8.9–10.3)
Creatinine, Ser: 0.8 mg/dL (ref 0.61–1.24)
GFR calc non Af Amer: >60 mL/min (ref >60)
Glucose, Bld: 106 mg/dL — ABNORMAL HIGH (ref 65–99)
POTASSIUM: 3.5 mmol/L (ref 3.5–5.1)
SODIUM: 139 mmol/L (ref 135–145)

## 2016-10-14 MED ORDER — DOXYCYCLINE HYCLATE 100 MG PO CAPS: 100.0000 mg | ORAL_CAPSULE | Freq: Two times a day (BID) | ORAL | 0 refills | Status: DC

## 2016-10-14 MED ORDER — DOXYCYCLINE HYCLATE 100 MG PO TABS
100.0000 mg | ORAL_TABLET | Freq: Once | ORAL | Status: AC
  Administered 2016-10-14: 100 mg via ORAL
  Filled 2016-10-14: qty 1

## 2016-10-14 NOTE — Discharge Instructions (Signed)
Your swelling is likely related to an infection in your skin - if you should have increased pain, swelling or spreading redness in the next 48 hours, you MUST return to the ER immediately.  Doxycycline twice daily for 10 days.  You are already taking the blood thinner that we use for blood clots but if your swelling worsens, it will need to be emergently evaluated for possible spreading of the blood clot.   Your doctor should see you on Monday to have this repeat evaluation - you may need referral to a vascular surgeon if you continue to have swelling or worsening swelling - your doctor can make this referral.

## 2016-10-14 NOTE — ED Provider Notes (Signed)
[facility] DEPT Provider Note   CSN: [id number] Arrival date & time: 10/14/16  1806     History   Chief Complaint Chief Complaint  Patient presents with  . Leg Pain    HPI JED KUTCH is a 71 y.o. male.  HPI  The patient is a 71 year old male, he has a known history of diabetes, hypertension, thromboembolic disease including DVT and bilateral pulmonary embolisms as diagnosed proximally 6 weeks ago when he was admitted to the hospital. Prior to that he had been on Plavix, he was switched eliquis after a short inpatient stay for his blood clots. The patient has since been discharged and has been doing okay but over the last several weeks he has had increasing pain and swelling around the left ankle with some redness and warmth to the skin. He denies fevers.    Past Medical History:  Diagnosis Date  . Anxiety   . Arthritis   . Coronary artery disease   . Diabetes mellitus   . DVT (deep venous thrombosis) ([place])   . Hypercholesterolemia   . Hypertension   . PAD (peripheral artery disease) ([place])   . SOB (shortness of breath)     Patient Active Problem List   Diagnosis Date Noted  . Left leg DVT ([place]) 08/31/2016  . Malnutrition of moderate degree 08/30/2016  . Thrombocytopenia ([place]) 08/29/2016  . PE (pulmonary thromboembolism) ([place]) 08/28/2016  . Pulmonary nodule 08/28/2016  . Ureteral stone with hydronephrosis 08/28/2016  . Chest pain 08/28/2016  . CAD (coronary artery disease)   . TIA (transient ischemic attack) 11/27/2011  . Research study patient 08/30/2011  . PAD (peripheral artery disease) ([place]) 06/30/2011  . PVD (peripheral vascular disease) ([place]) 01/10/2011  . COPD (chronic obstructive pulmonary disease) ([place]) 07/06/2010  . TOBACCO ABUSE 11/12/2008  . CAD 09/08/2008  . DM type 2 (diabetes mellitus, type 2) ([place]) 08/26/2008  . HYPERCHOLESTEROLEMIA  IIA 08/26/2008  . ANXIETY 08/26/2008  . PARKINSON'S DISEASE 08/26/2008  . Essential hypertension  08/26/2008  . ARTHRITIS 08/26/2008  . SHORTNESS OF BREATH 08/26/2008    Past Surgical History:  Procedure Laterality Date  . CARDIAC CATHETERIZATION N/A 06/10/2015   Procedure: Left Heart Cath and Coronary Angiography;  Surgeon: Burnell Blanks, MD;  Location: [facility] CV LAB;  Service: Cardiovascular;  Laterality: N/A;  . LOWER EXTREMITY ANGIOGRAM N/A 07/13/2011   Procedure: LOWER EXTREMITY ANGIOGRAM;  Surgeon: Burnell Blanks, MD;  Location: [facility] CATH LAB;  Service: Cardiovascular;  Laterality: N/A;  . OTHER SURGICAL HISTORY    . OTHER SURGICAL HISTORY    . OTHER SURGICAL HISTORY    . PERCUTANEOUS STENT INTERVENTION Right 07/13/2011   Procedure: PERCUTANEOUS STENT INTERVENTION;  Surgeon: Burnell Blanks, MD;  Location: [facility] CATH LAB;  Service: Cardiovascular;  Laterality: Right;       Home Medications    Prior to Admission medications   Medication Sig Start Date End Date Taking? Authorizing Provider  apixaban (ELIQUIS) 5 MG TABS tablet Take 10mg  po bid for 7 days then 5mg  po bid Patient taking differently: 5 mg 2 (two) times daily. Take 10mg  po bid for 7 days then 5mg  po bid 08/31/16  Yes Memon, Jolaine Artist, MD  gabapentin (NEURONTIN) 300 MG capsule Take 1 capsule by mouth 3 (three) times daily. 04/19/16  Yes [provider]  glipiZIDE (GLUCOTROL XL) 5 MG 24 hr tablet Take 10 mg by mouth 2 (two) times daily.  03/11/15  Yes [provider]  levothyroxine (SYNTHROID) 25 MCG tablet  Take 1 tablet (25 mcg total) by mouth daily before breakfast. 10/07/14  Yes Burnell Blanks, MD  lisinopril (PRINIVIL,ZESTRIL) 5 MG tablet Take 5 mg by mouth daily. 05/21/15  Yes [provider]  metFORMIN (GLUCOPHAGE-XR) 500 MG 24 hr tablet Take 2 tablets by mouth 2 (two) times daily. 04/19/16  Yes [provider]  pantoprazole (PROTONIX) 40 MG tablet Take 40 mg by mouth 2 (two) times daily.  04/22/15  Yes [provider]  rOPINIRole (REQUIP) 3 MG  tablet Take 3 mg by mouth 4 (four) times daily as needed (Restless Leg).  04/10/15  Yes [provider]  rosuvastatin (CRESTOR) 20 MG tablet Take 1 tablet (20 mg total) by mouth daily. 10/07/14  Yes Burnell Blanks, MD  tiotropium (SPIRIVA) 18 MCG inhalation capsule Place 1 capsule into inhaler and inhale daily. 04/19/16  Yes [provider]  ZETIA 10 MG tablet Take 10 mg by mouth daily. 04/03/15  Yes [provider]  doxycycline (VIBRAMYCIN) 100 MG capsule Take 1 capsule (100 mg total) by mouth 2 (two) times daily. 10/14/16   Noemi Chapel, MD    Family History Family History  Problem Relation Age of Onset  . Restless legs syndrome Brother     Social History Social History  Substance Use Topics  . Smoking status: Current Every Day Smoker    Packs/day: 1.50    Types: Cigarettes  . Smokeless tobacco: Never Used  . Alcohol use No     Allergies   Aspirin   Review of Systems Review of Systems  All other systems reviewed and are negative.    Physical Exam Updated Vital Signs BP 134/67   Pulse 81   Temp 97.9 F (36.6 C) (Oral)   Resp 16   Ht 5\' 9"  (1.753 m)   Wt 66.7 kg (147 lb)   SpO2 98%   BMI 21.71 kg/m   Physical Exam  Constitutional: He appears well-developed and well-nourished. No distress.  HENT:  Head: Normocephalic and atraumatic.  Mouth/Throat: Oropharynx is clear and moist. No oropharyngeal exudate.  Eyes: Conjunctivae and EOM are normal. Pupils are equal, round, and reactive to light. Right eye exhibits no discharge. Left eye exhibits no discharge. No scleral icterus.  Neck: Normal range of motion. Neck supple. No JVD present. No thyromegaly present.  Cardiovascular: Normal rate, regular rhythm, normal heart sounds and intact distal pulses.  Exam reveals no gallop and no friction rub.   No murmur heard. Pulmonary/Chest: Effort normal and breath sounds normal. No respiratory distress. He has no wheezes. He has no rales.    Abdominal: Soft. Bowel sounds are normal. He exhibits no distension and no mass. There is no tenderness.  Musculoskeletal: Normal range of motion. He exhibits edema and tenderness.  There is a symmetrical swelling of the lower extremities, left greater than right, the swelling starts in the mid lower extremity mid shin, it involves the ankle and the foot where there is pitting edema. The pulses are palpable and normal bilaterally at the dorsalis pedis. He does have increased redness, warmth of the skin around the ankle and the foot.  Lymphadenopathy:    He has no cervical adenopathy.  Neurological: He is alert. Coordination normal.  Skin: Skin is warm and dry. Rash noted. No erythema.  Psychiatric: He has a normal mood and affect. His behavior is normal.  Nursing note and vitals reviewed.    ED Treatments / Results  Labs (all labs ordered are listed, but only abnormal results  are displayed) Labs Reviewed  CBC WITH DIFFERENTIAL/PLATELET - Abnormal; Notable for the following:       Result Value   RBC 4.17 (*)    Hemoglobin 12.7 (*)    HCT 37.0 (*)    All other components within normal limits  BASIC METABOLIC PANEL - Abnormal; Notable for the following:    CO2 21 (*)    Glucose, Bld 106 (*)    Calcium 8.8 (*)    All other components within normal limits     Radiology No results found.  Procedures Procedures (including critical care time)  Medications Ordered in ED Medications  doxycycline (VIBRA-TABS) tablet 100 mg (100 mg Oral Given 10/14/16 1846)     Initial Impression / Assessment and Plan / ED Course  I have reviewed the triage vital signs and the nursing notes.  Pertinent labs & imaging results that were available during my care of the patient were reviewed by me and considered in my medical decision making (see chart for details).     The patient is taking his anticoagulant as prescribed twice daily. At this time given the appearance of the skin I suspect that he  has a cellulitis and not thromboembolic problem however this could be a post phlebitis issue as well. If he does not improve on the doxycycline he will need follow up with the vascular surgery clinic. This information will be given to the patient.  WBC normal Pt informed of results and tx plan Needs f/u on Monday - pt in agreement No hemodynamic instability  Vitals:   10/14/16 1815 10/14/16 1823 10/14/16 1827 10/14/16 1930  BP: 128/65  135/88 134/67  Pulse:   86 81  Resp:   16 16  Temp:   97.9 F (36.6 C)   TempSrc:   Oral   SpO2:  100% 100% 98%  Weight:      Height:         Final Clinical Impressions(s) / ED Diagnoses   Final diagnoses:  Left leg pain  Cellulitis of right ankle    New Prescriptions New Prescriptions   DOXYCYCLINE (VIBRAMYCIN) 100 MG CAPSULE    Take 1 capsule (100 mg total) by mouth 2 (two) times daily.     Noemi Chapel, MD 10/14/16 2002

## 2016-10-14 NOTE — ED Triage Notes (Signed)
Left lower leg pain and redness noted.  Reports of 3 weeks per patient.  Also had DVT in left leg recently.

## 2016-10-25 ENCOUNTER — Encounter: Payer: Self-pay | Admitting: Pulmonary Disease

## 2016-10-25 ENCOUNTER — Ambulatory Visit (INDEPENDENT_AMBULATORY_CARE_PROVIDER_SITE_OTHER): Payer: Medicare HMO | Admitting: Pulmonary Disease

## 2016-10-25 VITALS — BP 130/62 | HR 80 | Ht 69.0 in | Wt 145.6 lb

## 2016-10-25 DIAGNOSIS — R911 Solitary pulmonary nodule: Secondary | ICD-10-CM | POA: Diagnosis not present

## 2016-10-25 NOTE — Progress Notes (Addendum)
Matthew Wilkins    [id number]    [birth year]  Primary Care Physician:Wilson, Jama Flavors, MD  Referring Physician: [name], MD [address] [place], [place] [address]  Chief complaint:  Consult for evaluation of lung mass  HPI: 71 year old heavy smoker, coronary artery disease, peripheral vascular disease. He was admitted to the hospital in April 2018 with chest pain and was found to have bilateral PE. He has been started on anticoagulation and is in and is currently on Eliquis. CT of the chest also showed right middle lobe lung nodule suspicious for malignancy. He had a subsequent PET scan which is elevated at uptake with hilar, mediastinal lymphadenopathy suspicious for cancer.  He has dyspnea on exertion at baseline, non productive cough. Denies any wheezing, hemoptysis, loss of weight, loss of appetite.  Outpatient Encounter Prescriptions as of 10/25/2016  Medication Sig  . apixaban (ELIQUIS) 5 MG TABS tablet Take 10mg  po bid for 7 days then 5mg  po bid (Patient taking differently: 5 mg 2 (two) times daily. Take 10mg  po bid for 7 days then 5mg  po bid)  . gabapentin (NEURONTIN) 300 MG capsule Take 1 capsule by mouth 3 (three) times daily.  [name] glipiZIDE (GLUCOTROL XL) 5 MG 24 hr tablet Take 10 mg by mouth 2 (two) times daily.   [name] levothyroxine (SYNTHROID) 25 MCG tablet Take 1 tablet (25 mcg total) by mouth daily before breakfast.  . lisinopril (PRINIVIL,ZESTRIL) 5 MG tablet Take 5 mg by mouth daily.  . metFORMIN (GLUCOPHAGE-XR) 500 MG 24 hr tablet Take 2 tablets by mouth 2 (two) times daily.  . pantoprazole (PROTONIX) 40 MG tablet Take 40 mg by mouth 2 (two) times daily.   [name] rOPINIRole (REQUIP) 3 MG tablet Take 3 mg by mouth 4 (four) times daily as needed (Restless Leg).   . rosuvastatin (CRESTOR) 20 MG tablet Take 1 tablet (20 mg total) by mouth daily.  [name] tiotropium (SPIRIVA) 18 MCG inhalation capsule Place 1 capsule into inhaler and inhale daily.  [name] ZETIA 10 MG tablet Take  10 mg by mouth daily.  . [DISCONTINUED] doxycycline (VIBRAMYCIN) 100 MG capsule Take 1 capsule (100 mg total) by mouth 2 (two) times daily.   No facility-administered encounter medications on file as of 10/25/2016.     Allergies as of 10/25/2016 - Review Complete 10/25/2016  Allergen Reaction Noted  . Aspirin Other (See Comments) 08/27/2008    Past Medical History:  Diagnosis Date  . Anxiety   . Arthritis   . Coronary artery disease   . Diabetes mellitus   . DVT (deep venous thrombosis) ([place])   . Hypercholesterolemia   . Hypertension   . PAD (peripheral artery disease) ([place])   . SOB (shortness of breath)     Past Surgical History:  Procedure Laterality Date  . CARDIAC CATHETERIZATION N/A 06/10/2015   Procedure: Left Heart Cath and Coronary Angiography;  Surgeon: Burnell Blanks, MD;  Location: [facility] CV LAB;  Service: Cardiovascular;  Laterality: N/A;  . LOWER EXTREMITY ANGIOGRAM N/A 07/13/2011   Procedure: LOWER EXTREMITY ANGIOGRAM;  Surgeon: Burnell Blanks, MD;  Location: [facility] CATH LAB;  Service: Cardiovascular;  Laterality: N/A;  . OTHER SURGICAL HISTORY    . OTHER SURGICAL HISTORY    . OTHER SURGICAL HISTORY    . PERCUTANEOUS STENT INTERVENTION Right 07/13/2011   Procedure: PERCUTANEOUS STENT INTERVENTION;  Surgeon: Burnell Blanks, MD;  Location: [facility] CATH LAB;  Service: Cardiovascular;  Laterality:  Right;    Family History  Problem Relation Age of Onset  . Family history unknown: Yes    Social History   Social History  . Marital status: Widowed    Spouse name: N/A  . Number of children: N/A  . Years of education: N/A   Occupational History  . Not on file.   Social History Main Topics  . Smoking status: Current Every Day Smoker    Packs/day: 1.00    Years: 65.00    Types: Cigarettes  . Smokeless tobacco: Never Used  . Alcohol use No  . Drug use: No  . Sexual activity: No   Other Topics Concern  . Not on file   Social History  Narrative  . No narrative on file    Review of systems: Review of Systems  Constitutional: Negative for fever and chills.  HENT: Negative.   Eyes: Negative for blurred vision.  Respiratory: as per HPI  Cardiovascular: Negative for chest pain and palpitations.  Gastrointestinal: Negative for vomiting, diarrhea, blood per rectum. Genitourinary: Negative for dysuria, urgency, frequency and hematuria.  Musculoskeletal: Negative for myalgias, back pain and joint pain.  Skin: Negative for itching and rash.  Neurological: Negative for dizziness, tremors, focal weakness, seizures and loss of consciousness.  Endo/Heme/Allergies: Negative for environmental allergies.  Psychiatric/Behavioral: Negative for depression, suicidal ideas and hallucinations.  All other systems reviewed and are negative.  Physical Exam: Blood pressure 130/62, pulse 80, height 5\' 9"  (1.753 m), weight 145 lb 9.6 oz (66 kg), SpO2 99 %. Gen:      No acute distress HEENT:  EOMI, sclera anicteric Neck:     No masses; no thyromegaly Lungs:    Clear to auscultation bilaterally; normal respiratory effort CV:         Regular rate and rhythm; no murmurs Abd:      + bowel sounds; soft, non-tender; no palpable masses, no distension Ext:    No edema; adequate peripheral perfusion Skin:      Warm and dry; no rash Neuro: alert and oriented x 3 Psych: normal mood and affect  Data Reviewed: CTA chest 08/28/16-bilateral pulmonary embolism, mild emphysematous changes, 13 mm right middle lobe pulmonary nodule, mediastinal lymphadenopathy. PET scan 09/22/16- intense uptake in the right middle lobe lung nodule, uptake in hilar, mediastinal lymph nodes. I reviewed the images personally.  Assessment:  Assessment for the positive lung nodule with mediastinal lymph nodes Highly suspicious for malignancy. He'll need a biopsy for further evaluation We discussed the risk benefits of proceeding with EBUS bronch vs CT guided biopsy. He is  adamant that he does not want anything down his throat and wants to do the CT guided biopsy. He is concerned about the risk of pneumothorax and hemorrhage but has agreed to proceed.  COPD He used to follow with Dr. Luan Pulling at [place]. We'll contact his office to see if there is any recent PFTs on record.  B/L PE, DVT Pt is on eliquis. OK to hold 48 hrs before procedure and resume anticoagulation after procedure. He will need to redo the load and maintenance with 10 mg twice daily for 7 days followed by 5 mg twice daily.   Active smoker Not interested in quitting.   Plan/Recommendations: - CT guided biopsy of lung nodule - Get PFTs from pulmonologist office.   Marshell Garfinkel MD [facility] Pager [phone] 10/25/2016, 4:56 PM  CC: [name], MD

## 2016-10-25 NOTE — Patient Instructions (Signed)
We will scheduled you for a CT-guided biopsy of lung mass  Return to clinic in 1-2 months

## 2016-10-27 ENCOUNTER — Telehealth: Payer: Self-pay

## 2016-10-27 ENCOUNTER — Other Ambulatory Visit: Payer: Self-pay

## 2016-10-27 DIAGNOSIS — Z8679 Personal history of other diseases of the circulatory system: Secondary | ICD-10-CM | POA: Diagnosis not present

## 2016-10-27 DIAGNOSIS — R69 Illness, unspecified: Secondary | ICD-10-CM | POA: Diagnosis not present

## 2016-10-27 DIAGNOSIS — R0602 Shortness of breath: Secondary | ICD-10-CM

## 2016-10-27 DIAGNOSIS — E1142 Type 2 diabetes mellitus with diabetic polyneuropathy: Secondary | ICD-10-CM | POA: Diagnosis not present

## 2016-10-27 DIAGNOSIS — J449 Chronic obstructive pulmonary disease, unspecified: Secondary | ICD-10-CM | POA: Diagnosis not present

## 2016-10-27 DIAGNOSIS — I10 Essential (primary) hypertension: Secondary | ICD-10-CM | POA: Diagnosis not present

## 2016-10-27 DIAGNOSIS — E782 Mixed hyperlipidemia: Secondary | ICD-10-CM | POA: Diagnosis not present

## 2016-10-27 DIAGNOSIS — E039 Hypothyroidism, unspecified: Secondary | ICD-10-CM | POA: Diagnosis not present

## 2016-10-27 DIAGNOSIS — E1165 Type 2 diabetes mellitus with hyperglycemia: Secondary | ICD-10-CM | POA: Diagnosis not present

## 2016-10-27 DIAGNOSIS — G2581 Restless legs syndrome: Secondary | ICD-10-CM | POA: Diagnosis not present

## 2016-10-27 DIAGNOSIS — K219 Gastro-esophageal reflux disease without esophagitis: Secondary | ICD-10-CM | POA: Diagnosis not present

## 2016-10-27 NOTE — Telephone Encounter (Signed)
Per PM schedule full PFT prior to f/u, due to Dr. Luan Pulling office not having a PFT on file.  I have spoken with pt's daughter to make her aware of this. Lowella Bandy states she will call us back to schedule, as she is currently in a loud place. PFT order placed. Please schedule PFT. Thanks.

## 2016-11-02 ENCOUNTER — Other Ambulatory Visit: Payer: Self-pay | Admitting: Radiology

## 2016-11-03 ENCOUNTER — Other Ambulatory Visit: Payer: Self-pay | Admitting: General Surgery

## 2016-11-04 ENCOUNTER — Encounter (HOSPITAL_COMMUNITY): Payer: Self-pay

## 2016-11-04 ENCOUNTER — Ambulatory Visit (HOSPITAL_COMMUNITY)
Admission: RE | Admit: 2016-11-04 | Discharge: 2016-11-04 | Disposition: A | Discharge status: Home / Self Care | Payer: Medicare HMO | Source: Ambulatory Visit | Attending: Interventional Radiology | Admitting: Interventional Radiology

## 2016-11-04 ENCOUNTER — Ambulatory Visit (HOSPITAL_COMMUNITY)
Admission: RE | Admit: 2016-11-04 | Discharge: 2016-11-04 | Disposition: A | Discharge status: Home / Self Care | Payer: Medicare HMO | Source: Ambulatory Visit | Attending: Pulmonary Disease | Admitting: Pulmonary Disease

## 2016-11-04 DIAGNOSIS — R911 Solitary pulmonary nodule: Secondary | ICD-10-CM | POA: Diagnosis not present

## 2016-11-04 DIAGNOSIS — Z7901 Long term (current) use of anticoagulants: Secondary | ICD-10-CM | POA: Diagnosis not present

## 2016-11-04 DIAGNOSIS — F17210 Nicotine dependence, cigarettes, uncomplicated: Secondary | ICD-10-CM | POA: Diagnosis not present

## 2016-11-04 DIAGNOSIS — I2510 Atherosclerotic heart disease of native coronary artery without angina pectoris: Secondary | ICD-10-CM | POA: Diagnosis not present

## 2016-11-04 DIAGNOSIS — E119 Type 2 diabetes mellitus without complications: Secondary | ICD-10-CM | POA: Diagnosis not present

## 2016-11-04 DIAGNOSIS — Z7984 Long term (current) use of oral hypoglycemic drugs: Secondary | ICD-10-CM | POA: Insufficient documentation

## 2016-11-04 DIAGNOSIS — F419 Anxiety disorder, unspecified: Secondary | ICD-10-CM | POA: Insufficient documentation

## 2016-11-04 DIAGNOSIS — E7800 Pure hypercholesterolemia, unspecified: Secondary | ICD-10-CM | POA: Insufficient documentation

## 2016-11-04 DIAGNOSIS — Z86711 Personal history of pulmonary embolism: Secondary | ICD-10-CM | POA: Diagnosis not present

## 2016-11-04 DIAGNOSIS — C3411 Malignant neoplasm of upper lobe, right bronchus or lung: Secondary | ICD-10-CM | POA: Diagnosis not present

## 2016-11-04 DIAGNOSIS — I10 Essential (primary) hypertension: Secondary | ICD-10-CM | POA: Insufficient documentation

## 2016-11-04 DIAGNOSIS — R69 Illness, unspecified: Secondary | ICD-10-CM | POA: Diagnosis not present

## 2016-11-04 DIAGNOSIS — Z79899 Other long term (current) drug therapy: Secondary | ICD-10-CM | POA: Insufficient documentation

## 2016-11-04 DIAGNOSIS — Z03818 Encounter for observation for suspected exposure to other biological agents ruled out: Secondary | ICD-10-CM | POA: Diagnosis not present

## 2016-11-04 LAB — PROTIME-INR
INR: 1.03
PROTHROMBIN TIME: 13.5 s (ref 11.4–15.2)

## 2016-11-04 LAB — CBC
HCT: 42.7 % (ref 39.0–52.0)
HEMOGLOBIN: 14.7 g/dL (ref 13.0–17.0)
MCH: 30.1 pg (ref 26.0–34.0)
MCHC: 34.4 g/dL (ref 30.0–36.0)
MCV: 87.5 fL (ref 78.0–100.0)
Platelets: 229 10*3/uL (ref 150–400)
RBC: 4.88 MIL/uL (ref 4.22–5.81)
RDW: 13.8 % (ref 11.5–15.5)
WBC: 8.3 10*3/uL (ref 4.0–10.5)

## 2016-11-04 LAB — APTT: APTT: 31 s (ref 24–36)

## 2016-11-04 LAB — GLUCOSE, CAPILLARY: Glucose-Capillary: 108 mg/dL — ABNORMAL HIGH (ref 65–99)

## 2016-11-04 MED ORDER — MIDAZOLAM HCL 2 MG/2ML IJ SOLN
INTRAMUSCULAR | Status: AC | PRN
  Administered 2016-11-04: 0.5 mg via INTRAVENOUS

## 2016-11-04 MED ORDER — LIDOCAINE HCL (PF) 1 % IJ SOLN
INTRAMUSCULAR | Status: AC
  Filled 2016-11-04: qty 30

## 2016-11-04 MED ORDER — FENTANYL CITRATE (PF) 100 MCG/2ML IJ SOLN
INTRAMUSCULAR | Status: AC | PRN
  Administered 2016-11-04: 25 ug via INTRAVENOUS

## 2016-11-04 MED ORDER — SODIUM CHLORIDE 0.9 % IV SOLN
INTRAVENOUS | Status: DC
  Administered 2016-11-04: 11:00:00 via INTRAVENOUS

## 2016-11-04 MED ORDER — FENTANYL CITRATE (PF) 100 MCG/2ML IJ SOLN
INTRAMUSCULAR | Status: AC
  Filled 2016-11-04: qty 4

## 2016-11-04 MED ORDER — MIDAZOLAM HCL 2 MG/2ML IJ SOLN
INTRAMUSCULAR | Status: AC
  Filled 2016-11-04: qty 4

## 2016-11-04 NOTE — Sedation Documentation (Signed)
Patient is resting comfortably. 

## 2016-11-04 NOTE — Procedures (Signed)
RUL NODULE  S/p CT RUL nodule 18 g core bx  Small ptx  Stable Path pending Full report in pacs

## 2016-11-04 NOTE — Sedation Documentation (Signed)
Patient denies pain and is resting comfortably.  

## 2016-11-04 NOTE — Discharge Instructions (Signed)
Needle Biopsy of the Lung, Care After °This sheet gives you information about how to care for yourself after your procedure. Your health care provider may also give you more specific instructions. If you have problems or questions, contact your health care provider. °What can I expect after the procedure? °After the procedure, it is common to have: °· Soreness, pain, and tenderness where a tissue sample was taken (biopsy site). °· A cough. °· A sore throat. ° °Follow these instructions at home: °Biopsy site care °· Follow instructions from your health care provider about when to remove the bandage that was placed on the biopsy site. °· Keep the bandage dry until it has been removed. °· Check your biopsy site every day for signs of infection. Check for: °? More redness, swelling, or pain. °? More fluid or blood. °? Warmth to the touch. °? Pus or a bad smell. °General instructions °· Rest as directed by your health care provider. Ask your health care provider what activities are safe for you. °· Do not take baths, swim, or use a hot tub until your health care provider approves. °· Take over-the-counter and prescription medicines only as told by your health care provider. °· If you have airplane travel scheduled, talk with your health care provider about when it is safe for you to travel by airplane. °· It is up to you to get the results of your procedure. Ask your health care provider, or the department that is doing the procedure, when your results will be ready. °· Keep all follow-up visits as told by your health care provider. This is important. °Contact a health care provider if: °· You have more redness, swelling, or pain around your biopsy site. °· You have more fluid or blood coming from your biopsy site. °· Your biopsy site feels warm to the touch. °· You have pus or a bad smell coming from your biopsy site. °· You have a fever. °· You have pain that does not get better with medicine. °Get help right away  if: °· You have problems breathing. °· You have chest pain. °· You cough up blood. °· You faint. °· You have a fast heart rate. °Summary °· After a needle biopsy of the lung, it is common to have a cough, a sore throat, or soreness, pain, and tenderness where a tissue sample was taken (biopsy site). °· You should check your biopsy area every day for signs of infection, including pus or a bad smell, warmth, more fluid or blood, or more redness, swelling, or pain. °· You should not take baths, swim, or use a hot tub until your health care provider approves. °· It is up to you to get the results of your procedure. Ask your health care provider, or the department that is doing the procedure, when your results will be ready. °This information is not intended to replace advice given to you by your health care provider. Make sure you discuss any questions you have with your health care provider. °Document Released: 02/27/2007 Document Revised: 03/23/2016 Document Reviewed: 03/23/2016 °Elsevier Interactive Patient Education © 2017 [employer] ° ° °

## 2016-11-04 NOTE — H&P (Signed)
Chief Complaint: Patient was seen in consultation today for right lung lesion biopsy at the request of [place]  Referring Physician(s): Mannam,Praveen  Supervising Physician: Daryll Brod  Patient Status: [facility] - Out-pt  History of Present Illness: Matthew Wilkins is a 71 y.o. male   +Smoker Was in hospital in April 2018 for B PE and now anticoagulated with Eliquis BID daily Last dose Tuesday 6/19 CT Imaging also revealed lung mass 3. Mild emphysema. There is a 12 mm pulmonary nodule in the right middle lobe PET 09/22/16: IMPRESSION: 1. Hypermetabolic (max SUV 76.2) peripheral right middle lobe solid 1.5 cm pulmonary nodule, compatible with primary bronchogenic carcinoma. 2. Hypermetabolic ipsilateral hilar and ipsilateral mediastinal lymphadenopathy . 3. No distant hypermetabolic metastatic disease. 4. PET-CT staging is IIIA (T1a N2 M0). 5. Large right renal pelvis stone with mild right hydronephrosis. 6. Additional findings include aortic atherosclerosis, three-vessel coronary atherosclerosis, mild emphysema, marked sigmoid diverticulosis and mildly enlarged prostate.  Pt now has been seen and evaluated by Dr Rolla Etienne Scheduled for right lung lesion biopsy Noted was mediastinal LAN Pt refuses bronchoscopy  Past Medical History:  Diagnosis Date  . Anxiety   . Arthritis   . Coronary artery disease   . Diabetes mellitus   . DVT (deep venous thrombosis) ([place])   . Hypercholesterolemia   . Hypertension   . PAD (peripheral artery disease) ([place])   . SOB (shortness of breath)     Past Surgical History:  Procedure Laterality Date  . CARDIAC CATHETERIZATION N/A 06/10/2015   Procedure: Left Heart Cath and Coronary Angiography;  Surgeon: Burnell Blanks, MD;  Location: [place] CV LAB;  Service: Cardiovascular;  Laterality: N/A;  . LOWER EXTREMITY ANGIOGRAM N/A 07/13/2011   Procedure: LOWER EXTREMITY ANGIOGRAM;  Surgeon: Burnell Blanks, MD;   Location: [facility] CATH LAB;  Service: Cardiovascular;  Laterality: N/A;  . OTHER SURGICAL HISTORY    . OTHER SURGICAL HISTORY    . OTHER SURGICAL HISTORY    . PERCUTANEOUS STENT INTERVENTION Right 07/13/2011   Procedure: PERCUTANEOUS STENT INTERVENTION;  Surgeon: Burnell Blanks, MD;  Location: [facility] CATH LAB;  Service: Cardiovascular;  Laterality: Right;    Allergies: Aspirin  Medications: Prior to Admission medications   Medication Sig Start Date End Date Taking? Authorizing Provider  apixaban (ELIQUIS) 5 MG TABS tablet Take 10mg  po bid for 7 days then 5mg  po bid Patient taking differently: 5 mg 2 (two) times daily. Take 10mg  po bid for 7 days then 5mg  po bid 08/31/16  Yes Memon, Jolaine Artist, MD  gabapentin (NEURONTIN) 300 MG capsule Take 1 capsule by mouth 3 (three) times daily. 04/19/16  Yes [provider]  glipiZIDE (GLUCOTROL XL) 5 MG 24 hr tablet Take 10 mg by mouth 2 (two) times daily.  03/11/15  Yes [provider]  levothyroxine (SYNTHROID) 25 MCG tablet Take 1 tablet (25 mcg total) by mouth daily before breakfast. 10/07/14  Yes Burnell Blanks, MD  lisinopril (PRINIVIL,ZESTRIL) 5 MG tablet Take 5 mg by mouth daily. 05/21/15  Yes [provider]  metFORMIN (GLUCOPHAGE-XR) 500 MG 24 hr tablet Take 2 tablets by mouth 2 (two) times daily. 04/19/16  Yes [provider]  pantoprazole (PROTONIX) 40 MG tablet Take 40 mg by mouth 2 (two) times daily.  04/22/15  Yes [provider]  rOPINIRole (REQUIP) 3 MG tablet Take 3 mg by mouth 4 (four) times daily as needed (Restless Leg).  04/10/15  Yes [provider]  rosuvastatin (CRESTOR) 20  MG tablet Take 1 tablet (20 mg total) by mouth daily. 10/07/14  Yes Burnell Blanks, MD  tiotropium (SPIRIVA) 18 MCG inhalation capsule Place 1 capsule into inhaler and inhale daily. 04/19/16  Yes [provider]  ZETIA 10 MG tablet Take 10 mg by mouth daily. 04/03/15  Yes [provider]     Family History  Problem Relation Age of Onset  . Family history unknown: Yes    Social History   Social History  . Marital status: Widowed    Spouse name: N/A  . Number of children: N/A  . Years of education: N/A   Social History Main Topics  . Smoking status: Current Every Day Smoker    Packs/day: 1.00    Years: 65.00    Types: Cigarettes  . Smokeless tobacco: Never Used  . Alcohol use No  . Drug use: No  . Sexual activity: No   Other Topics Concern  . None   Social History Narrative  . None    Review of Systems: A 12 point ROS discussed and pertinent positives are indicated in the HPI above.  All other systems are negative.  Review of Systems  Constitutional: Negative for activity change, appetite change, fatigue and fever.  HENT: Positive for hearing loss.   Respiratory: Negative for cough and shortness of breath.   Cardiovascular: Negative for chest pain.  Neurological: Negative for syncope.  Psychiatric/Behavioral: Negative for behavioral problems and confusion.    Vital Signs: BP 128/80   Pulse 68   Temp 97.6 F (36.4 C)   Ht 5\' 8"  (1.727 m)   Wt 145 lb (65.8 kg)   SpO2 100%   BMI 22.05 kg/m   Physical Exam  Constitutional: He is oriented to person, place, and time.  Cardiovascular: Normal rate, regular rhythm and normal heart sounds.   Pulmonary/Chest: Effort normal and breath sounds normal. He has no wheezes.  Abdominal: Soft. Bowel sounds are normal.  Musculoskeletal: Normal range of motion.  Neurological: He is alert and oriented to person, place, and time.  Skin: Skin is warm and dry.  Psychiatric: He has a normal mood and affect. His behavior is normal. Judgment and thought content normal.  Nursing note and vitals reviewed.   Mallampati Score:  MD Evaluation Airway: WNL Heart: WNL Abdomen: WNL Chest/ Lungs: WNL ASA  Classification: 3 Mallampati/Airway Score: One  Imaging: No results found.  Labs:  CBC:  Recent  Labs  08/30/16 0425 08/31/16 0416 10/14/16 1859 11/04/16 0837  WBC 4.5 6.9 7.1 8.3  HGB 13.4 13.6 12.7* 14.7  HCT 38.6* 39.3 37.0* 42.7  PLT 154 191 186 229    COAGS:  Recent Labs  08/28/16 2055 08/29/16 0404 11/04/16 0837  INR 1.32 1.09 1.03  APTT  --   --  31    BMP:  Recent Labs  08/28/16 1658 08/29/16 0404 08/30/16 0425 10/14/16 1859  NA 131* 132* 134* 139  K 4.0 3.7 4.2 3.5  CL 97* 96* 103 109  CO2 22 26 25  21*  GLUCOSE 221* 264* 189* 106*  BUN 21* 16 12 11   CALCIUM 9.1 8.5* 8.6* 8.8*  CREATININE 0.99 1.02 0.80 0.80  GFRNONAA >60 >60 >60 >60  GFRAA >60 >60 >60 >60    LIVER FUNCTION TESTS:  Recent Labs  08/28/16 1658  BILITOT 1.1  AST 22  ALT 18  ALKPHOS 71  PROT 7.0  ALBUMIN 3.8    TUMOR MARKERS: No results for input(s): AFPTM, CEA, CA199, CHROMGRNA  in the last 8760 hours.  Assessment and Plan:  Hx B PE 08/2016 On Eliquis (last dose 6/19) ++ smoker Rt lung mass with mediastinal LAN Refuses bronchoscopy Now scheduled for right lung lesion biopsy Risks and Benefits discussed with the patient including, but not limited to bleeding, hemoptysis, respiratory failure requiring intubation, infection, pneumothorax requiring chest tube placement, stroke from air embolism or even death. All of the patient's questions were answered, patient is agreeable to proceed. Consent signed and in chart.  Thank you for this interesting consult.  I greatly enjoyed meeting MARKES SHATSWELL and look forward to participating in their care.  A copy of this report was sent to the requesting provider on this date.  Electronically Signed: Lavonia Drafts, PA-C 11/04/2016, 10:10 AM   I spent a total of    30 minutes in face to face in clinical consultation, greater than 50% of which was counseling/coordinating care for right lung lesion biopsy

## 2016-11-04 NOTE — Sedation Documentation (Signed)
Vital signs stable. 

## 2016-11-08 ENCOUNTER — Other Ambulatory Visit: Payer: Self-pay

## 2016-11-08 DIAGNOSIS — C801 Malignant (primary) neoplasm, unspecified: Secondary | ICD-10-CM

## 2016-11-10 NOTE — Telephone Encounter (Signed)
I have called and lmomtcb x 2 for [name] to call back to set up the PFT for the pt.  Will need this scheduled prior to his appt on 7/19.

## 2016-11-14 ENCOUNTER — Encounter: Payer: Self-pay | Admitting: *Deleted

## 2016-11-14 ENCOUNTER — Telehealth: Payer: Self-pay | Admitting: *Deleted

## 2016-11-14 DIAGNOSIS — R911 Solitary pulmonary nodule: Secondary | ICD-10-CM

## 2016-11-14 NOTE — Telephone Encounter (Signed)
Oncology Nurse Navigator Documentation  Oncology Nurse Navigator Flowsheets 11/14/2016  Navigator Location [facility]  Navigator Encounter Type Telephone/Matthew Wilkins called me back.  I gave him an appt to be seen this week.  He states he will call me back to see if his daughter will be able to bring him.    Telephone Incoming Call  Treatment Phase Pre-Tx/Tx Discussion  Barriers/Navigation Needs Coordination of Care  Interventions Coordination of Care  Coordination of Care Appts  Acuity Level 1  Time Spent with Patient 15

## 2016-11-14 NOTE — Progress Notes (Signed)
[place] mailed

## 2016-11-14 NOTE — Telephone Encounter (Signed)
Oncology Nurse Navigator Documentation  Oncology Nurse Navigator Flowsheets 11/14/2016  Navigator Location [facility]  Referral date to RadOnc/MedOnc 11/14/2016  Navigator Encounter Type Telephone/I received referral on Matthew Wilkins.  I called to set up an appt.  I was unable to reach.  I left vm message with my name and phone number to call.   Telephone Outgoing Call  Treatment Phase Pre-Tx/Tx Discussion  Barriers/Navigation Needs Coordination of Care  Interventions Coordination of Care  Coordination of Care Appts  Acuity Level 1  Time Spent with Patient 15

## 2016-11-14 NOTE — Progress Notes (Signed)
Patient called me back today.  He will be at appt on 11/17/16 arriving at 12:30.  He verbalized understanding of appt time and place.

## 2016-11-17 ENCOUNTER — Encounter: Payer: Self-pay | Admitting: Internal Medicine

## 2016-11-17 ENCOUNTER — Telehealth: Payer: Self-pay | Admitting: Internal Medicine

## 2016-11-17 ENCOUNTER — Other Ambulatory Visit: Payer: Self-pay | Admitting: Internal Medicine

## 2016-11-17 ENCOUNTER — Other Ambulatory Visit (HOSPITAL_BASED_OUTPATIENT_CLINIC_OR_DEPARTMENT_OTHER): Payer: Medicare HMO

## 2016-11-17 ENCOUNTER — Ambulatory Visit (HOSPITAL_BASED_OUTPATIENT_CLINIC_OR_DEPARTMENT_OTHER): Payer: Medicare HMO | Admitting: Internal Medicine

## 2016-11-17 ENCOUNTER — Ambulatory Visit: Payer: Medicare HMO | Attending: Internal Medicine | Admitting: Physical Therapy

## 2016-11-17 ENCOUNTER — Ambulatory Visit
Admission: RE | Admit: 2016-11-17 | Discharge: 2016-11-17 | Disposition: A | Discharge status: Home / Self Care | Payer: Medicare HMO | Source: Ambulatory Visit | Attending: Radiation Oncology | Admitting: Radiation Oncology

## 2016-11-17 DIAGNOSIS — Z716 Tobacco abuse counseling: Secondary | ICD-10-CM

## 2016-11-17 DIAGNOSIS — C342 Malignant neoplasm of middle lobe, bronchus or lung: Secondary | ICD-10-CM

## 2016-11-17 DIAGNOSIS — E1151 Type 2 diabetes mellitus with diabetic peripheral angiopathy without gangrene: Secondary | ICD-10-CM | POA: Insufficient documentation

## 2016-11-17 DIAGNOSIS — R2689 Other abnormalities of gait and mobility: Secondary | ICD-10-CM | POA: Insufficient documentation

## 2016-11-17 DIAGNOSIS — I10 Essential (primary) hypertension: Secondary | ICD-10-CM | POA: Insufficient documentation

## 2016-11-17 DIAGNOSIS — R293 Abnormal posture: Secondary | ICD-10-CM

## 2016-11-17 DIAGNOSIS — E7800 Pure hypercholesterolemia, unspecified: Secondary | ICD-10-CM | POA: Insufficient documentation

## 2016-11-17 DIAGNOSIS — Z9889 Other specified postprocedural states: Secondary | ICD-10-CM | POA: Insufficient documentation

## 2016-11-17 DIAGNOSIS — R911 Solitary pulmonary nodule: Secondary | ICD-10-CM

## 2016-11-17 DIAGNOSIS — Z79899 Other long term (current) drug therapy: Secondary | ICD-10-CM | POA: Insufficient documentation

## 2016-11-17 DIAGNOSIS — Z7189 Other specified counseling: Secondary | ICD-10-CM | POA: Insufficient documentation

## 2016-11-17 DIAGNOSIS — C3491 Malignant neoplasm of unspecified part of right bronchus or lung: Secondary | ICD-10-CM

## 2016-11-17 DIAGNOSIS — Z7901 Long term (current) use of anticoagulants: Secondary | ICD-10-CM | POA: Insufficient documentation

## 2016-11-17 DIAGNOSIS — I2510 Atherosclerotic heart disease of native coronary artery without angina pectoris: Secondary | ICD-10-CM | POA: Insufficient documentation

## 2016-11-17 DIAGNOSIS — R079 Chest pain, unspecified: Secondary | ICD-10-CM | POA: Insufficient documentation

## 2016-11-17 DIAGNOSIS — Z7984 Long term (current) use of oral hypoglycemic drugs: Secondary | ICD-10-CM | POA: Insufficient documentation

## 2016-11-17 DIAGNOSIS — F17210 Nicotine dependence, cigarettes, uncomplicated: Secondary | ICD-10-CM | POA: Insufficient documentation

## 2016-11-17 DIAGNOSIS — F419 Anxiety disorder, unspecified: Secondary | ICD-10-CM | POA: Insufficient documentation

## 2016-11-17 DIAGNOSIS — C3411 Malignant neoplasm of upper lobe, right bronchus or lung: Secondary | ICD-10-CM | POA: Insufficient documentation

## 2016-11-17 DIAGNOSIS — Z510 Encounter for antineoplastic radiation therapy: Secondary | ICD-10-CM | POA: Insufficient documentation

## 2016-11-17 DIAGNOSIS — Z5111 Encounter for antineoplastic chemotherapy: Secondary | ICD-10-CM

## 2016-11-17 DIAGNOSIS — R0602 Shortness of breath: Secondary | ICD-10-CM

## 2016-11-17 DIAGNOSIS — R69 Illness, unspecified: Secondary | ICD-10-CM | POA: Diagnosis not present

## 2016-11-17 HISTORY — DX: Encounter for antineoplastic chemotherapy: Z51.11

## 2016-11-17 HISTORY — DX: Malignant neoplasm of unspecified part of right bronchus or lung: C34.91

## 2016-11-17 LAB — CBC WITH DIFFERENTIAL/PLATELET
BASO%: 0.8 % (ref 0.0–2.0)
BASOS ABS: 0.1 10*3/uL (ref 0.0–0.1)
EOS%: 2.2 % (ref 0.0–7.0)
Eosinophils Absolute: 0.2 10*3/uL (ref 0.0–0.5)
HEMATOCRIT: 41.1 % (ref 38.4–49.9)
HEMOGLOBIN: 14 g/dL (ref 13.0–17.1)
LYMPH#: 3.1 10*3/uL (ref 0.9–3.3)
LYMPH%: 34.6 % (ref 14.0–49.0)
MCH: 29.9 pg (ref 27.2–33.4)
MCHC: 34.1 g/dL (ref 32.0–36.0)
MCV: 87.8 fL (ref 79.3–98.0)
MONO#: 0.5 10*3/uL (ref 0.1–0.9)
MONO%: 5.3 % (ref 0.0–14.0)
NEUT#: 5.1 10*3/uL (ref 1.5–6.5)
NEUT%: 57.1 % (ref 39.0–75.0)
PLATELETS: 214 10*3/uL (ref 140–400)
RBC: 4.68 10*6/uL (ref 4.20–5.82)
RDW: 14 % (ref 11.0–14.6)
WBC: 9 10*3/uL (ref 4.0–10.3)

## 2016-11-17 LAB — COMPREHENSIVE METABOLIC PANEL
ALT: 9 U/L (ref 0–55)
AST: 14 U/L (ref 5–34)
Albumin: 3.9 g/dL (ref 3.5–5.0)
Alkaline Phosphatase: 82 U/L (ref 40–150)
Anion Gap: 9 mEq/L (ref 3–11)
BUN: 18.4 mg/dL (ref 7.0–26.0)
CALCIUM: 10 mg/dL (ref 8.4–10.4)
CO2: 23 mEq/L (ref 22–29)
CREATININE: 1.1 mg/dL (ref 0.7–1.3)
Chloride: 108 mEq/L (ref 98–109)
EGFR: 64 mL/min/{1.73_m2} — ABNORMAL LOW (ref >90)
Glucose: 103 mg/dl (ref 70–140)
Potassium: 4 mEq/L (ref 3.5–5.1)
Sodium: 140 mEq/L (ref 136–145)
Total Bilirubin: 0.35 mg/dL (ref 0.20–1.20)
Total Protein: 7 g/dL (ref 6.4–8.3)

## 2016-11-17 MED ORDER — PROCHLORPERAZINE MALEATE 10 MG PO TABS: 10.0000 mg | ORAL_TABLET | Freq: Four times a day (QID) | ORAL | 0 refills | Status: DC | PRN

## 2016-11-17 NOTE — Progress Notes (Signed)
Radiation Oncology         [phone] ________________________________  Initial Outpatient Consultation  Name: Matthew Wilkins MRN: [id number]  Date: 11/17/2016  DOB: [birth year]  CC:[name], MD  Marshell Garfinkel, MD   REFERRING PHYSICIAN: Marshell Garfinkel, MD  DIAGNOSIS: The encounter diagnosis was Adenocarcinoma of right lung, stage 3 ([place]).  HISTORY OF PRESENT ILLNESS::Matthew Wilkins is a 71 y.o. male who is  being seen for a mass in the peripheral right middle lobe of his lungs. He is a heavy smoker with CAD and peripheral vascular disease. He was admitted to the hospital in April 2018 with chest pain and was found to have bilateral PE. He has been started on anticoagulation and is currently on Eliquis. He had CT of the chest, which also showed right middle lobe lung nodule suspicious for malignancy. He had a PET scan on 09/22/2016 which is elevated at uptake with hilar, mediastinal lymphadenopathy suspicious for cancer and the PET-CT staging is IIIA (T1a N2 M0).  On 11/04/2016 the patient underwent CT-guided biopsy of the right upper lobe pulmonary nodule which revealed poor differentiated non-small cell lung cancer with staining consistent with adenocarcinoma. Patient is referred to the multidisciplinary thoracic oncology clinic out of the courtesy of Dr. Vaughan Browner.  Patient presents today with his daughter.  PREVIOUS RADIATION THERAPY: No  PAST MEDICAL HISTORY:  has a past medical history of Adenocarcinoma of right lung, stage 3 ([place]) (11/17/2016); Anxiety; Arthritis; Coronary artery disease; Diabetes mellitus; DVT (deep venous thrombosis) ([place]); Hypercholesterolemia; Hypertension; PAD (peripheral artery disease) ([place]); and SOB (shortness of breath).    PAST SURGICAL HISTORY: Past Surgical History:  Procedure Laterality Date  . CARDIAC CATHETERIZATION N/A 06/10/2015   Procedure: Left Heart Cath and Coronary Angiography;  Surgeon: Burnell Blanks, MD;  Location: [place] CV LAB;  Service: Cardiovascular;  Laterality: N/A;  . LOWER EXTREMITY ANGIOGRAM N/A 07/13/2011   Procedure: LOWER EXTREMITY ANGIOGRAM;  Surgeon: Burnell Blanks, MD;  Location: [facility] CATH LAB;  Service: Cardiovascular;  Laterality: N/A;  . OTHER SURGICAL HISTORY    . OTHER SURGICAL HISTORY    . OTHER SURGICAL HISTORY    . PERCUTANEOUS STENT INTERVENTION Right 07/13/2011   Procedure: PERCUTANEOUS STENT INTERVENTION;  Surgeon: Burnell Blanks, MD;  Location: [facility] CATH LAB;  Service: Cardiovascular;  Laterality: Right;    FAMILY HISTORY: Family history is unknown by patient.  SOCIAL HISTORY:  reports that he has been smoking Cigarettes.  He has a 65.00 pack-year smoking history. He has never used smokeless tobacco. He reports that he does not drink alcohol or use drugs.  ALLERGIES: Aspirin  MEDICATIONS:  Current Outpatient Prescriptions  Medication Sig Dispense Refill  . apixaban (ELIQUIS) 5 MG TABS tablet Take 10mg  po bid for 7 days then 5mg  po bid (Patient taking differently: 5 mg 2 (two) times daily. Take 10mg  po bid for 7 days then 5mg  po bid) 60 tablet 1  . gabapentin (NEURONTIN) 300 MG capsule Take 1 capsule by mouth 3 (three) times daily.    [name] glipiZIDE (GLUCOTROL XL) 5 MG 24 hr tablet Take 10 mg by mouth 2 (two) times daily.     [name] levothyroxine (SYNTHROID) 25 MCG tablet Take 1 tablet (25 mcg total) by mouth daily before breakfast. 30 tablet 11  . lisinopril (PRINIVIL,ZESTRIL) 5 MG tablet Take 5 mg by mouth daily.    . metFORMIN (GLUCOPHAGE-XR) 500 MG 24 hr tablet Take 2 tablets by mouth 2 (two) times daily.    [name]  pantoprazole (PROTONIX) 40 MG tablet Take 40 mg by mouth 2 (two) times daily.     . prochlorperazine (COMPAZINE) 10 MG tablet Take 1 tablet (10 mg total) by mouth every 6 (six) hours as needed for nausea or vomiting. 30 tablet 0  . rOPINIRole (REQUIP) 3 MG tablet Take 3 mg by mouth 4 (four) times daily as needed (Restless Leg).     . rosuvastatin (CRESTOR) 20  MG tablet Take 1 tablet (20 mg total) by mouth daily. 30 tablet 11  . tiotropium (SPIRIVA) 18 MCG inhalation capsule Place 1 capsule into inhaler and inhale daily.    [name] ZETIA 10 MG tablet Take 10 mg by mouth daily.     No current facility-administered medications for this encounter.     REVIEW OF SYSTEMS:  REVIEW OF SYSTEMS: A 10+ POINT REVIEW OF SYSTEMS WAS OBTAINED including neurology, dermatology, psychiatry, cardiac, respiratory, lymph, extremities, GI, GU, musculoskeletal, constitutional, reproductive, HEENT. All pertinent positives are noted in the HPI. All others are negative.     PHYSICAL EXAM:   Growth Chart   11/17/16 10/25/16 06/04/15  BP 137/62 130/62 126/60  Pulse Rate 60 80 84  Resp 19 -- --  Temp 97.6 F (36.4 C) -- --  Temp Source Oral -- --  SpO2 100 % 99 % 98 %  Weight 148 lb (67.1 kg) 145 lb 9.6 oz (66 kg) 165 lb (74.8 kg)  Height 5\' 8"  (1.727 m) 5\' 9"  (1.753 m) 5\' 9"  (1.753 m)    General: Alert and oriented, in no acute distress HEENT: Head is normocephalic. Extraocular movements are intact. Oropharynx is clear. Neck: Neck is supple, no palpable cervical or supraclavicular lymphadenopathy. Heart: Regular in rate and rhythm with no murmurs, rubs, or gallops. Chest: Clear to auscultation bilaterally, with no rhonchi, wheezes, or rales. Abdomen: Soft, nontender, nondistended, with no rigidity or guarding. Extremities: No cyanosis or edema. Lymphatics: see Neck Exam Skin: No concerning lesions. Musculoskeletal: symmetric strength and muscle tone throughout. Neurologic: Cranial nerves II through XII are grossly intact. No obvious focalities. Speech is fluent. Coordination is intact. Left lower extremity weakness  Psychiatric: Judgment and insight are intact. Affect is appropriate.  ECOG = 1  LABORATORY DATA:  Lab Results  Component Value Date   WBC 9.0 11/17/2016   HGB 14.0 11/17/2016   HCT 41.1 11/17/2016   MCV 87.8 11/17/2016   PLT 214 11/17/2016    NEUTROABS 5.1 11/17/2016   Lab Results  Component Value Date   NA 140 11/17/2016   K 4.0 11/17/2016   CL 109 10/14/2016   CO2 23 11/17/2016   GLUCOSE 103 11/17/2016   CREATININE 1.1 11/17/2016   CALCIUM 10.0 11/17/2016      RADIOGRAPHY: Ct Biopsy  Result Date: 11/04/2016 INDICATION: PET positive peripheral right upper lobe nodule EXAM: CT-GUIDED BIOPSY RIGHT UPPER LOBE NODULE MEDICATIONS: 1% LIDOCAINE LOCALLY ANESTHESIA/SEDATION: 0.5 mg IV Versed; 100 mcg IV Fentanyl Moderate Sedation Time:  19 minutes The patient was continuously monitored during the procedure by the interventional radiology nurse under my direct supervision. PROCEDURE: The procedure, risks, benefits, and alternatives were explained to the patient. Questions regarding the procedure were encouraged and answered. The patient understands and consents to the procedure. The right axillary region was prepped with ChloraPrep in a sterile fashion, and a sterile drape was applied covering the operative field. A sterile gown and sterile gloves were used for the procedure. Previous imaging reviewed. Patient positioned slightly right anterior oblique. Noncontrast localization CT performed. The peripheral  right upper lobe pulmonary nodule was localized and correlated with the PET-CT. Overlying skin marked. Under CT guidance, a(n) 17 gauge guide needle was advanced into the peripheral right upper lobe nodule. 2 18 gauge core biopsies were obtained. The guide needle was removed following insertion of the biosentry occlusion device. Final imaging was performed. Patient tolerated the procedure well without complication. Vital sign monitoring by nursing staff during the procedure will continue as patient is in the special procedures unit for post procedure observation. FINDINGS: The images document guide needle placement within the peripheral right upper lobe nodule. Post biopsy images demonstrate small pneumothorax. COMPLICATIONS: SIR Level A - No  therapy, no consequence. IMPRESSION: Successful CT-guided core biopsy of the peripheral right upper lobe nodule Electronically Signed   By: Jerilynn Mages.  Shick M.D.   On: 11/04/2016 13:44   Dg Chest Port 1 View  Result Date: 11/04/2016 CLINICAL DATA:  Status post right upper lobe lung biopsy EXAM: PORTABLE CHEST 1 VIEW COMPARISON:  11/04/2016 FINDINGS: Cardiac shadow is within normal limits. The lungs are well aerated bilaterally. The known right upper lobe nodule is not well appreciated on this exam likely related to its size. A tiny right apical pneumothorax is identified not significantly changed from the intraprocedural images. IMPRESSION: Tiny residual right pneumothorax. No significant change from the prior intra procedural films is noted These findings were communicated to Dr. Annamaria Boots at the time of exam interpretation. Electronically Signed   By: Inez Catalina M.D.   On: 11/04/2016 14:46      IMPRESSION:  IIIA (T1a N2 M0) non-small cell lung cancer presenting in the right lung. The patient would be a good candidate for definitive course of radiation therapy along with radiosensitizing chemotherapy. I discussed the course of treatment side effects and potential toxicities of radiation therapy in this situation with the patient and his daughter. He appears to understand and wishes to proceed with planned course of treatment.   PLAN: The patient is scheduled for a planning session on 11/21/2016 AT 1:00pm and he is scheduled to have a brain MRI TO COMPLETE THE STAGING WORKUP. A consent form was obtained.      ------------------------------------------------  Blair Promise, PhD, MD   This document serves as a record of services personally performed by Gery Pray, MD. It was created on his behalf by Valeta Harms, a trained medical scribe. The creation of this record is based on the scribe's personal observations and the provider's statements to them. This document has been checked and approved by the  attending provider.

## 2016-11-17 NOTE — Progress Notes (Signed)
[place] [phone]   [phone] Multidisciplinary thoracic oncology clinic  CONSULT NOTE  REFERRING PHYSICIAN: Dr. Marshell Garfinkel.  REASON FOR CONSULTATION:  71 years old white male recently diagnosed with lung cancer.  HPI Matthew Wilkins is a 71 y.o. male with past medical history significant for hypertension, diabetes mellitus, coronary artery disease, anxiety, peripheral vascular disease, dyslipidemia, TIA and migraine. The patient was also recently diagnosed with deep venous thrombosis of the left lower extremity and bilateral pulmonary embolism. The patient mentioned that in early April 2018 he has been complaining of increasing fatigue and weakness as well as weight loss, abdominal pain and headache. He was seen at the emergency Department at [facility] and CT scan of the head without contrast performed at that time was unremarkable. He also had CT angiogram of the chest as well as CT scan of the abdomen and pelvis performed on 08/28/2016 and it showed bilateral acute pulmonary embolus involving the distal main pulmonary arteries with thrombus present in the bilateral upper, bilateral lower and right middle lobe arterial branches with CT evidence of right heart strain consistent with at least submassive pulmonary embolism. The scan also showed 1.2 cm pulmonary nodule in the right middle lobe. Ultrasound Doppler of the lower extremities showed nonocclusive thrombus in the left common femoral and left profunda femoris veins and occlusive thrombus in the left femoral vein. The patient was started on heparin and switch to Eliquis after discharge. He was referred to Dr. Vaughan Browner. A PET scan performed on 09/22/2016 showed hypermetabolic solid 1.5 x 1.3 cm peripheral right middle lobe pulmonary nodule with maximum SUV of 13.2. There was to hypermetabolic right hilar lymph nodes with SUV max of 98.9 and hypermetabolic 0.7 cm right prevascular  mediastinal lymph node with SUV max of 10.7. There was no distant hypermetabolic disease. On 11/04/2016 the patient underwent CT-guided core biopsy of the right middle lobe lung mass by interventional radiology. The final pathology [id number]) showed poorly differentiated non-small cell carcinoma. The immunohistochemistry stain showed the tumor is strongly positive for TTF-1 and negative for cytokeratin 5/6 consistent with primary lung adenocarcinoma. Dr. Vaughan Browner kindly referred the patient to the multidisciplinary thoracic oncology clinic today for evaluation and discussion of his treatment options. When seen today the patient is feeling fine with no specific complaints except for dry cough. He denied having any chest pain, shortness breath or hemoptysis. He denied having any weight loss or night sweats. He has no nausea, vomiting, diarrhea or constipation. He has no headache or visual changes. His family history is unknown since the patient grew up in a foster house.  He is a widower and has 3 children. He was accompanied today by his daughter Matthew Wilkins. He used to work as an Clinical biochemist. He has a history of smoking 1.5 pack per day for around 65 years and unfortunately continues to smoke. He has no history of alcohol or drug abuse.  HPI  Past Medical History:  Diagnosis Date  . Anxiety   . Arthritis   . Coronary artery disease   . Diabetes mellitus   . DVT (deep venous thrombosis) ([place])   . Hypercholesterolemia   . Hypertension   . PAD (peripheral artery disease) ([place])   . SOB (shortness of breath)     Past Surgical History:  Procedure Laterality Date  . CARDIAC CATHETERIZATION N/A 06/10/2015   Procedure: Left Heart Cath and Coronary Angiography;  Surgeon: Burnell Blanks, MD;  Location: [facility];  Service: Cardiovascular;  Laterality: N/A;  . LOWER EXTREMITY ANGIOGRAM N/A 07/13/2011   Procedure: LOWER EXTREMITY ANGIOGRAM;  Surgeon: Burnell Blanks, MD;  Location:  [facility] CATH LAB;  Service: Cardiovascular;  Laterality: N/A;  . OTHER SURGICAL HISTORY    . OTHER SURGICAL HISTORY    . OTHER SURGICAL HISTORY    . PERCUTANEOUS STENT INTERVENTION Right 07/13/2011   Procedure: PERCUTANEOUS STENT INTERVENTION;  Surgeon: Burnell Blanks, MD;  Location: [facility] CATH LAB;  Service: Cardiovascular;  Laterality: Right;    Family History  Problem Relation Age of Onset  . Family history unknown: Yes    Social History Social History  Substance Use Topics  . Smoking status: Current Every Day Smoker    Packs/day: 1.00    Years: 65.00    Types: Cigarettes  . Smokeless tobacco: Never Used  . Alcohol use No    Allergies  Allergen Reactions  . Aspirin Other (See Comments)    Nose bleeds     Current Outpatient Prescriptions  Medication Sig Dispense Refill  . apixaban (ELIQUIS) 5 MG TABS tablet Take 10mg  po bid for 7 days then 5mg  po bid (Patient taking differently: 5 mg 2 (two) times daily. Take 10mg  po bid for 7 days then 5mg  po bid) 60 tablet 1  . gabapentin (NEURONTIN) 300 MG capsule Take 1 capsule by mouth 3 (three) times daily.    [name] glipiZIDE (GLUCOTROL XL) 5 MG 24 hr tablet Take 10 mg by mouth 2 (two) times daily.     [name] levothyroxine (SYNTHROID) 25 MCG tablet Take 1 tablet (25 mcg total) by mouth daily before breakfast. 30 tablet 11  . lisinopril (PRINIVIL,ZESTRIL) 5 MG tablet Take 5 mg by mouth daily.    . metFORMIN (GLUCOPHAGE-XR) 500 MG 24 hr tablet Take 2 tablets by mouth 2 (two) times daily.    . pantoprazole (PROTONIX) 40 MG tablet Take 40 mg by mouth 2 (two) times daily.     [name] rOPINIRole (REQUIP) 3 MG tablet Take 3 mg by mouth 4 (four) times daily as needed (Restless Leg).     . rosuvastatin (CRESTOR) 20 MG tablet Take 1 tablet (20 mg total) by mouth daily. 30 tablet 11  . tiotropium (SPIRIVA) 18 MCG inhalation capsule Place 1 capsule into inhaler and inhale daily.    [name] ZETIA 10 MG tablet Take 10 mg by mouth daily.     No current  facility-administered medications for this visit.     Review of Systems  Constitutional: positive for fatigue Eyes: negative Ears, nose, mouth, throat, and face: positive for hearing loss Respiratory: positive for cough Cardiovascular: negative Gastrointestinal: negative Genitourinary:negative Integument/breast: negative Hematologic/lymphatic: negative Musculoskeletal:negative Neurological: negative Behavioral/Psych: negative Endocrine: negative Allergic/Immunologic: negative  Physical Exam  QIO:NGEXB, healthy, no distress, well nourished and well developed SKIN: skin color, texture, turgor are normal, no rashes or significant lesions HEAD: Normocephalic, No masses, lesions, tenderness or abnormalities EYES: normal, PERRLA, Conjunctiva are pink and non-injected EARS: External ears normal, Canals clear OROPHARYNX:no exudate, no erythema and lips, buccal mucosa, and tongue normal  NECK: supple, no adenopathy, no JVD LYMPH:  no palpable lymphadenopathy, no hepatosplenomegaly LUNGS: clear to auscultation , and palpation HEART: regular rate & rhythm, no murmurs and no gallops ABDOMEN:abdomen soft, non-tender, normal bowel sounds and no masses or organomegaly BACK: Back symmetric, no curvature., No CVA tenderness EXTREMITIES:no joint deformities, effusion, or inflammation, no edema, no skin discoloration  NEURO: alert & oriented x 3 with fluent speech, no focal motor/sensory  deficits  PERFORMANCE STATUS: ECOG 1  LABORATORY DATA: Lab Results  Component Value Date   WBC 9.0 11/17/2016   HGB 14.0 11/17/2016   HCT 41.1 11/17/2016   MCV 87.8 11/17/2016   PLT 214 11/17/2016      Chemistry      Component Value Date/Time   NA 140 11/17/2016 1151   K 4.0 11/17/2016 1151   CL 109 10/14/2016 1859   CO2 23 11/17/2016 1151   BUN 18.4 11/17/2016 1151   CREATININE 1.1 11/17/2016 1151      Component Value Date/Time   CALCIUM 10.0 11/17/2016 1151   ALKPHOS 82 11/17/2016 1151    AST 14 11/17/2016 1151   ALT 9 11/17/2016 1151   BILITOT 0.35 11/17/2016 1151       RADIOGRAPHIC STUDIES: Ct Biopsy  Result Date: 11-17-2016 INDICATION: PET positive peripheral right upper lobe nodule EXAM: CT-GUIDED BIOPSY RIGHT UPPER LOBE NODULE MEDICATIONS: 1% LIDOCAINE LOCALLY ANESTHESIA/SEDATION: 0.5 mg IV Versed; 100 mcg IV Fentanyl Moderate Sedation Time:  19 minutes The patient was continuously monitored during the procedure by the interventional radiology nurse under my direct supervision. PROCEDURE: The procedure, risks, benefits, and alternatives were explained to the patient. Questions regarding the procedure were encouraged and answered. The patient understands and consents to the procedure. The right axillary region was prepped with ChloraPrep in a sterile fashion, and a sterile drape was applied covering the operative field. A sterile gown and sterile gloves were used for the procedure. Previous imaging reviewed. Patient positioned slightly right anterior oblique. Noncontrast localization CT performed. The peripheral right upper lobe pulmonary nodule was localized and correlated with the PET-CT. Overlying skin marked. Under CT guidance, a(n) 17 gauge guide needle was advanced into the peripheral right upper lobe nodule. 2 18 gauge core biopsies were obtained. The guide needle was removed following insertion of the biosentry occlusion device. Final imaging was performed. Patient tolerated the procedure well without complication. Vital sign monitoring by nursing staff during the procedure will continue as patient is in the special procedures unit for post procedure observation. FINDINGS: The images document guide needle placement within the peripheral right upper lobe nodule. Post biopsy images demonstrate small pneumothorax. COMPLICATIONS: SIR Level A - No therapy, no consequence. IMPRESSION: Successful CT-guided core biopsy of the peripheral right upper lobe nodule Electronically Signed    By: Jerilynn Mages.  Shick M.D.   On: 17-Nov-2016 13:44   Dg Chest Port 1 View  Result Date: 17-Nov-2016 CLINICAL DATA:  Status post right upper lobe lung biopsy EXAM: PORTABLE CHEST 1 VIEW COMPARISON:  11/17/16 FINDINGS: Cardiac shadow is within normal limits. The lungs are well aerated bilaterally. The known right upper lobe nodule is not well appreciated on this exam likely related to its size. A tiny right apical pneumothorax is identified not significantly changed from the intraprocedural images. IMPRESSION: Tiny residual right pneumothorax. No significant change from the prior intra procedural films is noted These findings were communicated to Dr. Annamaria Boots at the time of exam interpretation. Electronically Signed   By: Inez Catalina M.D.   On: 11/17/2016 14:46    ASSESSMENT: This is a very pleasant 71 years old white male recently diagnosed with a stage IIIa (T1a, N2, M0) non-small cell lung cancer, squamous cell carcinoma presented with right middle lobe pulmonary nodule in addition to ipsilateral hilar and ipsilateral mediastinal lymphadenopathy diagnosed in June 2018.   PLAN: I had a lengthy discussion with the patient and his daughter today about his current disease stage, prognosis and  treatment options. I personally and independently reviewed his imaging studies and discuss the results and showed the images to the patient and his daughter. I recommended for the patient to complete the staging workup by ordering a MRI of the brain to rule out brain metastasis. I discussed with the patient his treatment options. I recommended for the patient a course of concurrent chemoradiation with weekly carboplatin for AUC of 2 and paclitaxel 45 MG/M2 followed by consolidation immunotherapy with Imfinzi (Durvalumab) if he has no evidence for disease progression. I discussed with the patient adverse effect of the chemotherapy including but not limited to alopecia, myelosuppression, nausea and vomiting, peripheral  neuropathy, liver or renal dysfunction. I will arrange for the patient to have a chemotherapy education class before starting the first dose of his chemotherapy. The patient will see Dr. Sondra Come later today for evaluation and discussion of the radiotherapy option. He was seen during the multidisciplinary thoracic oncology clinic today by medical oncology, radiation oncology, thoracic navigator and physical therapist. For the smoking cessation, I strongly encouraged the patient to quit smoking and offered him a smoke cessation program. He was also given to smoke cessation material from the thoracic navigator. The patient is expected to start the first dose of his concurrent chemoradiation on 11/28/2016. I will see him back for follow-up visit in 3 weeks for reevaluation and management of any adverse effect of his treatment. The patient was advised to call immediately if he has any concerning symptoms in the interval. The patient voices understanding of current disease status and treatment options and is in agreement with the current care plan. All questions were answered. The patient knows to call the clinic with any problems, questions or concerns. We can certainly see the patient much sooner if necessary.  Thank you so much for allowing me to participate in the care of Matthew Wilkins. I will continue to follow up the patient with you and assist in his care.  I spent 55 minutes counseling the patient face to face. The total time spent in the appointment was 80 minutes.  Disclaimer: This note was dictated with voice recognition software. Similar sounding words can inadvertently be transcribed and may not be corrected upon review.   Khaylee Mcevoy K. November 17, 2016, 1:12 PM

## 2016-11-17 NOTE — Therapy (Signed)
[place], [place], [address] Phone: [phone]   Fax:  [phone]  Physical Therapy Evaluation  Patient Details  Name: Matthew Wilkins MRN: [id number] Date of Birth: [birth year] Referring Provider: Dr. Curt Bears  Encounter Date: 11/17/2016      PT End of Session - 11/17/16 1358    Visit Number 1   Number of Visits 1   PT Start Time 1320   PT Stop Time 1345   PT Time Calculation (min) 25 min   Activity Tolerance Patient tolerated treatment well   Behavior During Therapy [facility] for tasks assessed/performed      Past Medical History:  Diagnosis Date  . Anxiety   . Arthritis   . Coronary artery disease   . Diabetes mellitus   . DVT (deep venous thrombosis) ([place])   . Hypercholesterolemia   . Hypertension   . PAD (peripheral artery disease) ([place])   . SOB (shortness of breath)     Past Surgical History:  Procedure Laterality Date  . CARDIAC CATHETERIZATION N/A 06/10/2015   Procedure: Left Heart Cath and Coronary Angiography;  Surgeon: Burnell Blanks, MD;  Location: [facility];  Service: Cardiovascular;  Laterality: N/A;  . LOWER EXTREMITY ANGIOGRAM N/A 07/13/2011   Procedure: LOWER EXTREMITY ANGIOGRAM;  Surgeon: Burnell Blanks, MD;  Location: [facility] CATH LAB;  Service: Cardiovascular;  Laterality: N/A;  . OTHER SURGICAL HISTORY    . OTHER SURGICAL HISTORY    . OTHER SURGICAL HISTORY    . PERCUTANEOUS STENT INTERVENTION Right 07/13/2011   Procedure: PERCUTANEOUS STENT INTERVENTION;  Surgeon: Burnell Blanks, MD;  Location: [facility] CATH LAB;  Service: Cardiovascular;  Laterality: Right;    There were no vitals filed for this visit.       Subjective Assessment - 11/17/16 1344    Subjective "I don't need no therapy. When I had the mini strokes I did my own therapy." The doctors in the [facility] sent me home. The ones at [facility] found the blood clots.  Pt. reports leg arthritis. Had a son who died of cancer; wife died last 02/23/23.   Patient is accompained by: Family member  daughter   Pertinent History Pt. presented with SOB.  Workup showed bilat. PEs and Rt. lung mass.  Was hospitalized for three days. Had CTs and PET, then CT biopsy to diagnose right upper lobe porrly differentiated adenocarcinoma.  Stage is IIIA, and chemoradiation will be recommended for treatment. Curren tsmoker, CAD, h/o TIAs (with left side weakness, per patient), and Parkinson's.   Patient Stated Goals get info from all lung clinic providers   Currently in Pain? No/denies            [facility] PT Assessment - 11/17/16 0001      Assessment   Medical Diagnosis Rt. upper lobe poorly differentiated adenocarcinoma, stage IIIA   Referring Provider Dr. Curt Bears   Onset Date/Surgical Date 08/28/16   Prior Therapy none     Precautions   Precautions Other (comment);Fall   Precaution Comments HOH; cancer precautions     Restrictions   Weight Bearing Restrictions No     Balance Screen   Has the patient fallen in the past 6 months No   Has the patient had a decrease in activity level because of a fear of falling?  No   Is the patient reluctant to leave their home because of a fear of falling?  No  Home Environment   Living Environment Private residence   Living Arrangements Alone   Type of [place] to enter   Entrance Stairs-Number of Steps 2  small   [place] One level     Prior Function   Level of Independence Independent   Leisure no regular exercise, but stays active mowing, tending a garden, using a Editor, commissioning, weed-eater; does this most days     Cognition   Overall Cognitive Status Within Functional Limits for tasks assessed     Observation/Other Assessments   Observations older gentleman who says he handles this by not believing he has cancer     Coordination   Gross Motor Movements are Fluid and  Coordinated Yes     Functional Tests   Functional tests Sit to Stand  below average for age     Sit to Stand   Comments Pt. says he is unable to stand without pushing off with his hands.     Posture/Postural Control   Posture/Postural Control Postural limitations   Postural Limitations Forward head     ROM / Strength   AROM / PROM / Strength AROM     AROM   Overall AROM Comments Trunk AROM in standing limited as follows:  flexion--reaches hands to knee level only when trying to reach floor with knees extended; extension limited 20%; right sidebend 10% loss, left 25% loss; bilat. rotation limited 20%     Ambulation/Gait   Ambulation/Gait Yes   Ambulation/Gait Assistance 7: Independent  per his report; not viewed today   Assistive device Rolling walker;Rollator;Large base quad cane  per his report; quad canes x 2; all from TIAS and relatives   Gait Comments --  also has a wheelchair     Balance   Balance Assessed Yes     Dynamic Standing Balance   Dynamic Standing - Comments reaches forward 13 inches in standing, about average for age            Objective measurements completed on examination: See above findings.                  PT Education - 11/17/16 1357    Education provided Yes   Education Details energy conservation, walking or other activity, CURE article on staying active, posture, breathing, PT info   Person(s) Educated Patient;Child(ren)   Methods Explanation;Handout   Comprehension Verbalized understanding               Lung Clinic Goals - 11/17/16 1403      Patient will be able to verbalize understanding of the benefit of exercise to decrease fatigue.   Status Achieved     Patient will be able to verbalize the importance of posture.   Status Achieved     Patient will be able to demonstrate diaphragmatic breathing for improved lung function.   Status Achieved     Patient will be able to verbalize understanding of the role  of physical therapy to prevent functional decline and who to contact if physical therapy is needed.   Status Achieved              Plan - 11/17/16 1358    Clinical Impression Statement This is a somewhat feisty gentleman who says he handles his current situation by not believing he has cancer.  His lung cancer stage is IIIA and the recommended treatment is chemoradiation.  He is a current smoker with limited trunk AROM, limited  mobility (can't stand without pushing off with hands), and forward head posture.  He has had TIAs and has various assistive devices for ambulation available to him. He reports being independent.   History and Personal Factors relevant to plan of care: multiple TIAs with left side weakness, arthritis, current smoker   Clinical Presentation Evolving   Clinical Presentation due to: new diagnosis of lung cancer and should start chemoradiatin soon   Clinical Decision Making Moderate   Rehab Potential Fair   PT Frequency One time visit   PT Treatment/Interventions Patient/family education   PT Next Visit Plan None at this time.   PT Home Exercise Plan breathing exercise, walking or other continuous activity   Consulted and Agree with Plan of Care Patient;Family member/caregiver      Patient will benefit from skilled therapeutic intervention in order to improve the following deficits and impairments:  Postural dysfunction, Decreased range of motion, Decreased mobility  Visit Diagnosis: Abnormal posture - Plan: PT plan of care cert/re-cert  Other abnormalities of gait and mobility - Plan: PT plan of care cert/re-cert      G-Codes - 78/58/85 1403    Functional Assessment Tool Used (Outpatient Only) clinical judgement   Functional Limitation Mobility: Walking and moving around   Mobility: Walking and Moving Around Current Status ([id number]) At least 1 percent but less than 20 percent impaired, limited or restricted   Mobility: Walking and Moving Around Goal Status  [phone]) At least 1 percent but less than 20 percent impaired, limited or restricted   Mobility: Walking and Moving Around Discharge Status [phone]) At least 1 percent but less than 20 percent impaired, limited or restricted       Problem List Patient Active Problem List   Diagnosis Date Noted  . Left leg DVT ([place]) 08/31/2016  . Malnutrition of moderate degree 08/30/2016  . Thrombocytopenia ([place]) 08/29/2016  . PE (pulmonary thromboembolism) ([place]) 08/28/2016  . Pulmonary nodule 08/28/2016  . Ureteral stone with hydronephrosis 08/28/2016  . Chest pain 08/28/2016  . CAD (coronary artery disease)   . TIA (transient ischemic attack) 11/27/2011  . Research study patient 08/30/2011  . PAD (peripheral artery disease) ([place]) 06/30/2011  . PVD (peripheral vascular disease) ([place]) 01/10/2011  . COPD (chronic obstructive pulmonary disease) ([place]) 07/06/2010  . TOBACCO ABUSE 11/12/2008  . CAD 09/08/2008  . DM type 2 (diabetes mellitus, type 2) ([place]) 08/26/2008  . HYPERCHOLESTEROLEMIA  IIA 08/26/2008  . ANXIETY 08/26/2008  . PARKINSON'S DISEASE 08/26/2008  . Essential hypertension 08/26/2008  . ARTHRITIS 08/26/2008  . SHORTNESS OF BREATH 08/26/2008    [place] 11/17/2016, 2:05 PM  [place], [place], [address] Phone: [phone]   Fax:  [phone]  Name: Matthew Wilkins MRN: [id number] Date of Birth: 02-12-1946  Serafina Royals, PT 11/17/16 2:05 PM

## 2016-11-17 NOTE — Telephone Encounter (Signed)
Scheduled appt per 7/5 los - Gave patient AVS and calender per los.

## 2016-11-17 NOTE — Progress Notes (Signed)
START ON PATHWAY REGIMEN - Non-Small Cell Lung     Administer weekly:     Paclitaxel      Carboplatin   **Always confirm dose/schedule in your pharmacy ordering system**  Patient Characteristics: Stage III - Unresectable, PS = 0, 1 AJCC T Category: T1b Current Disease Status: No Distant Mets or Local Recurrence AJCC N Category: N2 AJCC M Category: M0 AJCC 8 Stage Grouping: IIIA Performance Status: PS = 0, 1 Intent of Therapy: Curative Intent, Discussed with Patient 

## 2016-11-21 ENCOUNTER — Other Ambulatory Visit: Payer: Medicare HMO

## 2016-11-21 ENCOUNTER — Telehealth: Payer: Self-pay | Admitting: Internal Medicine

## 2016-11-21 ENCOUNTER — Ambulatory Visit
Admission: RE | Admit: 2016-11-21 | Discharge: 2016-11-21 | Disposition: A | Discharge status: Home / Self Care | Payer: Medicare HMO | Source: Ambulatory Visit | Attending: Radiation Oncology | Admitting: Radiation Oncology

## 2016-11-21 DIAGNOSIS — Z510 Encounter for antineoplastic radiation therapy: Secondary | ICD-10-CM | POA: Diagnosis not present

## 2016-11-21 DIAGNOSIS — C342 Malignant neoplasm of middle lobe, bronchus or lung: Secondary | ICD-10-CM | POA: Diagnosis not present

## 2016-11-21 DIAGNOSIS — R69 Illness, unspecified: Secondary | ICD-10-CM | POA: Diagnosis not present

## 2016-11-21 DIAGNOSIS — R079 Chest pain, unspecified: Secondary | ICD-10-CM | POA: Diagnosis not present

## 2016-11-21 DIAGNOSIS — C3411 Malignant neoplasm of upper lobe, right bronchus or lung: Secondary | ICD-10-CM | POA: Diagnosis not present

## 2016-11-21 DIAGNOSIS — Z9889 Other specified postprocedural states: Secondary | ICD-10-CM | POA: Diagnosis not present

## 2016-11-21 DIAGNOSIS — Z79899 Other long term (current) drug therapy: Secondary | ICD-10-CM | POA: Diagnosis not present

## 2016-11-21 DIAGNOSIS — E7800 Pure hypercholesterolemia, unspecified: Secondary | ICD-10-CM | POA: Diagnosis not present

## 2016-11-21 DIAGNOSIS — I2510 Atherosclerotic heart disease of native coronary artery without angina pectoris: Secondary | ICD-10-CM | POA: Diagnosis not present

## 2016-11-21 DIAGNOSIS — I10 Essential (primary) hypertension: Secondary | ICD-10-CM | POA: Diagnosis not present

## 2016-11-21 DIAGNOSIS — F419 Anxiety disorder, unspecified: Secondary | ICD-10-CM | POA: Diagnosis not present

## 2016-11-21 DIAGNOSIS — F17210 Nicotine dependence, cigarettes, uncomplicated: Secondary | ICD-10-CM | POA: Diagnosis not present

## 2016-11-21 DIAGNOSIS — Z7984 Long term (current) use of oral hypoglycemic drugs: Secondary | ICD-10-CM | POA: Diagnosis not present

## 2016-11-21 DIAGNOSIS — Z7901 Long term (current) use of anticoagulants: Secondary | ICD-10-CM | POA: Diagnosis not present

## 2016-11-21 DIAGNOSIS — E1151 Type 2 diabetes mellitus with diabetic peripheral angiopathy without gangrene: Secondary | ICD-10-CM | POA: Diagnosis not present

## 2016-11-21 NOTE — Telephone Encounter (Signed)
sw pt to confirm r/s chemo class 7/11 at 10 am per sch msg

## 2016-11-22 ENCOUNTER — Encounter: Payer: Self-pay | Admitting: Internal Medicine

## 2016-11-22 NOTE — Progress Notes (Signed)
Received email from [place] to contact patient's daughter regarding financial assistance.  Reviewed treatment plan and notes. Called patient's daughter and left voicemail with my contact name and number.

## 2016-11-22 NOTE — Progress Notes (Signed)
Patient's daughter returned call needing assistance with transportation cost due to distance. Advised her that he may apply for the one-time $400 [place] to help with the cost. Advised that proof of income is needed to apply. 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 Advised that they may bring bank statement or award letter that shows how much he receives. She will bring tomorrow. I will meet with them after chemo ed class.  Also advised I will email Hinton Dyer to see if he can apply for lung transportation grant. Email sent.  Patient's daughter also has concerns with food. Will provide number to social worker tomorrow. Emailed Lauren as well regarding concern.   She has my contact name and number for any additional financial questions or concerns.

## 2016-11-22 NOTE — Progress Notes (Signed)
  Radiation Oncology         [phone]) [phone] ________________________________  Name: Matthew Wilkins MRN: [id number]  Date: 11/21/2016  DOB: 1945-11-30  SIMULATION AND TREATMENT PLANNING NOTE   DIAGNOSIS:  IIIA (T1a N2 M0) non-small cell lung cancer presenting in the right lung (Adenocarcinoma)  NARRATIVE:  The patient was brought to the [facility].  Identity was confirmed.  All relevant records and images related to the planned course of therapy were reviewed.  The patient freely provided informed written consent to proceed with treatment after reviewing the details related to the planned course of therapy. The consent form was witnessed and verified by the simulation staff.  Then, the patient was set-up in a stable reproducible  supine position for radiation therapy.  CT images were obtained.  Surface markings were placed.  The CT images were loaded into the planning software.  Then the target and avoidance structures were contoured.  Treatment planning then occurred.  The radiation prescription was entered and confirmed.  Then, I designed and supervised the construction of a total of 5 medically necessary complex treatment devices.  I have requested : 3D Simulation  I have requested a DVH of the following structures: Heart, lungs, spinal cord, GTV, PTV the.  I have ordered:dose calc.  PLAN:  The patient will receive 60 Gy in 30 fractions along with radiosensitizing chemotherapy.   Special Treatment Procedure Note: The patient will be receiving radiosensitizing chemotherapy. Given the potential of increased toxicities related to combined therapy and the necessity for close monitoring of the patient and blood work, this constitutes a special treatment procedure.  -----------------------------------  Blair Promise, PhD, MD

## 2016-11-23 ENCOUNTER — Encounter: Payer: Self-pay | Admitting: Internal Medicine

## 2016-11-23 ENCOUNTER — Encounter: Payer: Self-pay | Admitting: *Deleted

## 2016-11-23 ENCOUNTER — Other Ambulatory Visit: Payer: Medicare HMO

## 2016-11-23 NOTE — Progress Notes (Signed)
Met with patient and daughter regarding financial concerns. Patient brought proof of income. Patient approved for one-time $400 [place]. Patient has a copy of the award letter as well as the expense sheet it covers along with our outpatient pharmacy contact information. They verbalized understanding. They received a gas card today from the grant.  Will get with Hinton Dyer to gather additional resources for transportation assistance. Have an application for Duanne [place]imerick for patient to complete and return to me along with supporting documents which may be able to assist financially as well.   Contacted SW Lauren regarding food concern.Will escort him downstairs to meet with her.  Gave him a Medicaid application to complete and return to DSS in their county.  They have my card for any additional financial questions or concerns.

## 2016-11-24 ENCOUNTER — Encounter: Payer: Self-pay | Admitting: *Deleted

## 2016-11-24 ENCOUNTER — Telehealth: Payer: Self-pay | Admitting: *Deleted

## 2016-11-24 DIAGNOSIS — C3411 Malignant neoplasm of upper lobe, right bronchus or lung: Secondary | ICD-10-CM | POA: Diagnosis not present

## 2016-11-24 DIAGNOSIS — I10 Essential (primary) hypertension: Secondary | ICD-10-CM | POA: Diagnosis not present

## 2016-11-24 DIAGNOSIS — E7800 Pure hypercholesterolemia, unspecified: Secondary | ICD-10-CM | POA: Diagnosis not present

## 2016-11-24 DIAGNOSIS — I2510 Atherosclerotic heart disease of native coronary artery without angina pectoris: Secondary | ICD-10-CM | POA: Diagnosis not present

## 2016-11-24 DIAGNOSIS — Z510 Encounter for antineoplastic radiation therapy: Secondary | ICD-10-CM | POA: Diagnosis not present

## 2016-11-24 DIAGNOSIS — E1151 Type 2 diabetes mellitus with diabetic peripheral angiopathy without gangrene: Secondary | ICD-10-CM | POA: Diagnosis not present

## 2016-11-24 DIAGNOSIS — Z7901 Long term (current) use of anticoagulants: Secondary | ICD-10-CM | POA: Diagnosis not present

## 2016-11-24 DIAGNOSIS — C342 Malignant neoplasm of middle lobe, bronchus or lung: Secondary | ICD-10-CM | POA: Diagnosis not present

## 2016-11-24 DIAGNOSIS — R079 Chest pain, unspecified: Secondary | ICD-10-CM | POA: Diagnosis not present

## 2016-11-24 DIAGNOSIS — R69 Illness, unspecified: Secondary | ICD-10-CM | POA: Diagnosis not present

## 2016-11-24 NOTE — Telephone Encounter (Signed)
Oncology Nurse Navigator Documentation  Oncology Nurse Navigator Flowsheets 11/24/2016  Navigator Location [facility]  Navigator Encounter Type Telephone/I received a message from [place] that patient needed help with transportation and food. I called Mr. Burgueno and updated him I had some resources to help.  I stated I would send in the mail. He asked if I could call his daughter and update.  I did. I will place resources in the mail today.   Telephone Outgoing Call  Treatment Phase Pre-Tx/Tx Discussion  Barriers/Navigation Needs Education  Education Transport During Treatment;Other  Interventions Transportation;Other  Acuity Level 2  Time Spent with Patient 45

## 2016-11-24 NOTE — Progress Notes (Signed)
[facility] Work  Clinical Social Work was referred by Estate manager/land agent for assessment of psychosocial needs. Clinical Social Worker met with patient and patient's daughter to offer support and assess for needs.  Mr. Cardenas lives alone in [place], [place].  He cooks and cleans and has no limitations at this time.  His daughter brings him to all his medical appointments.  Patient/family expressed financial concerns through treatment.  CSW provided patient with food bag, [employer], and completed  lung cancer initiative program.     Maryjean Morn, MSW, LCSW, OSW-C Clinical Social Worker [facility] [phone]

## 2016-11-24 NOTE — Progress Notes (Signed)
Per Dr. Julien Nordmann, I contacted pathology dept to send tissue obtained on 11/04/16 for foundation one and pdl 1 testing.

## 2016-11-28 ENCOUNTER — Other Ambulatory Visit (HOSPITAL_BASED_OUTPATIENT_CLINIC_OR_DEPARTMENT_OTHER): Payer: Medicare HMO

## 2016-11-28 ENCOUNTER — Ambulatory Visit (HOSPITAL_COMMUNITY)
Admission: RE | Admit: 2016-11-28 | Discharge: 2016-11-28 | Disposition: A | Discharge status: Home / Self Care | Payer: Medicare HMO | Source: Ambulatory Visit | Attending: Internal Medicine | Admitting: Internal Medicine

## 2016-11-28 ENCOUNTER — Ambulatory Visit (HOSPITAL_BASED_OUTPATIENT_CLINIC_OR_DEPARTMENT_OTHER): Payer: Medicare HMO

## 2016-11-28 ENCOUNTER — Ambulatory Visit
Admission: RE | Admit: 2016-11-28 | Discharge: 2016-11-28 | Disposition: A | Discharge status: Home / Self Care | Payer: Medicare HMO | Source: Ambulatory Visit | Attending: Radiation Oncology | Admitting: Radiation Oncology

## 2016-11-28 VITALS — BP 162/64 | HR 67 | Temp 97.8°F | Resp 16

## 2016-11-28 DIAGNOSIS — C3491 Malignant neoplasm of unspecified part of right bronchus or lung: Secondary | ICD-10-CM | POA: Diagnosis present

## 2016-11-28 DIAGNOSIS — Z510 Encounter for antineoplastic radiation therapy: Secondary | ICD-10-CM | POA: Diagnosis not present

## 2016-11-28 DIAGNOSIS — C342 Malignant neoplasm of middle lobe, bronchus or lung: Secondary | ICD-10-CM | POA: Diagnosis not present

## 2016-11-28 DIAGNOSIS — E1151 Type 2 diabetes mellitus with diabetic peripheral angiopathy without gangrene: Secondary | ICD-10-CM | POA: Diagnosis not present

## 2016-11-28 DIAGNOSIS — Z5111 Encounter for antineoplastic chemotherapy: Secondary | ICD-10-CM

## 2016-11-28 DIAGNOSIS — C3411 Malignant neoplasm of upper lobe, right bronchus or lung: Secondary | ICD-10-CM | POA: Diagnosis not present

## 2016-11-28 DIAGNOSIS — Z7901 Long term (current) use of anticoagulants: Secondary | ICD-10-CM | POA: Diagnosis not present

## 2016-11-28 DIAGNOSIS — E119 Type 2 diabetes mellitus without complications: Secondary | ICD-10-CM | POA: Diagnosis not present

## 2016-11-28 DIAGNOSIS — R079 Chest pain, unspecified: Secondary | ICD-10-CM | POA: Diagnosis not present

## 2016-11-28 DIAGNOSIS — E7800 Pure hypercholesterolemia, unspecified: Secondary | ICD-10-CM | POA: Diagnosis not present

## 2016-11-28 DIAGNOSIS — R69 Illness, unspecified: Secondary | ICD-10-CM | POA: Diagnosis not present

## 2016-11-28 DIAGNOSIS — I2510 Atherosclerotic heart disease of native coronary artery without angina pectoris: Secondary | ICD-10-CM | POA: Diagnosis not present

## 2016-11-28 DIAGNOSIS — I10 Essential (primary) hypertension: Secondary | ICD-10-CM | POA: Diagnosis not present

## 2016-11-28 LAB — COMPREHENSIVE METABOLIC PANEL
ALBUMIN: 4 g/dL (ref 3.5–5.0)
ALT: 10 U/L (ref 0–55)
AST: 14 U/L (ref 5–34)
Alkaline Phosphatase: 74 U/L (ref 40–150)
Anion Gap: 13 mEq/L — ABNORMAL HIGH (ref 3–11)
BUN: 13.1 mg/dL (ref 7.0–26.0)
CO2: 21 mEq/L — ABNORMAL LOW (ref 22–29)
Calcium: 9.5 mg/dL (ref 8.4–10.4)
Chloride: 108 mEq/L (ref 98–109)
Creatinine: 1.1 mg/dL (ref 0.7–1.3)
EGFR: 70 mL/min/{1.73_m2} — AB (ref >90)
GLUCOSE: 131 mg/dL (ref 70–140)
POTASSIUM: 3.7 meq/L (ref 3.5–5.1)
SODIUM: 141 meq/L (ref 136–145)
Total Bilirubin: 0.37 mg/dL (ref 0.20–1.20)
Total Protein: 7.2 g/dL (ref 6.4–8.3)

## 2016-11-28 LAB — CBC WITH DIFFERENTIAL/PLATELET
BASO%: 0.7 % (ref 0.0–2.0)
BASOS ABS: 0.1 10*3/uL (ref 0.0–0.1)
EOS ABS: 0.1 10*3/uL (ref 0.0–0.5)
EOS%: 1.2 % (ref 0.0–7.0)
HCT: 42.4 % (ref 38.4–49.9)
HGB: 14.3 g/dL (ref 13.0–17.1)
LYMPH%: 29 % (ref 14.0–49.0)
MCH: 29.9 pg (ref 27.2–33.4)
MCHC: 33.7 g/dL (ref 32.0–36.0)
MCV: 88.5 fL (ref 79.3–98.0)
MONO#: 0.6 10*3/uL (ref 0.1–0.9)
MONO%: 6.8 % (ref 0.0–14.0)
NEUT#: 5.6 10*3/uL (ref 1.5–6.5)
NEUT%: 62.3 % (ref 39.0–75.0)
Platelets: 194 10*3/uL (ref 140–400)
RBC: 4.79 10*6/uL (ref 4.20–5.82)
RDW: 13.8 % (ref 11.0–14.6)
WBC: 9 10*3/uL (ref 4.0–10.3)
lymph#: 2.6 10*3/uL (ref 0.9–3.3)

## 2016-11-28 MED ORDER — PALONOSETRON HCL INJECTION 0.25 MG/5ML
INTRAVENOUS | Status: AC
  Filled 2016-11-28: qty 5

## 2016-11-28 MED ORDER — DIPHENHYDRAMINE HCL 50 MG/ML IJ SOLN
INTRAMUSCULAR | Status: AC
  Filled 2016-11-28: qty 1

## 2016-11-28 MED ORDER — GADOBENATE DIMEGLUMINE 529 MG/ML IV SOLN
10.0000 mL | Freq: Once | INTRAVENOUS | Status: AC | PRN
  Administered 2016-11-28: 10 mL via INTRAVENOUS

## 2016-11-28 MED ORDER — FAMOTIDINE IN NACL 20-0.9 MG/50ML-% IV SOLN
INTRAVENOUS | Status: AC
  Filled 2016-11-28: qty 50

## 2016-11-28 MED ORDER — PALONOSETRON HCL INJECTION 0.25 MG/5ML
0.2500 mg | Freq: Once | INTRAVENOUS | Status: AC
  Administered 2016-11-28: 0.25 mg via INTRAVENOUS

## 2016-11-28 MED ORDER — DEXAMETHASONE SODIUM PHOSPHATE 100 MG/10ML IJ SOLN
20.0000 mg | Freq: Once | INTRAMUSCULAR | Status: AC
  Administered 2016-11-28: 20 mg via INTRAVENOUS
  Filled 2016-11-28: qty 2

## 2016-11-28 MED ORDER — FAMOTIDINE IN NACL 20-0.9 MG/50ML-% IV SOLN
20.0000 mg | Freq: Once | INTRAVENOUS | Status: AC
  Administered 2016-11-28: 20 mg via INTRAVENOUS

## 2016-11-28 MED ORDER — SODIUM CHLORIDE 0.9 % IV SOLN
45.0000 mg/m2 | Freq: Once | INTRAVENOUS | Status: AC
  Administered 2016-11-28: 78 mg via INTRAVENOUS
  Filled 2016-11-28: qty 13

## 2016-11-28 MED ORDER — SODIUM CHLORIDE 0.9 % IV SOLN
Freq: Once | INTRAVENOUS | Status: AC
  Administered 2016-11-28: 12:00:00 via INTRAVENOUS

## 2016-11-28 MED ORDER — DIPHENHYDRAMINE HCL 50 MG/ML IJ SOLN
50.0000 mg | Freq: Once | INTRAMUSCULAR | Status: AC
  Administered 2016-11-28: 50 mg via INTRAVENOUS

## 2016-11-28 MED ORDER — CARBOPLATIN CHEMO INJECTION 450 MG/45ML
170.0000 mg | Freq: Once | INTRAVENOUS | Status: AC
  Administered 2016-11-28: 170 mg via INTRAVENOUS
  Filled 2016-11-28: qty 17

## 2016-11-28 NOTE — Progress Notes (Signed)
  Radiation Oncology         [phone]) [phone] ________________________________  Name: Matthew Wilkins MRN: [id number]  Date: 11/28/2016  DOB: 01/27/1946  Simulation Verification Note    ICD-10-CM   1. Adenocarcinoma of right lung, stage 3 (HCC) C34.91     Status: outpatient  NARRATIVE: The patient was brought to the treatment unit and placed in the planned treatment position. The clinical setup was verified. Then port films were obtained and uploaded to the radiation oncology medical record software.  The treatment beams were carefully compared against the planned radiation fields. The position location and shape of the radiation fields was reviewed. They targeted volume of tissue appears to be appropriately covered by the radiation beams. Organs at risk appear to be excluded as planned.  Based on my personal review, I approved the simulation verification. The patient's treatment will proceed as planned.  -----------------------------------  Blair Promise, PhD, MD

## 2016-11-28 NOTE — Addendum Note (Signed)
Encounter addended by: Braulio Kiedrowski, Stephani Police, RN on: 11/28/2016  5:47 PM<BR>    Actions taken: LDA properties accepted

## 2016-11-28 NOTE — Patient Instructions (Signed)
[facility] Discharge Instructions for Patients Receiving Chemotherapy  Today you received the following chemotherapy agents Carboplatin & Taxol  To help prevent nausea and vomiting after your treatment, we encourage you to take your nausea medication  As prescribed.  If you develop nausea and vomiting that is not controlled by your nausea medication, call the clinic.   BELOW ARE SYMPTOMS THAT SHOULD BE REPORTED IMMEDIATELY:  *FEVER GREATER THAN 100.5 F  *CHILLS WITH OR WITHOUT FEVER  NAUSEA AND VOMITING THAT IS NOT CONTROLLED WITH YOUR NAUSEA MEDICATION  *UNUSUAL SHORTNESS OF BREATH  *UNUSUAL BRUISING OR BLEEDING  TENDERNESS IN MOUTH AND THROAT WITH OR WITHOUT PRESENCE OF ULCERS  *URINARY PROBLEMS  *BOWEL PROBLEMS  UNUSUAL RASH Items with * indicate a potential emergency and should be followed up as soon as possible.  Feel free to call the clinic you have any questions or concerns. The clinic phone number is [phone].  Please show the [facility] at check-in to the Emergency Department and triage nurse.  Paclitaxel injection (TAXOL) What is this medicine? PACLITAXEL (PAK li TAX el) is a chemotherapy drug. It targets fast dividing cells, like cancer cells, and causes these cells to die. This medicine is used to treat ovarian cancer, breast cancer, and other cancers. This medicine may be used for other purposes; ask your health care provider or pharmacist if you have questions. COMMON BRAND NAME(S): Onxol, Taxol What should I tell my health care provider before I take this medicine? They need to know if you have any of these conditions: -blood disorders -irregular heartbeat -infection (especially a virus infection such as chickenpox, cold sores, or herpes) -liver disease -previous or ongoing radiation therapy -an unusual or allergic reaction to paclitaxel, alcohol, polyoxyethylated castor oil, other chemotherapy agents, other medicines, foods,  dyes, or preservatives -pregnant or trying to get pregnant -breast-feeding How should I use this medicine? This drug is given as an infusion into a vein. It is administered in a hospital or clinic by a specially trained health care professional. Talk to your pediatrician regarding the use of this medicine in children. Special care may be needed. Overdosage: If you think you have taken too much of this medicine contact a poison control center or emergency room at once. NOTE: This medicine is only for you. Do not share this medicine with others. What if I miss a dose? It is important not to miss your dose. Call your doctor or health care professional if you are unable to keep an appointment. What may interact with this medicine? Do not take this medicine with any of the following medications: -disulfiram -metronidazole This medicine may also interact with the following medications: -cyclosporine -diazepam -ketoconazole -medicines to increase blood counts like filgrastim, pegfilgrastim, sargramostim -other chemotherapy drugs like cisplatin, doxorubicin, epirubicin, etoposide, teniposide, vincristine -quinidine -testosterone -vaccines -verapamil Talk to your doctor or health care professional before taking any of these medicines: -acetaminophen -aspirin -ibuprofen -ketoprofen -naproxen This list may not describe all possible interactions. Give your health care provider a list of all the medicines, herbs, non-prescription drugs, or dietary supplements you use. Also tell them if you smoke, drink alcohol, or use illegal drugs. Some items may interact with your medicine. What should I watch for while using this medicine? Your condition will be monitored carefully while you are receiving this medicine. You will need important blood work done while you are taking this medicine. This medicine can cause serious allergic reactions. To reduce your risk you will need  to take other medicine(s)  before treatment with this medicine. If you experience allergic reactions like skin rash, itching or hives, swelling of the face, lips, or tongue, tell your doctor or health care professional right away. In some cases, you may be given additional medicines to help with side effects. Follow all directions for their use. This drug may make you feel generally unwell. This is not uncommon, as chemotherapy can affect healthy cells as well as cancer cells. Report any side effects. Continue your course of treatment even though you feel ill unless your doctor tells you to stop. Call your doctor or health care professional for advice if you get a fever, chills or sore throat, or other symptoms of a cold or flu. Do not treat yourself. This drug decreases your body's ability to fight infections. Try to avoid being around people who are sick. This medicine may increase your risk to bruise or bleed. Call your doctor or health care professional if you notice any unusual bleeding. Be careful brushing and flossing your teeth or using a toothpick because you may get an infection or bleed more easily. If you have any dental work done, tell your dentist you are receiving this medicine. Avoid taking products that contain aspirin, acetaminophen, ibuprofen, naproxen, or ketoprofen unless instructed by your doctor. These medicines may hide a fever. Do not become pregnant while taking this medicine. Women should inform their doctor if they wish to become pregnant or think they might be pregnant. There is a potential for serious side effects to an unborn child. Talk to your health care professional or pharmacist for more information. Do not breast-feed an infant while taking this medicine. Men are advised not to father a child while receiving this medicine. This product may contain alcohol. Ask your pharmacist or healthcare provider if this medicine contains alcohol. Be sure to tell all healthcare providers you are taking this  medicine. Certain medicines, like metronidazole and disulfiram, can cause an unpleasant reaction when taken with alcohol. The reaction includes flushing, headache, nausea, vomiting, sweating, and increased thirst. The reaction can last from 30 minutes to several hours. What side effects may I notice from receiving this medicine? Side effects that you should report to your doctor or health care professional as soon as possible: -allergic reactions like skin rash, itching or hives, swelling of the face, lips, or tongue -low blood counts - This drug may decrease the number of white blood cells, red blood cells and platelets. You may be at increased risk for infections and bleeding. -signs of infection - fever or chills, cough, sore throat, pain or difficulty passing urine -signs of decreased platelets or bleeding - bruising, pinpoint red spots on the skin, black, tarry stools, nosebleeds -signs of decreased red blood cells - unusually weak or tired, fainting spells, lightheadedness -breathing problems -chest pain -high or low blood pressure -mouth sores -nausea and vomiting -pain, swelling, redness or irritation at the injection site -pain, tingling, numbness in the hands or feet -slow or irregular heartbeat -swelling of the ankle, feet, hands Side effects that usually do not require medical attention (report to your doctor or health care professional if they continue or are bothersome): -bone pain -complete hair loss including hair on your head, underarms, pubic hair, eyebrows, and eyelashes -changes in the color of fingernails -diarrhea -loosening of the fingernails -loss of appetite -muscle or joint pain -red flush to skin -sweating This list may not describe all possible side effects. Call your doctor for medical advice  about side effects. You may report side effects to FDA at 1-800-FDA-1088. Where should I keep my medicine? This drug is given in a hospital or clinic and will not be  stored at home. NOTE: This sheet is a summary. It may not cover all possible information. If you have questions about this medicine, talk to your doctor, pharmacist, or health care provider.  2018 Elsevier/Gold Standard (2015-03-03 19:58:00)   Carboplatin injection What is this medicine? CARBOPLATIN (KAR boe pla tin) is a chemotherapy drug. It targets fast dividing cells, like cancer cells, and causes these cells to die. This medicine is used to treat ovarian cancer and many other cancers. This medicine may be used for other purposes; ask your health care provider or pharmacist if you have questions. COMMON BRAND NAME(S): Paraplatin What should I tell my health care provider before I take this medicine? They need to know if you have any of these conditions: -blood disorders -hearing problems -kidney disease -recent or ongoing radiation therapy -an unusual or allergic reaction to carboplatin, cisplatin, other chemotherapy, other medicines, foods, dyes, or preservatives -pregnant or trying to get pregnant -breast-feeding How should I use this medicine? This drug is usually given as an infusion into a vein. It is administered in a hospital or clinic by a specially trained health care professional. Talk to your pediatrician regarding the use of this medicine in children. Special care may be needed. Overdosage: If you think you have taken too much of this medicine contact a poison control center or emergency room at once. NOTE: This medicine is only for you. Do not share this medicine with others. What if I miss a dose? It is important not to miss a dose. Call your doctor or health care professional if you are unable to keep an appointment. What may interact with this medicine? -medicines for seizures -medicines to increase blood counts like filgrastim, pegfilgrastim, sargramostim -some antibiotics like amikacin, gentamicin, neomycin, streptomycin, tobramycin -vaccines Talk to your doctor  or health care professional before taking any of these medicines: -acetaminophen -aspirin -ibuprofen -ketoprofen -naproxen This list may not describe all possible interactions. Give your health care provider a list of all the medicines, herbs, non-prescription drugs, or dietary supplements you use. Also tell them if you smoke, drink alcohol, or use illegal drugs. Some items may interact with your medicine. What should I watch for while using this medicine? Your condition will be monitored carefully while you are receiving this medicine. You will need important blood work done while you are taking this medicine. This drug may make you feel generally unwell. This is not uncommon, as chemotherapy can affect healthy cells as well as cancer cells. Report any side effects. Continue your course of treatment even though you feel ill unless your doctor tells you to stop. In some cases, you may be given additional medicines to help with side effects. Follow all directions for their use. Call your doctor or health care professional for advice if you get a fever, chills or sore throat, or other symptoms of a cold or flu. Do not treat yourself. This drug decreases your body's ability to fight infections. Try to avoid being around people who are sick. This medicine may increase your risk to bruise or bleed. Call your doctor or health care professional if you notice any unusual bleeding. Be careful brushing and flossing your teeth or using a toothpick because you may get an infection or bleed more easily. If you have any dental work done, tell your  dentist you are receiving this medicine. Avoid taking products that contain aspirin, acetaminophen, ibuprofen, naproxen, or ketoprofen unless instructed by your doctor. These medicines may hide a fever. Do not become pregnant while taking this medicine. Women should inform their doctor if they wish to become pregnant or think they might be pregnant. There is a potential  for serious side effects to an unborn child. Talk to your health care professional or pharmacist for more information. Do not breast-feed an infant while taking this medicine. What side effects may I notice from receiving this medicine? Side effects that you should report to your doctor or health care professional as soon as possible: -allergic reactions like skin rash, itching or hives, swelling of the face, lips, or tongue -signs of infection - fever or chills, cough, sore throat, pain or difficulty passing urine -signs of decreased platelets or bleeding - bruising, pinpoint red spots on the skin, black, tarry stools, nosebleeds -signs of decreased red blood cells - unusually weak or tired, fainting spells, lightheadedness -breathing problems -changes in hearing -changes in vision -chest pain -high blood pressure -low blood counts - This drug may decrease the number of white blood cells, red blood cells and platelets. You may be at increased risk for infections and bleeding. -nausea and vomiting -pain, swelling, redness or irritation at the injection site -pain, tingling, numbness in the hands or feet -problems with balance, talking, walking -trouble passing urine or change in the amount of urine Side effects that usually do not require medical attention (report to your doctor or health care professional if they continue or are bothersome): -hair loss -loss of appetite -metallic taste in the mouth or changes in taste This list may not describe all possible side effects. Call your doctor for medical advice about side effects. You may report side effects to FDA at 1-800-FDA-1088. Where should I keep my medicine? This drug is given in a hospital or clinic and will not be stored at home. NOTE: This sheet is a summary. It may not cover all possible information. If you have questions about this medicine, talk to your doctor, pharmacist, or health care provider.  2018 Elsevier/Gold Standard  (2007-08-07 14:38:05)

## 2016-11-29 ENCOUNTER — Ambulatory Visit
Admission: RE | Admit: 2016-11-29 | Discharge: 2016-11-29 | Disposition: A | Discharge status: Home / Self Care | Payer: Medicare HMO | Source: Ambulatory Visit | Attending: Radiation Oncology | Admitting: Radiation Oncology

## 2016-11-29 DIAGNOSIS — C3411 Malignant neoplasm of upper lobe, right bronchus or lung: Secondary | ICD-10-CM | POA: Diagnosis not present

## 2016-11-29 DIAGNOSIS — C3491 Malignant neoplasm of unspecified part of right bronchus or lung: Secondary | ICD-10-CM

## 2016-11-29 DIAGNOSIS — R69 Illness, unspecified: Secondary | ICD-10-CM | POA: Diagnosis not present

## 2016-11-29 DIAGNOSIS — E1151 Type 2 diabetes mellitus with diabetic peripheral angiopathy without gangrene: Secondary | ICD-10-CM | POA: Diagnosis not present

## 2016-11-29 DIAGNOSIS — R079 Chest pain, unspecified: Secondary | ICD-10-CM | POA: Diagnosis not present

## 2016-11-29 DIAGNOSIS — Z7901 Long term (current) use of anticoagulants: Secondary | ICD-10-CM | POA: Diagnosis not present

## 2016-11-29 DIAGNOSIS — C342 Malignant neoplasm of middle lobe, bronchus or lung: Secondary | ICD-10-CM | POA: Diagnosis not present

## 2016-11-29 DIAGNOSIS — I10 Essential (primary) hypertension: Secondary | ICD-10-CM | POA: Diagnosis not present

## 2016-11-29 DIAGNOSIS — E7800 Pure hypercholesterolemia, unspecified: Secondary | ICD-10-CM | POA: Diagnosis not present

## 2016-11-29 DIAGNOSIS — I2510 Atherosclerotic heart disease of native coronary artery without angina pectoris: Secondary | ICD-10-CM | POA: Diagnosis not present

## 2016-11-29 DIAGNOSIS — Z510 Encounter for antineoplastic radiation therapy: Secondary | ICD-10-CM | POA: Diagnosis not present

## 2016-11-29 MED ORDER — SONAFINE EX EMUL: 1.0000 "application " | Freq: Once | CUTANEOUS | Status: DC

## 2016-11-29 NOTE — Progress Notes (Signed)
Pt here for patient teaching.  Pt given Radiation and You booklet, skin care instructions, Alra deodorant and Radiaplex gel.  Reviewed areas of pertinence such as fatigue, skin changes and throat changes . Pt able to give teach back of to pat skin and use unscented/gentle soap,apply Sonafine bid and avoid applying anything to skin within 4 hours of treatment. Pt demonstrated understanding and verbalizes understanding of information given and will contact nursing with any questions or concerns.

## 2016-11-30 ENCOUNTER — Ambulatory Visit
Admission: RE | Admit: 2016-11-30 | Discharge: 2016-11-30 | Disposition: A | Discharge status: Home / Self Care | Payer: Medicare HMO | Source: Ambulatory Visit | Attending: Radiation Oncology | Admitting: Radiation Oncology

## 2016-11-30 DIAGNOSIS — I2510 Atherosclerotic heart disease of native coronary artery without angina pectoris: Secondary | ICD-10-CM | POA: Diagnosis not present

## 2016-11-30 DIAGNOSIS — Z7901 Long term (current) use of anticoagulants: Secondary | ICD-10-CM | POA: Diagnosis not present

## 2016-11-30 DIAGNOSIS — R69 Illness, unspecified: Secondary | ICD-10-CM | POA: Diagnosis not present

## 2016-11-30 DIAGNOSIS — C3411 Malignant neoplasm of upper lobe, right bronchus or lung: Secondary | ICD-10-CM | POA: Diagnosis not present

## 2016-11-30 DIAGNOSIS — R079 Chest pain, unspecified: Secondary | ICD-10-CM | POA: Diagnosis not present

## 2016-11-30 DIAGNOSIS — Z510 Encounter for antineoplastic radiation therapy: Secondary | ICD-10-CM | POA: Diagnosis not present

## 2016-11-30 DIAGNOSIS — C342 Malignant neoplasm of middle lobe, bronchus or lung: Secondary | ICD-10-CM | POA: Diagnosis not present

## 2016-11-30 DIAGNOSIS — E1151 Type 2 diabetes mellitus with diabetic peripheral angiopathy without gangrene: Secondary | ICD-10-CM | POA: Diagnosis not present

## 2016-11-30 DIAGNOSIS — I10 Essential (primary) hypertension: Secondary | ICD-10-CM | POA: Diagnosis not present

## 2016-11-30 DIAGNOSIS — E7800 Pure hypercholesterolemia, unspecified: Secondary | ICD-10-CM | POA: Diagnosis not present

## 2016-12-01 ENCOUNTER — Ambulatory Visit
Admission: RE | Admit: 2016-12-01 | Discharge: 2016-12-01 | Disposition: A | Discharge status: Home / Self Care | Payer: Medicare HMO | Source: Ambulatory Visit | Attending: Radiation Oncology | Admitting: Radiation Oncology

## 2016-12-01 ENCOUNTER — Ambulatory Visit: Payer: Medicare HMO | Admitting: Pulmonary Disease

## 2016-12-01 DIAGNOSIS — I10 Essential (primary) hypertension: Secondary | ICD-10-CM | POA: Diagnosis not present

## 2016-12-01 DIAGNOSIS — E1151 Type 2 diabetes mellitus with diabetic peripheral angiopathy without gangrene: Secondary | ICD-10-CM | POA: Diagnosis not present

## 2016-12-01 DIAGNOSIS — R079 Chest pain, unspecified: Secondary | ICD-10-CM | POA: Diagnosis not present

## 2016-12-01 DIAGNOSIS — Z510 Encounter for antineoplastic radiation therapy: Secondary | ICD-10-CM | POA: Diagnosis not present

## 2016-12-01 DIAGNOSIS — R69 Illness, unspecified: Secondary | ICD-10-CM | POA: Diagnosis not present

## 2016-12-01 DIAGNOSIS — C3411 Malignant neoplasm of upper lobe, right bronchus or lung: Secondary | ICD-10-CM | POA: Diagnosis not present

## 2016-12-01 DIAGNOSIS — Z7901 Long term (current) use of anticoagulants: Secondary | ICD-10-CM | POA: Diagnosis not present

## 2016-12-01 DIAGNOSIS — E7800 Pure hypercholesterolemia, unspecified: Secondary | ICD-10-CM | POA: Diagnosis not present

## 2016-12-01 DIAGNOSIS — I2510 Atherosclerotic heart disease of native coronary artery without angina pectoris: Secondary | ICD-10-CM | POA: Diagnosis not present

## 2016-12-01 DIAGNOSIS — C342 Malignant neoplasm of middle lobe, bronchus or lung: Secondary | ICD-10-CM | POA: Diagnosis not present

## 2016-12-02 ENCOUNTER — Ambulatory Visit
Admission: RE | Admit: 2016-12-02 | Discharge: 2016-12-02 | Disposition: A | Discharge status: Home / Self Care | Payer: Medicare HMO | Source: Ambulatory Visit | Attending: Radiation Oncology | Admitting: Radiation Oncology

## 2016-12-02 DIAGNOSIS — R69 Illness, unspecified: Secondary | ICD-10-CM | POA: Diagnosis not present

## 2016-12-02 DIAGNOSIS — Z7901 Long term (current) use of anticoagulants: Secondary | ICD-10-CM | POA: Diagnosis not present

## 2016-12-02 DIAGNOSIS — E7800 Pure hypercholesterolemia, unspecified: Secondary | ICD-10-CM | POA: Diagnosis not present

## 2016-12-02 DIAGNOSIS — Z510 Encounter for antineoplastic radiation therapy: Secondary | ICD-10-CM | POA: Diagnosis not present

## 2016-12-02 DIAGNOSIS — I10 Essential (primary) hypertension: Secondary | ICD-10-CM | POA: Diagnosis not present

## 2016-12-02 DIAGNOSIS — C3411 Malignant neoplasm of upper lobe, right bronchus or lung: Secondary | ICD-10-CM | POA: Diagnosis not present

## 2016-12-02 DIAGNOSIS — E1151 Type 2 diabetes mellitus with diabetic peripheral angiopathy without gangrene: Secondary | ICD-10-CM | POA: Diagnosis not present

## 2016-12-02 DIAGNOSIS — R079 Chest pain, unspecified: Secondary | ICD-10-CM | POA: Diagnosis not present

## 2016-12-02 DIAGNOSIS — C342 Malignant neoplasm of middle lobe, bronchus or lung: Secondary | ICD-10-CM | POA: Diagnosis not present

## 2016-12-02 DIAGNOSIS — I2510 Atherosclerotic heart disease of native coronary artery without angina pectoris: Secondary | ICD-10-CM | POA: Diagnosis not present

## 2016-12-05 ENCOUNTER — Other Ambulatory Visit (HOSPITAL_BASED_OUTPATIENT_CLINIC_OR_DEPARTMENT_OTHER): Payer: Medicare HMO

## 2016-12-05 ENCOUNTER — Ambulatory Visit (HOSPITAL_BASED_OUTPATIENT_CLINIC_OR_DEPARTMENT_OTHER): Payer: Medicare HMO | Admitting: Nurse Practitioner

## 2016-12-05 ENCOUNTER — Ambulatory Visit
Admission: RE | Admit: 2016-12-05 | Discharge: 2016-12-05 | Disposition: A | Discharge status: Home / Self Care | Payer: Medicare HMO | Source: Ambulatory Visit | Attending: Radiation Oncology | Admitting: Radiation Oncology

## 2016-12-05 ENCOUNTER — Ambulatory Visit (HOSPITAL_BASED_OUTPATIENT_CLINIC_OR_DEPARTMENT_OTHER): Payer: Medicare HMO

## 2016-12-05 VITALS — BP 135/53 | HR 66 | Temp 97.6°F | Resp 17 | Ht 68.0 in | Wt 149.4 lb

## 2016-12-05 DIAGNOSIS — C342 Malignant neoplasm of middle lobe, bronchus or lung: Secondary | ICD-10-CM

## 2016-12-05 DIAGNOSIS — Z5111 Encounter for antineoplastic chemotherapy: Secondary | ICD-10-CM | POA: Diagnosis not present

## 2016-12-05 DIAGNOSIS — R69 Illness, unspecified: Secondary | ICD-10-CM | POA: Diagnosis not present

## 2016-12-05 DIAGNOSIS — Z7901 Long term (current) use of anticoagulants: Secondary | ICD-10-CM | POA: Diagnosis not present

## 2016-12-05 DIAGNOSIS — C3491 Malignant neoplasm of unspecified part of right bronchus or lung: Secondary | ICD-10-CM

## 2016-12-05 DIAGNOSIS — I2510 Atherosclerotic heart disease of native coronary artery without angina pectoris: Secondary | ICD-10-CM | POA: Diagnosis not present

## 2016-12-05 DIAGNOSIS — E1151 Type 2 diabetes mellitus with diabetic peripheral angiopathy without gangrene: Secondary | ICD-10-CM | POA: Diagnosis not present

## 2016-12-05 DIAGNOSIS — E7800 Pure hypercholesterolemia, unspecified: Secondary | ICD-10-CM | POA: Diagnosis not present

## 2016-12-05 DIAGNOSIS — R079 Chest pain, unspecified: Secondary | ICD-10-CM | POA: Diagnosis not present

## 2016-12-05 DIAGNOSIS — I10 Essential (primary) hypertension: Secondary | ICD-10-CM | POA: Diagnosis not present

## 2016-12-05 DIAGNOSIS — C3411 Malignant neoplasm of upper lobe, right bronchus or lung: Secondary | ICD-10-CM | POA: Diagnosis not present

## 2016-12-05 DIAGNOSIS — Z510 Encounter for antineoplastic radiation therapy: Secondary | ICD-10-CM | POA: Diagnosis not present

## 2016-12-05 LAB — COMPREHENSIVE METABOLIC PANEL
ALT: 9 U/L (ref 0–55)
AST: 13 U/L (ref 5–34)
Albumin: 4 g/dL (ref 3.5–5.0)
Alkaline Phosphatase: 70 U/L (ref 40–150)
Anion Gap: 8 mEq/L (ref 3–11)
BILIRUBIN TOTAL: 0.31 mg/dL (ref 0.20–1.20)
BUN: 12.7 mg/dL (ref 7.0–26.0)
CHLORIDE: 106 meq/L (ref 98–109)
CO2: 25 meq/L (ref 22–29)
CREATININE: 1 mg/dL (ref 0.7–1.3)
Calcium: 9.6 mg/dL (ref 8.4–10.4)
EGFR: 76 mL/min/{1.73_m2} — ABNORMAL LOW (ref >90)
Glucose: 119 mg/dl (ref 70–140)
Potassium: 3.7 mEq/L (ref 3.5–5.1)
Sodium: 140 mEq/L (ref 136–145)
TOTAL PROTEIN: 7.1 g/dL (ref 6.4–8.3)

## 2016-12-05 LAB — CBC WITH DIFFERENTIAL/PLATELET
BASO%: 0.2 % (ref 0.0–2.0)
Basophils Absolute: 0 10*3/uL (ref 0.0–0.1)
EOS%: 2.3 % (ref 0.0–7.0)
Eosinophils Absolute: 0.1 10*3/uL (ref 0.0–0.5)
HEMATOCRIT: 41.2 % (ref 38.4–49.9)
HGB: 13.9 g/dL (ref 13.0–17.1)
LYMPH#: 2 10*3/uL (ref 0.9–3.3)
LYMPH%: 31.7 % (ref 14.0–49.0)
MCH: 29.6 pg (ref 27.2–33.4)
MCHC: 33.8 g/dL (ref 32.0–36.0)
MCV: 87.6 fL (ref 79.3–98.0)
MONO#: 0.4 10*3/uL (ref 0.1–0.9)
MONO%: 6.9 % (ref 0.0–14.0)
NEUT%: 58.9 % (ref 39.0–75.0)
NEUTROS ABS: 3.7 10*3/uL (ref 1.5–6.5)
Platelets: 203 10*3/uL (ref 140–400)
RBC: 4.7 10*6/uL (ref 4.20–5.82)
RDW: 14.5 % (ref 11.0–14.6)
WBC: 6.2 10*3/uL (ref 4.0–10.3)

## 2016-12-05 MED ORDER — FAMOTIDINE IN NACL 20-0.9 MG/50ML-% IV SOLN
INTRAVENOUS | Status: AC
  Filled 2016-12-05: qty 50

## 2016-12-05 MED ORDER — SODIUM CHLORIDE 0.9 % IV SOLN
Freq: Once | INTRAVENOUS | Status: AC
  Administered 2016-12-05: 15:00:00 via INTRAVENOUS

## 2016-12-05 MED ORDER — DEXTROSE 5 % IV SOLN
45.0000 mg/m2 | Freq: Once | INTRAVENOUS | Status: AC
  Administered 2016-12-05: 78 mg via INTRAVENOUS
  Filled 2016-12-05: qty 13

## 2016-12-05 MED ORDER — PALONOSETRON HCL INJECTION 0.25 MG/5ML
0.2500 mg | Freq: Once | INTRAVENOUS | Status: AC
  Administered 2016-12-05: 0.25 mg via INTRAVENOUS

## 2016-12-05 MED ORDER — FAMOTIDINE IN NACL 20-0.9 MG/50ML-% IV SOLN
20.0000 mg | Freq: Once | INTRAVENOUS | Status: AC
  Administered 2016-12-05: 20 mg via INTRAVENOUS

## 2016-12-05 MED ORDER — CARBOPLATIN CHEMO INJECTION 450 MG/45ML
165.2000 mg | Freq: Once | INTRAVENOUS | Status: AC
  Administered 2016-12-05: 170 mg via INTRAVENOUS
  Filled 2016-12-05: qty 17

## 2016-12-05 MED ORDER — DEXAMETHASONE SODIUM PHOSPHATE 100 MG/10ML IJ SOLN
20.0000 mg | Freq: Once | INTRAMUSCULAR | Status: AC
  Administered 2016-12-05: 20 mg via INTRAVENOUS
  Filled 2016-12-05: qty 2

## 2016-12-05 MED ORDER — PALONOSETRON HCL INJECTION 0.25 MG/5ML
INTRAVENOUS | Status: AC
  Filled 2016-12-05: qty 5

## 2016-12-05 MED ORDER — DIPHENHYDRAMINE HCL 50 MG/ML IJ SOLN
INTRAMUSCULAR | Status: AC
  Filled 2016-12-05: qty 1

## 2016-12-05 MED ORDER — DIPHENHYDRAMINE HCL 50 MG/ML IJ SOLN
50.0000 mg | Freq: Once | INTRAMUSCULAR | Status: AC
  Administered 2016-12-05: 50 mg via INTRAVENOUS

## 2016-12-05 NOTE — Progress Notes (Signed)
  [facility] OFFICE PROGRESS NOTE   Diagnosis:  Stage IIIa (T1a, N2, M0) non-small cell lung cancer, squamous cell carcinoma presented with right middle lobe pulmonary nodule in addition to ipsilateral hilar and ipsilateral mediastinal lymphadenopathy diagnosed in June 2018.  INTERVAL HISTORY:   Matthew Wilkins returns as scheduled. He began radiation and concurrent weekly carboplatin/Taxol chemotherapy 11/28/2016. He denies nausea/vomiting. No mouth sores. No diarrhea. No signs of allergic reaction. No change in baseline neuropathy symptoms which he attributes to diabetes. No change in baseline shortness of breath. He has an occasional cough. No fever.  Objective:  Vital signs in last 24 hours:  Blood pressure (!) 135/53, pulse 66, temperature 97.6 F (36.4 C), temperature source Oral, resp. rate 17, height 5\' 8"  (1.727 m), weight 149 lb 6.4 oz (67.8 kg), SpO2 100 %.    HEENT: No thrush or ulcers. Resp: Lungs clear bilaterally. Cardio: Regular rate and rhythm. GI: Abdomen soft and nontender. No hepatomegaly. Vascular: No leg edema.    Lab Results:  Lab Results  Component Value Date   WBC 6.2 12/05/2016   HGB 13.9 12/05/2016   HCT 41.2 12/05/2016   MCV 87.6 12/05/2016   PLT 203 12/05/2016   NEUTROABS 3.7 12/05/2016    Imaging:  No results found.  Medications: I have reviewed the patient's current medications.  Assessment/Plan: 1. Stage IIIa (T1a, N2, M0) non-small cell lung cancer, squamous cell carcinoma presented with right middle lobe pulmonary nodule in addition to ipsilateral hilar and ipsilateral mediastinal lymphadenopathy diagnosed in June 2018   Disposition: Matthew Wilkins appears stable. He continues radiation. He has completed 1 cycle of weekly carboplatin/Taxol. Plan to proceed with cycle 2 today as scheduled. He will return for a follow-up visit in 2 weeks. He will contact the office in the interim with any problems.    Ned Card ANP/GNP-BC     12/05/2016  2:31 PM

## 2016-12-05 NOTE — Patient Instructions (Signed)
[facility] Discharge Instructions for Patients Receiving Chemotherapy  Today you received the following chemotherapy agents taxol/carboplatin  To help prevent nausea and vomiting after your treatment, we encourage you to take your nausea medication as directed   If you develop nausea and vomiting that is not controlled by your nausea medication, call the clinic.   BELOW ARE SYMPTOMS THAT SHOULD BE REPORTED IMMEDIATELY:  *FEVER GREATER THAN 100.5 F  *CHILLS WITH OR WITHOUT FEVER  NAUSEA AND VOMITING THAT IS NOT CONTROLLED WITH YOUR NAUSEA MEDICATION  *UNUSUAL SHORTNESS OF BREATH  *UNUSUAL BRUISING OR BLEEDING  TENDERNESS IN MOUTH AND THROAT WITH OR WITHOUT PRESENCE OF ULCERS  *URINARY PROBLEMS  *BOWEL PROBLEMS  UNUSUAL RASH Items with * indicate a potential emergency and should be followed up as soon as possible.  Feel free to call the clinic you have any questions or concerns. The clinic phone number is [phone].  

## 2016-12-06 ENCOUNTER — Ambulatory Visit
Admission: RE | Admit: 2016-12-06 | Discharge: 2016-12-06 | Disposition: A | Discharge status: Home / Self Care | Payer: Medicare HMO | Source: Ambulatory Visit | Attending: Radiation Oncology | Admitting: Radiation Oncology

## 2016-12-06 DIAGNOSIS — Z7901 Long term (current) use of anticoagulants: Secondary | ICD-10-CM | POA: Diagnosis not present

## 2016-12-06 DIAGNOSIS — I10 Essential (primary) hypertension: Secondary | ICD-10-CM | POA: Diagnosis not present

## 2016-12-06 DIAGNOSIS — R69 Illness, unspecified: Secondary | ICD-10-CM | POA: Diagnosis not present

## 2016-12-06 DIAGNOSIS — I2510 Atherosclerotic heart disease of native coronary artery without angina pectoris: Secondary | ICD-10-CM | POA: Diagnosis not present

## 2016-12-06 DIAGNOSIS — R079 Chest pain, unspecified: Secondary | ICD-10-CM | POA: Diagnosis not present

## 2016-12-06 DIAGNOSIS — C342 Malignant neoplasm of middle lobe, bronchus or lung: Secondary | ICD-10-CM | POA: Diagnosis not present

## 2016-12-06 DIAGNOSIS — E7800 Pure hypercholesterolemia, unspecified: Secondary | ICD-10-CM | POA: Diagnosis not present

## 2016-12-06 DIAGNOSIS — Z510 Encounter for antineoplastic radiation therapy: Secondary | ICD-10-CM | POA: Diagnosis not present

## 2016-12-06 DIAGNOSIS — C3411 Malignant neoplasm of upper lobe, right bronchus or lung: Secondary | ICD-10-CM | POA: Diagnosis not present

## 2016-12-06 DIAGNOSIS — E1151 Type 2 diabetes mellitus with diabetic peripheral angiopathy without gangrene: Secondary | ICD-10-CM | POA: Diagnosis not present

## 2016-12-07 ENCOUNTER — Encounter: Payer: Self-pay | Admitting: Internal Medicine

## 2016-12-07 ENCOUNTER — Ambulatory Visit
Admission: RE | Admit: 2016-12-07 | Discharge: 2016-12-07 | Disposition: A | Discharge status: Home / Self Care | Payer: Medicare HMO | Source: Ambulatory Visit | Attending: Radiation Oncology | Admitting: Radiation Oncology

## 2016-12-07 DIAGNOSIS — R69 Illness, unspecified: Secondary | ICD-10-CM | POA: Diagnosis not present

## 2016-12-07 DIAGNOSIS — E1151 Type 2 diabetes mellitus with diabetic peripheral angiopathy without gangrene: Secondary | ICD-10-CM | POA: Diagnosis not present

## 2016-12-07 DIAGNOSIS — I10 Essential (primary) hypertension: Secondary | ICD-10-CM | POA: Diagnosis not present

## 2016-12-07 DIAGNOSIS — Z7901 Long term (current) use of anticoagulants: Secondary | ICD-10-CM | POA: Diagnosis not present

## 2016-12-07 DIAGNOSIS — I2510 Atherosclerotic heart disease of native coronary artery without angina pectoris: Secondary | ICD-10-CM | POA: Diagnosis not present

## 2016-12-07 DIAGNOSIS — E7800 Pure hypercholesterolemia, unspecified: Secondary | ICD-10-CM | POA: Diagnosis not present

## 2016-12-07 DIAGNOSIS — Z510 Encounter for antineoplastic radiation therapy: Secondary | ICD-10-CM | POA: Diagnosis not present

## 2016-12-07 DIAGNOSIS — C342 Malignant neoplasm of middle lobe, bronchus or lung: Secondary | ICD-10-CM | POA: Diagnosis not present

## 2016-12-07 DIAGNOSIS — R079 Chest pain, unspecified: Secondary | ICD-10-CM | POA: Diagnosis not present

## 2016-12-07 DIAGNOSIS — C3411 Malignant neoplasm of upper lobe, right bronchus or lung: Secondary | ICD-10-CM | POA: Diagnosis not present

## 2016-12-07 NOTE — Progress Notes (Signed)
Received email from [email or web address]. They are able to assist him financially with gas and food as well. She called the patient's daughter and left a message.

## 2016-12-08 ENCOUNTER — Ambulatory Visit
Admission: RE | Admit: 2016-12-08 | Discharge: 2016-12-08 | Disposition: A | Discharge status: Home / Self Care | Payer: Medicare HMO | Source: Ambulatory Visit | Attending: Radiation Oncology | Admitting: Radiation Oncology

## 2016-12-08 DIAGNOSIS — C3411 Malignant neoplasm of upper lobe, right bronchus or lung: Secondary | ICD-10-CM | POA: Diagnosis not present

## 2016-12-08 DIAGNOSIS — E1151 Type 2 diabetes mellitus with diabetic peripheral angiopathy without gangrene: Secondary | ICD-10-CM | POA: Diagnosis not present

## 2016-12-08 DIAGNOSIS — E7800 Pure hypercholesterolemia, unspecified: Secondary | ICD-10-CM | POA: Diagnosis not present

## 2016-12-08 DIAGNOSIS — I2510 Atherosclerotic heart disease of native coronary artery without angina pectoris: Secondary | ICD-10-CM | POA: Diagnosis not present

## 2016-12-08 DIAGNOSIS — C342 Malignant neoplasm of middle lobe, bronchus or lung: Secondary | ICD-10-CM | POA: Diagnosis not present

## 2016-12-08 DIAGNOSIS — Z510 Encounter for antineoplastic radiation therapy: Secondary | ICD-10-CM | POA: Diagnosis not present

## 2016-12-08 DIAGNOSIS — I10 Essential (primary) hypertension: Secondary | ICD-10-CM | POA: Diagnosis not present

## 2016-12-08 DIAGNOSIS — Z7901 Long term (current) use of anticoagulants: Secondary | ICD-10-CM | POA: Diagnosis not present

## 2016-12-08 DIAGNOSIS — R69 Illness, unspecified: Secondary | ICD-10-CM | POA: Diagnosis not present

## 2016-12-08 DIAGNOSIS — R079 Chest pain, unspecified: Secondary | ICD-10-CM | POA: Diagnosis not present

## 2016-12-09 ENCOUNTER — Ambulatory Visit
Admission: RE | Admit: 2016-12-09 | Discharge: 2016-12-09 | Disposition: A | Discharge status: Home / Self Care | Payer: Medicare HMO | Source: Ambulatory Visit | Attending: Radiation Oncology | Admitting: Radiation Oncology

## 2016-12-09 ENCOUNTER — Encounter: Payer: Self-pay | Admitting: *Deleted

## 2016-12-09 ENCOUNTER — Encounter (HOSPITAL_COMMUNITY): Payer: Self-pay

## 2016-12-09 DIAGNOSIS — R079 Chest pain, unspecified: Secondary | ICD-10-CM | POA: Diagnosis not present

## 2016-12-09 DIAGNOSIS — E1151 Type 2 diabetes mellitus with diabetic peripheral angiopathy without gangrene: Secondary | ICD-10-CM | POA: Diagnosis not present

## 2016-12-09 DIAGNOSIS — R69 Illness, unspecified: Secondary | ICD-10-CM | POA: Diagnosis not present

## 2016-12-09 DIAGNOSIS — Z510 Encounter for antineoplastic radiation therapy: Secondary | ICD-10-CM | POA: Diagnosis not present

## 2016-12-09 DIAGNOSIS — C3411 Malignant neoplasm of upper lobe, right bronchus or lung: Secondary | ICD-10-CM | POA: Diagnosis not present

## 2016-12-09 DIAGNOSIS — E7800 Pure hypercholesterolemia, unspecified: Secondary | ICD-10-CM | POA: Diagnosis not present

## 2016-12-09 DIAGNOSIS — I2510 Atherosclerotic heart disease of native coronary artery without angina pectoris: Secondary | ICD-10-CM | POA: Diagnosis not present

## 2016-12-09 DIAGNOSIS — I10 Essential (primary) hypertension: Secondary | ICD-10-CM | POA: Diagnosis not present

## 2016-12-09 DIAGNOSIS — Z7901 Long term (current) use of anticoagulants: Secondary | ICD-10-CM | POA: Diagnosis not present

## 2016-12-09 DIAGNOSIS — C342 Malignant neoplasm of middle lobe, bronchus or lung: Secondary | ICD-10-CM | POA: Diagnosis not present

## 2016-12-09 NOTE — Progress Notes (Signed)
Oncology Nurse Navigator Documentation  Oncology Nurse Navigator Flowsheets 12/09/2016  Navigator Location [facility]  Navigator Encounter Type Other/I followed up on molecular test results.  Placed on Dr. Worthy Flank desk.   Treatment Phase Treatment  Barriers/Navigation Needs Coordination of Care  Interventions Coordination of Care  Acuity Level 1  Time Spent with Patient 15

## 2016-12-12 ENCOUNTER — Other Ambulatory Visit (HOSPITAL_BASED_OUTPATIENT_CLINIC_OR_DEPARTMENT_OTHER): Payer: Medicare HMO

## 2016-12-12 ENCOUNTER — Ambulatory Visit
Admission: RE | Admit: 2016-12-12 | Discharge: 2016-12-12 | Disposition: A | Discharge status: Home / Self Care | Payer: Medicare HMO | Source: Ambulatory Visit | Attending: Radiation Oncology | Admitting: Radiation Oncology

## 2016-12-12 ENCOUNTER — Ambulatory Visit (HOSPITAL_BASED_OUTPATIENT_CLINIC_OR_DEPARTMENT_OTHER): Payer: Medicare HMO

## 2016-12-12 VITALS — BP 131/68 | HR 74 | Temp 97.9°F | Resp 18

## 2016-12-12 DIAGNOSIS — Z7901 Long term (current) use of anticoagulants: Secondary | ICD-10-CM | POA: Diagnosis not present

## 2016-12-12 DIAGNOSIS — C3491 Malignant neoplasm of unspecified part of right bronchus or lung: Secondary | ICD-10-CM

## 2016-12-12 DIAGNOSIS — Z5111 Encounter for antineoplastic chemotherapy: Secondary | ICD-10-CM | POA: Diagnosis not present

## 2016-12-12 DIAGNOSIS — Z510 Encounter for antineoplastic radiation therapy: Secondary | ICD-10-CM | POA: Diagnosis not present

## 2016-12-12 DIAGNOSIS — C3411 Malignant neoplasm of upper lobe, right bronchus or lung: Secondary | ICD-10-CM | POA: Diagnosis not present

## 2016-12-12 DIAGNOSIS — E7800 Pure hypercholesterolemia, unspecified: Secondary | ICD-10-CM | POA: Diagnosis not present

## 2016-12-12 DIAGNOSIS — C342 Malignant neoplasm of middle lobe, bronchus or lung: Secondary | ICD-10-CM | POA: Diagnosis not present

## 2016-12-12 DIAGNOSIS — R079 Chest pain, unspecified: Secondary | ICD-10-CM | POA: Diagnosis not present

## 2016-12-12 DIAGNOSIS — E1151 Type 2 diabetes mellitus with diabetic peripheral angiopathy without gangrene: Secondary | ICD-10-CM | POA: Diagnosis not present

## 2016-12-12 DIAGNOSIS — I2510 Atherosclerotic heart disease of native coronary artery without angina pectoris: Secondary | ICD-10-CM | POA: Diagnosis not present

## 2016-12-12 DIAGNOSIS — R69 Illness, unspecified: Secondary | ICD-10-CM | POA: Diagnosis not present

## 2016-12-12 DIAGNOSIS — I10 Essential (primary) hypertension: Secondary | ICD-10-CM | POA: Diagnosis not present

## 2016-12-12 LAB — COMPREHENSIVE METABOLIC PANEL
ALT: 8 U/L (ref 0–55)
ANION GAP: 8 meq/L (ref 3–11)
AST: 14 U/L (ref 5–34)
Albumin: 3.7 g/dL (ref 3.5–5.0)
Alkaline Phosphatase: 64 U/L (ref 40–150)
BUN: 13.2 mg/dL (ref 7.0–26.0)
CALCIUM: 9.2 mg/dL (ref 8.4–10.4)
CHLORIDE: 111 meq/L — AB (ref 98–109)
CO2: 21 mEq/L — ABNORMAL LOW (ref 22–29)
Creatinine: 1.1 mg/dL (ref 0.7–1.3)
EGFR: 64 mL/min/{1.73_m2} — ABNORMAL LOW (ref >90)
Glucose: 245 mg/dl — ABNORMAL HIGH (ref 70–140)
POTASSIUM: 3.9 meq/L (ref 3.5–5.1)
Sodium: 139 mEq/L (ref 136–145)
Total Bilirubin: 0.36 mg/dL (ref 0.20–1.20)
Total Protein: 6.5 g/dL (ref 6.4–8.3)

## 2016-12-12 LAB — CBC WITH DIFFERENTIAL/PLATELET
BASO%: 0.3 % (ref 0.0–2.0)
BASOS ABS: 0 10*3/uL (ref 0.0–0.1)
EOS%: 2 % (ref 0.0–7.0)
Eosinophils Absolute: 0.1 10*3/uL (ref 0.0–0.5)
HEMATOCRIT: 39.7 % (ref 38.4–49.9)
HGB: 13.5 g/dL (ref 13.0–17.1)
LYMPH#: 1 10*3/uL (ref 0.9–3.3)
LYMPH%: 19.8 % (ref 14.0–49.0)
MCH: 30.2 pg (ref 27.2–33.4)
MCHC: 34.1 g/dL (ref 32.0–36.0)
MCV: 88.6 fL (ref 79.3–98.0)
MONO#: 0.4 10*3/uL (ref 0.1–0.9)
MONO%: 8 % (ref 0.0–14.0)
NEUT#: 3.4 10*3/uL (ref 1.5–6.5)
NEUT%: 69.9 % (ref 39.0–75.0)
PLATELETS: 199 10*3/uL (ref 140–400)
RBC: 4.48 10*6/uL (ref 4.20–5.82)
RDW: 14.5 % (ref 11.0–14.6)
WBC: 4.9 10*3/uL (ref 4.0–10.3)

## 2016-12-12 MED ORDER — PALONOSETRON HCL INJECTION 0.25 MG/5ML
INTRAVENOUS | Status: AC
  Filled 2016-12-12: qty 5

## 2016-12-12 MED ORDER — FAMOTIDINE IN NACL 20-0.9 MG/50ML-% IV SOLN
INTRAVENOUS | Status: AC
  Filled 2016-12-12: qty 50

## 2016-12-12 MED ORDER — SODIUM CHLORIDE 0.9 % IV SOLN
20.0000 mg | Freq: Once | INTRAVENOUS | Status: AC
  Administered 2016-12-12: 20 mg via INTRAVENOUS
  Filled 2016-12-12: qty 2

## 2016-12-12 MED ORDER — DIPHENHYDRAMINE HCL 50 MG/ML IJ SOLN
50.0000 mg | Freq: Once | INTRAMUSCULAR | Status: AC
  Administered 2016-12-12: 50 mg via INTRAVENOUS

## 2016-12-12 MED ORDER — SODIUM CHLORIDE 0.9 % IV SOLN
165.2000 mg | Freq: Once | INTRAVENOUS | Status: AC
  Administered 2016-12-12: 170 mg via INTRAVENOUS
  Filled 2016-12-12: qty 17

## 2016-12-12 MED ORDER — DEXAMETHASONE SODIUM PHOSPHATE 10 MG/ML IJ SOLN
INTRAMUSCULAR | Status: AC
  Filled 2016-12-12: qty 1

## 2016-12-12 MED ORDER — FAMOTIDINE IN NACL 20-0.9 MG/50ML-% IV SOLN
20.0000 mg | Freq: Once | INTRAVENOUS | Status: AC
  Administered 2016-12-12: 20 mg via INTRAVENOUS

## 2016-12-12 MED ORDER — DIPHENHYDRAMINE HCL 50 MG/ML IJ SOLN
INTRAMUSCULAR | Status: AC
  Filled 2016-12-12: qty 1

## 2016-12-12 MED ORDER — SODIUM CHLORIDE 0.9 % IV SOLN
45.0000 mg/m2 | Freq: Once | INTRAVENOUS | Status: AC
  Administered 2016-12-12: 78 mg via INTRAVENOUS
  Filled 2016-12-12: qty 13

## 2016-12-12 MED ORDER — PALONOSETRON HCL INJECTION 0.25 MG/5ML
0.2500 mg | Freq: Once | INTRAVENOUS | Status: AC
  Administered 2016-12-12: 0.25 mg via INTRAVENOUS

## 2016-12-12 MED ORDER — SODIUM CHLORIDE 0.9 % IV SOLN
Freq: Once | INTRAVENOUS | Status: AC
  Administered 2016-12-12: 13:00:00 via INTRAVENOUS

## 2016-12-12 NOTE — Patient Instructions (Signed)
[facility] Discharge Instructions for Patients Receiving Chemotherapy  Today you received the following chemotherapy agents Taxol and Carboplatin. To help prevent nausea and vomiting after your treatment, we encourage you to take your nausea medication as directed.  If you develop nausea and vomiting that is not controlled by your nausea medication, call the clinic.   BELOW ARE SYMPTOMS THAT SHOULD BE REPORTED IMMEDIATELY:  *FEVER GREATER THAN 100.5 F  *CHILLS WITH OR WITHOUT FEVER  NAUSEA AND VOMITING THAT IS NOT CONTROLLED WITH YOUR NAUSEA MEDICATION  *UNUSUAL SHORTNESS OF BREATH  *UNUSUAL BRUISING OR BLEEDING  TENDERNESS IN MOUTH AND THROAT WITH OR WITHOUT PRESENCE OF ULCERS  *URINARY PROBLEMS  *BOWEL PROBLEMS  UNUSUAL RASH Items with * indicate a potential emergency and should be followed up as soon as possible.  Feel free to call the clinic you have any questions or concerns. The clinic phone number is [phone].  Please show the CHEMO ALERT CARD at check-in to the Emergency Department and triage nurse.    

## 2016-12-13 ENCOUNTER — Ambulatory Visit
Admission: RE | Admit: 2016-12-13 | Discharge: 2016-12-13 | Disposition: A | Discharge status: Home / Self Care | Payer: Medicare HMO | Source: Ambulatory Visit | Attending: Radiation Oncology | Admitting: Radiation Oncology

## 2016-12-13 DIAGNOSIS — C342 Malignant neoplasm of middle lobe, bronchus or lung: Secondary | ICD-10-CM | POA: Diagnosis not present

## 2016-12-13 DIAGNOSIS — E1151 Type 2 diabetes mellitus with diabetic peripheral angiopathy without gangrene: Secondary | ICD-10-CM | POA: Diagnosis not present

## 2016-12-13 DIAGNOSIS — I10 Essential (primary) hypertension: Secondary | ICD-10-CM | POA: Diagnosis not present

## 2016-12-13 DIAGNOSIS — Z7901 Long term (current) use of anticoagulants: Secondary | ICD-10-CM | POA: Diagnosis not present

## 2016-12-13 DIAGNOSIS — R079 Chest pain, unspecified: Secondary | ICD-10-CM | POA: Diagnosis not present

## 2016-12-13 DIAGNOSIS — Z510 Encounter for antineoplastic radiation therapy: Secondary | ICD-10-CM | POA: Diagnosis not present

## 2016-12-13 DIAGNOSIS — R69 Illness, unspecified: Secondary | ICD-10-CM | POA: Diagnosis not present

## 2016-12-13 DIAGNOSIS — E7800 Pure hypercholesterolemia, unspecified: Secondary | ICD-10-CM | POA: Diagnosis not present

## 2016-12-13 DIAGNOSIS — I2510 Atherosclerotic heart disease of native coronary artery without angina pectoris: Secondary | ICD-10-CM | POA: Diagnosis not present

## 2016-12-13 DIAGNOSIS — C3411 Malignant neoplasm of upper lobe, right bronchus or lung: Secondary | ICD-10-CM | POA: Diagnosis not present

## 2016-12-14 ENCOUNTER — Ambulatory Visit
Admission: RE | Admit: 2016-12-14 | Discharge: 2016-12-14 | Disposition: A | Discharge status: Home / Self Care | Payer: Medicare HMO | Source: Ambulatory Visit | Attending: Radiation Oncology | Admitting: Radiation Oncology

## 2016-12-14 DIAGNOSIS — Z510 Encounter for antineoplastic radiation therapy: Secondary | ICD-10-CM | POA: Diagnosis not present

## 2016-12-14 DIAGNOSIS — I10 Essential (primary) hypertension: Secondary | ICD-10-CM | POA: Diagnosis not present

## 2016-12-14 DIAGNOSIS — R079 Chest pain, unspecified: Secondary | ICD-10-CM | POA: Diagnosis not present

## 2016-12-14 DIAGNOSIS — I2510 Atherosclerotic heart disease of native coronary artery without angina pectoris: Secondary | ICD-10-CM | POA: Diagnosis not present

## 2016-12-14 DIAGNOSIS — C3411 Malignant neoplasm of upper lobe, right bronchus or lung: Secondary | ICD-10-CM | POA: Diagnosis not present

## 2016-12-14 DIAGNOSIS — E7800 Pure hypercholesterolemia, unspecified: Secondary | ICD-10-CM | POA: Diagnosis not present

## 2016-12-14 DIAGNOSIS — Z7901 Long term (current) use of anticoagulants: Secondary | ICD-10-CM | POA: Diagnosis not present

## 2016-12-14 DIAGNOSIS — C342 Malignant neoplasm of middle lobe, bronchus or lung: Secondary | ICD-10-CM | POA: Diagnosis not present

## 2016-12-14 DIAGNOSIS — E1151 Type 2 diabetes mellitus with diabetic peripheral angiopathy without gangrene: Secondary | ICD-10-CM | POA: Diagnosis not present

## 2016-12-14 DIAGNOSIS — R69 Illness, unspecified: Secondary | ICD-10-CM | POA: Diagnosis not present

## 2016-12-15 ENCOUNTER — Ambulatory Visit
Admission: RE | Admit: 2016-12-15 | Discharge: 2016-12-15 | Disposition: A | Discharge status: Home / Self Care | Payer: Medicare HMO | Source: Ambulatory Visit | Attending: Radiation Oncology | Admitting: Radiation Oncology

## 2016-12-15 DIAGNOSIS — Z7901 Long term (current) use of anticoagulants: Secondary | ICD-10-CM | POA: Diagnosis not present

## 2016-12-15 DIAGNOSIS — Z510 Encounter for antineoplastic radiation therapy: Secondary | ICD-10-CM | POA: Diagnosis not present

## 2016-12-15 DIAGNOSIS — I2510 Atherosclerotic heart disease of native coronary artery without angina pectoris: Secondary | ICD-10-CM | POA: Diagnosis not present

## 2016-12-15 DIAGNOSIS — C3411 Malignant neoplasm of upper lobe, right bronchus or lung: Secondary | ICD-10-CM | POA: Diagnosis not present

## 2016-12-15 DIAGNOSIS — C342 Malignant neoplasm of middle lobe, bronchus or lung: Secondary | ICD-10-CM | POA: Diagnosis not present

## 2016-12-15 DIAGNOSIS — E7800 Pure hypercholesterolemia, unspecified: Secondary | ICD-10-CM | POA: Diagnosis not present

## 2016-12-15 DIAGNOSIS — I10 Essential (primary) hypertension: Secondary | ICD-10-CM | POA: Diagnosis not present

## 2016-12-15 DIAGNOSIS — R079 Chest pain, unspecified: Secondary | ICD-10-CM | POA: Diagnosis not present

## 2016-12-15 DIAGNOSIS — R69 Illness, unspecified: Secondary | ICD-10-CM | POA: Diagnosis not present

## 2016-12-15 DIAGNOSIS — E1151 Type 2 diabetes mellitus with diabetic peripheral angiopathy without gangrene: Secondary | ICD-10-CM | POA: Diagnosis not present

## 2016-12-16 ENCOUNTER — Ambulatory Visit
Admission: RE | Admit: 2016-12-16 | Discharge: 2016-12-16 | Disposition: A | Discharge status: Home / Self Care | Payer: Medicare HMO | Source: Ambulatory Visit | Attending: Radiation Oncology | Admitting: Radiation Oncology

## 2016-12-16 DIAGNOSIS — R079 Chest pain, unspecified: Secondary | ICD-10-CM | POA: Diagnosis not present

## 2016-12-16 DIAGNOSIS — Z7901 Long term (current) use of anticoagulants: Secondary | ICD-10-CM | POA: Diagnosis not present

## 2016-12-16 DIAGNOSIS — Z510 Encounter for antineoplastic radiation therapy: Secondary | ICD-10-CM | POA: Diagnosis not present

## 2016-12-16 DIAGNOSIS — I10 Essential (primary) hypertension: Secondary | ICD-10-CM | POA: Diagnosis not present

## 2016-12-16 DIAGNOSIS — I2510 Atherosclerotic heart disease of native coronary artery without angina pectoris: Secondary | ICD-10-CM | POA: Diagnosis not present

## 2016-12-16 DIAGNOSIS — E7800 Pure hypercholesterolemia, unspecified: Secondary | ICD-10-CM | POA: Diagnosis not present

## 2016-12-16 DIAGNOSIS — C3411 Malignant neoplasm of upper lobe, right bronchus or lung: Secondary | ICD-10-CM | POA: Diagnosis not present

## 2016-12-16 DIAGNOSIS — C342 Malignant neoplasm of middle lobe, bronchus or lung: Secondary | ICD-10-CM | POA: Diagnosis not present

## 2016-12-16 DIAGNOSIS — E1151 Type 2 diabetes mellitus with diabetic peripheral angiopathy without gangrene: Secondary | ICD-10-CM | POA: Diagnosis not present

## 2016-12-16 DIAGNOSIS — R69 Illness, unspecified: Secondary | ICD-10-CM | POA: Diagnosis not present

## 2016-12-19 ENCOUNTER — Ambulatory Visit
Admission: RE | Admit: 2016-12-19 | Discharge: 2016-12-19 | Disposition: A | Discharge status: Home / Self Care | Payer: Medicare HMO | Source: Ambulatory Visit | Attending: Radiation Oncology | Admitting: Radiation Oncology

## 2016-12-19 ENCOUNTER — Ambulatory Visit (HOSPITAL_BASED_OUTPATIENT_CLINIC_OR_DEPARTMENT_OTHER): Payer: Medicare HMO | Admitting: Internal Medicine

## 2016-12-19 ENCOUNTER — Other Ambulatory Visit (HOSPITAL_BASED_OUTPATIENT_CLINIC_OR_DEPARTMENT_OTHER): Payer: Medicare HMO

## 2016-12-19 ENCOUNTER — Ambulatory Visit (HOSPITAL_BASED_OUTPATIENT_CLINIC_OR_DEPARTMENT_OTHER): Payer: Medicare HMO

## 2016-12-19 ENCOUNTER — Encounter: Payer: Self-pay | Admitting: Internal Medicine

## 2016-12-19 VITALS — BP 137/77 | HR 67 | Temp 97.7°F | Resp 18 | Ht 68.0 in | Wt 148.6 lb

## 2016-12-19 DIAGNOSIS — Z5111 Encounter for antineoplastic chemotherapy: Secondary | ICD-10-CM | POA: Diagnosis not present

## 2016-12-19 DIAGNOSIS — R69 Illness, unspecified: Secondary | ICD-10-CM | POA: Diagnosis not present

## 2016-12-19 DIAGNOSIS — C3491 Malignant neoplasm of unspecified part of right bronchus or lung: Secondary | ICD-10-CM

## 2016-12-19 DIAGNOSIS — C3411 Malignant neoplasm of upper lobe, right bronchus or lung: Secondary | ICD-10-CM | POA: Diagnosis not present

## 2016-12-19 DIAGNOSIS — E1151 Type 2 diabetes mellitus with diabetic peripheral angiopathy without gangrene: Secondary | ICD-10-CM | POA: Diagnosis not present

## 2016-12-19 DIAGNOSIS — E7800 Pure hypercholesterolemia, unspecified: Secondary | ICD-10-CM | POA: Diagnosis not present

## 2016-12-19 DIAGNOSIS — R079 Chest pain, unspecified: Secondary | ICD-10-CM | POA: Diagnosis not present

## 2016-12-19 DIAGNOSIS — C342 Malignant neoplasm of middle lobe, bronchus or lung: Secondary | ICD-10-CM | POA: Diagnosis not present

## 2016-12-19 DIAGNOSIS — I2510 Atherosclerotic heart disease of native coronary artery without angina pectoris: Secondary | ICD-10-CM | POA: Diagnosis not present

## 2016-12-19 DIAGNOSIS — I10 Essential (primary) hypertension: Secondary | ICD-10-CM | POA: Diagnosis not present

## 2016-12-19 DIAGNOSIS — Z7901 Long term (current) use of anticoagulants: Secondary | ICD-10-CM | POA: Diagnosis not present

## 2016-12-19 DIAGNOSIS — Z510 Encounter for antineoplastic radiation therapy: Secondary | ICD-10-CM | POA: Diagnosis not present

## 2016-12-19 LAB — CBC WITH DIFFERENTIAL/PLATELET
BASO%: 2.1 % — ABNORMAL HIGH (ref 0.0–2.0)
Basophils Absolute: 0.1 10*3/uL (ref 0.0–0.1)
EOS ABS: 0.1 10*3/uL (ref 0.0–0.5)
EOS%: 1.4 % (ref 0.0–7.0)
HCT: 38.6 % (ref 38.4–49.9)
HEMOGLOBIN: 13.2 g/dL (ref 13.0–17.1)
LYMPH%: 15.9 % (ref 14.0–49.0)
MCH: 30.3 pg (ref 27.2–33.4)
MCHC: 34.2 g/dL (ref 32.0–36.0)
MCV: 88.7 fL (ref 79.3–98.0)
MONO#: 0.4 10*3/uL (ref 0.1–0.9)
MONO%: 9.5 % (ref 0.0–14.0)
NEUT%: 71.1 % (ref 39.0–75.0)
NEUTROS ABS: 3.1 10*3/uL (ref 1.5–6.5)
Platelets: 165 10*3/uL (ref 140–400)
RBC: 4.35 10*6/uL (ref 4.20–5.82)
RDW: 14.9 % — AB (ref 11.0–14.6)
WBC: 4.4 10*3/uL (ref 4.0–10.3)
lymph#: 0.7 10*3/uL — ABNORMAL LOW (ref 0.9–3.3)

## 2016-12-19 LAB — COMPREHENSIVE METABOLIC PANEL
ALT: 11 U/L (ref 0–55)
AST: 14 U/L (ref 5–34)
Albumin: 3.9 g/dL (ref 3.5–5.0)
Alkaline Phosphatase: 65 U/L (ref 40–150)
Anion Gap: 8 mEq/L (ref 3–11)
BUN: 14.2 mg/dL (ref 7.0–26.0)
CALCIUM: 9.4 mg/dL (ref 8.4–10.4)
CHLORIDE: 109 meq/L (ref 98–109)
CO2: 21 mEq/L — ABNORMAL LOW (ref 22–29)
Creatinine: 1.1 mg/dL (ref 0.7–1.3)
EGFR: 70 mL/min/{1.73_m2} — AB (ref >90)
Glucose: 152 mg/dl — ABNORMAL HIGH (ref 70–140)
POTASSIUM: 3.7 meq/L (ref 3.5–5.1)
Sodium: 138 mEq/L (ref 136–145)
Total Bilirubin: 0.42 mg/dL (ref 0.20–1.20)
Total Protein: 6.9 g/dL (ref 6.4–8.3)

## 2016-12-19 MED ORDER — CARBOPLATIN CHEMO INJECTION 450 MG/45ML
170.0000 mg | Freq: Once | INTRAVENOUS | Status: AC
  Administered 2016-12-19: 170 mg via INTRAVENOUS
  Filled 2016-12-19: qty 17

## 2016-12-19 MED ORDER — PALONOSETRON HCL INJECTION 0.25 MG/5ML
INTRAVENOUS | Status: AC
  Filled 2016-12-19: qty 5

## 2016-12-19 MED ORDER — DEXAMETHASONE SODIUM PHOSPHATE 100 MG/10ML IJ SOLN
20.0000 mg | Freq: Once | INTRAMUSCULAR | Status: AC
  Administered 2016-12-19: 20 mg via INTRAVENOUS
  Filled 2016-12-19: qty 2

## 2016-12-19 MED ORDER — FAMOTIDINE IN NACL 20-0.9 MG/50ML-% IV SOLN
INTRAVENOUS | Status: AC
  Filled 2016-12-19: qty 50

## 2016-12-19 MED ORDER — FAMOTIDINE IN NACL 20-0.9 MG/50ML-% IV SOLN
20.0000 mg | Freq: Once | INTRAVENOUS | Status: AC
  Administered 2016-12-19: 20 mg via INTRAVENOUS

## 2016-12-19 MED ORDER — DIPHENHYDRAMINE HCL 50 MG/ML IJ SOLN
50.0000 mg | Freq: Once | INTRAMUSCULAR | Status: AC
  Administered 2016-12-19: 50 mg via INTRAVENOUS

## 2016-12-19 MED ORDER — DIPHENHYDRAMINE HCL 50 MG/ML IJ SOLN
INTRAMUSCULAR | Status: AC
  Filled 2016-12-19: qty 1

## 2016-12-19 MED ORDER — SODIUM CHLORIDE 0.9 % IV SOLN
Freq: Once | INTRAVENOUS | Status: AC
  Administered 2016-12-19: 13:00:00 via INTRAVENOUS

## 2016-12-19 MED ORDER — PACLITAXEL CHEMO INJECTION 300 MG/50ML
45.0000 mg/m2 | Freq: Once | INTRAVENOUS | Status: AC
  Administered 2016-12-19: 78 mg via INTRAVENOUS
  Filled 2016-12-19: qty 13

## 2016-12-19 MED ORDER — PALONOSETRON HCL INJECTION 0.25 MG/5ML
0.2500 mg | Freq: Once | INTRAVENOUS | Status: AC
  Administered 2016-12-19: 0.25 mg via INTRAVENOUS

## 2016-12-19 NOTE — Progress Notes (Signed)
[place] [phone]   [phone]  OFFICE PROGRESS NOTE  [name], MD [address] [place] [address]  DIAGNOSIS: Stage IIIA (T1a, N2, M0) non-small cell lung cancer, squamous cell carcinoma presented with right middle lobe pulmonary nodule in addition to ipsilateral hilar and ipsilateral mediastinal lymphadenopathy diagnosed in June 2018.  PRIOR THERAPY: None.  CURRENT THERAPY: A course of concurrent chemoradiation with weekly carboplatin for AUC of 2 and paclitaxel 45 MG/M2. Status post 3 cycles.  INTERVAL HISTORY: Matthew Wilkins 71 y.o. male returns to the clinic today for follow-up visit accompanied by his wife. The patient is feeling fine today with no specific complaints. He continues to tolerate his treatment fairly well. He denied having any chest pain, shortness of breath, cough or hemoptysis. He denied having any fever or chills. He has no nausea, vomiting, diarrhea or constipation. The patient has no significant weight loss or night sweats. He is here today for evaluation before starting cycle #4 of his treatment. Unfortunately he continues to smoke at regular basis.  MEDICAL HISTORY: Past Medical History:  Diagnosis Date  . Adenocarcinoma of right lung, stage 3 ([place]) 11/17/2016  . Anxiety   . Arthritis   . Coronary artery disease   . Diabetes mellitus   . DVT (deep venous thrombosis) ([place])   . Encounter for antineoplastic chemotherapy 11/17/2016  . Hypercholesterolemia   . Hypertension   . PAD (peripheral artery disease) ([place])   . SOB (shortness of breath)     ALLERGIES:  is allergic to aspirin.  MEDICATIONS:  Current Outpatient Prescriptions  Medication Sig Dispense Refill  . apixaban (ELIQUIS) 5 MG TABS tablet Take 10mg  po bid for 7 days then 5mg  po bid (Patient taking differently: 5 mg 2 (two) times daily. Take 10mg  po bid for 7 days then 5mg  po bid) 60 tablet 1  . gabapentin (NEURONTIN) 300 MG capsule Take  1 capsule by mouth 3 (three) times daily.    [name] glipiZIDE (GLUCOTROL XL) 5 MG 24 hr tablet Take 10 mg by mouth 2 (two) times daily.     [name] levothyroxine (SYNTHROID) 25 MCG tablet Take 1 tablet (25 mcg total) by mouth daily before breakfast. 30 tablet 11  . lisinopril (PRINIVIL,ZESTRIL) 5 MG tablet Take 5 mg by mouth daily.    . metFORMIN (GLUCOPHAGE-XR) 500 MG 24 hr tablet Take 2 tablets by mouth 2 (two) times daily.    . pantoprazole (PROTONIX) 40 MG tablet Take 40 mg by mouth 2 (two) times daily.     . prochlorperazine (COMPAZINE) 10 MG tablet Take 1 tablet (10 mg total) by mouth every 6 (six) hours as needed for nausea or vomiting. 30 tablet 0  . rOPINIRole (REQUIP) 3 MG tablet Take 3 mg by mouth 4 (four) times daily as needed (Restless Leg).     . rosuvastatin (CRESTOR) 20 MG tablet Take 1 tablet (20 mg total) by mouth daily. 30 tablet 11  . sertraline (ZOLOFT) 25 MG tablet     . tiotropium (SPIRIVA) 18 MCG inhalation capsule Place 1 capsule into inhaler and inhale daily.    . VENTOLIN HFA 108 (90 Base) MCG/ACT inhaler     . ZETIA 10 MG tablet Take 10 mg by mouth daily.     No current facility-administered medications for this visit.     SURGICAL HISTORY:  Past Surgical History:  Procedure Laterality Date  . CARDIAC CATHETERIZATION N/A 06/10/2015   Procedure: Left  Heart Cath and Coronary Angiography;  Surgeon: Burnell Blanks, MD;  Location: [place] CV LAB;  Service: Cardiovascular;  Laterality: N/A;  . LOWER EXTREMITY ANGIOGRAM N/A 07/13/2011   Procedure: LOWER EXTREMITY ANGIOGRAM;  Surgeon: Burnell Blanks, MD;  Location: [facility] CATH LAB;  Service: Cardiovascular;  Laterality: N/A;  . OTHER SURGICAL HISTORY    . OTHER SURGICAL HISTORY    . OTHER SURGICAL HISTORY    . PERCUTANEOUS STENT INTERVENTION Right 07/13/2011   Procedure: PERCUTANEOUS STENT INTERVENTION;  Surgeon: Burnell Blanks, MD;  Location: [facility] CATH LAB;  Service: Cardiovascular;  Laterality: Right;     REVIEW OF SYSTEMS:  A comprehensive review of systems was negative.   PHYSICAL EXAMINATION: General appearance: alert, cooperative, fatigued and no distress Head: Normocephalic, without obvious abnormality, atraumatic Neck: no adenopathy, no JVD, supple, symmetrical, trachea midline and thyroid not enlarged, symmetric, no tenderness/mass/nodules Lymph nodes: Cervical, supraclavicular, and axillary nodes normal. Resp: clear to auscultation bilaterally Back: symmetric, no curvature. ROM normal. No CVA tenderness. Cardio: regular rate and rhythm, S1, S2 normal, no murmur, click, rub or gallop GI: soft, non-tender; bowel sounds normal; no masses,  no organomegaly Extremities: extremities normal, atraumatic, no cyanosis or edema  ECOG PERFORMANCE STATUS: 1 - Symptomatic but completely ambulatory  Blood pressure 137/77, pulse 67, temperature 97.7 F (36.5 C), temperature source Oral, resp. rate 18, height 5\' 8"  (1.727 m), weight 148 lb 9.6 oz (67.4 kg), SpO2 100 %.  LABORATORY DATA: Lab Results  Component Value Date   WBC 4.4 12/19/2016   HGB 13.2 12/19/2016   HCT 38.6 12/19/2016   MCV 88.7 12/19/2016   PLT 165 12/19/2016      Chemistry      Component Value Date/Time   NA 138 12/19/2016 1158   K 3.7 12/19/2016 1158   CL 109 10/14/2016 1859   CO2 21 (L) 12/19/2016 1158   BUN 14.2 12/19/2016 1158   CREATININE 1.1 12/19/2016 1158      Component Value Date/Time   CALCIUM 9.4 12/19/2016 1158   ALKPHOS 65 12/19/2016 1158   AST 14 12/19/2016 1158   ALT 11 12/19/2016 1158   BILITOT 0.42 12/19/2016 1158       RADIOGRAPHIC STUDIES: Mr Jeri Cos YQ Contrast  Result Date: 11/29/2016 CLINICAL DATA:  Recent diagnosis of lung cancer, here for staging. History of hypertension, diabetes the EXAM: MRI HEAD WITHOUT AND WITH CONTRAST TECHNIQUE: Multiplanar, multiecho pulse sequences of the brain and surrounding structures were obtained without and with intravenous contrast. CONTRAST:   29mL MULTIHANCE GADOBENATE DIMEGLUMINE 529 MG/ML IV SOLN COMPARISON:  CT HEAD August 28, 2016 FINDINGS: INTRACRANIAL CONTENTS: No reduced diffusion to suggest acute ischemia or hypercellular tumor. No susceptibility artifact to suggest hemorrhage. The ventricles and sulci are normal for patient's age. No suspicious parenchymal signal, masses, mass effect. No abnormal intraparenchymal or extra-axial enhancement. Scattered subcentimeter supratentorial white matter FLAIR T2 hyperintensities are less than expected for age, consistent with mild chronic small vessel ischemic disease. Old small LEFT cerebellar infarct. No abnormal extra-axial fluid collections. No extra-axial masses. VASCULAR: Normal major intracranial vascular flow voids present at skull base. SKULL AND UPPER CERVICAL SPINE: No abnormal sellar expansion. No suspicious calvarial bone marrow signal. Craniocervical junction maintained. SINUSES/ORBITS: Trace paranasal sinus mucosal thickening. LEFT maxillary mucosal retention cyst. Trace mastoid effusions. The included ocular globes and orbital contents are non-suspicious. OTHER: None. IMPRESSION: Normal MRI of the head with and without contrast for age. Electronically Signed   By: Thana Farr.D.  On: 11/29/2016 02:28    ASSESSMENT AND PLAN: This is a very pleasant 71 years old white male with a stage IIIa non-small cell lung cancer, squamous cell carcinoma and currently undergoing a course of concurrent chemoradiation with weekly carboplatin and paclitaxel is status post 3 cycles. The patient continues to tolerated treatment well with no significant adverse effects. I recommended for him to proceed with cycle #4 today as a scheduled. For smoking cessation, strongly encouraged the patient to quit smoking. I would see him back for follow-up visit in 2 weeks for evaluation before starting cycle #6. He was advised to call immediately if he has any concerning symptoms in the interval. The patient  voices understanding of current disease status and treatment options and is in agreement with the current care plan. All questions were answered. The patient knows to call the clinic with any problems, questions or concerns. We can certainly see the patient much sooner if necessary.  I spent 10 minutes counseling the patient face to face. The total time spent in the appointment was 15 minutes.  Disclaimer: This note was dictated with voice recognition software. Similar sounding words can inadvertently be transcribed and may not be corrected upon review.

## 2016-12-19 NOTE — Patient Instructions (Signed)
[facility] Discharge Instructions for Patients Receiving Chemotherapy  Today you received the following chemotherapy agents Taxol and Carboplatin. To help prevent nausea and vomiting after your treatment, we encourage you to take your nausea medication as directed.  If you develop nausea and vomiting that is not controlled by your nausea medication, call the clinic.   BELOW ARE SYMPTOMS THAT SHOULD BE REPORTED IMMEDIATELY:  *FEVER GREATER THAN 100.5 F  *CHILLS WITH OR WITHOUT FEVER  NAUSEA AND VOMITING THAT IS NOT CONTROLLED WITH YOUR NAUSEA MEDICATION  *UNUSUAL SHORTNESS OF BREATH  *UNUSUAL BRUISING OR BLEEDING  TENDERNESS IN MOUTH AND THROAT WITH OR WITHOUT PRESENCE OF ULCERS  *URINARY PROBLEMS  *BOWEL PROBLEMS  UNUSUAL RASH Items with * indicate a potential emergency and should be followed up as soon as possible.  Feel free to call the clinic you have any questions or concerns. The clinic phone number is [phone].  Please show the CHEMO ALERT CARD at check-in to the Emergency Department and triage nurse.    

## 2016-12-20 ENCOUNTER — Ambulatory Visit
Admission: RE | Admit: 2016-12-20 | Discharge: 2016-12-20 | Disposition: A | Discharge status: Home / Self Care | Payer: Medicare HMO | Source: Ambulatory Visit | Attending: Radiation Oncology | Admitting: Radiation Oncology

## 2016-12-20 DIAGNOSIS — C342 Malignant neoplasm of middle lobe, bronchus or lung: Secondary | ICD-10-CM | POA: Diagnosis not present

## 2016-12-20 DIAGNOSIS — R079 Chest pain, unspecified: Secondary | ICD-10-CM | POA: Diagnosis not present

## 2016-12-20 DIAGNOSIS — Z510 Encounter for antineoplastic radiation therapy: Secondary | ICD-10-CM | POA: Diagnosis not present

## 2016-12-20 DIAGNOSIS — E7800 Pure hypercholesterolemia, unspecified: Secondary | ICD-10-CM | POA: Diagnosis not present

## 2016-12-20 DIAGNOSIS — C3411 Malignant neoplasm of upper lobe, right bronchus or lung: Secondary | ICD-10-CM | POA: Diagnosis not present

## 2016-12-20 DIAGNOSIS — I10 Essential (primary) hypertension: Secondary | ICD-10-CM | POA: Diagnosis not present

## 2016-12-20 DIAGNOSIS — I2510 Atherosclerotic heart disease of native coronary artery without angina pectoris: Secondary | ICD-10-CM | POA: Diagnosis not present

## 2016-12-20 DIAGNOSIS — R69 Illness, unspecified: Secondary | ICD-10-CM | POA: Diagnosis not present

## 2016-12-20 DIAGNOSIS — Z7901 Long term (current) use of anticoagulants: Secondary | ICD-10-CM | POA: Diagnosis not present

## 2016-12-20 DIAGNOSIS — E1151 Type 2 diabetes mellitus with diabetic peripheral angiopathy without gangrene: Secondary | ICD-10-CM | POA: Diagnosis not present

## 2016-12-21 ENCOUNTER — Ambulatory Visit
Admission: RE | Admit: 2016-12-21 | Discharge: 2016-12-21 | Disposition: A | Discharge status: Home / Self Care | Payer: Medicare HMO | Source: Ambulatory Visit | Attending: Radiation Oncology | Admitting: Radiation Oncology

## 2016-12-21 DIAGNOSIS — R69 Illness, unspecified: Secondary | ICD-10-CM | POA: Diagnosis not present

## 2016-12-21 DIAGNOSIS — R079 Chest pain, unspecified: Secondary | ICD-10-CM | POA: Diagnosis not present

## 2016-12-21 DIAGNOSIS — Z510 Encounter for antineoplastic radiation therapy: Secondary | ICD-10-CM | POA: Diagnosis not present

## 2016-12-21 DIAGNOSIS — C342 Malignant neoplasm of middle lobe, bronchus or lung: Secondary | ICD-10-CM | POA: Diagnosis not present

## 2016-12-21 DIAGNOSIS — Z7901 Long term (current) use of anticoagulants: Secondary | ICD-10-CM | POA: Diagnosis not present

## 2016-12-21 DIAGNOSIS — I2510 Atherosclerotic heart disease of native coronary artery without angina pectoris: Secondary | ICD-10-CM | POA: Diagnosis not present

## 2016-12-21 DIAGNOSIS — C3411 Malignant neoplasm of upper lobe, right bronchus or lung: Secondary | ICD-10-CM | POA: Diagnosis not present

## 2016-12-21 DIAGNOSIS — E7800 Pure hypercholesterolemia, unspecified: Secondary | ICD-10-CM | POA: Diagnosis not present

## 2016-12-21 DIAGNOSIS — E1151 Type 2 diabetes mellitus with diabetic peripheral angiopathy without gangrene: Secondary | ICD-10-CM | POA: Diagnosis not present

## 2016-12-21 DIAGNOSIS — I10 Essential (primary) hypertension: Secondary | ICD-10-CM | POA: Diagnosis not present

## 2016-12-22 ENCOUNTER — Ambulatory Visit
Admission: RE | Admit: 2016-12-22 | Discharge: 2016-12-22 | Disposition: A | Discharge status: Home / Self Care | Payer: Medicare HMO | Source: Ambulatory Visit | Attending: Radiation Oncology | Admitting: Radiation Oncology

## 2016-12-22 DIAGNOSIS — E1151 Type 2 diabetes mellitus with diabetic peripheral angiopathy without gangrene: Secondary | ICD-10-CM | POA: Diagnosis not present

## 2016-12-22 DIAGNOSIS — E7800 Pure hypercholesterolemia, unspecified: Secondary | ICD-10-CM | POA: Diagnosis not present

## 2016-12-22 DIAGNOSIS — C342 Malignant neoplasm of middle lobe, bronchus or lung: Secondary | ICD-10-CM | POA: Diagnosis not present

## 2016-12-22 DIAGNOSIS — R69 Illness, unspecified: Secondary | ICD-10-CM | POA: Diagnosis not present

## 2016-12-22 DIAGNOSIS — R079 Chest pain, unspecified: Secondary | ICD-10-CM | POA: Diagnosis not present

## 2016-12-22 DIAGNOSIS — C3411 Malignant neoplasm of upper lobe, right bronchus or lung: Secondary | ICD-10-CM | POA: Diagnosis not present

## 2016-12-22 DIAGNOSIS — Z510 Encounter for antineoplastic radiation therapy: Secondary | ICD-10-CM | POA: Diagnosis not present

## 2016-12-22 DIAGNOSIS — I2510 Atherosclerotic heart disease of native coronary artery without angina pectoris: Secondary | ICD-10-CM | POA: Diagnosis not present

## 2016-12-22 DIAGNOSIS — Z7901 Long term (current) use of anticoagulants: Secondary | ICD-10-CM | POA: Diagnosis not present

## 2016-12-22 DIAGNOSIS — I10 Essential (primary) hypertension: Secondary | ICD-10-CM | POA: Diagnosis not present

## 2016-12-23 ENCOUNTER — Ambulatory Visit
Admission: RE | Admit: 2016-12-23 | Discharge: 2016-12-23 | Disposition: A | Discharge status: Home / Self Care | Payer: Medicare HMO | Source: Ambulatory Visit | Attending: Radiation Oncology | Admitting: Radiation Oncology

## 2016-12-23 DIAGNOSIS — R079 Chest pain, unspecified: Secondary | ICD-10-CM | POA: Diagnosis not present

## 2016-12-23 DIAGNOSIS — R69 Illness, unspecified: Secondary | ICD-10-CM | POA: Diagnosis not present

## 2016-12-23 DIAGNOSIS — Z510 Encounter for antineoplastic radiation therapy: Secondary | ICD-10-CM | POA: Diagnosis not present

## 2016-12-23 DIAGNOSIS — C342 Malignant neoplasm of middle lobe, bronchus or lung: Secondary | ICD-10-CM | POA: Diagnosis not present

## 2016-12-23 DIAGNOSIS — E7800 Pure hypercholesterolemia, unspecified: Secondary | ICD-10-CM | POA: Diagnosis not present

## 2016-12-23 DIAGNOSIS — C3411 Malignant neoplasm of upper lobe, right bronchus or lung: Secondary | ICD-10-CM | POA: Diagnosis not present

## 2016-12-23 DIAGNOSIS — I10 Essential (primary) hypertension: Secondary | ICD-10-CM | POA: Diagnosis not present

## 2016-12-23 DIAGNOSIS — E1151 Type 2 diabetes mellitus with diabetic peripheral angiopathy without gangrene: Secondary | ICD-10-CM | POA: Diagnosis not present

## 2016-12-23 DIAGNOSIS — I2510 Atherosclerotic heart disease of native coronary artery without angina pectoris: Secondary | ICD-10-CM | POA: Diagnosis not present

## 2016-12-23 DIAGNOSIS — Z7901 Long term (current) use of anticoagulants: Secondary | ICD-10-CM | POA: Diagnosis not present

## 2016-12-26 ENCOUNTER — Ambulatory Visit (HOSPITAL_BASED_OUTPATIENT_CLINIC_OR_DEPARTMENT_OTHER): Payer: Medicare HMO

## 2016-12-26 ENCOUNTER — Ambulatory Visit
Admission: RE | Admit: 2016-12-26 | Discharge: 2016-12-26 | Disposition: A | Discharge status: Home / Self Care | Payer: Medicare HMO | Source: Ambulatory Visit | Attending: Radiation Oncology | Admitting: Radiation Oncology

## 2016-12-26 ENCOUNTER — Other Ambulatory Visit (HOSPITAL_BASED_OUTPATIENT_CLINIC_OR_DEPARTMENT_OTHER): Payer: Medicare HMO

## 2016-12-26 VITALS — BP 153/68 | HR 70 | Resp 18

## 2016-12-26 DIAGNOSIS — Z5111 Encounter for antineoplastic chemotherapy: Secondary | ICD-10-CM | POA: Diagnosis not present

## 2016-12-26 DIAGNOSIS — C342 Malignant neoplasm of middle lobe, bronchus or lung: Secondary | ICD-10-CM | POA: Diagnosis not present

## 2016-12-26 DIAGNOSIS — Z7901 Long term (current) use of anticoagulants: Secondary | ICD-10-CM | POA: Diagnosis not present

## 2016-12-26 DIAGNOSIS — C3491 Malignant neoplasm of unspecified part of right bronchus or lung: Secondary | ICD-10-CM

## 2016-12-26 DIAGNOSIS — E1151 Type 2 diabetes mellitus with diabetic peripheral angiopathy without gangrene: Secondary | ICD-10-CM | POA: Diagnosis not present

## 2016-12-26 DIAGNOSIS — I2510 Atherosclerotic heart disease of native coronary artery without angina pectoris: Secondary | ICD-10-CM | POA: Diagnosis not present

## 2016-12-26 DIAGNOSIS — R079 Chest pain, unspecified: Secondary | ICD-10-CM | POA: Diagnosis not present

## 2016-12-26 DIAGNOSIS — R69 Illness, unspecified: Secondary | ICD-10-CM | POA: Diagnosis not present

## 2016-12-26 DIAGNOSIS — I10 Essential (primary) hypertension: Secondary | ICD-10-CM | POA: Diagnosis not present

## 2016-12-26 DIAGNOSIS — C3411 Malignant neoplasm of upper lobe, right bronchus or lung: Secondary | ICD-10-CM | POA: Diagnosis not present

## 2016-12-26 DIAGNOSIS — Z510 Encounter for antineoplastic radiation therapy: Secondary | ICD-10-CM | POA: Diagnosis not present

## 2016-12-26 DIAGNOSIS — E7800 Pure hypercholesterolemia, unspecified: Secondary | ICD-10-CM | POA: Diagnosis not present

## 2016-12-26 LAB — COMPREHENSIVE METABOLIC PANEL
ALBUMIN: 3.7 g/dL (ref 3.5–5.0)
ALK PHOS: 76 U/L (ref 40–150)
ALT: 10 U/L (ref 0–55)
AST: 13 U/L (ref 5–34)
Anion Gap: 9 mEq/L (ref 3–11)
BUN: 17.2 mg/dL (ref 7.0–26.0)
CALCIUM: 9.6 mg/dL (ref 8.4–10.4)
CHLORIDE: 107 meq/L (ref 98–109)
CO2: 23 mEq/L (ref 22–29)
CREATININE: 1 mg/dL (ref 0.7–1.3)
EGFR: 75 mL/min/{1.73_m2} — ABNORMAL LOW (ref >90)
GLUCOSE: 237 mg/dL — AB (ref 70–140)
POTASSIUM: 3.7 meq/L (ref 3.5–5.1)
SODIUM: 138 meq/L (ref 136–145)
Total Bilirubin: 0.42 mg/dL (ref 0.20–1.20)
Total Protein: 6.8 g/dL (ref 6.4–8.3)

## 2016-12-26 LAB — CBC WITH DIFFERENTIAL/PLATELET
BASO%: 1.3 % (ref 0.0–2.0)
Basophils Absolute: 0 10*3/uL (ref 0.0–0.1)
EOS%: 1.5 % (ref 0.0–7.0)
Eosinophils Absolute: 0.1 10*3/uL (ref 0.0–0.5)
HEMATOCRIT: 37.1 % — AB (ref 38.4–49.9)
HEMOGLOBIN: 12.6 g/dL — AB (ref 13.0–17.1)
LYMPH#: 0.5 10*3/uL — AB (ref 0.9–3.3)
LYMPH%: 14.2 % (ref 14.0–49.0)
MCH: 30 pg (ref 27.2–33.4)
MCHC: 34.1 g/dL (ref 32.0–36.0)
MCV: 88.1 fL (ref 79.3–98.0)
MONO#: 0.3 10*3/uL (ref 0.1–0.9)
MONO%: 9.4 % (ref 0.0–14.0)
NEUT%: 73.6 % (ref 39.0–75.0)
NEUTROS ABS: 2.6 10*3/uL (ref 1.5–6.5)
Platelets: 120 10*3/uL — ABNORMAL LOW (ref 140–400)
RBC: 4.21 10*6/uL (ref 4.20–5.82)
RDW: 15 % — ABNORMAL HIGH (ref 11.0–14.6)
WBC: 3.6 10*3/uL — AB (ref 4.0–10.3)

## 2016-12-26 MED ORDER — FAMOTIDINE IN NACL 20-0.9 MG/50ML-% IV SOLN
INTRAVENOUS | Status: AC
  Filled 2016-12-26: qty 50

## 2016-12-26 MED ORDER — SODIUM CHLORIDE 0.9 % IV SOLN
45.0000 mg/m2 | Freq: Once | INTRAVENOUS | Status: AC
  Administered 2016-12-26: 78 mg via INTRAVENOUS
  Filled 2016-12-26: qty 13

## 2016-12-26 MED ORDER — PALONOSETRON HCL INJECTION 0.25 MG/5ML
INTRAVENOUS | Status: AC
  Filled 2016-12-26: qty 5

## 2016-12-26 MED ORDER — PALONOSETRON HCL INJECTION 0.25 MG/5ML
0.2500 mg | Freq: Once | INTRAVENOUS | Status: AC
  Administered 2016-12-26: 0.25 mg via INTRAVENOUS

## 2016-12-26 MED ORDER — SODIUM CHLORIDE 0.9 % IV SOLN
20.0000 mg | Freq: Once | INTRAVENOUS | Status: AC
  Administered 2016-12-26: 20 mg via INTRAVENOUS
  Filled 2016-12-26: qty 2

## 2016-12-26 MED ORDER — SODIUM CHLORIDE 0.9 % IV SOLN
170.0000 mg | Freq: Once | INTRAVENOUS | Status: AC
  Administered 2016-12-26: 170 mg via INTRAVENOUS
  Filled 2016-12-26: qty 17

## 2016-12-26 MED ORDER — SODIUM CHLORIDE 0.9 % IV SOLN
Freq: Once | INTRAVENOUS | Status: AC
  Administered 2016-12-26: 15:00:00 via INTRAVENOUS

## 2016-12-26 MED ORDER — DIPHENHYDRAMINE HCL 50 MG/ML IJ SOLN
50.0000 mg | Freq: Once | INTRAMUSCULAR | Status: AC
  Administered 2016-12-26: 50 mg via INTRAVENOUS

## 2016-12-26 MED ORDER — DIPHENHYDRAMINE HCL 50 MG/ML IJ SOLN
INTRAMUSCULAR | Status: AC
  Filled 2016-12-26: qty 1

## 2016-12-26 MED ORDER — FAMOTIDINE IN NACL 20-0.9 MG/50ML-% IV SOLN
20.0000 mg | Freq: Once | INTRAVENOUS | Status: AC
  Administered 2016-12-26: 20 mg via INTRAVENOUS

## 2016-12-26 NOTE — Patient Instructions (Signed)
[facility] Discharge Instructions for Patients Receiving Chemotherapy  Today you received the following chemotherapy agents Taxol and Carboplatin. To help prevent nausea and vomiting after your treatment, we encourage you to take your nausea medication as directed.  If you develop nausea and vomiting that is not controlled by your nausea medication, call the clinic.   BELOW ARE SYMPTOMS THAT SHOULD BE REPORTED IMMEDIATELY:  *FEVER GREATER THAN 100.5 F  *CHILLS WITH OR WITHOUT FEVER  NAUSEA AND VOMITING THAT IS NOT CONTROLLED WITH YOUR NAUSEA MEDICATION  *UNUSUAL SHORTNESS OF BREATH  *UNUSUAL BRUISING OR BLEEDING  TENDERNESS IN MOUTH AND THROAT WITH OR WITHOUT PRESENCE OF ULCERS  *URINARY PROBLEMS  *BOWEL PROBLEMS  UNUSUAL RASH Items with * indicate a potential emergency and should be followed up as soon as possible.  Feel free to call the clinic you have any questions or concerns. The clinic phone number is [phone].  Please show the CHEMO ALERT CARD at check-in to the Emergency Department and triage nurse.    

## 2016-12-27 ENCOUNTER — Ambulatory Visit
Admission: RE | Admit: 2016-12-27 | Discharge: 2016-12-27 | Disposition: A | Discharge status: Home / Self Care | Payer: Medicare HMO | Source: Ambulatory Visit | Attending: Radiation Oncology | Admitting: Radiation Oncology

## 2016-12-27 DIAGNOSIS — C342 Malignant neoplasm of middle lobe, bronchus or lung: Secondary | ICD-10-CM | POA: Diagnosis not present

## 2016-12-27 DIAGNOSIS — C3411 Malignant neoplasm of upper lobe, right bronchus or lung: Secondary | ICD-10-CM | POA: Diagnosis not present

## 2016-12-27 DIAGNOSIS — R69 Illness, unspecified: Secondary | ICD-10-CM | POA: Diagnosis not present

## 2016-12-27 DIAGNOSIS — Z7901 Long term (current) use of anticoagulants: Secondary | ICD-10-CM | POA: Diagnosis not present

## 2016-12-27 DIAGNOSIS — E7800 Pure hypercholesterolemia, unspecified: Secondary | ICD-10-CM | POA: Diagnosis not present

## 2016-12-27 DIAGNOSIS — E1151 Type 2 diabetes mellitus with diabetic peripheral angiopathy without gangrene: Secondary | ICD-10-CM | POA: Diagnosis not present

## 2016-12-27 DIAGNOSIS — I2510 Atherosclerotic heart disease of native coronary artery without angina pectoris: Secondary | ICD-10-CM | POA: Diagnosis not present

## 2016-12-27 DIAGNOSIS — I10 Essential (primary) hypertension: Secondary | ICD-10-CM | POA: Diagnosis not present

## 2016-12-27 DIAGNOSIS — R079 Chest pain, unspecified: Secondary | ICD-10-CM | POA: Diagnosis not present

## 2016-12-27 DIAGNOSIS — Z510 Encounter for antineoplastic radiation therapy: Secondary | ICD-10-CM | POA: Diagnosis not present

## 2016-12-28 ENCOUNTER — Ambulatory Visit
Admission: RE | Admit: 2016-12-28 | Discharge: 2016-12-28 | Disposition: A | Discharge status: Home / Self Care | Payer: Medicare HMO | Source: Ambulatory Visit | Attending: Radiation Oncology | Admitting: Radiation Oncology

## 2016-12-28 DIAGNOSIS — R69 Illness, unspecified: Secondary | ICD-10-CM | POA: Diagnosis not present

## 2016-12-28 DIAGNOSIS — Z7901 Long term (current) use of anticoagulants: Secondary | ICD-10-CM | POA: Diagnosis not present

## 2016-12-28 DIAGNOSIS — C342 Malignant neoplasm of middle lobe, bronchus or lung: Secondary | ICD-10-CM | POA: Diagnosis not present

## 2016-12-28 DIAGNOSIS — E1151 Type 2 diabetes mellitus with diabetic peripheral angiopathy without gangrene: Secondary | ICD-10-CM | POA: Diagnosis not present

## 2016-12-28 DIAGNOSIS — I10 Essential (primary) hypertension: Secondary | ICD-10-CM | POA: Diagnosis not present

## 2016-12-28 DIAGNOSIS — R079 Chest pain, unspecified: Secondary | ICD-10-CM | POA: Diagnosis not present

## 2016-12-28 DIAGNOSIS — E7800 Pure hypercholesterolemia, unspecified: Secondary | ICD-10-CM | POA: Diagnosis not present

## 2016-12-28 DIAGNOSIS — C3411 Malignant neoplasm of upper lobe, right bronchus or lung: Secondary | ICD-10-CM | POA: Diagnosis not present

## 2016-12-28 DIAGNOSIS — Z510 Encounter for antineoplastic radiation therapy: Secondary | ICD-10-CM | POA: Diagnosis not present

## 2016-12-28 DIAGNOSIS — I2510 Atherosclerotic heart disease of native coronary artery without angina pectoris: Secondary | ICD-10-CM | POA: Diagnosis not present

## 2016-12-29 ENCOUNTER — Ambulatory Visit
Admission: RE | Admit: 2016-12-29 | Discharge: 2016-12-29 | Disposition: A | Discharge status: Home / Self Care | Payer: Medicare HMO | Source: Ambulatory Visit | Attending: Radiation Oncology | Admitting: Radiation Oncology

## 2016-12-29 DIAGNOSIS — R079 Chest pain, unspecified: Secondary | ICD-10-CM | POA: Diagnosis not present

## 2016-12-29 DIAGNOSIS — E7800 Pure hypercholesterolemia, unspecified: Secondary | ICD-10-CM | POA: Diagnosis not present

## 2016-12-29 DIAGNOSIS — R69 Illness, unspecified: Secondary | ICD-10-CM | POA: Diagnosis not present

## 2016-12-29 DIAGNOSIS — C342 Malignant neoplasm of middle lobe, bronchus or lung: Secondary | ICD-10-CM | POA: Diagnosis not present

## 2016-12-29 DIAGNOSIS — Z7901 Long term (current) use of anticoagulants: Secondary | ICD-10-CM | POA: Diagnosis not present

## 2016-12-29 DIAGNOSIS — E1151 Type 2 diabetes mellitus with diabetic peripheral angiopathy without gangrene: Secondary | ICD-10-CM | POA: Diagnosis not present

## 2016-12-29 DIAGNOSIS — Z510 Encounter for antineoplastic radiation therapy: Secondary | ICD-10-CM | POA: Diagnosis not present

## 2016-12-29 DIAGNOSIS — I10 Essential (primary) hypertension: Secondary | ICD-10-CM | POA: Diagnosis not present

## 2016-12-29 DIAGNOSIS — I2510 Atherosclerotic heart disease of native coronary artery without angina pectoris: Secondary | ICD-10-CM | POA: Diagnosis not present

## 2016-12-29 DIAGNOSIS — C3411 Malignant neoplasm of upper lobe, right bronchus or lung: Secondary | ICD-10-CM | POA: Diagnosis not present

## 2016-12-30 ENCOUNTER — Ambulatory Visit
Admission: RE | Admit: 2016-12-30 | Discharge: 2016-12-30 | Disposition: A | Discharge status: Home / Self Care | Payer: Medicare HMO | Source: Ambulatory Visit | Attending: Radiation Oncology | Admitting: Radiation Oncology

## 2016-12-30 DIAGNOSIS — Z510 Encounter for antineoplastic radiation therapy: Secondary | ICD-10-CM | POA: Diagnosis not present

## 2016-12-30 DIAGNOSIS — C342 Malignant neoplasm of middle lobe, bronchus or lung: Secondary | ICD-10-CM | POA: Diagnosis not present

## 2016-12-30 DIAGNOSIS — R079 Chest pain, unspecified: Secondary | ICD-10-CM | POA: Diagnosis not present

## 2016-12-30 DIAGNOSIS — C3411 Malignant neoplasm of upper lobe, right bronchus or lung: Secondary | ICD-10-CM | POA: Diagnosis not present

## 2016-12-30 DIAGNOSIS — I10 Essential (primary) hypertension: Secondary | ICD-10-CM | POA: Diagnosis not present

## 2016-12-30 DIAGNOSIS — I2510 Atherosclerotic heart disease of native coronary artery without angina pectoris: Secondary | ICD-10-CM | POA: Diagnosis not present

## 2016-12-30 DIAGNOSIS — E7800 Pure hypercholesterolemia, unspecified: Secondary | ICD-10-CM | POA: Diagnosis not present

## 2016-12-30 DIAGNOSIS — R69 Illness, unspecified: Secondary | ICD-10-CM | POA: Diagnosis not present

## 2016-12-30 DIAGNOSIS — E1151 Type 2 diabetes mellitus with diabetic peripheral angiopathy without gangrene: Secondary | ICD-10-CM | POA: Diagnosis not present

## 2016-12-30 DIAGNOSIS — Z7901 Long term (current) use of anticoagulants: Secondary | ICD-10-CM | POA: Diagnosis not present

## 2017-01-02 ENCOUNTER — Other Ambulatory Visit: Payer: Medicare HMO

## 2017-01-02 ENCOUNTER — Ambulatory Visit (HOSPITAL_BASED_OUTPATIENT_CLINIC_OR_DEPARTMENT_OTHER): Payer: Medicare HMO

## 2017-01-02 ENCOUNTER — Ambulatory Visit (HOSPITAL_BASED_OUTPATIENT_CLINIC_OR_DEPARTMENT_OTHER): Payer: Medicare HMO | Admitting: Internal Medicine

## 2017-01-02 ENCOUNTER — Ambulatory Visit
Admission: RE | Admit: 2017-01-02 | Discharge: 2017-01-02 | Disposition: A | Discharge status: Home / Self Care | Payer: Medicare HMO | Source: Ambulatory Visit | Attending: Radiation Oncology | Admitting: Radiation Oncology

## 2017-01-02 ENCOUNTER — Encounter: Payer: Self-pay | Admitting: Internal Medicine

## 2017-01-02 ENCOUNTER — Telehealth: Payer: Self-pay | Admitting: Internal Medicine

## 2017-01-02 VITALS — BP 147/61 | HR 93 | Resp 18 | Ht 68.0 in | Wt 148.9 lb

## 2017-01-02 DIAGNOSIS — C342 Malignant neoplasm of middle lobe, bronchus or lung: Secondary | ICD-10-CM

## 2017-01-02 DIAGNOSIS — C3491 Malignant neoplasm of unspecified part of right bronchus or lung: Secondary | ICD-10-CM

## 2017-01-02 DIAGNOSIS — Z7901 Long term (current) use of anticoagulants: Secondary | ICD-10-CM | POA: Diagnosis not present

## 2017-01-02 DIAGNOSIS — I10 Essential (primary) hypertension: Secondary | ICD-10-CM | POA: Diagnosis not present

## 2017-01-02 DIAGNOSIS — R079 Chest pain, unspecified: Secondary | ICD-10-CM | POA: Diagnosis not present

## 2017-01-02 DIAGNOSIS — E1151 Type 2 diabetes mellitus with diabetic peripheral angiopathy without gangrene: Secondary | ICD-10-CM | POA: Diagnosis not present

## 2017-01-02 DIAGNOSIS — Z510 Encounter for antineoplastic radiation therapy: Secondary | ICD-10-CM | POA: Diagnosis not present

## 2017-01-02 DIAGNOSIS — C3411 Malignant neoplasm of upper lobe, right bronchus or lung: Secondary | ICD-10-CM | POA: Diagnosis not present

## 2017-01-02 DIAGNOSIS — Z720 Tobacco use: Secondary | ICD-10-CM

## 2017-01-02 DIAGNOSIS — Z5111 Encounter for antineoplastic chemotherapy: Secondary | ICD-10-CM

## 2017-01-02 DIAGNOSIS — Z716 Tobacco abuse counseling: Secondary | ICD-10-CM

## 2017-01-02 DIAGNOSIS — E7800 Pure hypercholesterolemia, unspecified: Secondary | ICD-10-CM | POA: Diagnosis not present

## 2017-01-02 DIAGNOSIS — I2510 Atherosclerotic heart disease of native coronary artery without angina pectoris: Secondary | ICD-10-CM | POA: Diagnosis not present

## 2017-01-02 DIAGNOSIS — R69 Illness, unspecified: Secondary | ICD-10-CM | POA: Diagnosis not present

## 2017-01-02 MED ORDER — FAMOTIDINE IN NACL 20-0.9 MG/50ML-% IV SOLN
20.0000 mg | Freq: Once | INTRAVENOUS | Status: AC
  Administered 2017-01-02: 20 mg via INTRAVENOUS

## 2017-01-02 MED ORDER — SODIUM CHLORIDE 0.9 % IV SOLN
45.0000 mg/m2 | Freq: Once | INTRAVENOUS | Status: AC
  Administered 2017-01-02: 78 mg via INTRAVENOUS
  Filled 2017-01-02: qty 13

## 2017-01-02 MED ORDER — DIPHENHYDRAMINE HCL 50 MG/ML IJ SOLN
50.0000 mg | Freq: Once | INTRAMUSCULAR | Status: AC
  Administered 2017-01-02: 50 mg via INTRAVENOUS

## 2017-01-02 MED ORDER — SODIUM CHLORIDE 0.9 % IV SOLN
20.0000 mg | Freq: Once | INTRAVENOUS | Status: AC
  Administered 2017-01-02: 20 mg via INTRAVENOUS
  Filled 2017-01-02: qty 2

## 2017-01-02 MED ORDER — PALONOSETRON HCL INJECTION 0.25 MG/5ML
INTRAVENOUS | Status: AC
  Filled 2017-01-02: qty 5

## 2017-01-02 MED ORDER — PALONOSETRON HCL INJECTION 0.25 MG/5ML
0.2500 mg | Freq: Once | INTRAVENOUS | Status: AC
  Administered 2017-01-02: 0.25 mg via INTRAVENOUS

## 2017-01-02 MED ORDER — FAMOTIDINE IN NACL 20-0.9 MG/50ML-% IV SOLN
INTRAVENOUS | Status: AC
  Filled 2017-01-02: qty 50

## 2017-01-02 MED ORDER — SODIUM CHLORIDE 0.9 % IV SOLN
Freq: Once | INTRAVENOUS | Status: AC
  Administered 2017-01-02: 16:00:00 via INTRAVENOUS

## 2017-01-02 MED ORDER — DIPHENHYDRAMINE HCL 50 MG/ML IJ SOLN
INTRAMUSCULAR | Status: AC
  Filled 2017-01-02: qty 1

## 2017-01-02 MED ORDER — CARBOPLATIN CHEMO INJECTION 450 MG/45ML
165.2000 mg | Freq: Once | INTRAVENOUS | Status: AC
  Administered 2017-01-02: 170 mg via INTRAVENOUS
  Filled 2017-01-02: qty 17

## 2017-01-02 NOTE — Progress Notes (Signed)
[place] [phone]   [phone]  OFFICE PROGRESS NOTE  [name], MD [address] [place] [address]  DIAGNOSIS: Stage IIIA (T1a, N2, M0) non-small cell lung cancer, squamous cell carcinoma presented with right middle lobe pulmonary nodule in addition to ipsilateral hilar and ipsilateral mediastinal lymphadenopathy diagnosed in June 2018.  PRIOR THERAPY: None.  CURRENT THERAPY: A course of concurrent chemoradiation with weekly carboplatin for AUC of 2 and paclitaxel 45 MG/M2. Status post 5 cycles.  INTERVAL HISTORY: Matthew Wilkins 71 y.o. male returns to the clinic today for follow-up visit accompanied by his wife. The patient is feeling fine today with no specific complaints. He was tolerating this course of concurrent chemoradiation fairly well. He denied having nausea, vomiting, diarrhea or constipation. He has no chest pain but continues to have shortness of breath at baseline and increased with exertion. He has no fever or chills. He denied having any significant weight loss or night sweats. The patient is here today for evaluation before starting cycle #6.  MEDICAL HISTORY: Past Medical History:  Diagnosis Date  . Adenocarcinoma of right lung, stage 3 ([place]) 11/17/2016  . Anxiety   . Arthritis   . Coronary artery disease   . Diabetes mellitus   . DVT (deep venous thrombosis) ([place])   . Encounter for antineoplastic chemotherapy 11/17/2016  . Hypercholesterolemia   . Hypertension   . PAD (peripheral artery disease) ([place])   . SOB (shortness of breath)     ALLERGIES:  is allergic to aspirin.  MEDICATIONS:  Current Outpatient Prescriptions  Medication Sig Dispense Refill  . apixaban (ELIQUIS) 5 MG TABS tablet Take 10mg  po bid for 7 days then 5mg  po bid (Patient taking differently: 5 mg 2 (two) times daily. Take 10mg  po bid for 7 days then 5mg  po bid) 60 tablet 1  . gabapentin (NEURONTIN) 300 MG capsule Take 1 capsule by  mouth 3 (three) times daily.    [name] glipiZIDE (GLUCOTROL XL) 5 MG 24 hr tablet Take 10 mg by mouth 2 (two) times daily.     [name] levothyroxine (SYNTHROID) 25 MCG tablet Take 1 tablet (25 mcg total) by mouth daily before breakfast. 30 tablet 11  . lisinopril (PRINIVIL,ZESTRIL) 5 MG tablet Take 5 mg by mouth daily.    . metFORMIN (GLUCOPHAGE-XR) 500 MG 24 hr tablet Take 2 tablets by mouth 2 (two) times daily.    . pantoprazole (PROTONIX) 40 MG tablet Take 40 mg by mouth 2 (two) times daily.     . prochlorperazine (COMPAZINE) 10 MG tablet Take 1 tablet (10 mg total) by mouth every 6 (six) hours as needed for nausea or vomiting. 30 tablet 0  . rOPINIRole (REQUIP) 3 MG tablet Take 3 mg by mouth 4 (four) times daily as needed (Restless Leg).     . rosuvastatin (CRESTOR) 20 MG tablet Take 1 tablet (20 mg total) by mouth daily. 30 tablet 11  . sertraline (ZOLOFT) 25 MG tablet     . tiotropium (SPIRIVA) 18 MCG inhalation capsule Place 1 capsule into inhaler and inhale daily.    . VENTOLIN HFA 108 (90 Base) MCG/ACT inhaler     . ZETIA 10 MG tablet Take 10 mg by mouth daily.     No current facility-administered medications for this visit.     SURGICAL HISTORY:  Past Surgical History:  Procedure Laterality Date  . CARDIAC CATHETERIZATION N/A 06/10/2015   Procedure: Left Heart Cath and  Coronary Angiography;  Surgeon: Burnell Blanks, MD;  Location: [place] CV LAB;  Service: Cardiovascular;  Laterality: N/A;  . LOWER EXTREMITY ANGIOGRAM N/A 07/13/2011   Procedure: LOWER EXTREMITY ANGIOGRAM;  Surgeon: Burnell Blanks, MD;  Location: [facility] CATH LAB;  Service: Cardiovascular;  Laterality: N/A;  . OTHER SURGICAL HISTORY    . OTHER SURGICAL HISTORY    . OTHER SURGICAL HISTORY    . PERCUTANEOUS STENT INTERVENTION Right 07/13/2011   Procedure: PERCUTANEOUS STENT INTERVENTION;  Surgeon: Burnell Blanks, MD;  Location: [facility] CATH LAB;  Service: Cardiovascular;  Laterality: Right;    REVIEW OF  SYSTEMS:  A comprehensive review of systems was negative except for: Constitutional: positive for fatigue Respiratory: positive for dyspnea on exertion   PHYSICAL EXAMINATION: General appearance: alert, cooperative, fatigued and no distress Head: Normocephalic, without obvious abnormality, atraumatic Neck: no adenopathy, no JVD, supple, symmetrical, trachea midline and thyroid not enlarged, symmetric, no tenderness/mass/nodules Lymph nodes: Cervical, supraclavicular, and axillary nodes normal. Resp: clear to auscultation bilaterally Back: symmetric, no curvature. ROM normal. No CVA tenderness. Cardio: regular rate and rhythm, S1, S2 normal, no murmur, click, rub or gallop GI: soft, non-tender; bowel sounds normal; no masses,  no organomegaly Extremities: extremities normal, atraumatic, no cyanosis or edema  ECOG PERFORMANCE STATUS: 1 - Symptomatic but completely ambulatory  Blood pressure (!) 147/61, pulse 93, resp. rate 18, height 5\' 8"  (1.727 m), weight 148 lb 14.4 oz (67.5 kg), SpO2 100 %.  LABORATORY DATA: Lab Results  Component Value Date   WBC 3.6 (L) 12/26/2016   HGB 12.6 (L) 12/26/2016   HCT 37.1 (L) 12/26/2016   MCV 88.1 12/26/2016   PLT 120 (L) 12/26/2016      Chemistry      Component Value Date/Time   NA 138 12/26/2016 1218   K 3.7 12/26/2016 1218   CL 109 10/14/2016 1859   CO2 23 12/26/2016 1218   BUN 17.2 12/26/2016 1218   CREATININE 1.0 12/26/2016 1218      Component Value Date/Time   CALCIUM 9.6 12/26/2016 1218   ALKPHOS 76 12/26/2016 1218   AST 13 12/26/2016 1218   ALT 10 12/26/2016 1218   BILITOT 0.42 12/26/2016 1218       RADIOGRAPHIC STUDIES: No results found.  ASSESSMENT AND PLAN: This is a very pleasant 71 years old white male with a stage IIIa non-small cell lung cancer, squamous cell carcinoma and currently undergoing a course of concurrent chemoradiation with weekly carboplatin and paclitaxel status post 5 cycles and tolerating his treatment  fairly well. I recommended for the patient to proceed to cycle #6 today as a scheduled. He is expected to complete the course of concurrent radiotherapy on 01/06/2017. I will see him back for follow-up visit in one month's for evaluation after repeating CT scan of the chest for restaging of his disease. For smoking cessation, I strongly recommended for the patient to quit smoking and offered him a smoking cessation program. He was advised to call immediately if he has any concerning symptoms in the interval. The patient voices understanding of current disease status and treatment options and is in agreement with the current care plan. All questions were answered. The patient knows to call the clinic with any problems, questions or concerns. We can certainly see the patient much sooner if necessary.  I spent 10 minutes counseling the patient face to face. The total time spent in the appointment was 15 minutes.  Disclaimer: This note was dictated with voice recognition  software. Similar sounding words can inadvertently be transcribed and may not be corrected upon review.

## 2017-01-02 NOTE — Progress Notes (Signed)
Ok to use labs from last week per MD.

## 2017-01-02 NOTE — Telephone Encounter (Signed)
Gave pt avs and calendar for upcoming appts.

## 2017-01-03 ENCOUNTER — Ambulatory Visit
Admission: RE | Admit: 2017-01-03 | Discharge: 2017-01-03 | Disposition: A | Discharge status: Home / Self Care | Payer: Medicare HMO | Source: Ambulatory Visit | Attending: Radiation Oncology | Admitting: Radiation Oncology

## 2017-01-03 DIAGNOSIS — C342 Malignant neoplasm of middle lobe, bronchus or lung: Secondary | ICD-10-CM | POA: Diagnosis not present

## 2017-01-03 DIAGNOSIS — C3411 Malignant neoplasm of upper lobe, right bronchus or lung: Secondary | ICD-10-CM | POA: Diagnosis not present

## 2017-01-03 DIAGNOSIS — I2510 Atherosclerotic heart disease of native coronary artery without angina pectoris: Secondary | ICD-10-CM | POA: Diagnosis not present

## 2017-01-03 DIAGNOSIS — E1151 Type 2 diabetes mellitus with diabetic peripheral angiopathy without gangrene: Secondary | ICD-10-CM | POA: Diagnosis not present

## 2017-01-03 DIAGNOSIS — E7800 Pure hypercholesterolemia, unspecified: Secondary | ICD-10-CM | POA: Diagnosis not present

## 2017-01-03 DIAGNOSIS — R079 Chest pain, unspecified: Secondary | ICD-10-CM | POA: Diagnosis not present

## 2017-01-03 DIAGNOSIS — Z510 Encounter for antineoplastic radiation therapy: Secondary | ICD-10-CM | POA: Diagnosis not present

## 2017-01-03 DIAGNOSIS — I10 Essential (primary) hypertension: Secondary | ICD-10-CM | POA: Diagnosis not present

## 2017-01-03 DIAGNOSIS — R69 Illness, unspecified: Secondary | ICD-10-CM | POA: Diagnosis not present

## 2017-01-03 DIAGNOSIS — Z7901 Long term (current) use of anticoagulants: Secondary | ICD-10-CM | POA: Diagnosis not present

## 2017-01-04 ENCOUNTER — Ambulatory Visit
Admission: RE | Admit: 2017-01-04 | Discharge: 2017-01-04 | Disposition: A | Discharge status: Home / Self Care | Payer: Medicare HMO | Source: Ambulatory Visit | Attending: Radiation Oncology | Admitting: Radiation Oncology

## 2017-01-04 DIAGNOSIS — E7800 Pure hypercholesterolemia, unspecified: Secondary | ICD-10-CM | POA: Diagnosis not present

## 2017-01-04 DIAGNOSIS — I2510 Atherosclerotic heart disease of native coronary artery without angina pectoris: Secondary | ICD-10-CM | POA: Diagnosis not present

## 2017-01-04 DIAGNOSIS — I10 Essential (primary) hypertension: Secondary | ICD-10-CM | POA: Diagnosis not present

## 2017-01-04 DIAGNOSIS — Z7901 Long term (current) use of anticoagulants: Secondary | ICD-10-CM | POA: Diagnosis not present

## 2017-01-04 DIAGNOSIS — Z510 Encounter for antineoplastic radiation therapy: Secondary | ICD-10-CM | POA: Diagnosis not present

## 2017-01-04 DIAGNOSIS — R69 Illness, unspecified: Secondary | ICD-10-CM | POA: Diagnosis not present

## 2017-01-04 DIAGNOSIS — R079 Chest pain, unspecified: Secondary | ICD-10-CM | POA: Diagnosis not present

## 2017-01-04 DIAGNOSIS — C342 Malignant neoplasm of middle lobe, bronchus or lung: Secondary | ICD-10-CM | POA: Diagnosis not present

## 2017-01-04 DIAGNOSIS — E1151 Type 2 diabetes mellitus with diabetic peripheral angiopathy without gangrene: Secondary | ICD-10-CM | POA: Diagnosis not present

## 2017-01-04 DIAGNOSIS — C3411 Malignant neoplasm of upper lobe, right bronchus or lung: Secondary | ICD-10-CM | POA: Diagnosis not present

## 2017-01-05 ENCOUNTER — Ambulatory Visit
Admission: RE | Admit: 2017-01-05 | Discharge: 2017-01-05 | Disposition: A | Discharge status: Home / Self Care | Payer: Medicare HMO | Source: Ambulatory Visit | Attending: Radiation Oncology | Admitting: Radiation Oncology

## 2017-01-05 DIAGNOSIS — E7800 Pure hypercholesterolemia, unspecified: Secondary | ICD-10-CM | POA: Diagnosis not present

## 2017-01-05 DIAGNOSIS — Z510 Encounter for antineoplastic radiation therapy: Secondary | ICD-10-CM | POA: Diagnosis not present

## 2017-01-05 DIAGNOSIS — Z7901 Long term (current) use of anticoagulants: Secondary | ICD-10-CM | POA: Diagnosis not present

## 2017-01-05 DIAGNOSIS — E1151 Type 2 diabetes mellitus with diabetic peripheral angiopathy without gangrene: Secondary | ICD-10-CM | POA: Diagnosis not present

## 2017-01-05 DIAGNOSIS — R079 Chest pain, unspecified: Secondary | ICD-10-CM | POA: Diagnosis not present

## 2017-01-05 DIAGNOSIS — I10 Essential (primary) hypertension: Secondary | ICD-10-CM | POA: Diagnosis not present

## 2017-01-05 DIAGNOSIS — I2510 Atherosclerotic heart disease of native coronary artery without angina pectoris: Secondary | ICD-10-CM | POA: Diagnosis not present

## 2017-01-05 DIAGNOSIS — C342 Malignant neoplasm of middle lobe, bronchus or lung: Secondary | ICD-10-CM | POA: Diagnosis not present

## 2017-01-05 DIAGNOSIS — R69 Illness, unspecified: Secondary | ICD-10-CM | POA: Diagnosis not present

## 2017-01-05 DIAGNOSIS — C3411 Malignant neoplasm of upper lobe, right bronchus or lung: Secondary | ICD-10-CM | POA: Diagnosis not present

## 2017-01-06 ENCOUNTER — Ambulatory Visit
Admission: RE | Admit: 2017-01-06 | Discharge: 2017-01-06 | Disposition: A | Discharge status: Home / Self Care | Payer: Medicare HMO | Source: Ambulatory Visit | Attending: Radiation Oncology | Admitting: Radiation Oncology

## 2017-01-06 ENCOUNTER — Encounter: Payer: Self-pay | Admitting: Radiation Oncology

## 2017-01-06 DIAGNOSIS — E7800 Pure hypercholesterolemia, unspecified: Secondary | ICD-10-CM | POA: Diagnosis not present

## 2017-01-06 DIAGNOSIS — Z7901 Long term (current) use of anticoagulants: Secondary | ICD-10-CM | POA: Diagnosis not present

## 2017-01-06 DIAGNOSIS — I10 Essential (primary) hypertension: Secondary | ICD-10-CM | POA: Diagnosis not present

## 2017-01-06 DIAGNOSIS — C342 Malignant neoplasm of middle lobe, bronchus or lung: Secondary | ICD-10-CM | POA: Diagnosis not present

## 2017-01-06 DIAGNOSIS — I2510 Atherosclerotic heart disease of native coronary artery without angina pectoris: Secondary | ICD-10-CM | POA: Diagnosis not present

## 2017-01-06 DIAGNOSIS — C3411 Malignant neoplasm of upper lobe, right bronchus or lung: Secondary | ICD-10-CM | POA: Diagnosis not present

## 2017-01-06 DIAGNOSIS — E1151 Type 2 diabetes mellitus with diabetic peripheral angiopathy without gangrene: Secondary | ICD-10-CM | POA: Diagnosis not present

## 2017-01-06 DIAGNOSIS — Z510 Encounter for antineoplastic radiation therapy: Secondary | ICD-10-CM | POA: Diagnosis not present

## 2017-01-06 DIAGNOSIS — R079 Chest pain, unspecified: Secondary | ICD-10-CM | POA: Diagnosis not present

## 2017-01-06 DIAGNOSIS — R69 Illness, unspecified: Secondary | ICD-10-CM | POA: Diagnosis not present

## 2017-01-09 ENCOUNTER — Other Ambulatory Visit: Payer: Medicare HMO

## 2017-01-09 ENCOUNTER — Ambulatory Visit: Payer: Medicare HMO

## 2017-01-09 NOTE — Progress Notes (Signed)
  Radiation Oncology         [phone] ________________________________  Name: Matthew Wilkins MRN: [id number]  Date: 01/06/2017  DOB: 1946-02-08  End of Treatment Note  Diagnosis:   Stage IIIA (T1a N2 M0) non-small cell lung cancer presenting in the right lung.     Indication for treatment:  Definitive       Radiation treatment dates:   11/28/2016 to 01/06/2017  Site/dose:   The Right lung was treated to 60 Gy in 30 fractions at 2 Gy per fraction.   Beams/energy:   3-D // 10X, 6X  Narrative: The patient tolerated radiation treatment along with radiosensitizing chemotherapy relatively well.   He denies fatigue, shortness of breath, sore throat, or trouble swallowing. He does report having a dry cough.   Plan: The patient has completed radiation treatment. The patient will return to radiation oncology clinic for routine followup in one month. I advised them to call or return sooner if they have any questions or concerns related to their recovery or treatment.  -----------------------------------  Blair Promise, PhD, MD  This document serves as a record of services personally performed by Gery Pray, MD. It was created on his behalf by Arlyce Harman, a trained medical scribe. The creation of this record is based on the scribe's personal observations and the provider's statements to them. This document has been checked and approved by the attending provider.

## 2017-02-03 ENCOUNTER — Other Ambulatory Visit (HOSPITAL_BASED_OUTPATIENT_CLINIC_OR_DEPARTMENT_OTHER): Payer: Medicare HMO

## 2017-02-03 ENCOUNTER — Ambulatory Visit (HOSPITAL_COMMUNITY)
Admission: RE | Admit: 2017-02-03 | Discharge: 2017-02-03 | Disposition: A | Discharge status: Home / Self Care | Payer: Medicare HMO | Source: Ambulatory Visit | Attending: Internal Medicine | Admitting: Internal Medicine

## 2017-02-03 ENCOUNTER — Encounter (HOSPITAL_COMMUNITY): Payer: Self-pay

## 2017-02-03 DIAGNOSIS — C3491 Malignant neoplasm of unspecified part of right bronchus or lung: Secondary | ICD-10-CM | POA: Diagnosis present

## 2017-02-03 DIAGNOSIS — Z716 Tobacco abuse counseling: Secondary | ICD-10-CM

## 2017-02-03 DIAGNOSIS — Z720 Tobacco use: Secondary | ICD-10-CM | POA: Diagnosis not present

## 2017-02-03 DIAGNOSIS — C342 Malignant neoplasm of middle lobe, bronchus or lung: Secondary | ICD-10-CM | POA: Diagnosis not present

## 2017-02-03 DIAGNOSIS — R918 Other nonspecific abnormal finding of lung field: Secondary | ICD-10-CM | POA: Diagnosis not present

## 2017-02-03 DIAGNOSIS — Z5111 Encounter for antineoplastic chemotherapy: Secondary | ICD-10-CM

## 2017-02-03 LAB — COMPREHENSIVE METABOLIC PANEL
ALT: 12 U/L (ref 0–55)
ANION GAP: 9 meq/L (ref 3–11)
AST: 18 U/L (ref 5–34)
Albumin: 3.8 g/dL (ref 3.5–5.0)
Alkaline Phosphatase: 75 U/L (ref 40–150)
BUN: 12.7 mg/dL (ref 7.0–26.0)
CHLORIDE: 106 meq/L (ref 98–109)
CO2: 22 meq/L (ref 22–29)
CREATININE: 0.9 mg/dL (ref 0.7–1.3)
Calcium: 9.4 mg/dL (ref 8.4–10.4)
EGFR: 82 mL/min/{1.73_m2} — ABNORMAL LOW (ref >90)
Glucose: 112 mg/dl (ref 70–140)
POTASSIUM: 4 meq/L (ref 3.5–5.1)
Sodium: 136 mEq/L (ref 136–145)
Total Bilirubin: 0.58 mg/dL (ref 0.20–1.20)
Total Protein: 7 g/dL (ref 6.4–8.3)

## 2017-02-03 LAB — CBC WITH DIFFERENTIAL/PLATELET
BASO%: 0.7 % (ref 0.0–2.0)
Basophils Absolute: 0 10*3/uL (ref 0.0–0.1)
EOS%: 1.7 % (ref 0.0–7.0)
Eosinophils Absolute: 0.1 10*3/uL (ref 0.0–0.5)
HCT: 36.3 % — ABNORMAL LOW (ref 38.4–49.9)
HGB: 12.5 g/dL — ABNORMAL LOW (ref 13.0–17.1)
LYMPH#: 0.8 10*3/uL — AB (ref 0.9–3.3)
LYMPH%: 13 % — AB (ref 14.0–49.0)
MCH: 31.7 pg (ref 27.2–33.4)
MCHC: 34.4 g/dL (ref 32.0–36.0)
MCV: 92.1 fL (ref 79.3–98.0)
MONO#: 0.5 10*3/uL (ref 0.1–0.9)
MONO%: 8.5 % (ref 0.0–14.0)
NEUT#: 4.4 10*3/uL (ref 1.5–6.5)
NEUT%: 76.1 % — AB (ref 39.0–75.0)
Platelets: 186 10*3/uL (ref 140–400)
RBC: 3.94 10*6/uL — AB (ref 4.20–5.82)
RDW: 17.4 % — ABNORMAL HIGH (ref 11.0–14.6)
WBC: 5.8 10*3/uL (ref 4.0–10.3)

## 2017-02-03 MED ORDER — IOPAMIDOL (ISOVUE-300) INJECTION 61%
75.0000 mL | Freq: Once | INTRAVENOUS | Status: AC | PRN
  Administered 2017-02-03: 75 mL via INTRAVENOUS

## 2017-02-03 MED ORDER — IOPAMIDOL (ISOVUE-300) INJECTION 61%
INTRAVENOUS | Status: AC
  Filled 2017-02-03: qty 75

## 2017-02-06 ENCOUNTER — Ambulatory Visit: Payer: Self-pay | Admitting: Radiation Oncology

## 2017-02-07 ENCOUNTER — Telehealth: Payer: Self-pay | Admitting: Internal Medicine

## 2017-02-07 ENCOUNTER — Encounter: Payer: Self-pay | Admitting: Oncology

## 2017-02-07 ENCOUNTER — Encounter: Payer: Self-pay | Admitting: Internal Medicine

## 2017-02-07 ENCOUNTER — Ambulatory Visit
Admission: RE | Admit: 2017-02-07 | Discharge: 2017-02-07 | Disposition: A | Discharge status: Home / Self Care | Payer: Medicare HMO | Source: Ambulatory Visit | Attending: Radiation Oncology | Admitting: Radiation Oncology

## 2017-02-07 ENCOUNTER — Ambulatory Visit (HOSPITAL_BASED_OUTPATIENT_CLINIC_OR_DEPARTMENT_OTHER): Payer: Medicare HMO | Admitting: Internal Medicine

## 2017-02-07 VITALS — BP 143/61 | HR 66 | Temp 98.1°F | Resp 20 | Wt 155.4 lb

## 2017-02-07 VITALS — BP 137/49 | HR 67 | Temp 98.0°F | Resp 18 | Ht 68.0 in | Wt 155.2 lb

## 2017-02-07 DIAGNOSIS — R5382 Chronic fatigue, unspecified: Secondary | ICD-10-CM

## 2017-02-07 DIAGNOSIS — C342 Malignant neoplasm of middle lobe, bronchus or lung: Secondary | ICD-10-CM | POA: Diagnosis not present

## 2017-02-07 DIAGNOSIS — Z79899 Other long term (current) drug therapy: Secondary | ICD-10-CM | POA: Insufficient documentation

## 2017-02-07 DIAGNOSIS — C3491 Malignant neoplasm of unspecified part of right bronchus or lung: Secondary | ICD-10-CM | POA: Diagnosis not present

## 2017-02-07 DIAGNOSIS — Z7901 Long term (current) use of anticoagulants: Secondary | ICD-10-CM | POA: Diagnosis not present

## 2017-02-07 DIAGNOSIS — Z923 Personal history of irradiation: Secondary | ICD-10-CM | POA: Insufficient documentation

## 2017-02-07 DIAGNOSIS — Z7984 Long term (current) use of oral hypoglycemic drugs: Secondary | ICD-10-CM | POA: Insufficient documentation

## 2017-02-07 DIAGNOSIS — Z79890 Hormone replacement therapy (postmenopausal): Secondary | ICD-10-CM | POA: Diagnosis not present

## 2017-02-07 DIAGNOSIS — Z886 Allergy status to analgesic agent status: Secondary | ICD-10-CM | POA: Diagnosis not present

## 2017-02-07 HISTORY — DX: Personal history of irradiation: Z92.3

## 2017-02-07 NOTE — Progress Notes (Signed)
DISCONTINUE ON PATHWAY REGIMEN - Non-Small Cell Lung     Administer weekly:     Paclitaxel      Carboplatin   **Always confirm dose/schedule in your pharmacy ordering system**    REASON: Continuation Of Treatment PRIOR TREATMENT: BEE100: Carboplatin AUC=2 + Paclitaxel 45 mg/m2 Weekly During Radiation TREATMENT RESPONSE: Partial Response (PR)  START ON PATHWAY REGIMEN - Non-Small Cell Lung     A cycle is every 14 days:     Durvalumab   **Always confirm dose/schedule in your pharmacy ordering system**    Patient Characteristics: Stage III - Unresectable, PS = 0, 1 AJCC T Category: T1b Current Disease Status: No Distant Mets or Local Recurrence AJCC N Category: N2 AJCC M Category: M0 AJCC 8 Stage Grouping: IIIA Performance Status: PS = 0, 1 Intent of Therapy: Curative Intent, Discussed with Patient

## 2017-02-07 NOTE — Progress Notes (Signed)
[facility] Telephone:(336) [phone]   Fax:(336) [phone]  OFFICE PROGRESS NOTE  [name], MD [address] [place] [address]  DIAGNOSIS: Stage IIIA (T1a, N2, M0) non-small cell lung cancer, squamous cell carcinoma presented with right middle lobe pulmonary nodule in addition to ipsilateral hilar and ipsilateral mediastinal lymphadenopathy diagnosed in June 2018.  PRIOR THERAPY:  A course of concurrent chemoradiation with weekly carboplatin for AUC of 2 and paclitaxel 45 MG/M2. Status post 7 cycles.  CURRENT THERAPY: Consolidation immunotherapy with Imfinzi (Durvalumab) 10 MG/KG every 2 weeks.first dose 02/22/2017.  INTERVAL HISTORY: Matthew Wilkins 71 y.o. male returns to the clinic today for follow-up visit accompanied by his wife. The patient has no complaints today. He denied having any chest pain but continues to have shortness of breath with exertion with mild cough and no hemoptysis. He continues to smoke at regular basis and not willing to quit. He denied having any weight loss or night sweats. He has no nausea, vomiting, diarrhea or constipation. He tolerated the previous course of concurrent chemoradiation fairly well. The patient had repeat CT scan of the chest performed recently and he is here for evaluation and discussion of his scan results.  MEDICAL HISTORY: Past Medical History:  Diagnosis Date  . Adenocarcinoma of right lung, stage 3 ([facility]) 11/17/2016  . Anxiety   . Arthritis   . Coronary artery disease   . Diabetes mellitus   . DVT (deep venous thrombosis) ([place])   . Encounter for antineoplastic chemotherapy 11/17/2016  . History of radiation therapy 11/28/16-01/06/17   right lung was treated to 60 Gy in 30 fractions  . Hypercholesterolemia   . Hypertension   . PAD (peripheral artery disease) ([facility] [place])   . SOB (shortness of breath)     ALLERGIES:  is allergic to aspirin.  MEDICATIONS:  Current Outpatient Prescriptions  Medication Sig  Dispense Refill  . apixaban (ELIQUIS) 5 MG TABS tablet Take 10mg  po bid for 7 days then 5mg  po bid (Patient taking differently: 5 mg 2 (two) times daily. Take 10mg  po bid for 7 days then 5mg  po bid) 60 tablet 1  . gabapentin (NEURONTIN) 300 MG capsule Take 1 capsule by mouth 3 (three) times daily.    [name] glipiZIDE (GLUCOTROL XL) 5 MG 24 hr tablet Take 10 mg by mouth 2 (two) times daily.     [name] levothyroxine (SYNTHROID) 25 MCG tablet Take 1 tablet (25 mcg total) by mouth daily before breakfast. 30 tablet 11  . lisinopril (PRINIVIL,ZESTRIL) 5 MG tablet Take 5 mg by mouth daily.    . metFORMIN (GLUCOPHAGE-XR) 500 MG 24 hr tablet Take 2 tablets by mouth 2 (two) times daily.    . pantoprazole (PROTONIX) 40 MG tablet Take 40 mg by mouth 2 (two) times daily.     . prochlorperazine (COMPAZINE) 10 MG tablet Take 1 tablet (10 mg total) by mouth every 6 (six) hours as needed for nausea or vomiting. 30 tablet 0  . rOPINIRole (REQUIP) 3 MG tablet Take 3 mg by mouth 4 (four) times daily as needed (Restless Leg).     . rosuvastatin (CRESTOR) 20 MG tablet Take 1 tablet (20 mg total) by mouth daily. 30 tablet 11  . sertraline (ZOLOFT) 25 MG tablet     . tiotropium (SPIRIVA) 18 MCG inhalation capsule Place 1 capsule into inhaler and inhale daily.    . VENTOLIN HFA 108 (90 Base) MCG/ACT inhaler     . ZETIA  10 MG tablet Take 10 mg by mouth daily.     No current facility-administered medications for this visit.     SURGICAL HISTORY:  Past Surgical History:  Procedure Laterality Date  . CARDIAC CATHETERIZATION N/A 06/10/2015   Procedure: Left Heart Cath and Coronary Angiography;  Surgeon: Burnell Blanks, MD;  Location: [facility];  Service: Cardiovascular;  Laterality: N/A;  . LOWER EXTREMITY ANGIOGRAM N/A 07/13/2011   Procedure: LOWER EXTREMITY ANGIOGRAM;  Surgeon: Burnell Blanks, MD;  Location: [facility] CATH LAB;  Service: Cardiovascular;  Laterality: N/A;  . OTHER SURGICAL HISTORY    . OTHER  SURGICAL HISTORY    . OTHER SURGICAL HISTORY    . PERCUTANEOUS STENT INTERVENTION Right 07/13/2011   Procedure: PERCUTANEOUS STENT INTERVENTION;  Surgeon: Burnell Blanks, MD;  Location: [facility] CATH LAB;  Service: Cardiovascular;  Laterality: Right;    REVIEW OF SYSTEMS:  Constitutional: negative Eyes: negative Ears, nose, mouth, throat, and face: negative Respiratory: positive for cough and dyspnea on exertion Cardiovascular: negative Gastrointestinal: negative Genitourinary:negative Integument/breast: negative Hematologic/lymphatic: negative Musculoskeletal:negative Neurological: negative Behavioral/Psych: negative Endocrine: negative Allergic/Immunologic: negative   PHYSICAL EXAMINATION: General appearance: alert, cooperative and no distress Head: Normocephalic, without obvious abnormality, atraumatic Neck: no adenopathy, no JVD, supple, symmetrical, trachea midline and thyroid not enlarged, symmetric, no tenderness/mass/nodules Lymph nodes: Cervical, supraclavicular, and axillary nodes normal. Resp: clear to auscultation bilaterally Back: symmetric, no curvature. ROM normal. No CVA tenderness. Cardio: regular rate and rhythm, S1, S2 normal, no murmur, click, rub or gallop GI: soft, non-tender; bowel sounds normal; no masses,  no organomegaly Extremities: extremities normal, atraumatic, no cyanosis or edema Neurologic: Alert and oriented X 3, normal strength and tone. Normal symmetric reflexes. Normal coordination and gait  ECOG PERFORMANCE STATUS: 1 - Symptomatic but completely ambulatory  Blood pressure (!) 137/49, pulse 67, temperature 98 F (36.7 C), temperature source Oral, resp. rate 18, height 5\' 8"  (1.727 m), weight 155 lb 3.2 oz (70.4 kg), SpO2 100 %.  LABORATORY DATA: Lab Results  Component Value Date   WBC 5.8 02/03/2017   HGB 12.5 (L) 02/03/2017   HCT 36.3 (L) 02/03/2017   MCV 92.1 02/03/2017   PLT 186 02/03/2017      Chemistry      Component Value  Date/Time   NA 136 02/03/2017 1258   K 4.0 02/03/2017 1258   CL 109 10/14/2016 1859   CO2 22 02/03/2017 1258   BUN 12.7 02/03/2017 1258   CREATININE 0.9 02/03/2017 1258      Component Value Date/Time   CALCIUM 9.4 02/03/2017 1258   ALKPHOS 75 02/03/2017 1258   AST 18 02/03/2017 1258   ALT 12 02/03/2017 1258   BILITOT 0.58 02/03/2017 1258       RADIOGRAPHIC STUDIES: Ct Chest W Contrast  Result Date: 02/04/2017 CLINICAL DATA:  Lung cancer diagnosed 2018. Chemotherapy radiation therapy complete. EXAM: CT CHEST WITH CONTRAST TECHNIQUE: Multidetector CT imaging of the chest was performed during intravenous contrast administration. CONTRAST:  31mL ISOVUE-300 IOPAMIDOL (ISOVUE-300) INJECTION 61% COMPARISON:  11/04/2016 FINDINGS: Cardiovascular: Coronary artery calcification and aortic atherosclerotic calcification. Mediastinum/Nodes: No axillary supraclavicular adenopathy. No mediastinal hilar adenopathy. Esophagus normal. Lungs/Pleura: 8 mm nodule in the RIGHT middle lobe decreased from 13 mm (image 95, series 5). Two less defined nodules in the upper lobe on image 45 and 52 of series 5 are new from prior but may be infectious or inflammatory. Upper Abdomen: Limited view of the liver, kidneys, pancreas are unremarkable. Normal adrenal glands. Musculoskeletal:  No aggressive osseous lesion IMPRESSION: 1. Interval decrease in size of RIGHT middle lobe nodule. 2. Two small nodules in the RIGHT upper lobe are new from prior but may well represent small foci of infection or inflammation. Recommend attention on follow-up 3. No mediastinal or hilar lymphadenopathy identified. Electronically Signed   By: Suzy Bouchard M.D.   On: 02/04/2017 11:48    ASSESSMENT AND PLAN: This is a very pleasant 71 years old white male with a stage IIIa non-small cell lung cancer, squamous cell carcinoma  The patient underwent a course of concurrent chemoradiation with weekly carboplatin and paclitaxel status post 7 cycles  and tolerated his treatment fairly well. He had repeat CT scan of the chest that showed improvement of his disease. I personally and independently reviewed the scan images and discuss the results with the patient and his wife today. I gave the patient the option of continuous observation, monitoring versus consideration of treatment with consolidation immunotherapy with Imfinzi (Durvalumab) 10 MG/KG every 2 weeks for a total of 1 year if there is no evidence for disease progression or unacceptable toxicity. I discussed with the patient the adverse effect of the immunotherapy including but not limited to immunotherapy mediated skin rash, diarrhea, inflammation of the lung, kidney, liver, thyroid or other endocrine dysfunction including type 1 diabetes mellitus. The patient and his wife are interested in proceeding with the immunotherapy and he is expected to start the first dose of this treatment in 2 weeks. He would come back for follow-up visit at that time. For smoke cessation, I strongly encouraged the patient to quit smoking and offered him a smoke cessation program but the patient declined and not willing to quit smoking. He was advised to call immediately if he has any concerning symptoms in the interval. The patient voices understanding of current disease status and treatment options and is in agreement with the current care plan. All questions were answered. The patient knows to call the clinic with any problems, questions or concerns. We can certainly see the patient much sooner if necessary.  Disclaimer: This note was dictated with voice recognition software. Similar sounding words can inadvertently be transcribed and may not be corrected upon review.

## 2017-02-07 NOTE — Progress Notes (Signed)
Radiation Oncology         [phone] ________________________________  Name: Matthew Wilkins MRN: [id number]  Date: 02/07/2017  DOB: [birth year]  Follow-Up Visit Note  CC: [name], MD  Marshell Garfinkel, MD    ICD-10-CM   1. Adenocarcinoma of right lung, stage 3 ([facility]) C34.91     Diagnosis: Stage IIIA (T1a N2 M0) non-small cell lung cancer presenting in the right lung.     Interval Since Last Radiation:  1 months   Site/dose:   The Right lung was treated to 60 Gy in 30 fractions at 2 Gy per fractions along with radiosensitizing chemotherapy  Narrative:  The patient returns today for routine follow-up.  He reports some fatigue but otherwise is doing well. He denies any residual esophageal symptoms or breathing problems or skin issues                              ALLERGIES:  is allergic to aspirin.  Meds: Current Outpatient Prescriptions  Medication Sig Dispense Refill  . apixaban (ELIQUIS) 5 MG TABS tablet Take 10mg  po bid for 7 days then 5mg  po bid (Patient taking differently: 5 mg 2 (two) times daily. Take 10mg  po bid for 7 days then 5mg  po bid) 60 tablet 1  . gabapentin (NEURONTIN) 300 MG capsule Take 1 capsule by mouth 3 (three) times daily.    [name] glipiZIDE (GLUCOTROL XL) 5 MG 24 hr tablet Take 10 mg by mouth 2 (two) times daily.     [name] levothyroxine (SYNTHROID) 25 MCG tablet Take 1 tablet (25 mcg total) by mouth daily before breakfast. 30 tablet 11  . lisinopril (PRINIVIL,ZESTRIL) 5 MG tablet Take 5 mg by mouth daily.    . metFORMIN (GLUCOPHAGE-XR) 500 MG 24 hr tablet Take 2 tablets by mouth 2 (two) times daily.    . pantoprazole (PROTONIX) 40 MG tablet Take 40 mg by mouth 2 (two) times daily.     . prochlorperazine (COMPAZINE) 10 MG tablet Take 1 tablet (10 mg total) by mouth every 6 (six) hours as needed for nausea or vomiting. 30 tablet 0  . rOPINIRole (REQUIP) 3 MG tablet Take 3 mg by mouth 4 (four) times daily as needed (Restless Leg).     . rosuvastatin (CRESTOR)  20 MG tablet Take 1 tablet (20 mg total) by mouth daily. 30 tablet 11  . sertraline (ZOLOFT) 25 MG tablet     . tiotropium (SPIRIVA) 18 MCG inhalation capsule Place 1 capsule into inhaler and inhale daily.    . VENTOLIN HFA 108 (90 Base) MCG/ACT inhaler     . ZETIA 10 MG tablet Take 10 mg by mouth daily.     No current facility-administered medications for this encounter.     Physical Findings: The patient is in no acute distress. Patient is alert and oriented.  weight is 155 lb 6 oz (70.5 kg). His oral temperature is 98.1 F (36.7 C). His blood pressure is 143/61 (abnormal) and his pulse is 66. His respiration is 20 and oxygen saturation is 100%. .  The lungs are clear. The heart has a regular rhythm and rate. No palpable supraclavicular or axillary adenopathy  Lab Findings: Lab Results  Component Value Date   WBC 5.8 02/03/2017   HGB 12.5 (L) 02/03/2017   HCT 36.3 (L) 02/03/2017   MCV 92.1 02/03/2017   PLT 186 02/03/2017    Radiographic Findings: Ct Chest W Contrast  Result  Date: 02/04/2017 CLINICAL DATA:  Lung cancer diagnosed 2018. Chemotherapy radiation therapy complete. EXAM: CT CHEST WITH CONTRAST TECHNIQUE: Multidetector CT imaging of the chest was performed during intravenous contrast administration. CONTRAST:  76mL ISOVUE-300 IOPAMIDOL (ISOVUE-300) INJECTION 61% COMPARISON:  11/04/2016 FINDINGS: Cardiovascular: Coronary artery calcification and aortic atherosclerotic calcification. Mediastinum/Nodes: No axillary supraclavicular adenopathy. No mediastinal hilar adenopathy. Esophagus normal. Lungs/Pleura: 8 mm nodule in the RIGHT middle lobe decreased from 13 mm (image 95, series 5). Two less defined nodules in the upper lobe on image 45 and 52 of series 5 are new from prior but may be infectious or inflammatory. Upper Abdomen: Limited view of the liver, kidneys, pancreas are unremarkable. Normal adrenal glands. Musculoskeletal: No aggressive osseous lesion IMPRESSION: 1. Interval  decrease in size of RIGHT middle lobe nodule. 2. Two small nodules in the RIGHT upper lobe are new from prior but may well represent small foci of infection or inflammation. Recommend attention on follow-up 3. No mediastinal or hilar lymphadenopathy identified. Electronically Signed   By: Suzy Bouchard M.D.   On: 02/04/2017 11:48    Impression:  The patient is recovering from the effects of radiation.  Recent chest CT scan shows good shrinkage of the lung mass. Patient will now proceed with immunotherapy as discussed with the patient earlier by Dr.  Julien Nordmann.  Plan:  When necessary follow-up in radiation oncology  ____________________________________ Gery Pray, MD

## 2017-02-07 NOTE — Telephone Encounter (Signed)
Scheduled appt per 9/25  Los - Gave patient AVS and calender per los.

## 2017-02-07 NOTE — Progress Notes (Signed)
Patient denies any pain. Denies any sob.Denies any diffulty with swallowing or coughing . Patient states that he has some fatigue.States that appetie is ok. Denies any issue with weight loss.Denies any issues with his bowel or bladder. Wt Readings from Last 3 Encounters:  02/07/17 155 lb 6 oz (70.5 kg)  02/07/17 155 lb 3.2 oz (70.4 kg)  01/02/17 148 lb 14.4 oz (67.5 kg)   Vitals:   02/07/17 1413  BP: (!) 143/61  Pulse: 66  Resp: 20  Temp: 98.1 F (36.7 C)  TempSrc: Oral  SpO2: 100%  Weight: 155 lb 6 oz (70.5 kg)

## 2017-02-22 ENCOUNTER — Telehealth: Payer: Self-pay | Admitting: Internal Medicine

## 2017-02-22 NOTE — Telephone Encounter (Signed)
Faxed medical records to [facility] healthcare [phone]

## 2017-02-23 ENCOUNTER — Ambulatory Visit (HOSPITAL_BASED_OUTPATIENT_CLINIC_OR_DEPARTMENT_OTHER): Payer: Medicare HMO

## 2017-02-23 ENCOUNTER — Encounter: Payer: Self-pay | Admitting: Oncology

## 2017-02-23 ENCOUNTER — Other Ambulatory Visit (HOSPITAL_BASED_OUTPATIENT_CLINIC_OR_DEPARTMENT_OTHER): Payer: Medicare HMO

## 2017-02-23 ENCOUNTER — Ambulatory Visit (HOSPITAL_BASED_OUTPATIENT_CLINIC_OR_DEPARTMENT_OTHER): Payer: Medicare HMO | Admitting: Oncology

## 2017-02-23 VITALS — BP 135/57 | HR 78 | Temp 98.2°F | Resp 18 | Ht 68.0 in | Wt 156.7 lb

## 2017-02-23 DIAGNOSIS — C3491 Malignant neoplasm of unspecified part of right bronchus or lung: Secondary | ICD-10-CM

## 2017-02-23 DIAGNOSIS — Z79899 Other long term (current) drug therapy: Secondary | ICD-10-CM

## 2017-02-23 DIAGNOSIS — C342 Malignant neoplasm of middle lobe, bronchus or lung: Secondary | ICD-10-CM

## 2017-02-23 DIAGNOSIS — R5382 Chronic fatigue, unspecified: Secondary | ICD-10-CM

## 2017-02-23 DIAGNOSIS — Z5112 Encounter for antineoplastic immunotherapy: Secondary | ICD-10-CM

## 2017-02-23 LAB — COMPREHENSIVE METABOLIC PANEL
ALBUMIN: 3.8 g/dL (ref 3.5–5.0)
ALK PHOS: 72 U/L (ref 40–150)
ALT: 10 U/L (ref 0–55)
AST: 14 U/L (ref 5–34)
Anion Gap: 10 mEq/L (ref 3–11)
BUN: 12.7 mg/dL (ref 7.0–26.0)
CALCIUM: 9.3 mg/dL (ref 8.4–10.4)
CO2: 18 mEq/L — ABNORMAL LOW (ref 22–29)
CREATININE: 0.9 mg/dL (ref 0.7–1.3)
Chloride: 108 mEq/L (ref 98–109)
EGFR: >60 mL/min/{1.73_m2} (ref >60)
GLUCOSE: 124 mg/dL (ref 70–140)
Potassium: 4.3 mEq/L (ref 3.5–5.1)
Sodium: 136 mEq/L (ref 136–145)
TOTAL PROTEIN: 7 g/dL (ref 6.4–8.3)
Total Bilirubin: 0.57 mg/dL (ref 0.20–1.20)

## 2017-02-23 LAB — TSH: TSH: 2.632 m(IU)/L (ref 0.320–4.118)

## 2017-02-23 LAB — CBC WITH DIFFERENTIAL/PLATELET
BASO%: 1.2 % (ref 0.0–2.0)
BASOS ABS: 0.1 10*3/uL (ref 0.0–0.1)
EOS%: 2 % (ref 0.0–7.0)
Eosinophils Absolute: 0.2 10*3/uL (ref 0.0–0.5)
HEMATOCRIT: 40.9 % (ref 38.4–49.9)
HEMOGLOBIN: 13.9 g/dL (ref 13.0–17.1)
LYMPH#: 0.6 10*3/uL — AB (ref 0.9–3.3)
LYMPH%: 7.8 % — ABNORMAL LOW (ref 14.0–49.0)
MCH: 31.7 pg (ref 27.2–33.4)
MCHC: 34.1 g/dL (ref 32.0–36.0)
MCV: 92.9 fL (ref 79.3–98.0)
MONO#: 0.6 10*3/uL (ref 0.1–0.9)
MONO%: 7.4 % (ref 0.0–14.0)
NEUT%: 81.6 % — ABNORMAL HIGH (ref 39.0–75.0)
NEUTROS ABS: 6.8 10*3/uL — AB (ref 1.5–6.5)
Platelets: 209 10*3/uL (ref 140–400)
RBC: 4.4 10*6/uL (ref 4.20–5.82)
RDW: 16.4 % — AB (ref 11.0–14.6)
WBC: 8.3 10*3/uL (ref 4.0–10.3)

## 2017-02-23 MED ORDER — SODIUM CHLORIDE 0.9 % IV SOLN
Freq: Once | INTRAVENOUS | Status: AC
  Administered 2017-02-23: 12:00:00 via INTRAVENOUS

## 2017-02-23 MED ORDER — SODIUM CHLORIDE 0.9 % IV SOLN
10.5000 mg/kg | Freq: Once | INTRAVENOUS | Status: AC
  Administered 2017-02-23: 740 mg via INTRAVENOUS
  Filled 2017-02-23: qty 4.8

## 2017-02-23 NOTE — Assessment & Plan Note (Signed)
This is a very pleasant 71 year old white male with a stage IIIa non-small cell lung cancer, squamous cell carcinoma  The patient underwent a course of concurrent chemoradiation with weekly carboplatin and paclitaxel status post 7 cycles and tolerated his treatment fairly well. He had repeat CT scan of the chest that showed improvement of his disease.   The patient is here to begin consolidation immunotherapy with Imfinzi (Durvalumab) 10 MG/KG every 2 weeks for a total of 1 year if there is no evidence for disease progression or unacceptable toxicity. I have again reviewed with the patient the adverse effect of the immunotherapy including but not limited to immunotherapy mediated skin rash, diarrhea, inflammation of the lung, kidney, liver, thyroid or other endocrine dysfunction including type 1 diabetes mellitus. The patient is in agreement to proceeding with the immunotherapy. He will proceed with cycle 1 as scheduled today.  The patient will follow-up in 2 weeks for evaluation prior to cycle 2 of Imfinzi.  For smoke cessation, I strongly encouraged the patient to quit smoking and offered him a smoke cessation program but the patient declined and not willing to quit smoking. He was advised to call immediately if he has any concerning symptoms in the interval.  The patient voices understanding of current disease status and treatment options and is in agreement with the current care plan. All questions were answered. The patient knows to call the clinic with any problems, questions or concerns. We can certainly see the patient much sooner if necessary.

## 2017-02-23 NOTE — Patient Instructions (Signed)
[facility] Discharge Instructions for Patients Receiving Chemotherapy  Today you received the following chemotherapy agents: Imfinzi.  To help prevent nausea and vomiting after your treatment, we encourage you to take your nausea medication as directed.   If you develop nausea and vomiting that is not controlled by your nausea medication, call the clinic.   BELOW ARE SYMPTOMS THAT SHOULD BE REPORTED IMMEDIATELY:  *FEVER GREATER THAN 100.5 F  *CHILLS WITH OR WITHOUT FEVER  NAUSEA AND VOMITING THAT IS NOT CONTROLLED WITH YOUR NAUSEA MEDICATION  *UNUSUAL SHORTNESS OF BREATH  *UNUSUAL BRUISING OR BLEEDING  TENDERNESS IN MOUTH AND THROAT WITH OR WITHOUT PRESENCE OF ULCERS  *URINARY PROBLEMS  *BOWEL PROBLEMS  UNUSUAL RASH Items with * indicate a potential emergency and should be followed up as soon as possible.  Feel free to call the clinic should you have any questions or concerns. The clinic phone number is [phone].  Please show the CHEMO ALERT CARD at check-in to the Emergency Department and triage nurse.   

## 2017-02-23 NOTE — Progress Notes (Signed)
[facility] Follow up:    Matthew Sacramento, MD [address] [place] [address]   DIAGNOSIS: Stage IIIA (T1a, N2, M0) non-small cell lung cancer, squamous cell carcinoma presented with right middle lobe pulmonary nodule in addition to ipsilateral hilar and ipsilateral mediastinal lymphadenopathy diagnosed in June 2018.  SUMMARY OF ONCOLOGIC HISTORY:  No history exists.   PRIOR THERAPY:  A course of concurrent chemoradiation with weekly carboplatin for AUC of 2 and paclitaxel 45 MG/M2. Status post 7 cycles.  CURRENT THERAPY: Consolidation immunotherapy with Imfinzi (Durvalumab) 10 MG/KG every 2 weeks.first dose 02/23/2017.  INTERVAL HISTORY: Matthew Wilkins 71 y.o. male returns for a routine follow-up visit accompanied by his daughter. The patient has no complaints today. He denied having any chest pain but continues to have shortness of breath with exertion with mild cough and no hemoptysis. He continues to smoke at regular basis and not willing to quit. He denied having any weight loss or night sweats. He has no nausea, vomiting, diarrhea or constipation. The patient is here for evaluation prior to cycle 1 of his immunotherapy.   Patient Active Problem List   Diagnosis Date Noted  . Encounter for antineoplastic immunotherapy 02/23/2017  . Adenocarcinoma of right lung, stage 3 ([place]) 11/17/2016  . Encounter for antineoplastic chemotherapy 11/17/2016  . Goals of care, counseling/discussion 11/17/2016  . Encounter for smoking cessation counseling 11/17/2016  . Left leg DVT ([place]) 08/31/2016  . Malnutrition of moderate degree 08/30/2016  . Thrombocytopenia ([place]) 08/29/2016  . PE (pulmonary thromboembolism) ([place]) 08/28/2016  . Pulmonary nodule 08/28/2016  . Ureteral stone with hydronephrosis 08/28/2016  . Chest pain 08/28/2016  . CAD (coronary artery disease)   . TIA (transient ischemic attack) 11/27/2011  . Research study patient 08/30/2011  . PAD  (peripheral artery disease) ([place]) 06/30/2011  . PVD (peripheral vascular disease) ([place]) 01/10/2011  . COPD (chronic obstructive pulmonary disease) ([place]) 07/06/2010  . TOBACCO ABUSE 11/12/2008  . CAD 09/08/2008  . DM type 2 (diabetes mellitus, type 2) ([place]) 08/26/2008  . HYPERCHOLESTEROLEMIA  IIA 08/26/2008  . ANXIETY 08/26/2008  . PARKINSON'S DISEASE 08/26/2008  . Essential hypertension 08/26/2008  . ARTHRITIS 08/26/2008  . SHORTNESS OF BREATH 08/26/2008    is allergic to aspirin.  MEDICAL HISTORY: Past Medical History:  Diagnosis Date  . Adenocarcinoma of right lung, stage 3 ([place]) 11/17/2016  . Anxiety   . Arthritis   . Coronary artery disease   . Diabetes mellitus   . DVT (deep venous thrombosis) ([place])   . Encounter for antineoplastic chemotherapy 11/17/2016  . History of radiation therapy 11/28/16-01/06/17   right lung was treated to 60 Gy in 30 fractions  . Hypercholesterolemia   . Hypertension   . PAD (peripheral artery disease) ([place])   . SOB (shortness of breath)     SURGICAL HISTORY: Past Surgical History:  Procedure Laterality Date  . CARDIAC CATHETERIZATION N/A 06/10/2015   Procedure: Left Heart Cath and Coronary Angiography;  Surgeon: Burnell Blanks, MD;  Location: [facility];  Service: Cardiovascular;  Laterality: N/A;  . LOWER EXTREMITY ANGIOGRAM N/A 07/13/2011   Procedure: LOWER EXTREMITY ANGIOGRAM;  Surgeon: Burnell Blanks, MD;  Location: [facility] CATH LAB;  Service: Cardiovascular;  Laterality: N/A;  . OTHER SURGICAL HISTORY    . OTHER SURGICAL HISTORY    . OTHER SURGICAL HISTORY    . PERCUTANEOUS STENT INTERVENTION Right 07/13/2011   Procedure: PERCUTANEOUS STENT INTERVENTION;  Surgeon: Burnell Blanks, MD;  Location: [facility] CATH LAB;  Service: Cardiovascular;  Laterality: Right;    SOCIAL HISTORY: Social History   Social History  . Marital status: Widowed    Spouse name: N/A  . Number of children: N/A  . Years of education: N/A    Occupational History  . Not on file.   Social History Main Topics  . Smoking status: Current Every Day Smoker    Packs/day: 1.00    Years: 65.00    Types: Cigarettes  . Smokeless tobacco: Never Used  . Alcohol use No  . Drug use: No  . Sexual activity: No   Other Topics Concern  . Not on file   Social History Narrative  . No narrative on file    FAMILY HISTORY: Family History  Problem Relation Age of Onset  . Family history unknown: Yes    Review of Systems  Constitutional: Negative.   HENT:  Negative.   Eyes: Negative.   Respiratory: Positive for cough. Negative for hemoptysis.        Mild dyspnea on exertion. No shortness of breath at rest.  Cardiovascular: Negative.   Gastrointestinal: Negative.   Genitourinary: Negative.    Musculoskeletal: Negative.   Skin: Negative.   Neurological: Negative.   Hematological: Negative.   Psychiatric/Behavioral: Negative.       PHYSICAL EXAMINATION  ECOG PERFORMANCE STATUS: 1 - Symptomatic but completely ambulatory  Vitals:   02/23/17 1104  BP: (!) 135/57  Pulse: 78  Resp: 18  Temp: 98.2 F (36.8 C)  SpO2: 99%    Physical Exam  Constitutional: He is oriented to person, place, and time and well-developed, well-nourished, and in no distress. No distress.  HENT:  Head: Normocephalic.  Mouth/Throat: Oropharynx is clear and moist. No oropharyngeal exudate.  Eyes: Conjunctivae are normal. Right eye exhibits no discharge. Left eye exhibits no discharge. No scleral icterus.  Neck: Normal range of motion. Neck supple.  Cardiovascular: Normal rate, regular rhythm, normal heart sounds and intact distal pulses.   Pulmonary/Chest: Effort normal and breath sounds normal. No respiratory distress. He has no wheezes. He has no rales.  Abdominal: Soft. Bowel sounds are normal. He exhibits no distension and no mass. There is no tenderness.  Musculoskeletal: Normal range of motion. He exhibits no edema.  Lymphadenopathy:    He  has no cervical adenopathy.  Neurological: He is alert and oriented to person, place, and time. He exhibits normal muscle tone. Gait normal.  Skin: Skin is warm and dry. No rash noted. He is not diaphoretic. No erythema. No pallor.  Psychiatric: Mood, memory, affect and judgment normal.  Vitals reviewed.   LABORATORY DATA:  CBC    Component Value Date/Time   WBC 8.3 02/23/2017 1044   WBC 8.3 11/04/2016 0837   RBC 4.40 02/23/2017 1044   RBC 4.88 11/04/2016 0837   HGB 13.9 02/23/2017 1044   HCT 40.9 02/23/2017 1044   PLT 209 02/23/2017 1044   MCV 92.9 02/23/2017 1044   MCH 31.7 02/23/2017 1044   MCH 30.1 11/04/2016 0837   MCHC 34.1 02/23/2017 1044   MCHC 34.4 11/04/2016 0837   RDW 16.4 (H) 02/23/2017 1044   LYMPHSABS 0.6 (L) 02/23/2017 1044   MONOABS 0.6 02/23/2017 1044   EOSABS 0.2 02/23/2017 1044   BASOSABS 0.1 02/23/2017 1044    CMP     Component Value Date/Time   NA 136 02/23/2017 1044   K 4.3 02/23/2017 1044   CL 109 10/14/2016 1859   CO2 18 (L) 02/23/2017 1044  GLUCOSE 124 02/23/2017 1044   BUN 12.7 02/23/2017 1044   CREATININE 0.9 02/23/2017 1044   CALCIUM 9.3 02/23/2017 1044   PROT 7.0 02/23/2017 1044   ALBUMIN 3.8 02/23/2017 1044   AST 14 02/23/2017 1044   ALT 10 02/23/2017 1044   ALKPHOS 72 02/23/2017 1044   BILITOT 0.57 02/23/2017 1044   GFRNONAA >60 10/14/2016 1859   GFRAA >60 10/14/2016 1859    RADIOGRAPHIC STUDIES:  No results found.  ASSESSMENT and THERAPY PLAN:   Adenocarcinoma of right lung, stage 3 ([place]) This is a very pleasant 71 year old white male with a stage IIIa non-small cell lung cancer, squamous cell carcinoma  The patient underwent a course of concurrent chemoradiation with weekly carboplatin and paclitaxel status post 7 cycles and tolerated his treatment fairly well. He had repeat CT scan of the chest that showed improvement of his disease.   The patient is here to begin consolidation immunotherapy with Imfinzi (Durvalumab) 10  MG/KG every 2 weeks for a total of 1 year if there is no evidence for disease progression or unacceptable toxicity. I have again reviewed with the patient the adverse effect of the immunotherapy including but not limited to immunotherapy mediated skin rash, diarrhea, inflammation of the lung, kidney, liver, thyroid or other endocrine dysfunction including type 1 diabetes mellitus. The patient is in agreement to proceeding with the immunotherapy. He will proceed with cycle 1 as scheduled today.  The patient will follow-up in 2 weeks for evaluation prior to cycle 2 of Imfinzi.  For smoke cessation, I strongly encouraged the patient to quit smoking and offered him a smoke cessation program but the patient declined and not willing to quit smoking. He was advised to call immediately if he has any concerning symptoms in the interval.  The patient voices understanding of current disease status and treatment options and is in agreement with the current care plan. All questions were answered. The patient knows to call the clinic with any problems, questions or concerns. We can certainly see the patient much sooner if necessary.   No orders of the defined types were placed in this encounter.   All questions were answered. The patient knows to call the clinic with any problems, questions or concerns. We can certainly see the patient much sooner if necessary.  Mikey Bussing, NP 02/23/2017

## 2017-02-24 ENCOUNTER — Telehealth: Payer: Self-pay | Admitting: Oncology

## 2017-02-24 NOTE — Telephone Encounter (Signed)
No 10/11 los.

## 2017-03-08 ENCOUNTER — Other Ambulatory Visit: Payer: Self-pay | Admitting: Internal Medicine

## 2017-03-09 ENCOUNTER — Ambulatory Visit (HOSPITAL_BASED_OUTPATIENT_CLINIC_OR_DEPARTMENT_OTHER): Payer: Medicare HMO

## 2017-03-09 ENCOUNTER — Ambulatory Visit (HOSPITAL_BASED_OUTPATIENT_CLINIC_OR_DEPARTMENT_OTHER): Payer: Medicare HMO | Admitting: Oncology

## 2017-03-09 ENCOUNTER — Other Ambulatory Visit (HOSPITAL_BASED_OUTPATIENT_CLINIC_OR_DEPARTMENT_OTHER): Payer: Medicare HMO

## 2017-03-09 VITALS — BP 166/78 | HR 60 | Temp 97.6°F | Resp 17 | Ht 68.0 in | Wt 155.1 lb

## 2017-03-09 DIAGNOSIS — C342 Malignant neoplasm of middle lobe, bronchus or lung: Secondary | ICD-10-CM

## 2017-03-09 DIAGNOSIS — F17200 Nicotine dependence, unspecified, uncomplicated: Secondary | ICD-10-CM

## 2017-03-09 DIAGNOSIS — R5382 Chronic fatigue, unspecified: Secondary | ICD-10-CM

## 2017-03-09 DIAGNOSIS — Z5111 Encounter for antineoplastic chemotherapy: Secondary | ICD-10-CM

## 2017-03-09 DIAGNOSIS — Z5112 Encounter for antineoplastic immunotherapy: Secondary | ICD-10-CM | POA: Diagnosis not present

## 2017-03-09 DIAGNOSIS — C3491 Malignant neoplasm of unspecified part of right bronchus or lung: Secondary | ICD-10-CM

## 2017-03-09 DIAGNOSIS — R69 Illness, unspecified: Secondary | ICD-10-CM | POA: Diagnosis not present

## 2017-03-09 LAB — COMPREHENSIVE METABOLIC PANEL
ALBUMIN: 4.1 g/dL (ref 3.5–5.0)
ALK PHOS: 79 U/L (ref 40–150)
ALT: 8 U/L (ref 0–55)
AST: 13 U/L (ref 5–34)
Anion Gap: 12 mEq/L — ABNORMAL HIGH (ref 3–11)
BUN: 11.7 mg/dL (ref 7.0–26.0)
CHLORIDE: 109 meq/L (ref 98–109)
CO2: 20 mEq/L — ABNORMAL LOW (ref 22–29)
CREATININE: 0.9 mg/dL (ref 0.7–1.3)
Calcium: 9.7 mg/dL (ref 8.4–10.4)
EGFR: >60 mL/min/{1.73_m2} (ref >60)
GLUCOSE: 120 mg/dL (ref 70–140)
POTASSIUM: 4 meq/L (ref 3.5–5.1)
SODIUM: 140 meq/L (ref 136–145)
Total Bilirubin: 0.6 mg/dL (ref 0.20–1.20)
Total Protein: 7.6 g/dL (ref 6.4–8.3)

## 2017-03-09 LAB — CBC WITH DIFFERENTIAL/PLATELET
BASO%: 1.6 % (ref 0.0–2.0)
Basophils Absolute: 0.1 10*3/uL (ref 0.0–0.1)
EOS ABS: 0.2 10*3/uL (ref 0.0–0.5)
EOS%: 3.6 % (ref 0.0–7.0)
HCT: 40.5 % (ref 38.4–49.9)
HEMOGLOBIN: 14.1 g/dL (ref 13.0–17.1)
LYMPH%: 13 % — ABNORMAL LOW (ref 14.0–49.0)
MCH: 31.8 pg (ref 27.2–33.4)
MCHC: 34.8 g/dL (ref 32.0–36.0)
MCV: 91.4 fL (ref 79.3–98.0)
MONO#: 0.5 10*3/uL (ref 0.1–0.9)
MONO%: 9.4 % (ref 0.0–14.0)
NEUT#: 3.6 10*3/uL (ref 1.5–6.5)
NEUT%: 72.4 % (ref 39.0–75.0)
Platelets: 216 10*3/uL (ref 140–400)
RBC: 4.43 10*6/uL (ref 4.20–5.82)
RDW: 13.9 % (ref 11.0–14.6)
WBC: 5 10*3/uL (ref 4.0–10.3)
lymph#: 0.7 10*3/uL — ABNORMAL LOW (ref 0.9–3.3)

## 2017-03-09 MED ORDER — SODIUM CHLORIDE 0.9 % IV SOLN
Freq: Once | INTRAVENOUS | Status: AC
  Administered 2017-03-09: 13:00:00 via INTRAVENOUS

## 2017-03-09 MED ORDER — SODIUM CHLORIDE 0.9 % IV SOLN: 10.0000 mg/kg | Freq: Once | INTRAVENOUS | Status: DC

## 2017-03-09 MED ORDER — DURVALUMAB 500 MG/10ML IV SOLN
10.4000 mg/kg | Freq: Once | INTRAVENOUS | Status: AC
  Administered 2017-03-09: 740 mg via INTRAVENOUS
  Filled 2017-03-09: qty 10

## 2017-03-09 NOTE — Progress Notes (Signed)
[facility] Follow up:    Matthew Sacramento, MD [address] [place] [address]   DIAGNOSIS: Stage IIIA (T1a, N2, M0) non-small cell lung cancer, squamous cell carcinoma presented with right middle lobe pulmonary nodule in addition to ipsilateral hilar and ipsilateral mediastinal lymphadenopathy diagnosed in June 2018.  SUMMARY OF ONCOLOGIC Wilkins:  No Wilkins exists.   PRIOR THERAPY: A course of concurrent chemoradiation with weekly carboplatin for AUC of 2 and paclitaxel 45 MG/M2. Status post 7cycles.  CURRENT THERAPY: Consolidation immunotherapy with Imfinzi (Durvalumab) 10 MG/KG every 2 weeks.first dose 02/23/2017. Status post 1 cycle.  INTERVAL Wilkins: Matthew Wilkins 71 y.o. male returns for a routine follow-up visit by himself. The patient has no complaints today. He tolerated his first cycle of Imfinzi well. He denied having any chest pain but continues to have shortness of breath with exertion with mild cough and no hemoptysis. He continues to smoke at regular basis and not willing to quit. He denied having any weight loss or night sweats. He has no nausea, vomiting, diarrhea or constipation. The patient is here for evaluation prior to cycle 2 of his immunotherapy.   Patient Active Problem List   Diagnosis Date Noted  . Encounter for antineoplastic immunotherapy 02/23/2017  . Adenocarcinoma of right lung, stage 3 (Matthew Wilkins) 11/17/2016  . Encounter for antineoplastic chemotherapy 11/17/2016  . Goals of care, counseling/discussion 11/17/2016  . Encounter for smoking cessation counseling 11/17/2016  . Left leg DVT (Matthew Wilkins) 08/31/2016  . Malnutrition of moderate degree 08/30/2016  . Thrombocytopenia (Matthew Wilkins) 08/29/2016  . PE (pulmonary thromboembolism) (Matthew Wilkins) 08/28/2016  . Pulmonary nodule 08/28/2016  . Ureteral stone with hydronephrosis 08/28/2016  . Chest pain 08/28/2016  . CAD (coronary artery disease)   . TIA (transient ischemic attack) 11/27/2011  .  Research study patient 08/30/2011  . PAD (peripheral artery disease) (Matthew Wilkins) 06/30/2011  . PVD (peripheral vascular disease) (Matthew Wilkins) 01/10/2011  . COPD (chronic obstructive pulmonary disease) (Matthew Wilkins) 07/06/2010  . TOBACCO ABUSE 11/12/2008  . CAD 09/08/2008  . DM type 2 (diabetes mellitus, type 2) (Matthew Wilkins) 08/26/2008  . HYPERCHOLESTEROLEMIA  IIA 08/26/2008  . ANXIETY 08/26/2008  . PARKINSON'S DISEASE 08/26/2008  . Essential hypertension 08/26/2008  . ARTHRITIS 08/26/2008  . SHORTNESS OF BREATH 08/26/2008    is allergic to aspirin.  MEDICAL Wilkins: Past Medical Wilkins:  Diagnosis Date  . Adenocarcinoma of right lung, stage 3 (Matthew Wilkins) 11/17/2016  . Anxiety   . Arthritis   . Coronary artery disease   . Diabetes mellitus   . DVT (deep venous thrombosis) (Matthew Wilkins)   . Encounter for antineoplastic chemotherapy 11/17/2016  . Wilkins of radiation therapy 11/28/16-01/06/17   right lung was treated to 60 Gy in 30 fractions  . Hypercholesterolemia   . Hypertension   . PAD (peripheral artery disease) (Matthew Wilkins)   . SOB (shortness of breath)     SURGICAL Wilkins: Past Surgical Wilkins:  Procedure Laterality Date  . CARDIAC CATHETERIZATION N/A 06/10/2015   Procedure: Left Heart Cath and Coronary Angiography;  Surgeon: Burnell Blanks, MD;  Location: [facility];  Service: Cardiovascular;  Laterality: N/A;  . LOWER EXTREMITY ANGIOGRAM N/A 07/13/2011   Procedure: LOWER EXTREMITY ANGIOGRAM;  Surgeon: Burnell Blanks, MD;  Location: [facility] CATH LAB;  Service: Cardiovascular;  Laterality: N/A;  . Matthew Wilkins    . Matthew Wilkins    . Matthew Wilkins    . PERCUTANEOUS STENT INTERVENTION Right 07/13/2011   Procedure: PERCUTANEOUS  STENT INTERVENTION;  Surgeon: Burnell Blanks, MD;  Location: [facility] CATH LAB;  Service: Cardiovascular;  Laterality: Right;    SOCIAL Wilkins: Social Wilkins   Social Wilkins  . Marital status: Widowed    Spouse name: N/A  . Number of  children: N/A  . Years of education: N/A   Occupational Wilkins  . Not on file.   Social Wilkins Main Topics  . Smoking status: Current Every Day Smoker    Packs/day: 1.00    Years: 65.00    Types: Cigarettes  . Smokeless tobacco: Never Used  . Alcohol use No  . Drug use: No  . Sexual activity: No   [place] Topics Concern  . Not on file   Social Wilkins Narrative  . No narrative on file    FAMILY Wilkins: Family Wilkins  Problem Relation Age of Onset  . Family Wilkins unknown: Yes    Review of Systems  Constitutional: Negative.   HENT:   Positive for hearing loss. Negative for mouth sores, nosebleeds, sore throat and trouble swallowing.   Eyes: Negative.   Respiratory: Positive for cough. Negative for hemoptysis.        Shortness of breath with exertion.  Cardiovascular: Negative.   Gastrointestinal: Negative.   Genitourinary: Negative.    Musculoskeletal: Negative.   Skin: Negative.   Neurological: Negative.   Hematological: Negative.   Psychiatric/Behavioral: Negative.       PHYSICAL EXAMINATION  ECOG PERFORMANCE STATUS: 1 - Symptomatic but completely ambulatory  Vitals:   03/09/17 1111  BP: (!) 166/78  Pulse: 60  Resp: 17  Temp: 97.6 F (36.4 C)  SpO2: 100%    Physical Exam  Constitutional: He is oriented to person, place, and time and well-developed, well-nourished, and in no distress. No distress.  HENT:  Head: Normocephalic and atraumatic.  Mouth/Throat: Oropharynx is clear and moist. No oropharyngeal exudate.  Eyes: Conjunctivae are normal. Right eye exhibits no discharge. Left eye exhibits no discharge. No scleral icterus.  Neck: Normal range of motion. Neck supple.  Cardiovascular: Normal rate, regular rhythm, normal heart sounds and intact distal pulses.   Pulmonary/Chest: Effort normal and breath sounds normal. No respiratory distress. He has no wheezes. He has no rales.  Abdominal: Soft. Bowel sounds are normal. He exhibits no distension  and no mass. There is no tenderness.  Musculoskeletal: Normal range of motion. He exhibits no edema.  Lymphadenopathy:    He has no cervical adenopathy.  Neurological: He is alert and oriented to person, place, and time. He exhibits normal muscle tone. Gait normal. Coordination normal.  Skin: Skin is warm and dry. No rash noted. He is not diaphoretic. No erythema. No pallor.  Psychiatric: Mood, memory, affect and judgment normal.  Vitals reviewed.   LABORATORY DATA:  CBC    Component Value Date/Time   WBC 5.0 03/09/2017 1011   WBC 8.3 11/04/2016 0837   RBC 4.43 03/09/2017 1011   RBC 4.88 11/04/2016 0837   HGB 14.1 03/09/2017 1011   HCT 40.5 03/09/2017 1011   PLT 216 03/09/2017 1011   MCV 91.4 03/09/2017 1011   MCH 31.8 03/09/2017 1011   MCH 30.1 11/04/2016 0837   MCHC 34.8 03/09/2017 1011   MCHC 34.4 11/04/2016 0837   RDW 13.9 03/09/2017 1011   LYMPHSABS 0.7 (L) 03/09/2017 1011   MONOABS 0.5 03/09/2017 1011   EOSABS 0.2 03/09/2017 1011   BASOSABS 0.1 03/09/2017 1011    CMP     Component Value Date/Time   NA 140  03/09/2017 1011   K 4.0 03/09/2017 1011   CL 109 10/14/2016 1859   CO2 20 (L) 03/09/2017 1011   GLUCOSE 120 03/09/2017 1011   BUN 11.7 03/09/2017 1011   CREATININE 0.9 03/09/2017 1011   CALCIUM 9.7 03/09/2017 1011   PROT 7.6 03/09/2017 1011   ALBUMIN 4.1 03/09/2017 1011   AST 13 03/09/2017 1011   ALT 8 03/09/2017 1011   ALKPHOS 79 03/09/2017 1011   BILITOT 0.60 03/09/2017 1011   GFRNONAA >60 10/14/2016 1859   GFRAA >60 10/14/2016 1859     RADIOGRAPHIC STUDIES:  No results found.   ASSESSMENT and THERAPY PLAN:   Adenocarcinoma of right lung, stage 3 ([place]) This is a very pleasant 71 year old white male with a stage IIIa non-small cell lung cancer, squamous cell carcinoma  The patient underwent a course of concurrent chemoradiation with weekly carboplatin and paclitaxel status post 7 cycles and tolerated his treatment fairly well. He had repeat CT  scan of the chest that showed improvement of his disease.   The patient is on consolidation immunotherapy with Imfinzi (Durvalumab) 10 MG/KG every 2 weeks for a total of 1 year if there is no evidence for disease progression or unacceptable toxicity. Tolerated his first cycle well. Recommend that he proceed with cycle 2 as scheduled today.  The patient will follow-up in 2 weeks for evaluation prior to cycle 3 of Imfinzi.  For smoke cessation, I strongly encouraged the patient to quit smoking and offered him a smoke cessation program but the patient declined and not willing to quit smoking. He was advised to call immediately if he has any concerning symptoms in the interval.  The patient voices understanding of current disease status and treatment options and is in agreement with the current care plan. All questions were answered. The patient knows to call the clinic with any problems, questions or concerns. We can certainly see the patient much sooner if necessary.   No orders of the defined types were placed in this encounter.   All questions were answered. The patient knows to call the clinic with any problems, questions or concerns. We can certainly see the patient much sooner if necessary.  Mikey Bussing, NP 03/10/2017

## 2017-03-09 NOTE — Patient Instructions (Signed)
[facility] Discharge Instructions for Patients Receiving Chemotherapy  Today you received the following chemotherapy agents: Imfinzi.  To help prevent nausea and vomiting after your treatment, we encourage you to take your nausea medication as directed.   If you develop nausea and vomiting that is not controlled by your nausea medication, call the clinic.   BELOW ARE SYMPTOMS THAT SHOULD BE REPORTED IMMEDIATELY:  *FEVER GREATER THAN 100.5 F  *CHILLS WITH OR WITHOUT FEVER  NAUSEA AND VOMITING THAT IS NOT CONTROLLED WITH YOUR NAUSEA MEDICATION  *UNUSUAL SHORTNESS OF BREATH  *UNUSUAL BRUISING OR BLEEDING  TENDERNESS IN MOUTH AND THROAT WITH OR WITHOUT PRESENCE OF ULCERS  *URINARY PROBLEMS  *BOWEL PROBLEMS  UNUSUAL RASH Items with * indicate a potential emergency and should be followed up as soon as possible.  Feel free to call the clinic should you have any questions or concerns. The clinic phone number is [phone].  Please show the CHEMO ALERT CARD at check-in to the Emergency Department and triage nurse.   

## 2017-03-10 ENCOUNTER — Telehealth: Payer: Self-pay | Admitting: Internal Medicine

## 2017-03-10 ENCOUNTER — Encounter: Payer: Self-pay | Admitting: Oncology

## 2017-03-10 NOTE — Telephone Encounter (Signed)
Scheduled appt per 10/25 los - patient to pick up new schedule next visit.

## 2017-03-10 NOTE — Assessment & Plan Note (Signed)
This is a very pleasant 71 year old white male with a stage IIIa non-small cell lung cancer, squamous cell carcinoma  The patient underwent a course of concurrent chemoradiation with weekly carboplatin and paclitaxel status post 7 cycles and tolerated his treatment fairly well. He had repeat CT scan of the chest that showed improvement of his disease.   The patient is on consolidation immunotherapy with Imfinzi (Durvalumab) 10 MG/KG every 2 weeks for a total of 1 year if there is no evidence for disease progression or unacceptable toxicity. Tolerated his first cycle well. Recommend that he proceed with cycle 2 as scheduled today.  The patient will follow-up in 2 weeks for evaluation prior to cycle 3 of Imfinzi.  For smoke cessation, I strongly encouraged the patient to quit smoking and offered him a smoke cessation program but the patient declined and not willing to quit smoking. He was advised to call immediately if he has any concerning symptoms in the interval.  The patient voices understanding of current disease status and treatment options and is in agreement with the current care plan. All questions were answered. The patient knows to call the clinic with any problems, questions or concerns. We can certainly see the patient much sooner if necessary.

## 2017-03-23 ENCOUNTER — Encounter: Payer: Self-pay | Admitting: *Deleted

## 2017-03-23 ENCOUNTER — Other Ambulatory Visit (HOSPITAL_BASED_OUTPATIENT_CLINIC_OR_DEPARTMENT_OTHER): Payer: Medicare HMO

## 2017-03-23 ENCOUNTER — Ambulatory Visit (HOSPITAL_BASED_OUTPATIENT_CLINIC_OR_DEPARTMENT_OTHER): Payer: Medicare HMO | Admitting: Oncology

## 2017-03-23 ENCOUNTER — Other Ambulatory Visit: Payer: Self-pay | Admitting: *Deleted

## 2017-03-23 ENCOUNTER — Ambulatory Visit (HOSPITAL_BASED_OUTPATIENT_CLINIC_OR_DEPARTMENT_OTHER): Payer: Medicare HMO

## 2017-03-23 ENCOUNTER — Encounter: Payer: Self-pay | Admitting: Oncology

## 2017-03-23 VITALS — BP 159/77 | HR 78 | Temp 97.7°F | Resp 18 | Ht 68.0 in | Wt 153.7 lb

## 2017-03-23 DIAGNOSIS — Z5112 Encounter for antineoplastic immunotherapy: Secondary | ICD-10-CM

## 2017-03-23 DIAGNOSIS — F17210 Nicotine dependence, cigarettes, uncomplicated: Secondary | ICD-10-CM | POA: Diagnosis not present

## 2017-03-23 DIAGNOSIS — Z79899 Other long term (current) drug therapy: Secondary | ICD-10-CM | POA: Diagnosis not present

## 2017-03-23 DIAGNOSIS — C342 Malignant neoplasm of middle lobe, bronchus or lung: Secondary | ICD-10-CM

## 2017-03-23 DIAGNOSIS — Z006 Encounter for examination for normal comparison and control in clinical research program: Secondary | ICD-10-CM

## 2017-03-23 DIAGNOSIS — R5382 Chronic fatigue, unspecified: Secondary | ICD-10-CM

## 2017-03-23 DIAGNOSIS — C3491 Malignant neoplasm of unspecified part of right bronchus or lung: Secondary | ICD-10-CM

## 2017-03-23 DIAGNOSIS — R69 Illness, unspecified: Secondary | ICD-10-CM | POA: Diagnosis not present

## 2017-03-23 LAB — CBC WITH DIFFERENTIAL/PLATELET
BASO%: 1.3 % (ref 0.0–2.0)
BASOS ABS: 0.1 10*3/uL (ref 0.0–0.1)
EOS%: 2.6 % (ref 0.0–7.0)
Eosinophils Absolute: 0.2 10*3/uL (ref 0.0–0.5)
HCT: 41.2 % (ref 38.4–49.9)
HGB: 14.1 g/dL (ref 13.0–17.1)
LYMPH%: 15.2 % (ref 14.0–49.0)
MCH: 30.6 pg (ref 27.2–33.4)
MCHC: 34.2 g/dL (ref 32.0–36.0)
MCV: 89.7 fL (ref 79.3–98.0)
MONO#: 0.5 10*3/uL (ref 0.1–0.9)
MONO%: 8.3 % (ref 0.0–14.0)
NEUT#: 4.1 10*3/uL (ref 1.5–6.5)
NEUT%: 72.6 % (ref 39.0–75.0)
Platelets: 234 10*3/uL (ref 140–400)
RBC: 4.59 10*6/uL (ref 4.20–5.82)
RDW: 13.9 % (ref 11.0–14.6)
WBC: 5.7 10*3/uL (ref 4.0–10.3)
lymph#: 0.9 10*3/uL (ref 0.9–3.3)

## 2017-03-23 LAB — COMPREHENSIVE METABOLIC PANEL
ALBUMIN: 3.8 g/dL (ref 3.5–5.0)
ALK PHOS: 95 U/L (ref 40–150)
ALT: 7 U/L (ref 0–55)
ANION GAP: 12 meq/L — AB (ref 3–11)
AST: 12 U/L (ref 5–34)
BILIRUBIN TOTAL: 0.56 mg/dL (ref 0.20–1.20)
BUN: 16.5 mg/dL (ref 7.0–26.0)
CO2: 19 meq/L — AB (ref 22–29)
CREATININE: 0.9 mg/dL (ref 0.7–1.3)
Calcium: 9.6 mg/dL (ref 8.4–10.4)
Chloride: 107 mEq/L (ref 98–109)
GLUCOSE: 188 mg/dL — AB (ref 70–140)
Potassium: 3.8 mEq/L (ref 3.5–5.1)
Sodium: 138 mEq/L (ref 136–145)
TOTAL PROTEIN: 7.6 g/dL (ref 6.4–8.3)

## 2017-03-23 LAB — TSH: TSH: 3.806 m[IU]/L (ref 0.320–4.118)

## 2017-03-23 MED ORDER — SODIUM CHLORIDE 0.9 % IV SOLN
Freq: Once | INTRAVENOUS | Status: AC
  Administered 2017-03-23: 12:00:00 via INTRAVENOUS

## 2017-03-23 MED ORDER — SODIUM CHLORIDE 0.9 % IV SOLN
10.4000 mg/kg | Freq: Once | INTRAVENOUS | Status: AC
  Administered 2017-03-23: 740 mg via INTRAVENOUS
  Filled 2017-03-23: qty 10

## 2017-03-23 NOTE — Progress Notes (Signed)
Glucose 188 asymptomatic. Pt stated will take metformin when gets home, forgot this AM.

## 2017-03-23 NOTE — Patient Instructions (Addendum)
[facility] Discharge Instructions for Patients Receiving Chemotherapy  Today you received the following chemotherapy agents:  Imfinzi.  To help prevent nausea and vomiting after your treatment, we encourage you to take your nausea medication as directed.   If you develop nausea and vomiting that is not controlled by your nausea medication, call the clinic.   BELOW ARE SYMPTOMS THAT SHOULD BE REPORTED IMMEDIATELY:  *FEVER GREATER THAN 100.5 F  *CHILLS WITH OR WITHOUT FEVER  NAUSEA AND VOMITING THAT IS NOT CONTROLLED WITH YOUR NAUSEA MEDICATION  *UNUSUAL SHORTNESS OF BREATH  *UNUSUAL BRUISING OR BLEEDING  TENDERNESS IN MOUTH AND THROAT WITH OR WITHOUT PRESENCE OF ULCERS  *URINARY PROBLEMS  *BOWEL PROBLEMS  UNUSUAL RASH Items with * indicate a potential emergency and should be followed up as soon as possible.  Feel free to call the clinic should you have any questions or concerns. The clinic phone number is [phone].  Please show the [facility] at check-in to the Emergency Department and triage nurse.   Hyperglycemia (HIGH BLOOD SUGAR) Hyperglycemia is when the sugar (glucose) level in your blood is too high. It may not cause symptoms. If you do have symptoms, they may include warning signs, such as:  Feeling more thirsty than normal.  Hunger.  Feeling tired.  Needing to pee (urinate) more than normal.  Blurry eyesight (vision).  You may get other symptoms as it gets worse, such as:  Dry mouth.  Not being hungry (loss of appetite).  Fruity-smelling breath.  Weakness.  Weight gain or loss that is not planned. Weight loss may be fast.  A tingling or numb feeling in your hands or feet.  Headache.  Skin that does not bounce back quickly when it is lightly pinched and released (poor skin turgor).  Pain in your belly (abdomen).  Cuts or bruises that heal slowly.  High blood sugar can happen to people who do or do not have  diabetes. High blood sugar can happen slowly or quickly, and it can be an emergency. Follow these instructions at home: General instructions  Take over-the-counter and prescription medicines only as told by your doctor.  Do not use products that contain nicotine or tobacco, such as cigarettes and e-cigarettes. If you need help quitting, ask your doctor.  Limit alcohol intake to no more than 1 drink per day for nonpregnant women and 2 drinks per day for men. One drink equals 12 oz of beer, 5 oz of wine, or 1 oz of hard liquor.  Manage stress. If you need help with this, ask your doctor.  Keep all follow-up visits as told by your doctor. This is important. Eating and drinking  Stay at a healthy weight.  Exercise regularly, as told by your doctor.  Drink enough fluid, especially when you: ? Exercise. ? Get sick. ? Are in hot temperatures.  Eat healthy foods, such as: ? Low-fat (lean) proteins. ? Complex carbs (complex carbohydrates), such as whole wheat bread or brown rice. ? Fresh fruits and vegetables. ? Low-fat dairy products. ? Healthy fats.  Drink enough fluid to keep your pee (urine) clear or pale yellow. If you have diabetes:  Make sure you know the symptoms of hyperglycemia.  Follow your diabetes management plan, as told by your doctor. Make sure you: ? Take insulin and medicines as told. ? Follow your exercise plan. ? Follow your meal plan. Eat on time. Do not skip meals. ? Check your blood sugar as often as told. Make  sure to check before and after exercise. If you exercise longer or in a different way than you normally do, check your blood sugar more often. ? Follow your sick day plan whenever you cannot eat or drink normally. Make this plan ahead of time with your doctor.  Share your diabetes management plan with people in your workplace, school, and household.  Check your urine for ketones when you are ill and as told by your doctor.  Carry a card or wear  jewelry that says that you have diabetes. Contact a doctor if:  Your blood sugar level is higher than 240 mg/dL (13.3 mmol/L) for 2 days in a row.  You have problems keeping your blood sugar in your target range.  High blood sugar happens often for you. Get help right away if:  You have trouble breathing.  You have a change in how you think, feel, or act (mental status).  You feel sick to your stomach (nauseous), and that feeling does not go away.  You cannot stop throwing up (vomiting). These symptoms may be an emergency. Do not wait to see if the symptoms will go away. Get medical help right away. Call your local emergency services (911 in the [place] Do not drive yourself to the hospital. Summary  Hyperglycemia is when the sugar (glucose) level in your blood is too high.  High blood sugar can happen to people who do or do not have diabetes.  Make sure you drink enough fluids, eat healthy foods, and exercise regularly.  Contact your doctor if you have problems keeping your blood sugar in your target range. This information is not intended to replace advice given to you by your health care provider. Make sure you discuss any questions you have with your health care provider. Document Released: 02/27/2009 Document Revised: 01/18/2016 Document Reviewed: 01/18/2016 Elsevier Interactive Patient Education  2017 [employer].

## 2017-03-23 NOTE — Progress Notes (Signed)
[facility] OFFICE PROGRESS NOTE  Matthew Sacramento, MD [address] [place] [address]  DIAGNOSIS: Stage IIIA (T1a, N2, M0) non-small cell lung cancer, squamous cell carcinoma presented with right middle lobe pulmonary nodule in addition to ipsilateral hilar and ipsilateral mediastinal lymphadenopathy diagnosed in June 2018.  PRIOR THERAPY: A course of concurrent chemoradiation with weekly carboplatin for AUC of 2 and paclitaxel 45 MG/M2. Status post 7cycles.  CURRENT THERAPY: Consolidation immunotherapy with Imfinzi (Durvalumab) 10 MG/KG every 2 weeks.first dose 02/23/2017. Status post 2 cycles.  INTERVAL HISTORY: Matthew Wilkins 71 y.o. male returns for a routine follow-up visit by himself. The patient has no complaints today. He continues to tolerate Imfinzi well. He denied having any chest pain but continues to have shortness of breath with exertion with mild cough and no hemoptysis. He continues to smoke at regular basis and not willing to quit. He denied having any weight loss or night sweats. He has no nausea, vomiting, diarrhea or constipation. The patient is here for evaluation prior to cycle 3 of Imfinzi.  MEDICAL HISTORY: Past Medical History:  Diagnosis Date  . Adenocarcinoma of right lung, stage 3 ([facility]) 11/17/2016  . Anxiety   . Arthritis   . Coronary artery disease   . Diabetes mellitus   . DVT (deep venous thrombosis) ([facility])   . Encounter for antineoplastic chemotherapy 11/17/2016  . History of radiation therapy 11/28/16-01/06/17   right lung was treated to 60 Gy in 30 fractions  . Hypercholesterolemia   . Hypertension   . PAD (peripheral artery disease) ([facility])   . SOB (shortness of breath)     ALLERGIES:  is allergic to aspirin.  MEDICATIONS:  Current Outpatient Medications  Medication Sig Dispense Refill  . apixaban (ELIQUIS) 5 MG TABS tablet Take 10mg  po bid for 7 days then 5mg  po bid (Patient taking differently: 5 mg 2 (two) times daily. Take  10mg  po bid for 7 days then 5mg  po bid) 60 tablet 1  . gabapentin (NEURONTIN) 300 MG capsule Take 1 capsule by mouth 3 (three) times daily.    [name] glipiZIDE (GLUCOTROL XL) 5 MG 24 hr tablet Take 10 mg by mouth 2 (two) times daily.     [name] levothyroxine (SYNTHROID) 25 MCG tablet Take 1 tablet (25 mcg total) by mouth daily before breakfast. 30 tablet 11  . lisinopril (PRINIVIL,ZESTRIL) 5 MG tablet Take 5 mg by mouth daily.    . metFORMIN (GLUCOPHAGE-XR) 500 MG 24 hr tablet Take 2 tablets by mouth 2 (two) times daily.    . pantoprazole (PROTONIX) 40 MG tablet Take 40 mg by mouth 2 (two) times daily.     . prochlorperazine (COMPAZINE) 10 MG tablet Take 1 tablet (10 mg total) by mouth every 6 (six) hours as needed for nausea or vomiting. 30 tablet 0  . rOPINIRole (REQUIP) 3 MG tablet Take 3 mg by mouth 4 (four) times daily as needed ([place]).     . rosuvastatin (CRESTOR) 20 MG tablet Take 1 tablet (20 mg total) by mouth daily. 30 tablet 11  . sertraline (ZOLOFT) 25 MG tablet     . VENTOLIN HFA 108 (90 Base) MCG/ACT inhaler     . ZETIA 10 MG tablet Take 10 mg by mouth daily.     No current facility-administered medications for this visit.     SURGICAL HISTORY:  Past Surgical History:  Procedure Laterality Date  . OTHER SURGICAL HISTORY    . OTHER SURGICAL HISTORY    .  OTHER SURGICAL HISTORY      REVIEW OF SYSTEMS:   Review of Systems  Constitutional: Negative for appetite change, chills, fatigue, fever and unexpected weight change.  HENT:   Negative for mouth sores, nosebleeds, sore throat and trouble swallowing.   Eyes: Negative for eye problems and icterus.  Respiratory: Negative for hemoptysis, shortness of breath at rest and wheezing.  Positive for cough and shortness of breath with exertion. Cardiovascular: Negative for chest pain and leg swelling.  Gastrointestinal: Negative for abdominal pain, constipation, diarrhea, nausea and vomiting.  Genitourinary: Negative for bladder  incontinence, difficulty urinating, dysuria, frequency and hematuria.   Musculoskeletal: Negative for back pain, gait problem, neck pain and neck stiffness.  Skin: Negative for itching and rash.  Neurological: Negative for dizziness, extremity weakness, gait problem, headaches, light-headedness and seizures.  Hematological: Negative for adenopathy. Does not bruise/bleed easily.  Psychiatric/Behavioral: Negative for confusion, depression and sleep disturbance. The patient is not nervous/anxious.     PHYSICAL EXAMINATION:  Blood pressure (!) 159/77, pulse 78, temperature 97.7 F (36.5 C), temperature source Oral, resp. rate 18, height 5\' 8"  (1.727 m), weight 153 lb 11.2 oz (69.7 kg), SpO2 97 %.  ECOG PERFORMANCE STATUS: 1 - Symptomatic but completely ambulatory  Physical Exam  Constitutional: Oriented to person, place, and time and well-developed, well-nourished, and in no distress. No distress.  HENT:  Head: Normocephalic and atraumatic.  Mouth/Throat: Oropharynx is clear and moist. No oropharyngeal exudate.  Eyes: Conjunctivae are normal. Right eye exhibits no discharge. Left eye exhibits no discharge. No scleral icterus.  Neck: Normal range of motion. Neck supple.  Cardiovascular: Normal rate, regular rhythm, normal heart sounds and intact distal pulses.   Pulmonary/Chest: Effort normal and breath sounds normal. No respiratory distress. No wheezes. No rales.  Abdominal: Soft. Bowel sounds are normal. Exhibits no distension and no mass. There is no tenderness.  Musculoskeletal: Normal range of motion. Exhibits no edema.  Lymphadenopathy:    No cervical adenopathy.  Neurological: Alert and oriented to person, place, and time. Exhibits normal muscle tone. Gait normal. Coordination normal.  Skin: Skin is warm and dry. No rash noted. Not diaphoretic. No erythema. No pallor.  Psychiatric: Mood, memory and judgment normal.  Vitals reviewed.  LABORATORY DATA: Lab Results  Component Value  Date   WBC 5.7 03/23/2017   HGB 14.1 03/23/2017   HCT 41.2 03/23/2017   MCV 89.7 03/23/2017   PLT 234 03/23/2017      Chemistry      Component Value Date/Time   NA 138 03/23/2017 0932   K 3.8 03/23/2017 0932   CL 109 10/14/2016 1859   CO2 19 (L) 03/23/2017 0932   BUN 16.5 03/23/2017 0932   CREATININE 0.9 03/23/2017 0932      Component Value Date/Time   CALCIUM 9.6 03/23/2017 0932   ALKPHOS 95 03/23/2017 0932   AST 12 03/23/2017 0932   ALT 7 03/23/2017 0932   BILITOT 0.56 03/23/2017 0932       RADIOGRAPHIC STUDIES:  No results found.   ASSESSMENT/PLAN:  Adenocarcinoma of right lung, stage 3 ([facility]) This is a very pleasant 71 year old white male with a stage IIIa non-small cell lung cancer, squamous cell carcinoma  The patient underwent a course of concurrent chemoradiation with weekly carboplatin and paclitaxel status post 7 cycles and tolerated his treatment fairly well. He had repeat CT scan of the chest that showed improvement of his disease.   The patient is on consolidation immunotherapy with Imfinzi (Durvalumab) 10  MG/KG every 2 weeks for a total of 1 year if there is no evidence for disease progression or unacceptable toxicity. Tolerating his treatment well.   The patient was seen with Dr. Julien Nordmann.  Recommend that he proceed with cycle 3 as scheduled today.  The patient will follow-up in 2 weeks for evaluation prior to cycle 4 of Imfinzi.  For smoke cessation, I strongly encouraged the patient to quit smoking and offered him a smoke cessation program but the patient declined and not willing to quit smoking. He was advised to call immediately if he has any concerning symptoms in the interval.  The patient voices understanding of current disease status and treatment options and is in agreement with the current care plan. All questions were answered. The patient knows to call the clinic with any problems, questions or concerns. We can certainly see the patient  much sooner if necessary.  No orders of the defined types were placed in this encounter.  Mikey Bussing, DNP, AGPCNP-BC, AOCNP 03/23/17  ADDENDUM: Hematology/Oncology Attending: I had a face-to-face encounter with the patient.  I recommended his care plan.  This is a very pleasant 71 years old white male with a stage IIIa non-small cell lung cancer, squamous cell carcinoma status post concurrent chemoradiation with weekly carboplatin and paclitaxel.  The patient is currently undergoing treatment with consolidation immunotherapy was Imfinzi (Durvalumab) every 2 weeks status post 2 cycles.  He has been tolerating this treatment fairly well with no significant adverse effects. I recommended for him to proceed with cycle #3 today as a schedule. For smoking cessation, I strongly encouraged the patient to quit smoking. He will come back for follow-up visit in 2 weeks for evaluation before the next cycle of his treatment. He was advised to call immediately if he has any concerning symptoms in the interval.  Disclaimer: This note was dictated with voice recognition software. Similar sounding words can inadvertently be transcribed and may be missed upon review. Eilleen Kempf, MD 03/24/17

## 2017-03-23 NOTE — Assessment & Plan Note (Signed)
This is a very pleasant 71 year old white male with a stage IIIa non-small cell lung cancer, squamous cell carcinoma  The patient underwent a course of concurrent chemoradiation with weekly carboplatin and paclitaxel status post 7 cycles and tolerated his treatment fairly well. He had repeat CT scan of the chest that showed improvement of his disease.   The patient is on consolidation immunotherapy with Imfinzi (Durvalumab) 10 MG/KG every 2 weeks for a total of 1 year if there is no evidence for disease progression or unacceptable toxicity. Tolerating his treatment well.   The patient was seen with Dr. Julien Nordmann.  Recommend that he proceed with cycle 3 as scheduled today.  The patient will follow-up in 2 weeks for evaluation prior to cycle 4 of Imfinzi.  For smoke cessation, I strongly encouraged the patient to quit smoking and offered him a smoke cessation program but the patient declined and not willing to quit smoking. He was advised to call immediately if he has any concerning symptoms in the interval.  The patient voices understanding of current disease status and treatment options and is in agreement with the current care plan. All questions were answered. The patient knows to call the clinic with any problems, questions or concerns. We can certainly see the patient much sooner if necessary.

## 2017-04-04 ENCOUNTER — Encounter: Payer: Self-pay | Admitting: *Deleted

## 2017-04-04 NOTE — Progress Notes (Signed)
Oncology Nurse Navigator Documentation  Oncology Nurse Navigator Flowsheets 04/04/2017  Navigator Location [facility]  Navigator Encounter Type Other/per Dr. Julien Nordmann, I reached out to [facility].  They requested an update on stage of disease. I updated them.   Treatment Phase Treatment  Barriers/Navigation Needs Coordination of Care  Interventions Coordination of Care  Coordination of Care Other  Acuity Level 2  Time Spent with Patient 67

## 2017-04-05 ENCOUNTER — Other Ambulatory Visit (HOSPITAL_BASED_OUTPATIENT_CLINIC_OR_DEPARTMENT_OTHER): Payer: Medicare HMO

## 2017-04-05 ENCOUNTER — Encounter: Payer: Self-pay | Admitting: Internal Medicine

## 2017-04-05 ENCOUNTER — Ambulatory Visit (HOSPITAL_BASED_OUTPATIENT_CLINIC_OR_DEPARTMENT_OTHER): Payer: Medicare HMO | Admitting: Internal Medicine

## 2017-04-05 ENCOUNTER — Ambulatory Visit (HOSPITAL_BASED_OUTPATIENT_CLINIC_OR_DEPARTMENT_OTHER): Payer: Medicare HMO

## 2017-04-05 ENCOUNTER — Other Ambulatory Visit: Payer: Self-pay | Admitting: Medical Oncology

## 2017-04-05 VITALS — BP 148/69 | HR 83 | Temp 97.5°F | Resp 18 | Ht 68.0 in | Wt 152.5 lb

## 2017-04-05 DIAGNOSIS — Z5112 Encounter for antineoplastic immunotherapy: Secondary | ICD-10-CM | POA: Diagnosis not present

## 2017-04-05 DIAGNOSIS — Z006 Encounter for examination for normal comparison and control in clinical research program: Secondary | ICD-10-CM

## 2017-04-05 DIAGNOSIS — C342 Malignant neoplasm of middle lobe, bronchus or lung: Secondary | ICD-10-CM

## 2017-04-05 DIAGNOSIS — C3491 Malignant neoplasm of unspecified part of right bronchus or lung: Secondary | ICD-10-CM

## 2017-04-05 DIAGNOSIS — F17200 Nicotine dependence, unspecified, uncomplicated: Secondary | ICD-10-CM | POA: Diagnosis not present

## 2017-04-05 DIAGNOSIS — R69 Illness, unspecified: Secondary | ICD-10-CM | POA: Diagnosis not present

## 2017-04-05 LAB — CBC WITH DIFFERENTIAL/PLATELET
BASO%: 1.1 % (ref 0.0–2.0)
Basophils Absolute: 0.1 10*3/uL (ref 0.0–0.1)
EOS%: 2 % (ref 0.0–7.0)
Eosinophils Absolute: 0.1 10*3/uL (ref 0.0–0.5)
HEMATOCRIT: 41.6 % (ref 38.4–49.9)
HEMOGLOBIN: 14.3 g/dL (ref 13.0–17.1)
LYMPH#: 0.8 10*3/uL — AB (ref 0.9–3.3)
LYMPH%: 13.5 % — ABNORMAL LOW (ref 14.0–49.0)
MCH: 30.5 pg (ref 27.2–33.4)
MCHC: 34.4 g/dL (ref 32.0–36.0)
MCV: 88.7 fL (ref 79.3–98.0)
MONO#: 0.5 10*3/uL (ref 0.1–0.9)
MONO%: 9.6 % (ref 0.0–14.0)
NEUT%: 73.8 % (ref 39.0–75.0)
NEUTROS ABS: 4.2 10*3/uL (ref 1.5–6.5)
Platelets: 236 10*3/uL (ref 140–400)
RBC: 4.69 10*6/uL (ref 4.20–5.82)
RDW: 12.9 % (ref 11.0–14.6)
WBC: 5.6 10*3/uL (ref 4.0–10.3)

## 2017-04-05 LAB — COMPREHENSIVE METABOLIC PANEL
ALBUMIN: 4 g/dL (ref 3.5–5.0)
ALK PHOS: 94 U/L (ref 40–150)
ALT: 10 U/L (ref 0–55)
AST: 14 U/L (ref 5–34)
Anion Gap: 13 mEq/L — ABNORMAL HIGH (ref 3–11)
BILIRUBIN TOTAL: 0.59 mg/dL (ref 0.20–1.20)
BUN: 17.5 mg/dL (ref 7.0–26.0)
CO2: 18 mEq/L — ABNORMAL LOW (ref 22–29)
CREATININE: 1.1 mg/dL (ref 0.7–1.3)
Calcium: 9.9 mg/dL (ref 8.4–10.4)
Chloride: 105 mEq/L (ref 98–109)
EGFR: >60 mL/min/{1.73_m2} (ref >60)
GLUCOSE: 193 mg/dL — AB (ref 70–140)
POTASSIUM: 3.9 meq/L (ref 3.5–5.1)
Sodium: 136 mEq/L (ref 136–145)
TOTAL PROTEIN: 8.1 g/dL (ref 6.4–8.3)

## 2017-04-05 LAB — RESEARCH LABS

## 2017-04-05 MED ORDER — SODIUM CHLORIDE 0.9 % IV SOLN
Freq: Once | INTRAVENOUS | Status: AC
  Administered 2017-04-05: 13:00:00 via INTRAVENOUS

## 2017-04-05 MED ORDER — SODIUM CHLORIDE 0.9 % IV SOLN
740.0000 mg | Freq: Once | INTRAVENOUS | Status: AC
  Administered 2017-04-05: 740 mg via INTRAVENOUS
  Filled 2017-04-05: qty 4.8

## 2017-04-05 NOTE — Patient Instructions (Signed)
[facility] Discharge Instructions for Patients Receiving Chemotherapy  Today you received the following chemotherapy agents: Imfinzi.  To help prevent nausea and vomiting after your treatment, we encourage you to take your nausea medication as directed.   If you develop nausea and vomiting that is not controlled by your nausea medication, call the clinic.   BELOW ARE SYMPTOMS THAT SHOULD BE REPORTED IMMEDIATELY:  *FEVER GREATER THAN 100.5 F  *CHILLS WITH OR WITHOUT FEVER  NAUSEA AND VOMITING THAT IS NOT CONTROLLED WITH YOUR NAUSEA MEDICATION  *UNUSUAL SHORTNESS OF BREATH  *UNUSUAL BRUISING OR BLEEDING  TENDERNESS IN MOUTH AND THROAT WITH OR WITHOUT PRESENCE OF ULCERS  *URINARY PROBLEMS  *BOWEL PROBLEMS  UNUSUAL RASH Items with * indicate a potential emergency and should be followed up as soon as possible.  Feel free to call the clinic should you have any questions or concerns. The clinic phone number is [phone].  Please show the CHEMO ALERT CARD at check-in to the Emergency Department and triage nurse.   

## 2017-04-05 NOTE — Progress Notes (Signed)
[place] [phone]) [phone]   Fax:(336) [phone]  OFFICE PROGRESS NOTE  [name], MD [address] [place] [address]  DIAGNOSIS: Stage IIIA (T1a, N2, M0) non-small cell lung cancer, squamous cell carcinoma presented with right middle lobe pulmonary nodule in addition to ipsilateral hilar and ipsilateral mediastinal lymphadenopathy diagnosed in June 2018.  PRIOR THERAPY:  A course of concurrent chemoradiation with weekly carboplatin for AUC of 2 and paclitaxel 45 MG/M2. Status post 7 cycles.  CURRENT THERAPY: Consolidation immunotherapy with Imfinzi (Durvalumab) 10 MG/KG every 2 weeks.first dose 02/22/2017.  Status post 3 cycles.  INTERVAL HISTORY: Matthew Wilkins 71 y.o. male returns to the clinic today for follow-up visit.  The patient is currently undergoing consolidation immunotherapy was Imfinzi (Durvalumab) status post 3 cycles and has been tolerating this treatment fairly well with no significant adverse effects.  Unfortunately he continues to smoke at regular basis and not willing to quit smoking.  He denied having any chest pain but continues to have shortness of breath at baseline increased with exertion with mild cough and no hemoptysis.  He denied having any fever or chills.  He has no nausea, vomiting, diarrhea or constipation.  Is here today for evaluation before starting cycle #4 of his treatment with immunotherapy.   MEDICAL HISTORY: Past Medical History:  Diagnosis Date  . Adenocarcinoma of right lung, stage 3 ([place]) 11/17/2016  . Anxiety   . Arthritis   . Coronary artery disease   . Diabetes mellitus   . DVT (deep venous thrombosis) ([place])   . Encounter for antineoplastic chemotherapy 11/17/2016  . History of radiation therapy 11/28/16-01/06/17   right lung was treated to 60 Gy in 30 fractions  . Hypercholesterolemia   . Hypertension   . PAD (peripheral artery disease) ([place])   . SOB (shortness of breath)     ALLERGIES:  is  allergic to aspirin.  MEDICATIONS:  Current Outpatient Medications  Medication Sig Dispense Refill  . apixaban (ELIQUIS) 5 MG TABS tablet Take 10mg  po bid for 7 days then 5mg  po bid (Patient taking differently: 5 mg 2 (two) times daily. Take 10mg  po bid for 7 days then 5mg  po bid) 60 tablet 1  . gabapentin (NEURONTIN) 300 MG capsule Take 1 capsule by mouth 3 (three) times daily.    [name] glipiZIDE (GLUCOTROL XL) 5 MG 24 hr tablet Take 10 mg by mouth 2 (two) times daily.     [name] levothyroxine (SYNTHROID) 25 MCG tablet Take 1 tablet (25 mcg total) by mouth daily before breakfast. 30 tablet 11  . lisinopril (PRINIVIL,ZESTRIL) 5 MG tablet Take 5 mg by mouth daily.    . metFORMIN (GLUCOPHAGE-XR) 500 MG 24 hr tablet Take 2 tablets by mouth 2 (two) times daily.    . pantoprazole (PROTONIX) 40 MG tablet Take 40 mg by mouth 2 (two) times daily.     . prochlorperazine (COMPAZINE) 10 MG tablet Take 1 tablet (10 mg total) by mouth every 6 (six) hours as needed for nausea or vomiting. 30 tablet 0  . rOPINIRole (REQUIP) 3 MG tablet Take 3 mg by mouth 4 (four) times daily as needed (Restless Leg).     . rosuvastatin (CRESTOR) 20 MG tablet Take 1 tablet (20 mg total) by mouth daily. 30 tablet 11  . sertraline (ZOLOFT) 25 MG tablet     . VENTOLIN HFA 108 (90 Base) MCG/ACT inhaler     . ZETIA 10 MG tablet Take 10  mg by mouth daily.     No current facility-administered medications for this visit.     SURGICAL HISTORY:  Past Surgical History:  Procedure Laterality Date  . CARDIAC CATHETERIZATION N/A 06/10/2015   Procedure: Left Heart Cath and Coronary Angiography;  Surgeon: Burnell Blanks, MD;  Location: [facility];  Service: Cardiovascular;  Laterality: N/A;  . LOWER EXTREMITY ANGIOGRAM N/A 07/13/2011   Procedure: LOWER EXTREMITY ANGIOGRAM;  Surgeon: Burnell Blanks, MD;  Location: [facility] CATH LAB;  Service: Cardiovascular;  Laterality: N/A;  . OTHER SURGICAL HISTORY    . OTHER SURGICAL HISTORY     . OTHER SURGICAL HISTORY    . PERCUTANEOUS STENT INTERVENTION Right 07/13/2011   Procedure: PERCUTANEOUS STENT INTERVENTION;  Surgeon: Burnell Blanks, MD;  Location: [facility] CATH LAB;  Service: Cardiovascular;  Laterality: Right;    REVIEW OF SYSTEMS:  A comprehensive review of systems was negative except for: Constitutional: positive for fatigue Respiratory: positive for cough and dyspnea on exertion   PHYSICAL EXAMINATION: General appearance: alert, cooperative, fatigued and no distress Head: Normocephalic, without obvious abnormality, atraumatic Neck: no adenopathy, no JVD, supple, symmetrical, trachea midline and thyroid not enlarged, symmetric, no tenderness/mass/nodules Lymph nodes: Cervical, supraclavicular, and axillary nodes normal. Resp: clear to auscultation bilaterally Back: symmetric, no curvature. ROM normal. No CVA tenderness. Cardio: regular rate and rhythm, S1, S2 normal, no murmur, click, rub or gallop GI: soft, non-tender; bowel sounds normal; no masses,  no organomegaly Extremities: extremities normal, atraumatic, no cyanosis or edema  ECOG PERFORMANCE STATUS: 1 - Symptomatic but completely ambulatory  Blood pressure (!) 148/69, pulse 83, temperature (!) 97.5 F (36.4 C), temperature source Oral, resp. rate 18, height 5\' 8"  (1.727 m), weight 152 lb 8 oz (69.2 kg), SpO2 96 %.  LABORATORY DATA: Lab Results  Component Value Date   WBC 5.6 04/05/2017   HGB 14.3 04/05/2017   HCT 41.6 04/05/2017   MCV 88.7 04/05/2017   PLT 236 04/05/2017      Chemistry      Component Value Date/Time   NA 136 04/05/2017 1019   K 3.9 04/05/2017 1019   CL 109 10/14/2016 1859   CO2 18 (L) 04/05/2017 1019   BUN 17.5 04/05/2017 1019   CREATININE 1.1 04/05/2017 1019      Component Value Date/Time   CALCIUM 9.9 04/05/2017 1019   ALKPHOS 94 04/05/2017 1019   AST 14 04/05/2017 1019   ALT 10 04/05/2017 1019   BILITOT 0.59 04/05/2017 1019       RADIOGRAPHIC STUDIES: No  results found.  ASSESSMENT AND PLAN: This is a very pleasant 71 years old white male with a stage IIIa non-small cell lung cancer, squamous cell carcinoma  The patient underwent a course of concurrent chemoradiation with weekly carboplatin and paclitaxel status post 7 cycles. He had partial response after this treatment.  The patient is currently undergoing consolidation immunotherapy was Imfinzi (Durvalumab) status post 3 cycles and has been tolerating this treatment fairly well. I recommended for the patient to proceed with cycle #4 today as a scheduled. I will see the patient back for follow-up visit in 2 weeks for evaluation before starting cycle #5. For smoking cessation, I strongly recommend for the patient to quit smoking but he is not willing to consider smoke cessation. He was advised to call immediately if he has any concerning symptoms in the interval. The patient voices understanding of current disease status and treatment options and is in agreement with the current care  plan. All questions were answered. The patient knows to call the clinic with any problems, questions or concerns. We can certainly see the patient much sooner if necessary.  Disclaimer: This note was dictated with voice recognition software. Similar sounding words can inadvertently be transcribed and may not be corrected upon review.

## 2017-04-13 ENCOUNTER — Encounter: Payer: Self-pay | Admitting: *Deleted

## 2017-04-20 ENCOUNTER — Ambulatory Visit (HOSPITAL_BASED_OUTPATIENT_CLINIC_OR_DEPARTMENT_OTHER): Payer: Medicare HMO | Admitting: Internal Medicine

## 2017-04-20 ENCOUNTER — Other Ambulatory Visit (HOSPITAL_BASED_OUTPATIENT_CLINIC_OR_DEPARTMENT_OTHER): Payer: Medicare HMO

## 2017-04-20 ENCOUNTER — Ambulatory Visit (HOSPITAL_BASED_OUTPATIENT_CLINIC_OR_DEPARTMENT_OTHER): Payer: Medicare HMO

## 2017-04-20 ENCOUNTER — Encounter: Payer: Self-pay | Admitting: Internal Medicine

## 2017-04-20 VITALS — BP 135/58 | HR 61 | Resp 18 | Ht 68.0 in | Wt 150.1 lb

## 2017-04-20 DIAGNOSIS — Z79899 Other long term (current) drug therapy: Secondary | ICD-10-CM | POA: Diagnosis not present

## 2017-04-20 DIAGNOSIS — C342 Malignant neoplasm of middle lobe, bronchus or lung: Secondary | ICD-10-CM | POA: Diagnosis not present

## 2017-04-20 DIAGNOSIS — R5382 Chronic fatigue, unspecified: Secondary | ICD-10-CM

## 2017-04-20 DIAGNOSIS — Z5112 Encounter for antineoplastic immunotherapy: Secondary | ICD-10-CM

## 2017-04-20 DIAGNOSIS — C3491 Malignant neoplasm of unspecified part of right bronchus or lung: Secondary | ICD-10-CM

## 2017-04-20 LAB — COMPREHENSIVE METABOLIC PANEL
ALT: 8 U/L (ref 0–55)
ANION GAP: 13 meq/L — AB (ref 3–11)
AST: 14 U/L (ref 5–34)
Albumin: 3.9 g/dL (ref 3.5–5.0)
Alkaline Phosphatase: 91 U/L (ref 40–150)
BUN: 12.4 mg/dL (ref 7.0–26.0)
CALCIUM: 9.8 mg/dL (ref 8.4–10.4)
CHLORIDE: 106 meq/L (ref 98–109)
CO2: 18 mEq/L — ABNORMAL LOW (ref 22–29)
Creatinine: 1.1 mg/dL (ref 0.7–1.3)
Glucose: 201 mg/dl — ABNORMAL HIGH (ref 70–140)
POTASSIUM: 3.8 meq/L (ref 3.5–5.1)
Sodium: 137 mEq/L (ref 136–145)
Total Bilirubin: 0.47 mg/dL (ref 0.20–1.20)
Total Protein: 7.6 g/dL (ref 6.4–8.3)

## 2017-04-20 LAB — CBC WITH DIFFERENTIAL/PLATELET
BASO%: 1.2 % (ref 0.0–2.0)
Basophils Absolute: 0.1 10e3/uL (ref 0.0–0.1)
EOS%: 1.9 % (ref 0.0–7.0)
Eosinophils Absolute: 0.1 10e3/uL (ref 0.0–0.5)
HCT: 41.7 % (ref 38.4–49.9)
HGB: 14.2 g/dL (ref 13.0–17.1)
LYMPH%: 16.3 % (ref 14.0–49.0)
MCH: 29.8 pg (ref 27.2–33.4)
MCHC: 34.1 g/dL (ref 32.0–36.0)
MCV: 87.4 fL (ref 79.3–98.0)
MONO#: 0.4 10e3/uL (ref 0.1–0.9)
MONO%: 7.8 % (ref 0.0–14.0)
NEUT#: 3.5 10e3/uL (ref 1.5–6.5)
NEUT%: 72.8 % (ref 39.0–75.0)
Platelets: 227 10e3/uL (ref 140–400)
RBC: 4.77 10e6/uL (ref 4.20–5.82)
RDW: 13 % (ref 11.0–14.6)
WBC: 4.9 10e3/uL (ref 4.0–10.3)
lymph#: 0.8 10e3/uL — ABNORMAL LOW (ref 0.9–3.3)

## 2017-04-20 LAB — TSH: TSH: 6.029 m(IU)/L — ABNORMAL HIGH (ref 0.320–4.118)

## 2017-04-20 MED ORDER — DURVALUMAB 500 MG/10ML IV SOLN
10.4000 mg/kg | Freq: Once | INTRAVENOUS | Status: AC
  Administered 2017-04-20: 740 mg via INTRAVENOUS
  Filled 2017-04-20: qty 4.8

## 2017-04-20 MED ORDER — SODIUM CHLORIDE 0.9 % IV SOLN
Freq: Once | INTRAVENOUS | Status: AC
Start: 2017-04-20 — End: 2017-04-20
  Administered 2017-04-20: 12:00:00 via INTRAVENOUS

## 2017-04-20 NOTE — Progress Notes (Signed)
[place] Telephone:(336) [phone]   Fax:(336) [phone]  OFFICE PROGRESS NOTE  [name], MD [address] [place] [address]  DIAGNOSIS: Stage IIIA (T1a, N2, M0) non-small cell lung cancer, squamous cell carcinoma presented with right middle lobe pulmonary nodule in addition to ipsilateral hilar and ipsilateral mediastinal lymphadenopathy diagnosed in June 2018.  PRIOR THERAPY:  A course of concurrent chemoradiation with weekly carboplatin for AUC of 2 and paclitaxel 45 MG/M2. Status post 7 cycles.  CURRENT THERAPY: Consolidation immunotherapy with Imfinzi (Durvalumab) 10 MG/KG every 2 weeks.first dose 02/22/2017.  Status post 4 cycles.  INTERVAL HISTORY: Matthew Wilkins 71 y.o. male returns to the clinic today for follow-up visit.  The patient is feeling fine today except for mild fatigue.  He continues to tolerate his immunotherapy fairly well.  He also continues to smoke at regular basis and is not willing to quit smoking.  He denied having any chest pain, shortness breath, cough or hemoptysis.  He denied having any weight loss or night sweats.  He has no nausea, vomiting, diarrhea or constipation.  He is here today for evaluation before starting cycle #5.  MEDICAL HISTORY: Past Medical History:  Diagnosis Date  . Adenocarcinoma of right lung, stage 3 ([place]) 11/17/2016  . Anxiety   . Arthritis   . Coronary artery disease   . Diabetes mellitus   . DVT (deep venous thrombosis) ([place])   . Encounter for antineoplastic chemotherapy 11/17/2016  . History of radiation therapy 11/28/16-01/06/17   right lung was treated to 60 Gy in 30 fractions  . Hypercholesterolemia   . Hypertension   . PAD (peripheral artery disease) ([place])   . SOB (shortness of breath)     ALLERGIES:  is allergic to aspirin.  MEDICATIONS:  Current Outpatient Medications  Medication Sig Dispense Refill  . apixaban (ELIQUIS) 5 MG TABS tablet Take 10mg  po bid for 7 days then 5mg  po bid  (Patient taking differently: 5 mg 2 (two) times daily. Take 10mg  po bid for 7 days then 5mg  po bid) 60 tablet 1  . gabapentin (NEURONTIN) 300 MG capsule Take 1 capsule by mouth 3 (three) times daily.    [name] glipiZIDE (GLUCOTROL XL) 5 MG 24 hr tablet Take 10 mg by mouth 2 (two) times daily.     [name] levothyroxine (SYNTHROID) 25 MCG tablet Take 1 tablet (25 mcg total) by mouth daily before breakfast. 30 tablet 11  . lisinopril (PRINIVIL,ZESTRIL) 5 MG tablet Take 5 mg by mouth daily.    . metFORMIN (GLUCOPHAGE-XR) 500 MG 24 hr tablet Take 2 tablets by mouth 2 (two) times daily.    . pantoprazole (PROTONIX) 40 MG tablet Take 40 mg by mouth 2 (two) times daily.     [name] rOPINIRole (REQUIP) 3 MG tablet Take 3 mg by mouth 4 (four) times daily as needed (Restless Leg).     . rosuvastatin (CRESTOR) 20 MG tablet Take 1 tablet (20 mg total) by mouth daily. 30 tablet 11  . sertraline (ZOLOFT) 25 MG tablet     . VENTOLIN HFA 108 (90 Base) MCG/ACT inhaler     . ZETIA 10 MG tablet Take 10 mg by mouth daily.    . prochlorperazine (COMPAZINE) 10 MG tablet Take 1 tablet (10 mg total) by mouth every 6 (six) hours as needed for nausea or vomiting. (Patient not taking: Reported on 04/20/2017) 30 tablet 0   No current facility-administered medications for this visit.  SURGICAL HISTORY:  Past Surgical History:  Procedure Laterality Date  . CARDIAC CATHETERIZATION N/A 06/10/2015   Procedure: Left Heart Cath and Coronary Angiography;  Surgeon: Burnell Blanks, MD;  Location: [facility];  Service: Cardiovascular;  Laterality: N/A;  . LOWER EXTREMITY ANGIOGRAM N/A 07/13/2011   Procedure: LOWER EXTREMITY ANGIOGRAM;  Surgeon: Burnell Blanks, MD;  Location: [facility] CATH LAB;  Service: Cardiovascular;  Laterality: N/A;  . OTHER SURGICAL HISTORY    . OTHER SURGICAL HISTORY    . OTHER SURGICAL HISTORY    . PERCUTANEOUS STENT INTERVENTION Right 07/13/2011   Procedure: PERCUTANEOUS STENT INTERVENTION;  Surgeon:  Burnell Blanks, MD;  Location: [facility] CATH LAB;  Service: Cardiovascular;  Laterality: Right;    REVIEW OF SYSTEMS:  A comprehensive review of systems was negative except for: Constitutional: positive for fatigue   PHYSICAL EXAMINATION: General appearance: alert, cooperative, fatigued and no distress Head: Normocephalic, without obvious abnormality, atraumatic Neck: no adenopathy, no JVD, supple, symmetrical, trachea midline and thyroid not enlarged, symmetric, no tenderness/mass/nodules Lymph nodes: Cervical, supraclavicular, and axillary nodes normal. Resp: clear to auscultation bilaterally Back: symmetric, no curvature. ROM normal. No CVA tenderness. Cardio: regular rate and rhythm, S1, S2 normal, no murmur, click, rub or gallop GI: soft, non-tender; bowel sounds normal; no masses,  no organomegaly Extremities: extremities normal, atraumatic, no cyanosis or edema  ECOG PERFORMANCE STATUS: 1 - Symptomatic but completely ambulatory  Blood pressure (!) 135/58, pulse 61, resp. rate 18, height 5\' 8"  (1.727 m), weight 150 lb 1.6 oz (68.1 kg), SpO2 100 %.  LABORATORY DATA: Lab Results  Component Value Date   WBC 4.9 04/20/2017   HGB 14.2 04/20/2017   HCT 41.7 04/20/2017   MCV 87.4 04/20/2017   PLT 227 04/20/2017      Chemistry      Component Value Date/Time   NA 137 04/20/2017 0953   K 3.8 04/20/2017 0953   CL 109 10/14/2016 1859   CO2 18 (L) 04/20/2017 0953   BUN 12.4 04/20/2017 0953   CREATININE 1.1 04/20/2017 0953      Component Value Date/Time   CALCIUM 9.8 04/20/2017 0953   ALKPHOS 91 04/20/2017 0953   AST 14 04/20/2017 0953   ALT 8 04/20/2017 0953   BILITOT 0.47 04/20/2017 0953       RADIOGRAPHIC STUDIES: No results found.  ASSESSMENT AND PLAN: This is a very pleasant 71 years old white male with a stage IIIa non-small cell lung cancer, squamous cell carcinoma  The patient underwent a course of concurrent chemoradiation with weekly carboplatin and paclitaxel  status post 7 cycles. He had partial response after this treatment.  The patient is currently undergoing consolidation immunotherapy was Imfinzi (Durvalumab) status post 4 cycles. The patient tolerated the last cycle of his treatment fairly well with no significant adverse effects. I recommended for him to proceed with cycle #5 today. I would see the patient back for follow-up visit in 2 weeks for evaluation with the start of cycle #6. He was advised to call immediately if he has any concerning symptoms in the interval. The patient voices understanding of current disease status and treatment options and is in agreement with the current care plan. All questions were answered. The patient knows to call the clinic with any problems, questions or concerns. We can certainly see the patient much sooner if necessary.  Disclaimer: This note was dictated with voice recognition software. Similar sounding words can inadvertently be transcribed and may not be corrected upon  review.

## 2017-04-20 NOTE — Patient Instructions (Signed)
Steps to Quit Smoking Smoking tobacco can be bad for your health. It can also affect almost every organ in your body. Smoking puts you and people around you at risk for many serious long-lasting (chronic) diseases. Quitting smoking is hard, but it is one of the best things that you can do for your health. It is never too late to quit. What are the benefits of quitting smoking? When you quit smoking, you lower your risk for getting serious diseases and conditions. They can include:  Lung cancer or lung disease.  Heart disease.  Stroke.  Heart attack.  Not being able to have children (infertility).  Weak bones (osteoporosis) and broken bones (fractures).  If you have coughing, wheezing, and shortness of breath, those symptoms may get better when you quit. You may also get sick less often. If you are pregnant, quitting smoking can help to lower your chances of having a baby of low birth weight. What can I do to help me quit smoking? Talk with your doctor about what can help you quit smoking. Some things you can do (strategies) include:  Quitting smoking totally, instead of slowly cutting back how much you smoke over a period of time.  Going to in-person counseling. You are more likely to quit if you go to many counseling sessions.  Using resources and support systems, such as: ? Online chats with a counselor. ? Phone quitlines. ? Printed self-help materials. ? Support groups or group counseling. ? Text messaging programs. ? Mobile phone apps or applications.  Taking medicines. Some of these medicines may have nicotine in them. If you are pregnant or breastfeeding, do not take any medicines to quit smoking unless your doctor says it is okay. Talk with your doctor about counseling or other things that can help you.  Talk with your doctor about using more than one strategy at the same time, such as taking medicines while you are also going to in-person counseling. This can help make  quitting easier. What things can I do to make it easier to quit? Quitting smoking might feel very hard at first, but there is a lot that you can do to make it easier. Take these steps:  Talk to your family and friends. Ask them to support and encourage you.  Call phone quitlines, reach out to support groups, or work with a counselor.  Ask people who smoke to not smoke around you.  Avoid places that make you want (trigger) to smoke, such as: ? Bars. ? Parties. ? Smoke-break areas at work.  Spend time with people who do not smoke.  Lower the stress in your life. Stress can make you want to smoke. Try these things to help your stress: ? Getting regular exercise. ? Deep-breathing exercises. ? Yoga. ? Meditating. ? Doing a body scan. To do this, close your eyes, focus on one area of your body at a time from head to toe, and notice which parts of your body are tense. Try to relax the muscles in those areas.  Download or buy apps on your mobile phone or tablet that can help you stick to your quit plan. There are many free apps, such as QuitGuide from the CDC (Centers for Disease Control and Prevention). You can find more support from smokefree.gov and other websites.  This information is not intended to replace advice given to you by your health care provider. Make sure you discuss any questions you have with your health care provider. Document Released: 02/26/2009 Document   Revised: 12/29/2015 Document Reviewed: 09/16/2014 Elsevier Interactive Patient Education  2018 Elsevier Inc.  

## 2017-04-20 NOTE — Patient Instructions (Signed)
[facility] Discharge Instructions for Patients Receiving Chemotherapy  Today you received the following chemotherapy agent: Imfinzi  To help prevent nausea and vomiting after your treatment, we encourage you to take your nausea medication as directed   If you develop nausea and vomiting that is not controlled by your nausea medication, call the clinic.   BELOW ARE SYMPTOMS THAT SHOULD BE REPORTED IMMEDIATELY:  *FEVER GREATER THAN 100.5 F  *CHILLS WITH OR WITHOUT FEVER  NAUSEA AND VOMITING THAT IS NOT CONTROLLED WITH YOUR NAUSEA MEDICATION  *UNUSUAL SHORTNESS OF BREATH  *UNUSUAL BRUISING OR BLEEDING  TENDERNESS IN MOUTH AND THROAT WITH OR WITHOUT PRESENCE OF ULCERS  *URINARY PROBLEMS  *BOWEL PROBLEMS  UNUSUAL RASH Items with * indicate a potential emergency and should be followed up as soon as possible.  Feel free to call the clinic should you have any questions or concerns. The clinic phone number is [phone].  Please show the [place] at check-in to the Emergency Department and triage nurse.

## 2017-04-21 DIAGNOSIS — I10 Essential (primary) hypertension: Secondary | ICD-10-CM | POA: Diagnosis not present

## 2017-04-21 DIAGNOSIS — Z23 Encounter for immunization: Secondary | ICD-10-CM | POA: Diagnosis not present

## 2017-04-21 DIAGNOSIS — Z8673 Personal history of transient ischemic attack (TIA), and cerebral infarction without residual deficits: Secondary | ICD-10-CM | POA: Diagnosis not present

## 2017-04-21 DIAGNOSIS — K219 Gastro-esophageal reflux disease without esophagitis: Secondary | ICD-10-CM | POA: Diagnosis not present

## 2017-04-21 DIAGNOSIS — Z8679 Personal history of other diseases of the circulatory system: Secondary | ICD-10-CM | POA: Diagnosis not present

## 2017-04-21 DIAGNOSIS — E782 Mixed hyperlipidemia: Secondary | ICD-10-CM | POA: Diagnosis not present

## 2017-04-21 DIAGNOSIS — E039 Hypothyroidism, unspecified: Secondary | ICD-10-CM | POA: Diagnosis not present

## 2017-04-21 DIAGNOSIS — E1142 Type 2 diabetes mellitus with diabetic polyneuropathy: Secondary | ICD-10-CM | POA: Diagnosis not present

## 2017-04-21 DIAGNOSIS — Z7901 Long term (current) use of anticoagulants: Secondary | ICD-10-CM | POA: Diagnosis not present

## 2017-04-21 DIAGNOSIS — E1165 Type 2 diabetes mellitus with hyperglycemia: Secondary | ICD-10-CM | POA: Diagnosis not present

## 2017-04-21 DIAGNOSIS — Z86718 Personal history of other venous thrombosis and embolism: Secondary | ICD-10-CM | POA: Diagnosis not present

## 2017-04-21 DIAGNOSIS — Z86711 Personal history of pulmonary embolism: Secondary | ICD-10-CM | POA: Diagnosis not present

## 2017-05-04 ENCOUNTER — Other Ambulatory Visit (HOSPITAL_BASED_OUTPATIENT_CLINIC_OR_DEPARTMENT_OTHER): Payer: Medicare HMO

## 2017-05-04 ENCOUNTER — Ambulatory Visit (HOSPITAL_BASED_OUTPATIENT_CLINIC_OR_DEPARTMENT_OTHER): Payer: Medicare HMO

## 2017-05-04 ENCOUNTER — Ambulatory Visit (HOSPITAL_BASED_OUTPATIENT_CLINIC_OR_DEPARTMENT_OTHER): Payer: Medicare HMO | Admitting: Oncology

## 2017-05-04 VITALS — BP 150/69 | HR 67 | Resp 18 | Ht 68.0 in | Wt 147.5 lb

## 2017-05-04 DIAGNOSIS — Z5112 Encounter for antineoplastic immunotherapy: Secondary | ICD-10-CM | POA: Diagnosis not present

## 2017-05-04 DIAGNOSIS — R5382 Chronic fatigue, unspecified: Secondary | ICD-10-CM

## 2017-05-04 DIAGNOSIS — C342 Malignant neoplasm of middle lobe, bronchus or lung: Secondary | ICD-10-CM

## 2017-05-04 DIAGNOSIS — C3491 Malignant neoplasm of unspecified part of right bronchus or lung: Secondary | ICD-10-CM

## 2017-05-04 DIAGNOSIS — R5383 Other fatigue: Secondary | ICD-10-CM | POA: Diagnosis not present

## 2017-05-04 LAB — CBC WITH DIFFERENTIAL/PLATELET
BASO%: 1 % (ref 0.0–2.0)
BASOS ABS: 0.1 10*3/uL (ref 0.0–0.1)
EOS ABS: 0.1 10*3/uL (ref 0.0–0.5)
EOS%: 1.5 % (ref 0.0–7.0)
HEMATOCRIT: 44.4 % (ref 38.4–49.9)
HGB: 15.3 g/dL (ref 13.0–17.1)
LYMPH#: 0.7 10*3/uL — AB (ref 0.9–3.3)
LYMPH%: 11.9 % — ABNORMAL LOW (ref 14.0–49.0)
MCH: 29.4 pg (ref 27.2–33.4)
MCHC: 34.5 g/dL (ref 32.0–36.0)
MCV: 85.4 fL (ref 79.3–98.0)
MONO#: 0.5 10*3/uL (ref 0.1–0.9)
MONO%: 8.5 % (ref 0.0–14.0)
NEUT#: 4.5 10*3/uL (ref 1.5–6.5)
NEUT%: 77.1 % — AB (ref 39.0–75.0)
PLATELETS: 192 10*3/uL (ref 140–400)
RBC: 5.2 10*6/uL (ref 4.20–5.82)
RDW: 13.3 % (ref 11.0–14.6)
WBC: 5.9 10*3/uL (ref 4.0–10.3)

## 2017-05-04 LAB — COMPREHENSIVE METABOLIC PANEL
ALK PHOS: 93 U/L (ref 40–150)
ALT: 7 U/L (ref 0–55)
ANION GAP: 14 meq/L — AB (ref 3–11)
AST: 13 U/L (ref 5–34)
Albumin: 3.9 g/dL (ref 3.5–5.0)
BUN: 15.5 mg/dL (ref 7.0–26.0)
CALCIUM: 9.6 mg/dL (ref 8.4–10.4)
CHLORIDE: 106 meq/L (ref 98–109)
CO2: 16 mEq/L — ABNORMAL LOW (ref 22–29)
Creatinine: 1.1 mg/dL (ref 0.7–1.3)
Glucose: 183 mg/dl — ABNORMAL HIGH (ref 70–140)
POTASSIUM: 4 meq/L (ref 3.5–5.1)
Sodium: 136 mEq/L (ref 136–145)
Total Bilirubin: 0.56 mg/dL (ref 0.20–1.20)
Total Protein: 7.6 g/dL (ref 6.4–8.3)

## 2017-05-04 MED ORDER — SODIUM CHLORIDE 0.9 % IV SOLN
Freq: Once | INTRAVENOUS | Status: AC
  Administered 2017-05-04: 12:00:00 via INTRAVENOUS

## 2017-05-04 MED ORDER — SODIUM CHLORIDE 0.9 % IV SOLN
740.0000 mg | Freq: Once | INTRAVENOUS | Status: AC
  Administered 2017-05-04: 740 mg via INTRAVENOUS
  Filled 2017-05-04: qty 10

## 2017-05-04 NOTE — Patient Instructions (Addendum)
[facility] Discharge Instructions for Patients Receiving Chemotherapy  Today you received the following chemotherapy agents: Imfinzi.  To help prevent nausea and vomiting after your treatment, we encourage you to take your nausea medication as directed.   If you develop nausea and vomiting that is not controlled by your nausea medication, call the clinic.   BELOW ARE SYMPTOMS THAT SHOULD BE REPORTED IMMEDIATELY:  *FEVER GREATER THAN 100.5 F  *CHILLS WITH OR WITHOUT FEVER  NAUSEA AND VOMITING THAT IS NOT CONTROLLED WITH YOUR NAUSEA MEDICATION  *UNUSUAL SHORTNESS OF BREATH  *UNUSUAL BRUISING OR BLEEDING  TENDERNESS IN MOUTH AND THROAT WITH OR WITHOUT PRESENCE OF ULCERS  *URINARY PROBLEMS  *BOWEL PROBLEMS  UNUSUAL RASH Items with * indicate a potential emergency and should be followed up as soon as possible.  Feel free to call the clinic should you have any questions or concerns. The clinic phone number is [phone].  Please show the CHEMO ALERT CARD at check-in to the Emergency Department and triage nurse.   

## 2017-05-04 NOTE — Progress Notes (Signed)
[name] OFF[facility] PROGRESS NOTE  [name], MD [address] [place] [address]  DIAGNOSIS: Stage IIIA (T1a, N2, M0) non-small cell lung cancer, squamous cell carcinoma presented with right middle lobe pulmonary nodule in addition to ipsilateral hilar and ipsilateral mediastinal lymphadenopathy diagnosed in June 2018.  PRIOR THERAPY: A course of concurrent chemoradiation with weekly carboplatin for AUC of 2 and paclitaxel 45 MG/M2. Status post 7 cycles.  CURRENT THERAPY: Consolidation immunotherapy with Imfinzi (Durvalumab) 10 MG/KG every 2 weeks.first dose 02/22/2017.  Status post 5 cycles.  INTERVAL HISTORY: Matthew Wilkins 71 y.o. male returns for routine follow-up visit.  The patient is feeling fine today except for mild fatigue.  The patient continues to tolerate his immunotherapy fairly well.  He continues to smoke and is not willing to quit smoking.  He denies fevers and chills.  Denies chest pain, shortness breath, cough, hemoptysis.  Denies nausea, vomiting, constipation, diarrhea.  He denies weight loss or night sweats.  The patient is here for evaluation prior to starting cycle #6.  MEDICAL HISTORY: Past Medical History:  Diagnosis Date  . Adenocarcinoma of right lung, stage 3 ([facility]) 11/17/2016  . Anxiety   . Arthritis   . Coronary artery disease   . Diabetes mellitus   . DVT (deep venous thrombosis) ([facility])   . Encounter for antineoplastic chemotherapy 11/17/2016  . History of radiation therapy 11/28/16-01/06/17   right lung was treated to 60 Gy in 30 fractions  . Hypercholesterolemia   . Hypertension   . PAD (peripheral artery disease) ([facility])   . SOB (shortness of breath)     ALLERGIES:  is allergic to aspirin.  MEDICATIONS:  Current Outpatient Medications  Medication Sig Dispense Refill  . apixaban (ELIQUIS) 5 MG TABS tablet Take 10mg  po bid for 7 days then 5mg  po bid (Patient taking differently: 5 mg 2 (two) times daily. Take 10mg  po bid for 7  days then 5mg  po bid) 60 tablet 1  . gabapentin (NEURONTIN) 300 MG capsule Take 1 capsule by mouth 3 (three) times daily.    [name] glipiZIDE (GLUCOTROL XL) 5 MG 24 hr tablet Take 10 mg by mouth 2 (two) times daily.     [name] levothyroxine (SYNTHROID) 25 MCG tablet Take 1 tablet (25 mcg total) by mouth daily before breakfast. 30 tablet 11  . lisinopril (PRINIVIL,ZESTRIL) 5 MG tablet Take 5 mg by mouth daily.    . metFORMIN (GLUCOPHAGE-XR) 500 MG 24 hr tablet Take 2 tablets by mouth 2 (two) times daily.    . pantoprazole (PROTONIX) 40 MG tablet Take 40 mg by mouth 2 (two) times daily.     . prochlorperazine (COMPAZINE) 10 MG tablet Take 1 tablet (10 mg total) by mouth every 6 (six) hours as needed for nausea or vomiting. (Patient not taking: Reported on 04/20/2017) 30 tablet 0  . rOPINIRole (REQUIP) 3 MG tablet Take 3 mg by mouth 4 (four) times daily as needed ([place]).     . rosuvastatin (CRESTOR) 20 MG tablet Take 1 tablet (20 mg total) by mouth daily. 30 tablet 11  . sertraline (ZOLOFT) 25 MG tablet     . VENTOLIN HFA 108 (90 Base) MCG/ACT inhaler     . ZETIA 10 MG tablet Take 10 mg by mouth daily.     No current facility-administered medications for this visit.     SURGICAL HISTORY:  Past Surgical History:  Procedure Laterality Date  . CARDIAC CATHETERIZATION N/A 06/10/2015  Procedure: Left Heart Cath and Coronary Angiography;  Surgeon: Burnell Blanks, MD;  Location: [facility] CV LAB;  Service: Cardiovascular;  Laterality: N/A;  . LOWER EXTREMITY ANGIOGRAM N/A 07/13/2011   Procedure: LOWER EXTREMITY ANGIOGRAM;  Surgeon: Burnell Blanks, MD;  Location: [facility] CATH LAB;  Service: Cardiovascular;  Laterality: N/A;  . OTHER SURGICAL HISTORY    . OTHER SURGICAL HISTORY    . OTHER SURGICAL HISTORY    . PERCUTANEOUS STENT INTERVENTION Right 07/13/2011   Procedure: PERCUTANEOUS STENT INTERVENTION;  Surgeon: Burnell Blanks, MD;  Location: [facility] CATH LAB;  Service: Cardiovascular;   Laterality: Right;    REVIEW OF SYSTEMS:   Review of Systems  Constitutional: Negative for appetite change, chills, fever and unexpected weight change. Positive for mild fatigue. HENT:   Negative for mouth sores, nosebleeds, sore throat and trouble swallowing.   Eyes: Negative for eye problems and icterus.  Respiratory: Negative for cough, hemoptysis, shortness of breath and wheezing.   Cardiovascular: Negative for chest pain and leg swelling.  Gastrointestinal: Negative for abdominal pain, constipation, diarrhea, nausea and vomiting.  Genitourinary: Negative for bladder incontinence, difficulty urinating, dysuria, frequency and hematuria.   Musculoskeletal: Negative for back pain, gait problem, neck pain and neck stiffness.  Skin: Negative for itching and rash.  Neurological: Negative for dizziness, extremity weakness, gait problem, headaches, light-headedness and seizures.  Hematological: Negative for adenopathy. Does not bruise/bleed easily.  Psychiatric/Behavioral: Negative for confusion, depression and sleep disturbance. The patient is not nervous/anxious.     PHYSICAL EXAMINATION:  Blood pressure (!) 150/69, pulse 67, resp. rate 18, height 5\' 8"  (1.727 m), weight 147 lb 8 oz (66.9 kg), SpO2 100 %.  ECOG PERFORMANCE STATUS: 1 - Symptomatic but completely ambulatory  Physical Exam  Constitutional: Oriented to person, place, and time and well-developed, well-nourished, and in no distress. No distress.  HENT:  Head: Normocephalic and atraumatic.  Mouth/Throat: Oropharynx is clear and moist. No oropharyngeal exudate.  Eyes: Conjunctivae are normal. Right eye exhibits no discharge. Left eye exhibits no discharge. No scleral icterus.  Neck: Normal range of motion. Neck supple.  Cardiovascular: Normal rate, regular rhythm, normal heart sounds and intact distal pulses.   Pulmonary/Chest: Effort normal and breath sounds normal. No respiratory distress. No wheezes. No rales.  Abdominal:  Soft. Bowel sounds are normal. Exhibits no distension and no mass. There is no tenderness.  Musculoskeletal: Normal range of motion. Exhibits no edema.  Lymphadenopathy:    No cervical adenopathy.  Neurological: Alert and oriented to person, place, and time. Exhibits normal muscle tone. Gait normal. Coordination normal.  Skin: Skin is warm and dry. No rash noted. Not diaphoretic. No erythema. No pallor.  Psychiatric: Mood, memory and judgment normal.  Vitals reviewed.  LABORATORY DATA: Lab Results  Component Value Date   WBC 5.9 05/04/2017   HGB 15.3 05/04/2017   HCT 44.4 05/04/2017   MCV 85.4 05/04/2017   PLT 192 05/04/2017      Chemistry      Component Value Date/Time   NA 136 05/04/2017 1036   K 4.0 05/04/2017 1036   CL 109 10/14/2016 1859   CO2 16 (L) 05/04/2017 1036   BUN 15.5 05/04/2017 1036   CREATININE 1.1 05/04/2017 1036      Component Value Date/Time   CALCIUM 9.6 05/04/2017 1036   ALKPHOS 93 05/04/2017 1036   AST 13 05/04/2017 1036   ALT 7 05/04/2017 1036   BILITOT 0.56 05/04/2017 1036       RADIOGRAPHIC STUDIES:  No results found.   ASSESSMENT/PLAN:  Adenocarcinoma of right lung, stage 3 ([facility]) This is a very pleasant 71 year old white male with a stage IIIa non-small cell lung cancer, squamous cell carcinoma  The patient underwent a course of concurrent chemoradiation with weekly carboplatin and paclitaxel status post 7 cycles. He had partial response after this treatment.  The patient is currently undergoing consolidation immunotherapy was Imfinzi (Durvalumab) status post 5 cycles. The patient tolerated the last cycle of his treatment fairly well with no significant adverse effects. I recommended for him to proceed with cycle #6 today.  The patient will have a restaging CT scan of the chest prior to his next visit. He will have a follow-up visit in 2 weeks for evaluation prior to cycle #7 and to review his restaging CT scan results.  He was advised  to call immediately if he has any concerning symptoms in the interval. The patient voices understanding of current disease status and treatment options and is in agreement with the current care plan. All questions were answered. The patient knows to call the clinic with any problems, questions or concerns. We can certainly see the patient much sooner if necessary.  Orders Placed This Encounter  Procedures  . CT CHEST W CONTRAST    Standing Status:   Future    Standing Expiration Date:   05/04/2018    Order Specific Question:   If indicated for the ordered procedure, I authorize the administration of contrast media per Radiology protocol    Answer:   Yes    Order Specific Question:   Preferred imaging location?    Answer:   [facility]    Order Specific Question:   Radiology Contrast Protocol - do NOT remove file path    Answer:   [email or web address]    Order Specific Question:   Reason for Exam additional comments    Answer:   lung cancer. restaging.    Mikey Bussing, DNP, AGPCNP-BC, AOCNP 05/05/17

## 2017-05-05 ENCOUNTER — Telehealth: Payer: Self-pay | Admitting: Internal Medicine

## 2017-05-05 ENCOUNTER — Encounter: Payer: Self-pay | Admitting: Oncology

## 2017-05-05 NOTE — Assessment & Plan Note (Signed)
This is a very pleasant 71 year old white male with a stage IIIa non-small cell lung cancer, squamous cell carcinoma  The patient underwent a course of concurrent chemoradiation with weekly carboplatin and paclitaxel status post 7 cycles. He had partial response after this treatment.  The patient is currently undergoing consolidation immunotherapy was Imfinzi (Durvalumab) status post 5 cycles. The patient tolerated the last cycle of his treatment fairly well with no significant adverse effects. I recommended for him to proceed with cycle #6 today.  The patient will have a restaging CT scan of the chest prior to his next visit. He will have a follow-up visit in 2 weeks for evaluation prior to cycle #7 and to review his restaging CT scan results.  He was advised to call immediately if he has any concerning symptoms in the interval. The patient voices understanding of current disease status and treatment options and is in agreement with the current care plan. All questions were answered. The patient knows to call the clinic with any problems, questions or concerns. We can certainly see the patient much sooner if necessary.

## 2017-05-05 NOTE — Telephone Encounter (Signed)
Scheduled appt per 12/20 los - left patient message with appt date and time.

## 2017-05-18 ENCOUNTER — Other Ambulatory Visit: Payer: Medicare HMO

## 2017-05-18 ENCOUNTER — Ambulatory Visit: Payer: Medicare HMO

## 2017-05-18 ENCOUNTER — Ambulatory Visit: Payer: Medicare HMO | Admitting: Nurse Practitioner

## 2017-05-19 ENCOUNTER — Telehealth: Payer: Self-pay | Admitting: Medical Oncology

## 2017-05-19 ENCOUNTER — Encounter (HOSPITAL_COMMUNITY): Payer: Self-pay

## 2017-05-19 ENCOUNTER — Ambulatory Visit (HOSPITAL_COMMUNITY): Admission: RE | Admit: 2017-05-19 | Payer: Medicare HMO | Source: Ambulatory Visit

## 2017-05-19 ENCOUNTER — Other Ambulatory Visit: Payer: Medicare HMO

## 2017-05-19 ENCOUNTER — Other Ambulatory Visit: Payer: Self-pay | Admitting: Medical Oncology

## 2017-05-19 ENCOUNTER — Ambulatory Visit: Payer: Medicare HMO | Admitting: Internal Medicine

## 2017-05-19 ENCOUNTER — Ambulatory Visit: Payer: Medicare HMO

## 2017-05-19 NOTE — Telephone Encounter (Signed)
Diarrhea-Daughter reports pt cannot come today for appts because she and pt both have diarrhea after eating at a local restaurant , [employer], last night. They are both taking Pepto Bismol. I told her some one from our office will call back to r/s labs, scans and other appt.

## 2017-05-19 NOTE — Telephone Encounter (Signed)
I called pt and left message to return call.

## 2017-05-19 NOTE — Telephone Encounter (Signed)
CT appt for jan 8 at 330 p given to dtr  with instructions to be NPO 4 hours prior to scan.

## 2017-05-23 ENCOUNTER — Ambulatory Visit (HOSPITAL_COMMUNITY)
Admission: RE | Admit: 2017-05-23 | Discharge: 2017-05-23 | Disposition: A | Discharge status: Home / Self Care | Payer: Medicare HMO | Source: Ambulatory Visit | Attending: Oncology | Admitting: Oncology

## 2017-05-23 ENCOUNTER — Encounter (HOSPITAL_COMMUNITY): Payer: Self-pay | Admitting: Radiology

## 2017-05-23 DIAGNOSIS — C3491 Malignant neoplasm of unspecified part of right bronchus or lung: Secondary | ICD-10-CM | POA: Diagnosis not present

## 2017-05-23 DIAGNOSIS — C3411 Malignant neoplasm of upper lobe, right bronchus or lung: Secondary | ICD-10-CM | POA: Diagnosis not present

## 2017-05-23 MED ORDER — IOPAMIDOL (ISOVUE-300) INJECTION 61%
INTRAVENOUS | Status: AC
  Administered 2017-05-23: 75 mL
  Filled 2017-05-23: qty 75

## 2017-06-01 ENCOUNTER — Inpatient Hospital Stay: Payer: Medicare HMO

## 2017-06-01 ENCOUNTER — Inpatient Hospital Stay: Payer: Medicare HMO | Attending: Oncology | Admitting: Oncology

## 2017-06-01 ENCOUNTER — Telehealth: Payer: Self-pay | Admitting: Oncology

## 2017-06-01 ENCOUNTER — Other Ambulatory Visit: Payer: Self-pay | Admitting: *Deleted

## 2017-06-01 ENCOUNTER — Encounter: Payer: Self-pay | Admitting: Oncology

## 2017-06-01 VITALS — BP 147/76 | HR 66 | Temp 97.6°F | Resp 18 | Ht 68.0 in | Wt 147.9 lb

## 2017-06-01 DIAGNOSIS — C3491 Malignant neoplasm of unspecified part of right bronchus or lung: Secondary | ICD-10-CM

## 2017-06-01 DIAGNOSIS — Z79899 Other long term (current) drug therapy: Secondary | ICD-10-CM | POA: Diagnosis not present

## 2017-06-01 DIAGNOSIS — F17200 Nicotine dependence, unspecified, uncomplicated: Secondary | ICD-10-CM

## 2017-06-01 DIAGNOSIS — C342 Malignant neoplasm of middle lobe, bronchus or lung: Secondary | ICD-10-CM | POA: Diagnosis not present

## 2017-06-01 DIAGNOSIS — Z5112 Encounter for antineoplastic immunotherapy: Secondary | ICD-10-CM | POA: Insufficient documentation

## 2017-06-01 DIAGNOSIS — R5382 Chronic fatigue, unspecified: Secondary | ICD-10-CM

## 2017-06-01 LAB — COMPREHENSIVE METABOLIC PANEL
ALT: 8 U/L (ref 0–55)
ANION GAP: 12 — AB (ref 3–11)
AST: 14 U/L (ref 5–34)
Albumin: 4 g/dL (ref 3.5–5.0)
Alkaline Phosphatase: 91 U/L (ref 40–150)
BILIRUBIN TOTAL: 0.5 mg/dL (ref 0.2–1.2)
BUN: 16 mg/dL (ref 7–26)
CO2: 20 mmol/L — ABNORMAL LOW (ref 22–29)
Calcium: 9.5 mg/dL (ref 8.4–10.4)
Chloride: 105 mmol/L (ref 98–109)
Creatinine, Ser: 1.06 mg/dL (ref 0.70–1.30)
Glucose, Bld: 200 mg/dL — ABNORMAL HIGH (ref 70–140)
Potassium: 4 mmol/L (ref 3.5–5.1)
Sodium: 137 mmol/L (ref 136–145)
TOTAL PROTEIN: 7.5 g/dL (ref 6.4–8.3)

## 2017-06-01 LAB — CBC WITH DIFFERENTIAL/PLATELET
BASOS ABS: 0.1 10*3/uL (ref 0.0–0.1)
Basophils Relative: 2 %
EOS PCT: 1 %
Eosinophils Absolute: 0.1 10*3/uL (ref 0.0–0.5)
HCT: 42.3 % (ref 38.4–49.9)
HEMOGLOBIN: 14.3 g/dL (ref 13.0–17.1)
LYMPHS PCT: 10 %
Lymphs Abs: 0.7 10*3/uL — ABNORMAL LOW (ref 0.9–3.3)
MCH: 28.5 pg (ref 27.2–33.4)
MCHC: 33.7 g/dL (ref 32.0–36.0)
MCV: 84.7 fL (ref 79.3–98.0)
Monocytes Absolute: 0.4 10*3/uL (ref 0.1–0.9)
Monocytes Relative: 5 %
NEUTROS PCT: 82 %
Neutro Abs: 6 10*3/uL (ref 1.5–6.5)
PLATELETS: 220 10*3/uL (ref 140–400)
RBC: 5 MIL/uL (ref 4.20–5.82)
RDW: 14.6 % (ref 11.0–15.6)
WBC: 7.3 10*3/uL (ref 4.0–10.3)

## 2017-06-01 LAB — TSH: TSH: 22.503 u[IU]/mL — AB (ref 0.320–4.118)

## 2017-06-01 MED ORDER — LEVOTHYROXINE SODIUM 50 MCG PO TABS: 50.0000 ug | ORAL_TABLET | Freq: Every day | ORAL | 2 refills | Status: DC

## 2017-06-01 MED ORDER — SODIUM CHLORIDE 0.9 % IV SOLN
Freq: Once | INTRAVENOUS | Status: AC
  Administered 2017-06-01: 14:00:00 via INTRAVENOUS

## 2017-06-01 MED ORDER — SODIUM CHLORIDE 0.9 % IV SOLN
10.4000 mg/kg | Freq: Once | INTRAVENOUS | Status: AC
  Administered 2017-06-01: 740 mg via INTRAVENOUS
  Filled 2017-06-01: qty 10

## 2017-06-01 NOTE — Telephone Encounter (Signed)
-----   Message from Maryanna Shape, NP sent at 06/01/2017  2:55 PM EST ----- Regarding: TSH [facility],  Can you please call the patient or his daughter and let him know that we need to adjust his Synthroid.  His med list indicates that he is on 25 mcg daily.  We will need to increase this to 50 mcg daily.  Can you please send a new prescription to his pharmacy.  You can give him #30 with 2 additional refills under my name.  Thanks [employer]

## 2017-06-01 NOTE — Telephone Encounter (Signed)
Scheduled appt per 1/17 los - Patient to get new schedule next visit.

## 2017-06-01 NOTE — Progress Notes (Signed)
Per NP Erasmo Downer ok to treat with TSH of 22.5

## 2017-06-01 NOTE — Assessment & Plan Note (Signed)
This is a very pleasant 72 year old white male with a stage IIIa non-small cell lung cancer, squamous cell carcinoma  The patient underwent a course of concurrent chemoradiation with weekly carboplatin and paclitaxel status post 7 cycles. He had partial response after this treatment.  The patient is currently undergoing consolidation immunotherapy was Imfinzi (Durvalumab) status post6cycles. The patient tolerated the last cycle of his treatment fairly well with no significant adverse effects.  The patient was seen with Dr. Julien Nordmann.  CT scan results were reviewed with the patient and his daughter.  CT scan shows stable disease.  Recommend that he continue treatment with Imfinzi.  He will proceed with cycle #7 today as scheduled.  He will return for a follow-up visit in 2 weeks for evaluation prior to cycle #8.  He was advised to call immediately if he has any concerning symptoms in the interval. The patient voices understanding of current disease status and treatment options and is in agreement with the current care plan. All questions were answered. The patient knows to call the clinic with any problems, questions or concerns. We can certainly see the patient much sooner if necessary.

## 2017-06-01 NOTE — Progress Notes (Signed)
[facility] OFFICE PROGRESS NOTE  Matthew Sacramento, MD [address] [place] [address]  DIAGNOSIS: Stage IIIA (T1a, N2, M0) non-small cell lung cancer, squamous cell carcinoma presented with right middle lobe pulmonary nodule in addition to ipsilateral hilar and ipsilateral mediastinal lymphadenopathy diagnosed in June 2018.  PRIOR THERAPY: A course of concurrent chemoradiation with weekly carboplatin for AUC of 2 and paclitaxel 45 MG/M2. Status post 7 cycles.  CURRENT THERAPY: Consolidation immunotherapy with Imfinzi (Durvalumab) 10 MG/KG every 2 weeks.first dose 02/22/2017. Status post 6cycles.  INTERVAL HISTORY: Matthew Wilkins 72 y.o. male returns for a routine follow up visit accompanied by his daughter.  The patient is feeling fine today except for mild fatigue.  He missed his treatment 2 weeks ago due to GI symptoms secondary to something that he ate.  Symptoms have now resolved.  He denies fevers and chills.  Denies chest pain, shortness breath, cough, hemoptysis.  Denies nausea, vomiting, constipation, diarrhea.  He denies weight loss or night sweats.  The patient continues to smoke and is not willing to quit.  The patient is here for evaluation prior to starting cycle #7 and to review his restaging CT scan of the chest.  MEDICAL HISTORY: Past Medical History:  Diagnosis Date  . Adenocarcinoma of right lung, stage 3 ([facility]) 11/17/2016  . Anxiety   . Arthritis   . Coronary artery disease   . Diabetes mellitus   . DVT (deep venous thrombosis) ([facility])   . Encounter for antineoplastic chemotherapy 11/17/2016  . History of radiation therapy 11/28/16-01/06/17   right lung was treated to 60 Gy in 30 fractions  . Hypercholesterolemia   . Hypertension   . PAD (peripheral artery disease) ([facility])   . SOB (shortness of breath)     ALLERGIES:  is allergic to aspirin.  MEDICATIONS:  Current Outpatient Medications  Medication Sig Dispense Refill  . apixaban (ELIQUIS) 5 MG  TABS tablet Take 10mg  po bid for 7 days then 5mg  po bid (Patient taking differently: 5 mg 2 (two) times daily. Take 10mg  po bid for 7 days then 5mg  po bid) 60 tablet 1  . gabapentin (NEURONTIN) 300 MG capsule Take 1 capsule by mouth 3 (three) times daily.    [name] glipiZIDE (GLUCOTROL XL) 5 MG 24 hr tablet Take 10 mg by mouth 2 (two) times daily.     [name] lisinopril (PRINIVIL,ZESTRIL) 5 MG tablet Take 5 mg by mouth daily.    . metFORMIN (GLUCOPHAGE-XR) 500 MG 24 hr tablet Take 2 tablets by mouth 2 (two) times daily.    . pantoprazole (PROTONIX) 40 MG tablet Take 40 mg by mouth 2 (two) times daily.     . prochlorperazine (COMPAZINE) 10 MG tablet Take 1 tablet (10 mg total) by mouth every 6 (six) hours as needed for nausea or vomiting. 30 tablet 0  . rOPINIRole (REQUIP) 3 MG tablet Take 3 mg by mouth 4 (four) times daily as needed (Restless Leg).     . rosuvastatin (CRESTOR) 20 MG tablet Take 1 tablet (20 mg total) by mouth daily. 30 tablet 11  . sertraline (ZOLOFT) 25 MG tablet     . VENTOLIN HFA 108 (90 Base) MCG/ACT inhaler     . ZETIA 10 MG tablet Take 10 mg by mouth daily.    [name] levothyroxine (SYNTHROID, LEVOTHROID) 50 MCG tablet Take 1 tablet (50 mcg total) by mouth daily before breakfast. 30 tablet 2   No current facility-administered medications for this visit.  SURGICAL HISTORY:  Past Surgical History:  Procedure Laterality Date  . CARDIAC CATHETERIZATION N/A 06/10/2015   Procedure: Left Heart Cath and Coronary Angiography;  Surgeon: Burnell Blanks, MD;  Location: [facility];  Service: Cardiovascular;  Laterality: N/A;  . LOWER EXTREMITY ANGIOGRAM N/A 07/13/2011   Procedure: LOWER EXTREMITY ANGIOGRAM;  Surgeon: Burnell Blanks, MD;  Location: [facility] CATH LAB;  Service: Cardiovascular;  Laterality: N/A;  . OTHER SURGICAL HISTORY    . OTHER SURGICAL HISTORY    . OTHER SURGICAL HISTORY    . PERCUTANEOUS STENT INTERVENTION Right 07/13/2011   Procedure: PERCUTANEOUS STENT  INTERVENTION;  Surgeon: Burnell Blanks, MD;  Location: [facility] CATH LAB;  Service: Cardiovascular;  Laterality: Right;    REVIEW OF SYSTEMS:   Review of Systems  Constitutional: Negative for appetite change, chills, fever and unexpected weight change. Positive for mild fatigue. HENT:   Negative for mouth sores, nosebleeds, sore throat and trouble swallowing.   Eyes: Negative for eye problems and icterus.  Respiratory: Negative for cough, hemoptysis, shortness of breath and wheezing.   Cardiovascular: Negative for chest pain and leg swelling.  Gastrointestinal: Negative for abdominal pain, constipation, diarrhea, nausea and vomiting.  Genitourinary: Negative for bladder incontinence, difficulty urinating, dysuria, frequency and hematuria.   Musculoskeletal: Negative for back pain, gait problem, neck pain and neck stiffness.  Skin: Negative for itching and rash.  Neurological: Negative for dizziness, extremity weakness, gait problem, headaches, light-headedness and seizures.  Hematological: Negative for adenopathy. Does not bruise/bleed easily.  Psychiatric/Behavioral: Negative for confusion, depression and sleep disturbance. The patient is not nervous/anxious.     PHYSICAL EXAMINATION:  Blood pressure (!) 147/76, pulse 66, temperature 97.6 F (36.4 C), temperature source Oral, resp. rate 18, height 5\' 8"  (1.727 m), weight 147 lb 14.4 oz (67.1 kg), SpO2 100 %.  ECOG PERFORMANCE STATUS: 1 - Symptomatic but completely ambulatory  Physical Exam  Constitutional: Oriented to person, place, and time and well-developed, well-nourished, and in no distress. No distress.  HENT:  Head: Normocephalic and atraumatic.  Mouth/Throat: Oropharynx is clear and moist. No oropharyngeal exudate.  Eyes: Conjunctivae are normal. Right eye exhibits no discharge. Left eye exhibits no discharge. No scleral icterus.  Neck: Normal range of motion. Neck supple.  Cardiovascular: Normal rate, regular rhythm,  normal heart sounds and intact distal pulses.   Pulmonary/Chest: [place] normal and breath sounds normal. No respiratory distress. No wheezes. No rales.  Abdominal: Soft. Bowel sounds are normal. Exhibits no distension and no mass. There is no tenderness.  Musculoskeletal: Normal range of motion. Exhibits no edema.  Lymphadenopathy:    No cervical adenopathy.  Neurological: Alert and oriented to person, place, and time. Exhibits normal muscle tone. Gait normal. Coordination normal.  Skin: Skin is warm and dry. No rash noted. Not diaphoretic. No erythema. No pallor.  Psychiatric: Mood, memory and judgment normal.  Vitals reviewed.  LABORATORY DATA: Lab Results  Component Value Date   WBC 7.3 06/01/2017   HGB 14.3 06/01/2017   HCT 42.3 06/01/2017   MCV 84.7 06/01/2017   PLT 220 06/01/2017      Chemistry      Component Value Date/Time   NA 137 06/01/2017 1140   NA 136 05/04/2017 1036   K 4.0 06/01/2017 1140   K 4.0 05/04/2017 1036   CL 105 06/01/2017 1140   CO2 20 (L) 06/01/2017 1140   CO2 16 (L) 05/04/2017 1036   BUN 16 06/01/2017 1140   BUN 15.5 05/04/2017 1036  CREATININE 1.06 06/01/2017 1140   CREATININE 1.1 05/04/2017 1036      Component Value Date/Time   CALCIUM 9.5 06/01/2017 1140   CALCIUM 9.6 05/04/2017 1036   ALKPHOS 91 06/01/2017 1140   ALKPHOS 93 05/04/2017 1036   AST 14 06/01/2017 1140   AST 13 05/04/2017 1036   ALT 8 06/01/2017 1140   ALT 7 05/04/2017 1036   BILITOT 0.5 06/01/2017 1140   BILITOT 0.56 05/04/2017 1036       RADIOGRAPHIC STUDIES:  Ct Chest W Contrast  Result Date: 05/23/2017 CLINICAL DATA:  Right lung cancer. EXAM: CT CHEST WITH CONTRAST TECHNIQUE: Multidetector CT imaging of the chest was performed during intravenous contrast administration. CONTRAST:  39mL ISOVUE-300 IOPAMIDOL (ISOVUE-300) INJECTION 61% COMPARISON:  02/03/2017 FINDINGS: Cardiovascular: The heart size is normal. No pericardial effusion. Coronary artery calcification is  evident. Atherosclerotic calcification is noted in the wall of the thoracic aorta. Mediastinum/Nodes: 8 mm short axis subcarinal lymph node is similar to prior. Small lymph nodes in the right hilum are evident. The esophagus has normal imaging features. There is no axillary lymphadenopathy. Lungs/Pleura: The 2 ill-defined nodules identified in the right upper lobe on the prior study have resolved in the interval. A new ill-defined right upper lobe pulmonary nodule measuring 6 millimetres seen on image 69. Interval development of bandlike interstitial and airspace opacity in the right lung incorporates the 8 mL right middle lobe pulmonary nodule seen on the prior study. The bandlike opacity tracks posteriorly and then cranially up into the superior segment of the right lower lobe. There is some dependent mucus in the right mainstem bronchus. Dependent atelectasis noted left lower lobe. Left upper lobe unremarkable. Changes of centrilobular emphysema noted. Upper Abdomen: 11 mm low-density lesion in the upper pole of the right kidney is compatible with a cyst. Nodular contour of the left adrenal gland is stable. Musculoskeletal: Bone windows reveal no worrisome lytic or sclerotic osseous lesions. IMPRESSION: 1. Interval development of bandlike interstitial and airspace disease in the right lung likely reflecting radiation change. The 8 mm right middle lobe pulmonary nodule seen on the prior study has become incorporated into this opacity. Interstitial airspace disease contiguous with this the tracks posteriorly and medially up into the superior segment of the right lower lobe may also reflect radiation change although infection or organizing pneumonia could have this appearance. 2. The 2 small right upper lobe nodules identified as new on the prior study have resolved in the interval. An additional 6 mL ill-defined pulmonary nodule in the right upper lobe is new on today's study and attention on follow-up recommended.  Electronically Signed   By: Misty Stanley M.D.   On: 05/23/2017 17:02     ASSESSMENT/PLAN:  Adenocarcinoma of right lung, stage 3 (H[facility]) This is a very pleasant 72 year old white male with a stage IIIa non-small cell lung cancer, squamous cell carcinoma  The patient underwent a course of concurrent chemoradiation with weekly carboplatin and paclitaxel status post 7 cycles. He had partial response after this treatment.  The patient is currently undergoing consolidation immunotherapy was Imfinzi (Durvalumab) status post6cycles. The patient tolerated the last cycle of his treatment fairly well with no significant adverse effects.  The patient was seen with Dr. Julien Nordmann.  CT scan results were reviewed with the patient and his daughter.  CT scan shows stable disease.  Recommend that he continue treatment with Imfinzi.  He will proceed with cycle #7 today as scheduled.  He will return for a follow-up  visit in 2 weeks for evaluation prior to cycle #8.  He was advised to call immediately if he has any concerning symptoms in the interval. The patient voices understanding of current disease status and treatment options and is in agreement with the current care plan. All questions were answered. The patient knows to call the clinic with any problems, questions or concerns. We can certainly see the patient much sooner if necessary.  No orders of the defined types were placed in this encounter.   Mikey Bussing, DNP, AGPCNP-BC, AOCNP 06/01/17  ADDENDUM: Hematology/Oncology Attending: I had a face-to-face encounter with the patient.  I recommended his care plan.  This is a very pleasant 72 years old white male with a stage IIIa non-small cell lung cancer, squamous cell carcinoma status post a course of concurrent chemoradiation and he is currently undergoing consolidation immunotherapy with Imfinzi (Durvalumab) status post 6 cycles.  He continues to tolerate his treatment well with no concerning  complaints. The patient had repeat CT scan of the chest performed recently.  I personally and independently reviewed the scans and discussed the results with the patient and his wife. He has a scan showed no clear evidence for disease progression. I recommended for the patient to continue his current treatment with Imfinzi (Durvalumab) and he will proceed with cycle #7 today. For smoking cessation I strongly encouraged the patient to quit smoking and offered him smoke cessation program.  He declined to quit smoking. I will see him back for follow-up visit in 2 weeks for evaluation before starting cycle #8. The patient was advised to call immediately if she has any concerning symptoms in the interval.  Disclaimer: This note was dictated with voice recognition software. Similar sounding words can inadvertently be transcribed and may be missed upon review. Eilleen Kempf, MD 06/02/17

## 2017-06-01 NOTE — Telephone Encounter (Signed)
Called pt lmovm regarding synthroid. Called pt's daughter gave message to pick up synthroid at pharmacy [address].

## 2017-06-01 NOTE — Patient Instructions (Signed)
[facility] Discharge Instructions for Patients Receiving Chemotherapy  Today you received the following chemotherapy agent: Imfinzi  To help prevent nausea and vomiting after your treatment, we encourage you to take your nausea medication as directed If you develop nausea and vomiting that is not controlled by your nausea medication, call the clinic.   BELOW ARE SYMPTOMS THAT SHOULD BE REPORTED IMMEDIATELY:  *FEVER GREATER THAN 100.5 F  *CHILLS WITH OR WITHOUT FEVER  NAUSEA AND VOMITING THAT IS NOT CONTROLLED WITH YOUR NAUSEA MEDICATION  *UNUSUAL SHORTNESS OF BREATH  *UNUSUAL BRUISING OR BLEEDING  TENDERNESS IN MOUTH AND THROAT WITH OR WITHOUT PRESENCE OF ULCERS  *URINARY PROBLEMS  *BOWEL PROBLEMS  UNUSUAL RASH Items with * indicate a potential emergency and should be followed up as soon as possible.  Feel free to call the clinic should you have any questions or concerns. The clinic phone number is [phone].  Please show the [facility] at check-in to the Emergency Department and triage nurse.

## 2017-06-15 ENCOUNTER — Inpatient Hospital Stay: Payer: Medicare HMO

## 2017-06-15 ENCOUNTER — Encounter: Payer: Self-pay | Admitting: Internal Medicine

## 2017-06-15 ENCOUNTER — Inpatient Hospital Stay (HOSPITAL_BASED_OUTPATIENT_CLINIC_OR_DEPARTMENT_OTHER): Payer: Medicare HMO | Admitting: Internal Medicine

## 2017-06-15 ENCOUNTER — Other Ambulatory Visit: Payer: Medicare HMO

## 2017-06-15 ENCOUNTER — Ambulatory Visit: Payer: Medicare HMO

## 2017-06-15 ENCOUNTER — Telehealth: Payer: Self-pay | Admitting: Internal Medicine

## 2017-06-15 ENCOUNTER — Ambulatory Visit: Payer: Medicare HMO | Admitting: Internal Medicine

## 2017-06-15 VITALS — BP 138/68 | HR 78 | Temp 98.0°F | Resp 18 | Ht 68.0 in | Wt 147.0 lb

## 2017-06-15 DIAGNOSIS — C3491 Malignant neoplasm of unspecified part of right bronchus or lung: Secondary | ICD-10-CM

## 2017-06-15 DIAGNOSIS — Z79899 Other long term (current) drug therapy: Secondary | ICD-10-CM | POA: Diagnosis not present

## 2017-06-15 DIAGNOSIS — R5382 Chronic fatigue, unspecified: Secondary | ICD-10-CM

## 2017-06-15 DIAGNOSIS — Z5112 Encounter for antineoplastic immunotherapy: Secondary | ICD-10-CM | POA: Diagnosis not present

## 2017-06-15 DIAGNOSIS — C342 Malignant neoplasm of middle lobe, bronchus or lung: Secondary | ICD-10-CM

## 2017-06-15 LAB — COMPREHENSIVE METABOLIC PANEL
ALT: 6 U/L (ref 0–55)
AST: 14 U/L (ref 5–34)
Albumin: 4.2 g/dL (ref 3.5–5.0)
Alkaline Phosphatase: 104 U/L (ref 40–150)
Anion gap: 13 — ABNORMAL HIGH (ref 3–11)
BILIRUBIN TOTAL: 0.6 mg/dL (ref 0.2–1.2)
BUN: 17 mg/dL (ref 7–26)
CALCIUM: 10 mg/dL (ref 8.4–10.4)
CO2: 22 mmol/L (ref 22–29)
CREATININE: 1.27 mg/dL (ref 0.70–1.30)
Chloride: 100 mmol/L (ref 98–109)
GFR, EST NON AFRICAN AMERICAN: 54 mL/min — AB (ref >60)
Glucose, Bld: 235 mg/dL — ABNORMAL HIGH (ref 70–140)
Potassium: 4.8 mmol/L (ref 3.5–5.1)
Sodium: 135 mmol/L — ABNORMAL LOW (ref 136–145)
Total Protein: 8.1 g/dL (ref 6.4–8.3)

## 2017-06-15 LAB — CBC WITH DIFFERENTIAL/PLATELET
BASOS PCT: 1 %
Basophils Absolute: 0.1 10*3/uL (ref 0.0–0.1)
EOS ABS: 0.1 10*3/uL (ref 0.0–0.5)
Eosinophils Relative: 2 %
HCT: 44 % (ref 38.4–49.9)
HEMOGLOBIN: 15.1 g/dL (ref 13.0–17.1)
Lymphocytes Relative: 10 %
Lymphs Abs: 0.7 10*3/uL — ABNORMAL LOW (ref 0.9–3.3)
MCH: 29.2 pg (ref 27.2–33.4)
MCHC: 34.3 g/dL (ref 32.0–36.0)
MCV: 85.1 fL (ref 79.3–98.0)
MONOS PCT: 8 %
Monocytes Absolute: 0.6 10*3/uL (ref 0.1–0.9)
NEUTROS PCT: 81 %
Neutro Abs: 5.9 10*3/uL (ref 1.5–6.5)
Platelets: 222 10*3/uL (ref 140–400)
RBC: 5.17 MIL/uL (ref 4.20–5.82)
RDW: 14.3 % (ref 11.0–14.6)
WBC: 7.3 10*3/uL (ref 4.0–10.3)

## 2017-06-15 MED ORDER — SODIUM CHLORIDE 0.9 % IV SOLN
740.0000 mg | Freq: Once | INTRAVENOUS | Status: AC
  Administered 2017-06-15: 740 mg via INTRAVENOUS
  Filled 2017-06-15: qty 4.8

## 2017-06-15 MED ORDER — SODIUM CHLORIDE 0.9 % IV SOLN
Freq: Once | INTRAVENOUS | Status: AC
  Administered 2017-06-15: 13:00:00 via INTRAVENOUS

## 2017-06-15 NOTE — Telephone Encounter (Signed)
per 06/15/17 los - appts already scheduled - no additional appts to schedule.

## 2017-06-15 NOTE — Progress Notes (Signed)
[facility] [phone]   [phone]  OFFICE PROGRESS NOTE  [name], MD [address] [place] [address]  DIAGNOSIS: Stage IIIA (T1a, N2, M0) non-small cell lung cancer, squamous cell carcinoma presented with right middle lobe pulmonary nodule in addition to ipsilateral hilar and ipsilateral mediastinal lymphadenopathy diagnosed in June 2018.  PRIOR THERAPY:  A course of concurrent chemoradiation with weekly carboplatin for AUC of 2 and paclitaxel 45 MG/M2. Status post 7 cycles.  CURRENT THERAPY: Consolidation immunotherapy with Imfinzi (Durvalumab) 10 MG/KG every 2 weeks.first dose 02/22/2017.  Status post 7 cycles.  INTERVAL HISTORY: Matthew Wilkins 72 y.o. male returns to the clinic today for follow-up visit accompanied by his daughter.  The patient has no complaints today except for the baseline shortness of breath.  He continues to smoke at regular basis and not willing to quit.  He denied having any chest pain, cough or hemoptysis.  He has no nausea, vomiting, diarrhea or constipation.  He continues to tolerate his treatment with Imfinzi (Durvalumab) fairly well.  He is here today for evaluation before starting cycle #8.  MEDICAL HISTORY: Past Medical History:  Diagnosis Date  . Adenocarcinoma of right lung, stage 3 ([facility]) 11/17/2016  . Anxiety   . Arthritis   . Coronary artery disease   . Diabetes mellitus   . DVT (deep venous thrombosis) ([facility])   . Encounter for antineoplastic chemotherapy 11/17/2016  . History of radiation therapy 11/28/16-01/06/17   right lung was treated to 60 Gy in 30 fractions  . Hypercholesterolemia   . Hypertension   . PAD (peripheral artery disease) ([facility])   . SOB (shortness of breath)     ALLERGIES:  is allergic to aspirin.  MEDICATIONS:  Current Outpatient Medications  Medication Sig Dispense Refill  . apixaban (ELIQUIS) 5 MG TABS tablet Take 10mg  po bid for 7 days then 5mg  po bid (Patient taking  differently: 5 mg 2 (two) times daily. Take 10mg  po bid for 7 days then 5mg  po bid) 60 tablet 1  . gabapentin (NEURONTIN) 300 MG capsule Take 1 capsule by mouth 3 (three) times daily.    [name] glipiZIDE (GLUCOTROL XL) 5 MG 24 hr tablet Take 10 mg by mouth 2 (two) times daily.     [name] levothyroxine (SYNTHROID, LEVOTHROID) 50 MCG tablet Take 1 tablet (50 mcg total) by mouth daily before breakfast. 30 tablet 2  . lisinopril (PRINIVIL,ZESTRIL) 5 MG tablet Take 5 mg by mouth daily.    . metFORMIN (GLUCOPHAGE-XR) 500 MG 24 hr tablet Take 2 tablets by mouth 2 (two) times daily.    . pantoprazole (PROTONIX) 40 MG tablet Take 40 mg by mouth 2 (two) times daily.     . prochlorperazine (COMPAZINE) 10 MG tablet Take 1 tablet (10 mg total) by mouth every 6 (six) hours as needed for nausea or vomiting. 30 tablet 0  . rOPINIRole (REQUIP) 3 MG tablet Take 3 mg by mouth 4 (four) times daily as needed (Restless Leg).     . rosuvastatin (CRESTOR) 20 MG tablet Take 1 tablet (20 mg total) by mouth daily. 30 tablet 11  . sertraline (ZOLOFT) 25 MG tablet     . VENTOLIN HFA 108 (90 Base) MCG/ACT inhaler     . ZETIA 10 MG tablet Take 10 mg by mouth daily.     No current facility-administered medications for this visit.     SURGICAL HISTORY:  Past Surgical History:  Procedure  Laterality Date  . CARDIAC CATHETERIZATION N/A 06/10/2015   Procedure: Left Heart Cath and Coronary Angiography;  Surgeon: Burnell Blanks, MD;  Location: [facility];  Service: Cardiovascular;  Laterality: N/A;  . LOWER EXTREMITY ANGIOGRAM N/A 07/13/2011   Procedure: LOWER EXTREMITY ANGIOGRAM;  Surgeon: Burnell Blanks, MD;  Location: [facility] CATH LAB;  Service: Cardiovascular;  Laterality: N/A;  . OTHER SURGICAL HISTORY    . OTHER SURGICAL HISTORY    . OTHER SURGICAL HISTORY    . PERCUTANEOUS STENT INTERVENTION Right 07/13/2011   Procedure: PERCUTANEOUS STENT INTERVENTION;  Surgeon: Burnell Blanks, MD;  Location: [facility] CATH LAB;   Service: Cardiovascular;  Laterality: Right;    REVIEW OF SYSTEMS:  A comprehensive review of systems was negative except for: Respiratory: positive for dyspnea on exertion   PHYSICAL EXAMINATION: General appearance: alert, cooperative and no distress Head: Normocephalic, without obvious abnormality, atraumatic Neck: no adenopathy, no JVD, supple, symmetrical, trachea midline and thyroid not enlarged, symmetric, no tenderness/mass/nodules Lymph nodes: Cervical, supraclavicular, and axillary nodes normal. Resp: clear to auscultation bilaterally Back: symmetric, no curvature. ROM normal. No CVA tenderness. Cardio: regular rate and rhythm, S1, S2 normal, no murmur, click, rub or gallop GI: soft, non-tender; bowel sounds normal; no masses,  no organomegaly Extremities: extremities normal, atraumatic, no cyanosis or edema  ECOG PERFORMANCE STATUS: 1 - Symptomatic but completely ambulatory  Blood pressure 138/68, pulse 78, temperature 98 F (36.7 C), resp. rate 18, height 5\' 8"  (1.727 m), weight 147 lb (66.7 kg), SpO2 100 %.  LABORATORY DATA: Lab Results  Component Value Date   WBC 7.3 06/15/2017   HGB 15.1 06/15/2017   HCT 44.0 06/15/2017   MCV 85.1 06/15/2017   PLT 222 06/15/2017      Chemistry      Component Value Date/Time   NA 137 06/01/2017 1140   NA 136 05/04/2017 1036   K 4.0 06/01/2017 1140   K 4.0 05/04/2017 1036   CL 105 06/01/2017 1140   CO2 20 (L) 06/01/2017 1140   CO2 16 (L) 05/04/2017 1036   BUN 16 06/01/2017 1140   BUN 15.5 05/04/2017 1036   CREATININE 1.06 06/01/2017 1140   CREATININE 1.1 05/04/2017 1036      Component Value Date/Time   CALCIUM 9.5 06/01/2017 1140   CALCIUM 9.6 05/04/2017 1036   ALKPHOS 91 06/01/2017 1140   ALKPHOS 93 05/04/2017 1036   AST 14 06/01/2017 1140   AST 13 05/04/2017 1036   ALT 8 06/01/2017 1140   ALT 7 05/04/2017 1036   BILITOT 0.5 06/01/2017 1140   BILITOT 0.56 05/04/2017 1036       RADIOGRAPHIC STUDIES: Ct Chest W  Contrast  Result Date: 05/23/2017 CLINICAL DATA:  Right lung cancer. EXAM: CT CHEST WITH CONTRAST TECHNIQUE: Multidetector CT imaging of the chest was performed during intravenous contrast administration. CONTRAST:  80mL ISOVUE-300 IOPAMIDOL (ISOVUE-300) INJECTION 61% COMPARISON:  02/03/2017 FINDINGS: Cardiovascular: The heart size is normal. No pericardial effusion. Coronary artery calcification is evident. Atherosclerotic calcification is noted in the wall of the thoracic aorta. Mediastinum/Nodes: 8 mm short axis subcarinal lymph node is similar to prior. Small lymph nodes in the right hilum are evident. The esophagus has normal imaging features. There is no axillary lymphadenopathy. Lungs/Pleura: The 2 ill-defined nodules identified in the right upper lobe on the prior study have resolved in the interval. A new ill-defined right upper lobe pulmonary nodule measuring 6 millimetres seen on image 69. Interval development of bandlike interstitial and airspace  opacity in the right lung incorporates the 8 mL right middle lobe pulmonary nodule seen on the prior study. The bandlike opacity tracks posteriorly and then cranially up into the superior segment of the right lower lobe. There is some dependent mucus in the right mainstem bronchus. Dependent atelectasis noted left lower lobe. Left upper lobe unremarkable. Changes of centrilobular emphysema noted. Upper Abdomen: 11 mm low-density lesion in the upper pole of the right kidney is compatible with a cyst. Nodular contour of the left adrenal gland is stable. Musculoskeletal: Bone windows reveal no worrisome lytic or sclerotic osseous lesions. IMPRESSION: 1. Interval development of bandlike interstitial and airspace disease in the right lung likely reflecting radiation change. The 8 mm right middle lobe pulmonary nodule seen on the prior study has become incorporated into this opacity. Interstitial airspace disease contiguous with this the tracks posteriorly and  medially up into the superior segment of the right lower lobe may also reflect radiation change although infection or organizing pneumonia could have this appearance. 2. The 2 small right upper lobe nodules identified as new on the prior study have resolved in the interval. An additional 6 mL ill-defined pulmonary nodule in the right upper lobe is new on today's study and attention on follow-up recommended. Electronically Signed   By: Misty Stanley M.D.   On: 05/23/2017 17:02    ASSESSMENT AND PLAN: This is a very pleasant 72 years old white male with a stage IIIa non-small cell lung cancer, squamous cell carcinoma  The patient underwent a course of concurrent chemoradiation with weekly carboplatin and paclitaxel status post 7 cycles. He had partial response after this treatment.  The patient is currently undergoing consolidation immunotherapy was Imfinzi (Durvalumab) status post 7 cycles. He continues to tolerate his treatment with Imfinzi (Durvalumab) fairly well with no concerning complaints. I recommended for him to proceed with cycle #8 today as a scheduled. I will see the patient back for follow-up visit in 2 weeks for evaluation before starting cycle #9. He was advised to call immediately if he has any concerning symptoms in the interval. The patient voices understanding of current disease status and treatment options and is in agreement with the current care plan. All questions were answered. The patient knows to call the clinic with any problems, questions or concerns. We can certainly see the patient much sooner if necessary.  Disclaimer: This note was dictated with voice recognition software. Similar sounding words can inadvertently be transcribed and may not be corrected upon review.

## 2017-06-15 NOTE — Patient Instructions (Signed)
[facility] Discharge Instructions for Patients Receiving Chemotherapy  Today you received the following chemotherapy agents:  Imfinzi (durvalumab)  To help prevent nausea and vomiting after your treatment, we encourage you to take your nausea medication as prescribed.   If you develop nausea and vomiting that is not controlled by your nausea medication, call the clinic.   BELOW ARE SYMPTOMS THAT SHOULD BE REPORTED IMMEDIATELY:  *FEVER GREATER THAN 100.5 F  *CHILLS WITH OR WITHOUT FEVER  NAUSEA AND VOMITING THAT IS NOT CONTROLLED WITH YOUR NAUSEA MEDICATION  *UNUSUAL SHORTNESS OF BREATH  *UNUSUAL BRUISING OR BLEEDING  TENDERNESS IN MOUTH AND THROAT WITH OR WITHOUT PRESENCE OF ULCERS  *URINARY PROBLEMS  *BOWEL PROBLEMS  UNUSUAL RASH Items with * indicate a potential emergency and should be followed up as soon as possible.  Feel free to call the clinic should you have any questions or concerns. The clinic phone number is [phone].  Please show the [facility] at check-in to the Emergency Department and triage nurse.

## 2017-06-29 ENCOUNTER — Ambulatory Visit: Payer: Medicare HMO | Admitting: Internal Medicine

## 2017-06-29 ENCOUNTER — Ambulatory Visit: Payer: Medicare HMO

## 2017-06-29 ENCOUNTER — Other Ambulatory Visit: Payer: Medicare HMO

## 2017-06-29 ENCOUNTER — Ambulatory Visit: Payer: Medicare HMO | Admitting: Oncology

## 2017-07-13 ENCOUNTER — Encounter (HOSPITAL_COMMUNITY): Payer: Self-pay | Admitting: Emergency Medicine

## 2017-07-13 ENCOUNTER — Ambulatory Visit: Payer: Medicare HMO

## 2017-07-13 ENCOUNTER — Other Ambulatory Visit: Payer: Self-pay

## 2017-07-13 ENCOUNTER — Telehealth: Payer: Self-pay | Admitting: *Deleted

## 2017-07-13 ENCOUNTER — Emergency Department (HOSPITAL_COMMUNITY): Payer: Medicare HMO

## 2017-07-13 ENCOUNTER — Telehealth: Payer: Self-pay | Admitting: Internal Medicine

## 2017-07-13 ENCOUNTER — Ambulatory Visit: Payer: Medicare HMO | Admitting: Oncology

## 2017-07-13 ENCOUNTER — Other Ambulatory Visit: Payer: Medicare HMO

## 2017-07-13 ENCOUNTER — Ambulatory Visit: Payer: Medicare HMO | Admitting: Internal Medicine

## 2017-07-13 ENCOUNTER — Inpatient Hospital Stay (HOSPITAL_COMMUNITY)
Admission: EM | Admit: 2017-07-13 | Discharge: 2017-07-17 | DRG: 193 | Disposition: A | Discharge status: Home / Self Care | Payer: Medicare HMO | Attending: Internal Medicine | Admitting: Internal Medicine

## 2017-07-13 DIAGNOSIS — M1990 Unspecified osteoarthritis, unspecified site: Secondary | ICD-10-CM | POA: Diagnosis present

## 2017-07-13 DIAGNOSIS — I2510 Atherosclerotic heart disease of native coronary artery without angina pectoris: Secondary | ICD-10-CM | POA: Diagnosis present

## 2017-07-13 DIAGNOSIS — C3491 Malignant neoplasm of unspecified part of right bronchus or lung: Secondary | ICD-10-CM | POA: Diagnosis not present

## 2017-07-13 DIAGNOSIS — E119 Type 2 diabetes mellitus without complications: Secondary | ICD-10-CM

## 2017-07-13 DIAGNOSIS — Y95 Nosocomial condition: Secondary | ICD-10-CM | POA: Diagnosis present

## 2017-07-13 DIAGNOSIS — E785 Hyperlipidemia, unspecified: Secondary | ICD-10-CM | POA: Diagnosis present

## 2017-07-13 DIAGNOSIS — F419 Anxiety disorder, unspecified: Secondary | ICD-10-CM | POA: Diagnosis present

## 2017-07-13 DIAGNOSIS — J181 Lobar pneumonia, unspecified organism: Principal | ICD-10-CM | POA: Diagnosis present

## 2017-07-13 DIAGNOSIS — Z9221 Personal history of antineoplastic chemotherapy: Secondary | ICD-10-CM | POA: Diagnosis not present

## 2017-07-13 DIAGNOSIS — Z886 Allergy status to analgesic agent status: Secondary | ICD-10-CM

## 2017-07-13 DIAGNOSIS — I10 Essential (primary) hypertension: Secondary | ICD-10-CM | POA: Diagnosis present

## 2017-07-13 DIAGNOSIS — J9601 Acute respiratory failure with hypoxia: Secondary | ICD-10-CM | POA: Diagnosis present

## 2017-07-13 DIAGNOSIS — E0865 Diabetes mellitus due to underlying condition with hyperglycemia: Secondary | ICD-10-CM | POA: Diagnosis not present

## 2017-07-13 DIAGNOSIS — I959 Hypotension, unspecified: Secondary | ICD-10-CM | POA: Diagnosis present

## 2017-07-13 DIAGNOSIS — Z7984 Long term (current) use of oral hypoglycemic drugs: Secondary | ICD-10-CM | POA: Diagnosis not present

## 2017-07-13 DIAGNOSIS — E039 Hypothyroidism, unspecified: Secondary | ICD-10-CM | POA: Diagnosis present

## 2017-07-13 DIAGNOSIS — F17210 Nicotine dependence, cigarettes, uncomplicated: Secondary | ICD-10-CM | POA: Diagnosis present

## 2017-07-13 DIAGNOSIS — Z923 Personal history of irradiation: Secondary | ICD-10-CM

## 2017-07-13 DIAGNOSIS — J449 Chronic obstructive pulmonary disease, unspecified: Secondary | ICD-10-CM | POA: Diagnosis present

## 2017-07-13 DIAGNOSIS — E440 Moderate protein-calorie malnutrition: Secondary | ICD-10-CM | POA: Diagnosis present

## 2017-07-13 DIAGNOSIS — Z7951 Long term (current) use of inhaled steroids: Secondary | ICD-10-CM

## 2017-07-13 DIAGNOSIS — R627 Adult failure to thrive: Secondary | ICD-10-CM | POA: Diagnosis present

## 2017-07-13 DIAGNOSIS — R531 Weakness: Secondary | ICD-10-CM | POA: Diagnosis not present

## 2017-07-13 DIAGNOSIS — J440 Chronic obstructive pulmonary disease with acute lower respiratory infection: Secondary | ICD-10-CM | POA: Diagnosis present

## 2017-07-13 DIAGNOSIS — E7800 Pure hypercholesterolemia, unspecified: Secondary | ICD-10-CM | POA: Diagnosis present

## 2017-07-13 DIAGNOSIS — Z86718 Personal history of other venous thrombosis and embolism: Secondary | ICD-10-CM

## 2017-07-13 DIAGNOSIS — R05 Cough: Secondary | ICD-10-CM | POA: Diagnosis not present

## 2017-07-13 DIAGNOSIS — Z7901 Long term (current) use of anticoagulants: Secondary | ICD-10-CM

## 2017-07-13 DIAGNOSIS — IMO0002 Reserved for concepts with insufficient information to code with codable children: Secondary | ICD-10-CM | POA: Diagnosis present

## 2017-07-13 DIAGNOSIS — E1151 Type 2 diabetes mellitus with diabetic peripheral angiopathy without gangrene: Secondary | ICD-10-CM | POA: Diagnosis present

## 2017-07-13 DIAGNOSIS — Z66 Do not resuscitate: Secondary | ICD-10-CM | POA: Diagnosis present

## 2017-07-13 DIAGNOSIS — G20 Parkinson's disease: Secondary | ICD-10-CM | POA: Diagnosis present

## 2017-07-13 DIAGNOSIS — N200 Calculus of kidney: Secondary | ICD-10-CM | POA: Diagnosis not present

## 2017-07-13 DIAGNOSIS — R5383 Other fatigue: Secondary | ICD-10-CM | POA: Diagnosis not present

## 2017-07-13 DIAGNOSIS — D638 Anemia in other chronic diseases classified elsewhere: Secondary | ICD-10-CM | POA: Diagnosis present

## 2017-07-13 DIAGNOSIS — J9600 Acute respiratory failure, unspecified whether with hypoxia or hypercapnia: Secondary | ICD-10-CM | POA: Diagnosis present

## 2017-07-13 DIAGNOSIS — J189 Pneumonia, unspecified organism: Secondary | ICD-10-CM | POA: Diagnosis not present

## 2017-07-13 DIAGNOSIS — R0602 Shortness of breath: Secondary | ICD-10-CM | POA: Diagnosis not present

## 2017-07-13 DIAGNOSIS — Z6821 Body mass index (BMI) 21.0-21.9, adult: Secondary | ICD-10-CM

## 2017-07-13 DIAGNOSIS — I739 Peripheral vascular disease, unspecified: Secondary | ICD-10-CM | POA: Diagnosis present

## 2017-07-13 LAB — COMPREHENSIVE METABOLIC PANEL
ALBUMIN: 3.2 g/dL — AB (ref 3.5–5.0)
ALT: 12 U/L — ABNORMAL LOW (ref 17–63)
ANION GAP: 13 (ref 5–15)
AST: 21 U/L (ref 15–41)
Alkaline Phosphatase: 110 U/L (ref 38–126)
BUN: 12 mg/dL (ref 6–20)
CALCIUM: 9 mg/dL (ref 8.9–10.3)
CHLORIDE: 98 mmol/L — AB (ref 101–111)
CO2: 20 mmol/L — AB (ref 22–32)
Creatinine, Ser: 1.09 mg/dL (ref 0.61–1.24)
GFR calc non Af Amer: >60 mL/min (ref >60)
Glucose, Bld: 296 mg/dL — ABNORMAL HIGH (ref 65–99)
POTASSIUM: 3.4 mmol/L — AB (ref 3.5–5.1)
SODIUM: 131 mmol/L — AB (ref 135–145)
Total Bilirubin: 0.6 mg/dL (ref 0.3–1.2)
Total Protein: 7.6 g/dL (ref 6.5–8.1)

## 2017-07-13 LAB — CBC
HCT: 38.3 % — ABNORMAL LOW (ref 39.0–52.0)
HEMOGLOBIN: 12.5 g/dL — AB (ref 13.0–17.0)
MCH: 27.4 pg (ref 26.0–34.0)
MCHC: 32.6 g/dL (ref 30.0–36.0)
MCV: 84 fL (ref 78.0–100.0)
Platelets: 413 10*3/uL — ABNORMAL HIGH (ref 150–400)
RBC: 4.56 MIL/uL (ref 4.22–5.81)
RDW: 13.9 % (ref 11.5–15.5)
WBC: 5.5 10*3/uL (ref 4.0–10.5)

## 2017-07-13 LAB — URINALYSIS, ROUTINE W REFLEX MICROSCOPIC
Bacteria, UA: NONE SEEN
Bilirubin Urine: NEGATIVE
Glucose, UA: >=500 mg/dL — AB
Hgb urine dipstick: NEGATIVE
KETONES UR: NEGATIVE mg/dL
Leukocytes, UA: NEGATIVE
Nitrite: NEGATIVE
PROTEIN: NEGATIVE mg/dL
SQUAMOUS EPITHELIAL / LPF: NONE SEEN
Specific Gravity, Urine: 1.029 (ref 1.005–1.030)
pH: 7 (ref 5.0–8.0)

## 2017-07-13 LAB — PROCALCITONIN: Procalcitonin: <0.1 ng/mL

## 2017-07-13 LAB — CBG MONITORING, ED
GLUCOSE-CAPILLARY: 329 mg/dL — AB (ref 65–99)
GLUCOSE-CAPILLARY: 177 mg/dL — AB (ref 65–99)

## 2017-07-13 LAB — LACTIC ACID, PLASMA
LACTIC ACID, VENOUS: 1.5 mmol/L (ref 0.5–1.9)
Lactic Acid, Venous: 2.1 mmol/L (ref 0.5–1.9)

## 2017-07-13 LAB — TROPONIN I: Troponin I: <0.03 ng/mL (ref <0.03)

## 2017-07-13 MED ORDER — SODIUM CHLORIDE 0.9 % IV BOLUS (SEPSIS)
1000.0000 mL | Freq: Once | INTRAVENOUS | Status: AC
  Administered 2017-07-13: 1000 mL via INTRAVENOUS

## 2017-07-13 MED ORDER — PIPERACILLIN-TAZOBACTAM 3.375 G IVPB
3.3750 g | Freq: Three times a day (TID) | INTRAVENOUS | Status: DC
  Administered 2017-07-14 – 2017-07-16 (×7): 3.375 g via INTRAVENOUS
  Filled 2017-07-13 (×7): qty 50

## 2017-07-13 MED ORDER — APIXABAN 5 MG PO TABS
5.0000 mg | ORAL_TABLET | Freq: Two times a day (BID) | ORAL | Status: DC
  Administered 2017-07-13 – 2017-07-14 (×2): 5 mg via ORAL
  Filled 2017-07-13 (×5): qty 1

## 2017-07-13 MED ORDER — SODIUM CHLORIDE 0.9 % IV SOLN
Freq: Once | INTRAVENOUS | Status: AC
  Administered 2017-07-13: 16:00:00 via INTRAVENOUS

## 2017-07-13 MED ORDER — EZETIMIBE 10 MG PO TABS
10.0000 mg | ORAL_TABLET | Freq: Every day | ORAL | Status: DC
  Administered 2017-07-14 – 2017-07-17 (×4): 10 mg via ORAL
  Filled 2017-07-13 (×5): qty 1

## 2017-07-13 MED ORDER — ACETAMINOPHEN 325 MG PO TABS: 650.0000 mg | ORAL_TABLET | Freq: Four times a day (QID) | ORAL | Status: DC | PRN

## 2017-07-13 MED ORDER — PIPERACILLIN-TAZOBACTAM 3.375 G IVPB 30 MIN
3.3750 g | Freq: Once | INTRAVENOUS | Status: AC
  Administered 2017-07-13: 3.375 g via INTRAVENOUS
  Filled 2017-07-13: qty 50

## 2017-07-13 MED ORDER — ACETAMINOPHEN 650 MG RE SUPP: 650.0000 mg | Freq: Four times a day (QID) | RECTAL | Status: DC | PRN

## 2017-07-13 MED ORDER — VANCOMYCIN HCL 10 G IV SOLR
1250.0000 mg | Freq: Once | INTRAVENOUS | Status: AC
  Administered 2017-07-14: 1250 mg via INTRAVENOUS
  Filled 2017-07-13: qty 1250

## 2017-07-13 MED ORDER — INSULIN ASPART 100 UNIT/ML ~~LOC~~ SOLN
4.0000 [IU] | Freq: Three times a day (TID) | SUBCUTANEOUS | Status: DC
  Administered 2017-07-14: 4 [IU] via SUBCUTANEOUS

## 2017-07-13 MED ORDER — IPRATROPIUM-ALBUTEROL 0.5-2.5 (3) MG/3ML IN SOLN
3.0000 mL | Freq: Four times a day (QID) | RESPIRATORY_TRACT | Status: DC | PRN
  Filled 2017-07-13: qty 3

## 2017-07-13 MED ORDER — SODIUM CHLORIDE 0.9 % IV SOLN
2.0000 g | Freq: Once | INTRAVENOUS | Status: AC
  Administered 2017-07-13: 2 g via INTRAVENOUS
  Filled 2017-07-13: qty 20

## 2017-07-13 MED ORDER — ROPINIROLE HCL 1 MG PO TABS
3.0000 mg | ORAL_TABLET | Freq: Four times a day (QID) | ORAL | Status: DC | PRN
  Filled 2017-07-13: qty 3

## 2017-07-13 MED ORDER — POTASSIUM CHLORIDE 20 MEQ/15ML (10%) PO SOLN
20.0000 meq | Freq: Once | ORAL | Status: AC
  Administered 2017-07-13: 20 meq via ORAL
  Filled 2017-07-13: qty 30

## 2017-07-13 MED ORDER — IOPAMIDOL (ISOVUE-370) INJECTION 76%
100.0000 mL | Freq: Once | INTRAVENOUS | Status: AC | PRN
  Administered 2017-07-13: 100 mL via INTRAVENOUS

## 2017-07-13 MED ORDER — SODIUM CHLORIDE 0.9 % IV SOLN
500.0000 mg | Freq: Once | INTRAVENOUS | Status: AC
  Administered 2017-07-13: 500 mg via INTRAVENOUS
  Filled 2017-07-13: qty 500

## 2017-07-13 MED ORDER — INSULIN ASPART 100 UNIT/ML ~~LOC~~ SOLN: 0.0000 [IU] | Freq: Every day | SUBCUTANEOUS | Status: DC

## 2017-07-13 MED ORDER — ONDANSETRON HCL 4 MG/2ML IJ SOLN: 4.0000 mg | Freq: Four times a day (QID) | INTRAMUSCULAR | Status: DC | PRN

## 2017-07-13 MED ORDER — INSULIN ASPART 100 UNIT/ML ~~LOC~~ SOLN
0.0000 [IU] | Freq: Three times a day (TID) | SUBCUTANEOUS | Status: DC
  Administered 2017-07-14: 4 [IU] via SUBCUTANEOUS
  Administered 2017-07-14: 7 [IU] via SUBCUTANEOUS
  Administered 2017-07-15: 11 [IU] via SUBCUTANEOUS
  Administered 2017-07-15: 3 [IU] via SUBCUTANEOUS
  Administered 2017-07-15 – 2017-07-16 (×3): 4 [IU] via SUBCUTANEOUS
  Administered 2017-07-16: 7 [IU] via SUBCUTANEOUS
  Administered 2017-07-17 (×2): 4 [IU] via SUBCUTANEOUS

## 2017-07-13 MED ORDER — MOMETASONE FURO-FORMOTEROL FUM 200-5 MCG/ACT IN AERO
2.0000 | INHALATION_SPRAY | Freq: Two times a day (BID) | RESPIRATORY_TRACT | Status: DC
  Administered 2017-07-13 – 2017-07-17 (×8): 2 via RESPIRATORY_TRACT
  Filled 2017-07-13 (×2): qty 8.8

## 2017-07-13 MED ORDER — ONDANSETRON HCL 4 MG PO TABS: 4.0000 mg | ORAL_TABLET | Freq: Four times a day (QID) | ORAL | Status: DC | PRN

## 2017-07-13 MED ORDER — LEVOTHYROXINE SODIUM 50 MCG PO TABS
50.0000 ug | ORAL_TABLET | Freq: Every day | ORAL | Status: DC
  Administered 2017-07-14 – 2017-07-17 (×4): 50 ug via ORAL
  Filled 2017-07-13 (×2): qty 2
  Filled 2017-07-13 (×2): qty 1
  Filled 2017-07-13: qty 2

## 2017-07-13 MED ORDER — ROSUVASTATIN CALCIUM 20 MG PO TABS
20.0000 mg | ORAL_TABLET | Freq: Every day | ORAL | Status: DC
Start: 2017-07-13 — End: 2017-07-17
  Administered 2017-07-13 – 2017-07-16 (×4): 20 mg via ORAL
  Filled 2017-07-13 (×4): qty 1

## 2017-07-13 MED ORDER — SERTRALINE HCL 50 MG PO TABS
25.0000 mg | ORAL_TABLET | Freq: Every day | ORAL | Status: DC
Start: 2017-07-14 — End: 2017-07-17
  Administered 2017-07-14 – 2017-07-17 (×4): 25 mg via ORAL
  Filled 2017-07-13 (×4): qty 1

## 2017-07-13 MED ORDER — GABAPENTIN 300 MG PO CAPS
300.0000 mg | ORAL_CAPSULE | Freq: Three times a day (TID) | ORAL | Status: DC
  Administered 2017-07-13 – 2017-07-17 (×11): 300 mg via ORAL
  Filled 2017-07-13 (×11): qty 1

## 2017-07-13 MED ORDER — SODIUM CHLORIDE 0.9 % IV SOLN
INTRAVENOUS | Status: AC
  Administered 2017-07-13: 23:00:00 via INTRAVENOUS

## 2017-07-13 MED ORDER — SODIUM CHLORIDE 0.9 % IV SOLN
500.0000 mg | Freq: Two times a day (BID) | INTRAVENOUS | Status: DC
  Administered 2017-07-14 – 2017-07-16 (×5): 500 mg via INTRAVENOUS
  Filled 2017-07-13 (×7): qty 500

## 2017-07-13 MED ORDER — PANTOPRAZOLE SODIUM 40 MG PO TBEC
40.0000 mg | DELAYED_RELEASE_TABLET | Freq: Two times a day (BID) | ORAL | Status: DC
  Administered 2017-07-13 – 2017-07-17 (×8): 40 mg via ORAL
  Filled 2017-07-13 (×8): qty 1

## 2017-07-13 NOTE — H&P (Addendum)
History and Physical    Matthew Wilkins WIO:[id number] DOB: 31-Oct-1945 DOA: 07/13/2017  PCP: [name], MD   Patient coming from: Home  Chief Complaint: Worsening weakness along with cough and dyspnea  HPI: Matthew Wilkins is a 72 y.o. male with medical history significant for stage IIIa non-small cell lung cancer with ongoing chemoradiation, type 2 diabetes, CAD, COPD, peripheral vascular disease, hypertension, and left lower extremity DVT-currently off Eliquis for the last 2-3 weeks.  He was brought to the emergency department by his daughter who states that the patient has been quite ill and weak over the last 2 weeks and has been unable to keep his appointments with Dr. Earlie Server with oncology.  He actually missed his treatment on 2/14 as well as a follow-up appointment today. He has some mild shortness of breath, but no chest pain. He has a dry cough that is nonproductive. He denies fevers and chills. He has right-sided abdominal pain with poor appetite, no nausea, vomiting, or diarrhea.   ED Course: Vital signs on initial examination reveals some soft blood pressure readings as well as O2 saturation of 87% on room air for which she is currently on 2 L nasal cannula.  Patient does not apparently wear oxygen at home.  Laboratory data demonstrates some mild abnormalities, but no leukocytosis and lactic acid level was 1.5 initially and this increased to 2.1.  Urine analysis is unremarkable aside from some glucosuria.  CT of the chest with contrast was performed to evaluate for PE as patient has been off of his Eliquis and there are no signs of a PE, but there is some findings suggestive of a right lung pneumonia.  CT of the abdomen is unremarkable.  He has received a 2 L fluid bolus and has been started on azithromycin and Rocephin for empiric treatment.  Review of Systems: All others reviewed and are otherwise negative.  Past Medical History:  Diagnosis Date  . Adenocarcinoma of right  lung, stage 3 ([place]) 11/17/2016  . Anxiety   . Arthritis   . Coronary artery disease   . Diabetes mellitus   . DVT (deep venous thrombosis) ([place])   . Encounter for antineoplastic chemotherapy 11/17/2016  . History of radiation therapy 11/28/16-01/06/17   right lung was treated to 60 Gy in 30 fractions  . Hypercholesterolemia   . Hypertension   . PAD (peripheral artery disease) ([place])   . SOB (shortness of breath)     Past Surgical History:  Procedure Laterality Date  . CARDIAC CATHETERIZATION N/A 06/10/2015   Procedure: Left Heart Cath and Coronary Angiography;  Surgeon: Burnell Blanks, MD;  Location: [facility] CV LAB;  Service: Cardiovascular;  Laterality: N/A;  . LOWER EXTREMITY ANGIOGRAM N/A 07/13/2011   Procedure: LOWER EXTREMITY ANGIOGRAM;  Surgeon: Burnell Blanks, MD;  Location[facility] CATH LAB;  Service: Cardiovascular;  Laterality: N/A;  . OTHER SURGICAL HISTORY    . OTHER SURGICAL HISTORY    . OTHER SURGICAL HISTORY    . PERCUTANEOUS STENT INTERVENTION Right 07/13/2011   Procedure: PERCUTANEOUS STENT INTERVENTION;  Surgeon: Burnell Blanks, MD;  Location[facility] CATH LAB;  Service: Cardiovascular;  Laterality: Right;     reports that he has been smoking cigarettes.  He has a 65.00 pack-year smoking history. he has never used smokeless tobacco. He reports that he does not drink alcohol or use drugs.  Allergies  Allergen Reactions  . Aspirin Other (See Comments)    Nose bleeds  Family History  Family history unknown: Yes    Prior to Admission medications   Medication Sig Start Date End Date Taking? Authorizing Provider  albuterol (PROVENTIL HFA;VENTOLIN HFA) 108 (90 Base) MCG/ACT inhaler Inhale into the lungs. 04/21/17  Yes [provider]  apixaban (ELIQUIS) 5 MG TABS tablet Take 10mg  po bid for 7 days then 5mg  po bid Patient taking differently: 5 mg 2 (two) times daily. Take 10mg  po bid for 7 days then 5mg  po bid 08/31/16  Yes Kathie Dike, MD    Fluticasone-Salmeterol (ADVAIR DISKUS) 250-50 MCG/DOSE AEPB Inhale into the lungs. 04/21/17  Yes [provider]  gabapentin (NEURONTIN) 300 MG capsule Take 1 capsule by mouth 3 (three) times daily. 04/19/16  Yes [provider]  glipiZIDE (GLUCOTROL XL) 5 MG 24 hr tablet Take 10 mg by mouth 2 (two) times daily.  03/11/15  Yes [provider]  levothyroxine (SYNTHROID, LEVOTHROID) 50 MCG tablet Take 1 tablet (50 mcg total) by mouth daily before breakfast. 06/01/17  Yes Curcio, Roselie Awkward, NP  lisinopril (PRINIVIL,ZESTRIL) 5 MG tablet Take 5 mg by mouth daily. 05/21/15  Yes [provider]  metFORMIN (GLUCOPHAGE-XR) 500 MG 24 hr tablet Take 2 tablets by mouth 2 (two) times daily. 04/19/16  Yes [provider]  pantoprazole (PROTONIX) 40 MG tablet Take 40 mg by mouth 2 (two) times daily.  04/22/15  Yes [provider]  rOPINIRole (REQUIP) 3 MG tablet Take 3 mg by mouth 4 (four) times daily as needed ([place]).  04/10/15  Yes [provider]  rosuvastatin (CRESTOR) 20 MG tablet Take 1 tablet (20 mg total) by mouth daily. 10/07/14  Yes Burnell Blanks, MD  sertraline (ZOLOFT) 25 MG tablet  10/27/16  Yes [provider]  VENTOLIN HFA 108 (90 Base) MCG/ACT inhaler  10/27/16  Yes [provider]  ZETIA 10 MG tablet Take 10 mg by mouth daily. 04/03/15  Yes [provider]  prochlorperazine (COMPAZINE) 10 MG tablet Take 1 tablet (10 mg total) by mouth every 6 (six) hours as needed for nausea or vomiting. Patient not taking: Reported on 06/15/2017 11/17/16   Curt Bears, MD    Physical Exam: Vitals:   07/13/17 1812 07/13/17 1816 07/13/17 1900 07/13/17 2000  BP: 113/72  (!) 114/50 127/90  Pulse:  74 66 70  Resp: 15 19 (!) 21 (!) 33  Temp:      TempSrc:      SpO2:  99% 100% 100%  Weight:      Height:        Constitutional: NAD, calm, comfortable Vitals:   07/13/17 1812 07/13/17 1816 07/13/17 1900  07/13/17 2000  BP: 113/72  (!) 114/50 127/90  Pulse:  74 66 70  Resp: 15 19 (!) 21 (!) 33  Temp:      TempSrc:      SpO2:  99% 100% 100%  Weight:      Height:       Eyes: lids and conjunctivae normal ENMT: Mucous membranes are moist.  Neck: normal, supple Respiratory: clear to auscultation bilaterally. Normal respiratory effort. No accessory muscle use.  Cardiovascular: Regular rate and rhythm, no murmurs. No extremity edema. Abdomen: no tenderness, no distention. Bowel sounds positive.  Musculoskeletal:  No joint deformity upper and lower extremities.   Skin: no rashes, lesions, ulcers.  Psychiatric: Normal judgment and insight. Alert and oriented x 3. Normal mood.   Labs on Admission: I have personally reviewed following labs and imaging studies  CBC: Recent Labs  Lab 07/13/17 1439  WBC 5.5  HGB 12.5*  HCT 38.3*  MCV 84.0  PLT 563*   Basic Metabolic Panel: Recent Labs  Lab 07/13/17 1439  NA 131*  K 3.4*  CL 98*  CO2 20*  GLUCOSE 296*  BUN 12  CREATININE 1.09  CALCIUM 9.0   GFR: Estimated Creatinine Clearance: 56.9 mL/min (by C-G formula based on SCr of 1.09 mg/dL). Liver Function Tests: Recent Labs  Lab 07/13/17 1439  AST 21  ALT 12*  ALKPHOS 110  BILITOT 0.6  PROT 7.6  ALBUMIN 3.2*   No results for input(s): LIPASE, AMYLASE in the last 168 hours. No results for input(s): AMMONIA in the last 168 hours. Coagulation Profile: No results for input(s): INR, PROTIME in the last 168 hours. Cardiac Enzymes: Recent Labs  Lab 07/13/17 1439  TROPONINI <0.03   BNP (last 3 results) No results for input(s): PROBNP in the last 8760 hours. HbA1C: No results for input(s): HGBA1C in the last 72 hours. CBG: Recent Labs  Lab 07/13/17 1431  GLUCAP 329*   Lipid Profile: No results for input(s): CHOL, HDL, LDLCALC, TRIG, CHOLHDL, LDLDIRECT in the last 72 hours. Thyroid Function Tests: No results for input(s): TSH, T4TOTAL, FREET4, T3FREE, THYROIDAB in the  last 72 hours. Anemia Panel: No results for input(s): VITAMINB12, FOLATE, FERRITIN, TIBC, IRON, RETICCTPCT in the last 72 hours. Urine analysis:    Component Value Date/Time   COLORURINE YELLOW 07/13/2017 1820   APPEARANCEUR CLEAR 07/13/2017 1820   LABSPEC 1.029 07/13/2017 1820   PHURINE 7.0 07/13/2017 1820   GLUCOSEU >=500 (A) 07/13/2017 1820   HGBUR NEGATIVE 07/13/2017 1820   BILIRUBINUR NEGATIVE 07/13/2017 1820   KETONESUR NEGATIVE 07/13/2017 1820   PROTEINUR NEGATIVE 07/13/2017 1820   UROBILINOGEN 0.2 01/27/2010 1529   NITRITE NEGATIVE 07/13/2017 1820   LEUKOCYTESUR NEGATIVE 07/13/2017 1820    Radiological Exams on Admission: Ct Angio Chest Pe W And/or Wo Contrast  Result Date: 07/13/2017 CLINICAL DATA:  Increasing shortness of breath with worsening weakness. History of lung cancer. Abdominal pain. EXAM: CT ANGIOGRAPHY CHEST CT ABDOMEN AND PELVIS WITH CONTRAST TECHNIQUE: Multidetector CT imaging of the chest was performed using the standard protocol during bolus administration of intravenous contrast. Multiplanar CT image reconstructions and MIPs were obtained to evaluate the vascular anatomy. Multidetector CT imaging of the abdomen and pelvis was performed using the standard protocol during bolus administration of intravenous contrast. CONTRAST:  186mL ISOVUE-370 IOPAMIDOL (ISOVUE-370) INJECTION 76% COMPARISON:  Chest CT 05/23/2017.  Abdomen and pelvis CT 08/28/2016. FINDINGS: CTA CHEST FINDINGS Cardiovascular: Heart size normal. No pericardial effusion. Coronary artery calcification is evident. Atherosclerotic calcification is noted in the wall of the thoracic aorta. No filling defect in the opacified pulmonary arteries to suggest the presence of an acute pulmonary embolus. Mediastinum/Nodes: 8 mm subcarinal lymph node measured on the prior study is now 11 mm. No left hilar lymphadenopathy. 7 mm short axis right hilar lymph node evident. Mild circumferential wall thickening noted in the  esophagus with tiny hiatal hernia evident. Lungs/Pleura: Centrilobular and paraseptal emphysema noted in the upper lobes. Similar bandlike interstitial and airspace opacity in the right lung with interval progression of airspace disease in the right lower lobe. Small right pleural effusion evident. Tiny anterior right lung nodule seen on the previous study has become integrated into the confluent bandlike right lung opacity. Musculoskeletal: Bone windows reveal no worrisome lytic or sclerotic osseous lesions. Review of the MIP images confirms the above findings. CT ABDOMEN  and PELVIS FINDINGS Hepatobiliary: No focal abnormality within the liver parenchyma. There is no evidence for gallstones, gallbladder wall thickening, or pericholecystic fluid. No intrahepatic or extrahepatic biliary dilation. Pancreas: No focal mass lesion. No dilatation of the main duct. No intraparenchymal cyst. No peripancreatic edema. Spleen: No splenomegaly. No focal mass lesion. Adrenals/Urinary Tract: No adrenal nodule or mass. Similar appearance 2.3 x 1.4 cm stone in the right renal pelvis with mild fullness right intrarenal collecting system. 5 mm lower pole stone seen right kidney. 14 mm low-density lesion in the upper pole the right kidney is stable. No right hydroureter. Punctate nonobstructing stones seen lower pole left kidney. No left hydroureter. The urinary bladder appears normal for the degree of distention. Stomach/Bowel: Tiny hiatal hernia. Stomach is nondistended. No gastric wall thickening. No evidence of outlet obstruction. Duodenum is normally positioned as is the ligament of Treitz. No small bowel wall thickening. No small bowel dilatation. The appendix is normal. Diverticular changes are noted in the left colon without evidence of diverticulitis. Vascular/Lymphatic: There is abdominal aortic atherosclerosis without aneurysm. There is no gastrohepatic or hepatoduodenal ligament lymphadenopathy. No intraperitoneal or  retroperitoneal lymphadenopathy. No pelvic sidewall lymphadenopathy. Reproductive: Prostate gland is enlarged. Other: No intraperitoneal free fluid. Musculoskeletal: Small groin hernias contain only fat. Bone windows reveal no worrisome lytic or sclerotic osseous lesions. Review of the MIP images confirms the above findings. IMPRESSION: 1. No CT evidence for acute pulmonary embolus. 2. Progression of interstitial and bandlike airspace disease in the right lung, presumably representing evolution of radiation fibrosis. Disease in the right lower lobe is rather progressive when comparing to 05/23/2017 raising concern for superimposed pneumonia. 3. No acute findings in the abdomen. 4. Nonobstructing renal stones, right greater than left. 5.  Aortic Atherosclerois (ICD10-170.0) Electronically Signed   By: Misty Stanley M.D.   On: 07/13/2017 19:46   Ct Abdomen Pelvis W Contrast  Result Date: 07/13/2017 CLINICAL DATA:  Increasing shortness of breath with worsening weakness. History of lung cancer. Abdominal pain. EXAM: CT ANGIOGRAPHY CHEST CT ABDOMEN AND PELVIS WITH CONTRAST TECHNIQUE: Multidetector CT imaging of the chest was performed using the standard protocol during bolus administration of intravenous contrast. Multiplanar CT image reconstructions and MIPs were obtained to evaluate the vascular anatomy. Multidetector CT imaging of the abdomen and pelvis was performed using the standard protocol during bolus administration of intravenous contrast. CONTRAST:  133mL ISOVUE-370 IOPAMIDOL (ISOVUE-370) INJECTION 76% COMPARISON:  Chest CT 05/23/2017.  Abdomen and pelvis CT 08/28/2016. FINDINGS: CTA CHEST FINDINGS Cardiovascular: Heart size normal. No pericardial effusion. Coronary artery calcification is evident. Atherosclerotic calcification is noted in the wall of the thoracic aorta. No filling defect in the opacified pulmonary arteries to suggest the presence of an acute pulmonary embolus. Mediastinum/Nodes: 8 mm  subcarinal lymph node measured on the prior study is now 11 mm. No left hilar lymphadenopathy. 7 mm short axis right hilar lymph node evident. Mild circumferential wall thickening noted in the esophagus with tiny hiatal hernia evident. Lungs/Pleura: Centrilobular and paraseptal emphysema noted in the upper lobes. Similar bandlike interstitial and airspace opacity in the right lung with interval progression of airspace disease in the right lower lobe. Small right pleural effusion evident. Tiny anterior right lung nodule seen on the previous study has become integrated into the confluent bandlike right lung opacity. Musculoskeletal: Bone windows reveal no worrisome lytic or sclerotic osseous lesions. Review of the MIP images confirms the above findings. CT ABDOMEN and PELVIS FINDINGS Hepatobiliary: No focal abnormality within the liver parenchyma. There  is no evidence for gallstones, gallbladder wall thickening, or pericholecystic fluid. No intrahepatic or extrahepatic biliary dilation. Pancreas: No focal mass lesion. No dilatation of the main duct. No intraparenchymal cyst. No peripancreatic edema. Spleen: No splenomegaly. No focal mass lesion. Adrenals/Urinary Tract: No adrenal nodule or mass. Similar appearance 2.3 x 1.4 cm stone in the right renal pelvis with mild fullness right intrarenal collecting system. 5 mm lower pole stone seen right kidney. 14 mm low-density lesion in the upper pole the right kidney is stable. No right hydroureter. Punctate nonobstructing stones seen lower pole left kidney. No left hydroureter. The urinary bladder appears normal for the degree of distention. Stomach/Bowel: Tiny hiatal hernia. Stomach is nondistended. No gastric wall thickening. No evidence of outlet obstruction. Duodenum is normally positioned as is the ligament of Treitz. No small bowel wall thickening. No small bowel dilatation. The appendix is normal. Diverticular changes are noted in the left colon without evidence of  diverticulitis. Vascular/Lymphatic: There is abdominal aortic atherosclerosis without aneurysm. There is no gastrohepatic or hepatoduodenal ligament lymphadenopathy. No intraperitoneal or retroperitoneal lymphadenopathy. No pelvic sidewall lymphadenopathy. Reproductive: Prostate gland is enlarged. Other: No intraperitoneal free fluid. Musculoskeletal: Small groin hernias contain only fat. Bone windows reveal no worrisome lytic or sclerotic osseous lesions. Review of the MIP images confirms the above findings. IMPRESSION: 1. No CT evidence for acute pulmonary embolus. 2. Progression of interstitial and bandlike airspace disease in the right lung, presumably representing evolution of radiation fibrosis. Disease in the right lower lobe is rather progressive when comparing to 05/23/2017 raising concern for superimposed pneumonia. 3. No acute findings in the abdomen. 4. Nonobstructing renal stones, right greater than left. 5.  Aortic Atherosclerois (ICD10-170.0) Electronically Signed   By: Misty Stanley M.D.   On: 07/13/2017 19:46   Dg Chest Port 1 View  Result Date: 07/13/2017 CLINICAL DATA:  Weakness and cough.  History of lung cancer. EXAM: PORTABLE CHEST 1 VIEW COMPARISON:  CT chest dated May 23, 2017. Chest x-ray dated November 04, 2016. FINDINGS: The heart size and mediastinal contours are within normal limits. Normal pulmonary vascularity. Interstitial and alveolar disease in the right mid lung has slightly increased when compared to prior CT. The left lung is clear. No pleural effusion or pneumothorax. No acute osseous abnormality. IMPRESSION: Interstitial and alveolar opacity in the right mid lung has slightly increased when compared to prior CT. This may reflect further radiation change, although superimposed infection could have a similar appearance. Electronically Signed   By: Titus Dubin M.D.   On: 07/13/2017 14:44    Assessment/Plan Principal Problem:   Pneumonia Active Problems:   DM type 2  (diabetes mellitus, type 2) (HCC)   COPD (chronic obstructive pulmonary disease) (HCC)   PAD (peripheral artery disease) (HCC)   CAD (coronary artery disease)   Malnutrition of moderate degree   Left leg DVT ([place])   Adenocarcinoma of right lung, stage 3 ([place])    1. Right lower lobe pneumonia in the setting of immunocompromise with ongoing chemoradiation treatments with stage III lung cancer.  I will broaden the spectrum of antibiotics currently to vancomycin and Zosyn on account of patient's immunocompromise and even possible concern for aspiration.  Will check respiratory panel, pro-calcitonin, repeat lactic acid levels, urine Legionella, strep pneumonia, and sputum cultures.  Patient does currently have some lactic acid elevation, but does not meet criteria for sepsis at this time.  Continue to monitor closely with aggressive fluid hydration.  He is actually stable for MedSurg floor at  this time.  Blood cultures ordered in ED. 2. Acute hypoxemic respiratory failure secondary to above in the setting of COPD.  Duo nebs as needed.  No need for steroids at this time.  Wean oxygen as tolerated with patient improvement. 3. Type 2 diabetes with hyperglycemia.  Place on sliding scale insulin and monitor.  Will place on carb modified diet and avoid home oral medications for now. Mealtime insulin ordered as well as A1c for compliance evaluation. 4. HTN. Hold lisinopril for now due to soft bps. 5. Left lower extremity DVT-currently off Eliquis.  I will restart this at this time. 6. Hypothyroidism.  Continue Synthroid. 7. Dyslipidemia with CAD/PAD.  Continue statin/Zetia as well as Requip/gabapentin.   DVT prophylaxis: Restart Eliquis Code Status: DNR Family Communication: None at bedside Disposition Plan:Pneumonia treatment ongoing; TBD on DC Consults called:None Admission status: Inpatient, med-surg   [facility] Pager [phone]  If 7PM-7AM, please contact  night-coverage [email or web address] Password [facility]  07/13/2017, 8:38 PM

## 2017-07-13 NOTE — ED Notes (Signed)
Lactic acid 2.0 given to Dr. Jeneen Rinks.

## 2017-07-13 NOTE — Progress Notes (Signed)
Pharmacy Antibiotic Note  Matthew Wilkins is a 72 y.o. male admitted on 07/13/2017 with pneumonia with concern for aspiration.  Pharmacy has been consulted for Zosyn and Vanco dosing.  Plan: Vancomycin 1250 mg IV x 1 dose Vancomycin 500 mg IV every 12 hours.  Goal trough 15-20 mcg/mL. Zosyn 3.375g IV q8h (4 hour infusion).  Monitor labs, cultures, and vanco trough as needed.  Height: 5\' 9"  (175.3 cm) Weight: 147 lb (66.7 kg) IBW/kg (Calculated) : 70.7  Temp (24hrs), Avg:98 F (36.7 C), Min:98 F (36.7 C), Max:98 F (36.7 C)  Recent Labs  Lab 07/13/17 1439 07/13/17 1611 07/13/17 1754  WBC 5.5  --   --   CREATININE 1.09  --   --   LATICACIDVEN  --  1.5 2.1*    Estimated Creatinine Clearance: 56.9 mL/min (by C-G formula based on SCr of 1.09 mg/dL).    Allergies  Allergen Reactions  . Aspirin Other (See Comments)    Nose bleeds     Antimicrobials this admission: Vanco 2/28 >>  Zosyn 2/28 >>  Ceftriaxone 2/28 x 1 dose Azithromycin 2/28 x 1 dose  Dose adjustments this admission: N/A  Microbiology results: 2/28 BCx: pending 2/28 UCx: pending  2/28 Sputum: pending  2/28 MRSA PCR: pending  Thank you for allowing pharmacy to be a part of this patient's care.  Ramond Craver 07/13/2017 10:57 PM

## 2017-07-13 NOTE — Telephone Encounter (Signed)
Patient's daughter called asking if patient can be seen later today.  "I overslept.  Need to get dressed.  Have an hour drive to pick him up, hour drive to [facility]  Schedule for A.P.P and infusion unable to meet this request.  Appointment with A.P.P available tomorrow at 9:30 am and 10:00 am.  Infusion add on needs authorization  Verbal order received and read back from [facility] for patient to expect a call for tomorrow or will be scheduled for next week.  Order given to [facility] at this time.      "I'll take him to ED anyway to be admitted..  I think he has pneumonia.  He missed lat week.  Has refused to go to the ED but wants to come in today.  I'll bring him to [facility] ED by the.[place].  He will not take over the counter medicines.  For the past two weeks he C/O stomach pain.  Unable to eat much.  Getting weaker.  Feels better to lie down but he's up and down often due to pain.  Getting weaker.  Don't know if he has a fever."  Assess his breathing, temperature, bowel status.  Proceed to nearest ED if in distress, fever, pain with breathing, coughing up phlegm.  Call [facility] to notify or update status, otherwise expect a call with next [facility] appointment information.  "I'll pack to spend the night with him in case he needs to come tomorrow."

## 2017-07-13 NOTE — ED Provider Notes (Signed)
[facility] EMERGENCY DEPARTMENT Provider Note   CSN: [id number] Arrival date & time: 07/13/17  1344     History   Chief Complaint Chief Complaint  Patient presents with  . Weakness    HPI Matthew Wilkins is a 72 y.o. male.  Chief complaint is multiple complaints.  HPI:72 years old white male with a stage IIIa non-small cell lung cancer, squamous cell carcinoma.  Follows with Dr. Earlie Server.  Status post chemoradiation with carboplatin and Taxol status post 8 cycles.  Had initial partial response.  Undergoing combination immunotherapy with Imfinzi, last treatment 1/31.  Was scheduled to have course #9 on 2/14.  He arrives here today with his daughter stating that he has been "sick and too weak" since 2 weeks ago to go to his appointment or to see his physician.  He had an appointment this morning for his treatment and to see Dr. Julien Nordmann.  His daughter states that she overslept.  They did not have any appointments for him to be seen so she brought him here.  She states that he has been refusing to go to the doctor since that time and has been wanting to wait until today.  Their primary complaint complaint is "he is just so weak".  Right abdominal pain.  He relates chronic abdominal pain with "hernias" including hiatal hernia and inguinal hernia poor appetite but not vomiting.  He stopped taking his medicines 2 weeks ago.  He states this is because he "just was too weak".  He does feel short of breath.  He was saturating 87%.  Uncertain fever.  No vomiting.  No diarrhea.  His urine is "really dark like tea".     Past Medical History:  Diagnosis Date  . Adenocarcinoma of right lung, stage 3 ([place]) 11/17/2016  . Anxiety   . Arthritis   . Coronary artery disease   . Diabetes mellitus   . DVT (deep venous thrombosis) ([place])   . Encounter for antineoplastic chemotherapy 11/17/2016  . History of radiation therapy 11/28/16-01/06/17   right lung was treated to 60 Gy in 30 fractions  .  Hypercholesterolemia   . Hypertension   . PAD (peripheral artery disease) ([place])   . SOB (shortness of breath)     Patient Active Problem List   Diagnosis Date Noted  . Encounter for antineoplastic immunotherapy 02/23/2017  . Adenocarcinoma of right lung, stage 3 ([place]) 11/17/2016  . Encounter for antineoplastic chemotherapy 11/17/2016  . Goals of care, counseling/discussion 11/17/2016  . Encounter for smoking cessation counseling 11/17/2016  . Left leg DVT ([place]) 08/31/2016  . Malnutrition of moderate degree 08/30/2016  . Thrombocytopenia ([place]) 08/29/2016  . PE (pulmonary thromboembolism) ([place]) 08/28/2016  . Pulmonary nodule 08/28/2016  . Ureteral stone with hydronephrosis 08/28/2016  . Chest pain 08/28/2016  . CAD (coronary artery disease)   . TIA (transient ischemic attack) 11/27/2011  . Research study patient 08/30/2011  . PAD (peripheral artery disease) ([place]) 06/30/2011  . PVD (peripheral vascular disease) ([place]) 01/10/2011  . COPD (chronic obstructive pulmonary disease) ([place]) 07/06/2010  . TOBACCO ABUSE 11/12/2008  . CAD 09/08/2008  . DM type 2 (diabetes mellitus, type 2) ([place]) 08/26/2008  . HYPERCHOLESTEROLEMIA  IIA 08/26/2008  . ANXIETY 08/26/2008  . PARKINSON'S DISEASE 08/26/2008  . Essential hypertension 08/26/2008  . ARTHRITIS 08/26/2008  . SHORTNESS OF BREATH 08/26/2008    Past Surgical History:  Procedure Laterality Date  . CARDIAC CATHETERIZATION N/A 06/10/2015   Procedure: Left Heart Cath and Coronary Angiography;  Surgeon:  Burnell Blanks, MD;  Location: [place] CV LAB;  Service: Cardiovascular;  Laterality: N/A;  . LOWER EXTREMITY ANGIOGRAM N/A 07/13/2011   Procedure: LOWER EXTREMITY ANGIOGRAM;  Surgeon: Burnell Blanks, MD;  Location: [facility] CATH LAB;  Service: Cardiovascular;  Laterality: N/A;  . OTHER SURGICAL HISTORY    . OTHER SURGICAL HISTORY    . OTHER SURGICAL HISTORY    . PERCUTANEOUS STENT INTERVENTION Right 07/13/2011   Procedure:  PERCUTANEOUS STENT INTERVENTION;  Surgeon: Burnell Blanks, MD;  Location: [facility] CATH LAB;  Service: Cardiovascular;  Laterality: Right;       Home Medications    Prior to Admission medications   Medication Sig Start Date End Date Taking? Authorizing Provider  albuterol (PROVENTIL HFA;VENTOLIN HFA) 108 (90 Base) MCG/ACT inhaler Inhale into the lungs. 04/21/17   [provider]  apixaban (ELIQUIS) 5 MG TABS tablet Take 10mg  po bid for 7 days then 5mg  po bid Patient taking differently: 5 mg 2 (two) times daily. Take 10mg  po bid for 7 days then 5mg  po bid 08/31/16   Kathie Dike, MD  Fluticasone-Salmeterol (ADVAIR DISKUS) 250-50 MCG/DOSE AEPB Inhale into the lungs. 04/21/17   [provider]  gabapentin (NEURONTIN) 300 MG capsule Take 1 capsule by mouth 3 (three) times daily. 04/19/16   [provider]  glipiZIDE (GLUCOTROL XL) 5 MG 24 hr tablet Take 10 mg by mouth 2 (two) times daily.  03/11/15   [provider]  levothyroxine (SYNTHROID, LEVOTHROID) 50 MCG tablet Take 1 tablet (50 mcg total) by mouth daily before breakfast. 06/01/17   Curcio, Roselie Awkward, NP  lisinopril (PRINIVIL,ZESTRIL) 5 MG tablet Take 5 mg by mouth daily. 05/21/15   [provider]  metFORMIN (GLUCOPHAGE-XR) 500 MG 24 hr tablet Take 2 tablets by mouth 2 (two) times daily. 04/19/16   [provider]  pantoprazole (PROTONIX) 40 MG tablet Take 40 mg by mouth 2 (two) times daily.  04/22/15   [provider]  prochlorperazine (COMPAZINE) 10 MG tablet Take 1 tablet (10 mg total) by mouth every 6 (six) hours as needed for nausea or vomiting. Patient not taking: Reported on 06/15/2017 11/17/16   Curt Bears, MD  rOPINIRole (REQUIP) 3 MG tablet Take 3 mg by mouth 4 (four) times daily as needed ([place]).  04/10/15   [provider]  rosuvastatin (CRESTOR) 20 MG tablet Take 1 tablet (20 mg total) by mouth daily. 10/07/14   Burnell Blanks, MD    sertraline (ZOLOFT) 25 MG tablet  10/27/16   [provider]  VENTOLIN HFA 108 (90 Base) MCG/ACT inhaler  10/27/16   [provider]  ZETIA 10 MG tablet Take 10 mg by mouth daily. 04/03/15   [provider]    Family History Family History  Family history unknown: Yes    Social History Social History   Tobacco Use  . Smoking status: Current Every Day Smoker    Packs/day: 1.00    Years: 65.00    Pack years: 65.00    Types: Cigarettes  . Smokeless tobacco: Never Used  Substance Use Topics  . Alcohol use: No  . Drug use: No     Allergies   Aspirin   Review of Systems Review of Systems  Constitutional: Positive for appetite change and fatigue. Negative for chills, diaphoresis and fever.  HENT: Negative for mouth sores, sore throat and trouble swallowing.   Eyes: Negative for visual disturbance.  Respiratory: Positive for shortness of breath. Negative for cough,  chest tightness and wheezing.   Cardiovascular: Negative for chest pain.  Gastrointestinal: Positive for abdominal distention. Negative for abdominal pain, diarrhea, nausea and vomiting.  Endocrine: Negative for polydipsia, polyphagia and polyuria.  Genitourinary: Negative for dysuria, frequency and hematuria.  Musculoskeletal: Negative for gait problem.  Skin: Negative for color change, pallor and rash.  Neurological: Positive for weakness. Negative for dizziness, syncope, light-headedness and headaches.  Hematological: Does not bruise/bleed easily.  Psychiatric/Behavioral: Negative for behavioral problems and confusion.     Physical Exam Updated Vital Signs BP (!) 114/50   Pulse 66   Temp 98 F (36.7 C) (Oral)   Resp (!) 21   Ht 5\' 9"  (1.753 m)   Wt 66.7 kg (147 lb)   SpO2 100%   BMI 21.71 kg/m   Physical Exam  Constitutional: He is oriented to person, place, and time. He appears well-developed and well-nourished. No distress.  72 year old male appears chronically ill.   Thin.  Speaking in full sentences.  Does appear somewhat dyspneic with conversation.  HENT:  Head: Normocephalic.  Conjunctive not pale.  No scleral icterus.  Mucous membranes of his mouth are slightly dried.  Not leathery or cracked.  Eyes: Conjunctivae are normal. Pupils are equal, round, and reactive to light. No scleral icterus.  Neck: Normal range of motion. Neck supple. No thyromegaly present.  Cardiovascular: Normal rate and regular rhythm. Exam reveals no gallop and no friction rub.  No murmur heard. Pulmonary/Chest: Effort normal and breath sounds normal. No respiratory distress. He has no wheezes. He has no rales.  Diminished breath sounds.  No focal signs of consolidation, crackles, or prolongation.  Abdominal: Soft. Bowel sounds are normal. He exhibits no distension. There is no tenderness. There is no rebound.  Consistent abdominal exam.  At times will complain of pain stating "I think you got my hernia".  No rigidity.  Musculoskeletal: Normal range of motion.  Neurological: He is alert and oriented to person, place, and time.  Skin: Skin is warm and dry. No rash noted.  Psychiatric: He has a normal mood and affect. His behavior is normal.     ED Treatments / Results  Labs (all labs ordered are listed, but only abnormal results are displayed) Labs Reviewed  CBC - Abnormal; Notable for the following components:      Result Value   Hemoglobin 12.5 (*)    HCT 38.3 (*)    Platelets 413 (*)    All other components within normal limits  URINALYSIS, ROUTINE W REFLEX MICROSCOPIC - Abnormal; Notable for the following components:   Glucose, UA >=500 (*)    All other components within normal limits  COMPREHENSIVE METABOLIC PANEL - Abnormal; Notable for the following components:   Sodium 131 (*)    Potassium 3.4 (*)    Chloride 98 (*)    CO2 20 (*)    Glucose, Bld 296 (*)    Albumin 3.2 (*)    ALT 12 (*)    All other components within normal limits  LACTIC ACID, PLASMA -  Abnormal; Notable for the following components:   Lactic Acid, Venous 2.1 (*)    All other components within normal limits  CBG MONITORING, ED - Abnormal; Notable for the following components:   Glucose-Capillary 329 (*)    All other components within normal limits  CULTURE, BLOOD (ROUTINE X 2)  CULTURE, BLOOD (ROUTINE X 2)  URINE CULTURE  TROPONIN I  LACTIC ACID, PLASMA    EKG  EKG Interpretation None  Radiology Ct Angio Chest Pe W And/or Wo Contrast  Result Date: 07/13/2017 CLINICAL DATA:  Increasing shortness of breath with worsening weakness. History of lung cancer. Abdominal pain. EXAM: CT ANGIOGRAPHY CHEST CT ABDOMEN AND PELVIS WITH CONTRAST TECHNIQUE: Multidetector CT imaging of the chest was performed using the standard protocol during bolus administration of intravenous contrast. Multiplanar CT image reconstructions and MIPs were obtained to evaluate the vascular anatomy. Multidetector CT imaging of the abdomen and pelvis was performed using the standard protocol during bolus administration of intravenous contrast. CONTRAST:  185mL ISOVUE-370 IOPAMIDOL (ISOVUE-370) INJECTION 76% COMPARISON:  Chest CT 05/23/2017.  Abdomen and pelvis CT 08/28/2016. FINDINGS: CTA CHEST FINDINGS Cardiovascular: Heart size normal. No pericardial effusion. Coronary artery calcification is evident. Atherosclerotic calcification is noted in the wall of the thoracic aorta. No filling defect in the opacified pulmonary arteries to suggest the presence of an acute pulmonary embolus. Mediastinum/Nodes: 8 mm subcarinal lymph node measured on the prior study is now 11 mm. No left hilar lymphadenopathy. 7 mm short axis right hilar lymph node evident. Mild circumferential wall thickening noted in the esophagus with tiny hiatal hernia evident. Lungs/Pleura: Centrilobular and paraseptal emphysema noted in the upper lobes. Similar bandlike interstitial and airspace opacity in the right lung with interval progression  of airspace disease in the right lower lobe. Small right pleural effusion evident. Tiny anterior right lung nodule seen on the previous study has become integrated into the confluent bandlike right lung opacity. Musculoskeletal: Bone windows reveal no worrisome lytic or sclerotic osseous lesions. Review of the MIP images confirms the above findings. CT ABDOMEN and PELVIS FINDINGS Hepatobiliary: No focal abnormality within the liver parenchyma. There is no evidence for gallstones, gallbladder wall thickening, or pericholecystic fluid. No intrahepatic or extrahepatic biliary dilation. Pancreas: No focal mass lesion. No dilatation of the main duct. No intraparenchymal cyst. No peripancreatic edema. Spleen: No splenomegaly. No focal mass lesion. Adrenals/Urinary Tract: No adrenal nodule or mass. Similar appearance 2.3 x 1.4 cm stone in the right renal pelvis with mild fullness right intrarenal collecting system. 5 mm lower pole stone seen right kidney. 14 mm low-density lesion in the upper pole the right kidney is stable. No right hydroureter. Punctate nonobstructing stones seen lower pole left kidney. No left hydroureter. The urinary bladder appears normal for the degree of distention. Stomach/Bowel: Tiny hiatal hernia. Stomach is nondistended. No gastric wall thickening. No evidence of outlet obstruction. Duodenum is normally positioned as is the ligament of Treitz. No small bowel wall thickening. No small bowel dilatation. The appendix is normal. Diverticular changes are noted in the left colon without evidence of diverticulitis. Vascular/Lymphatic: There is abdominal aortic atherosclerosis without aneurysm. There is no gastrohepatic or hepatoduodenal ligament lymphadenopathy. No intraperitoneal or retroperitoneal lymphadenopathy. No pelvic sidewall lymphadenopathy. Reproductive: Prostate gland is enlarged. Other: No intraperitoneal free fluid. Musculoskeletal: Small groin hernias contain only fat. Bone windows  reveal no worrisome lytic or sclerotic osseous lesions. Review of the MIP images confirms the above findings. IMPRESSION: 1. No CT evidence for acute pulmonary embolus. 2. Progression of interstitial and bandlike airspace disease in the right lung, presumably representing evolution of radiation fibrosis. Disease in the right lower lobe is rather progressive when comparing to 05/23/2017 raising concern for superimposed pneumonia. 3. No acute findings in the abdomen. 4. Nonobstructing renal stones, right greater than left. 5.  Aortic Atherosclerois (ICD10-170.0) Electronically Signed   By: Misty Stanley M.D.   On: 07/13/2017 19:46   Ct Abdomen Pelvis W Contrast  Result Date:  07/13/2017 CLINICAL DATA:  Increasing shortness of breath with worsening weakness. History of lung cancer. Abdominal pain. EXAM: CT ANGIOGRAPHY CHEST CT ABDOMEN AND PELVIS WITH CONTRAST TECHNIQUE: Multidetector CT imaging of the chest was performed using the standard protocol during bolus administration of intravenous contrast. Multiplanar CT image reconstructions and MIPs were obtained to evaluate the vascular anatomy. Multidetector CT imaging of the abdomen and pelvis was performed using the standard protocol during bolus administration of intravenous contrast. CONTRAST:  1103mL ISOVUE-370 IOPAMIDOL (ISOVUE-370) INJECTION 76% COMPARISON:  Chest CT 05/23/2017.  Abdomen and pelvis CT 08/28/2016. FINDINGS: CTA CHEST FINDINGS Cardiovascular: Heart size normal. No pericardial effusion. Coronary artery calcification is evident. Atherosclerotic calcification is noted in the wall of the thoracic aorta. No filling defect in the opacified pulmonary arteries to suggest the presence of an acute pulmonary embolus. Mediastinum/Nodes: 8 mm subcarinal lymph node measured on the prior study is now 11 mm. No left hilar lymphadenopathy. 7 mm short axis right hilar lymph node evident. Mild circumferential wall thickening noted in the esophagus with tiny hiatal  hernia evident. Lungs/Pleura: Centrilobular and paraseptal emphysema noted in the upper lobes. Similar bandlike interstitial and airspace opacity in the right lung with interval progression of airspace disease in the right lower lobe. Small right pleural effusion evident. Tiny anterior right lung nodule seen on the previous study has become integrated into the confluent bandlike right lung opacity. Musculoskeletal: Bone windows reveal no worrisome lytic or sclerotic osseous lesions. Review of the MIP images confirms the above findings. CT ABDOMEN and PELVIS FINDINGS Hepatobiliary: No focal abnormality within the liver parenchyma. There is no evidence for gallstones, gallbladder wall thickening, or pericholecystic fluid. No intrahepatic or extrahepatic biliary dilation. Pancreas: No focal mass lesion. No dilatation of the main duct. No intraparenchymal cyst. No peripancreatic edema. Spleen: No splenomegaly. No focal mass lesion. Adrenals/Urinary Tract: No adrenal nodule or mass. Similar appearance 2.3 x 1.4 cm stone in the right renal pelvis with mild fullness right intrarenal collecting system. 5 mm lower pole stone seen right kidney. 14 mm low-density lesion in the upper pole the right kidney is stable. No right hydroureter. Punctate nonobstructing stones seen lower pole left kidney. No left hydroureter. The urinary bladder appears normal for the degree of distention. Stomach/Bowel: Tiny hiatal hernia. Stomach is nondistended. No gastric wall thickening. No evidence of outlet obstruction. Duodenum is normally positioned as is the ligament of Treitz. No small bowel wall thickening. No small bowel dilatation. The appendix is normal. Diverticular changes are noted in the left colon without evidence of diverticulitis. Vascular/Lymphatic: There is abdominal aortic atherosclerosis without aneurysm. There is no gastrohepatic or hepatoduodenal ligament lymphadenopathy. No intraperitoneal or retroperitoneal lymphadenopathy.  No pelvic sidewall lymphadenopathy. Reproductive: Prostate gland is enlarged. Other: No intraperitoneal free fluid. Musculoskeletal: Small groin hernias contain only fat. Bone windows reveal no worrisome lytic or sclerotic osseous lesions. Review of the MIP images confirms the above findings. IMPRESSION: 1. No CT evidence for acute pulmonary embolus. 2. Progression of interstitial and bandlike airspace disease in the right lung, presumably representing evolution of radiation fibrosis. Disease in the right lower lobe is rather progressive when comparing to 05/23/2017 raising concern for superimposed pneumonia. 3. No acute findings in the abdomen. 4. Nonobstructing renal stones, right greater than left. 5.  Aortic Atherosclerois (ICD10-170.0) Electronically Signed   By: Misty Stanley M.D.   On: 07/13/2017 19:46   Dg Chest Port 1 View  Result Date: 07/13/2017 CLINICAL DATA:  Weakness and cough.  History of lung cancer.  EXAM: PORTABLE CHEST 1 VIEW COMPARISON:  CT chest dated May 23, 2017. Chest x-ray dated November 04, 2016. FINDINGS: The heart size and mediastinal contours are within normal limits. Normal pulmonary vascularity. Interstitial and alveolar disease in the right mid lung has slightly increased when compared to prior CT. The left lung is clear. No pleural effusion or pneumothorax. No acute osseous abnormality. IMPRESSION: Interstitial and alveolar opacity in the right mid lung has slightly increased when compared to prior CT. This may reflect further radiation change, although superimposed infection could have a similar appearance. Electronically Signed   By: Titus Dubin M.D.   On: 07/13/2017 14:44    Procedures Procedures (including critical care time) : Medications Ordered in ED Medications  sodium chloride 0.9 % bolus 1,000 mL (not administered)  azithromycin (ZITHROMAX) 500 mg in sodium chloride 0.9 % 250 mL IVPB (not administered)  cefTRIAXone (ROCEPHIN) 2 g in sodium chloride 0.9 % 100  mL IVPB (not administered)  sodium chloride 0.9 % bolus 1,000 mL (0 mLs Intravenous Stopped 07/13/17 1601)  0.9 %  sodium chloride infusion ( Intravenous New Bag/Given 07/13/17 1602)  iopamidol (ISOVUE-370) 76 % injection 100 mL (100 mLs Intravenous Contrast Given 07/13/17 1854)     Initial Impression / Assessment and Plan / ED Course  I have reviewed the triage vital signs and the nursing notes.  Pertinent labs & imaging results that were available during my care of the patient were reviewed by me and considered in my medical decision making (see chart for details).   Multiple symptoms and cancer patient status post chemotherapy off Xarelto.  Known lower extremity DVTs.  Plan evaluation is extensive initially including lab, urine, chest x-ray.  Require CT of chest and/or abdomen.  Will need renal function.  Further evaluation initial evaluation.  Reevaluation.  19:53: CT shows no acute findings in the abdomen.  Shows progression of right lower lobe tumor mass.  Shows air bronchograms and new infiltrative disease consistent with pneumonia.  Given IV antibiotics after cultures.  Initial lactate normal.  Second lactate 2.1.  Will give additional fluids.  Has had antibiotics.  Requiring 2 L nasal cannula O2.  Will discuss with hospitalist regarding admission.   Final Clinical Impressions(s) / ED Diagnoses   Final diagnoses:  Community acquired pneumonia of right lower lobe of lung [facility])    ED Discharge Orders    None       Tanna Furry, MD 07/13/17 1954

## 2017-07-13 NOTE — Telephone Encounter (Signed)
Left voicemail for patient regarding appts that were added per 2/28 sch msg.

## 2017-07-13 NOTE — ED Triage Notes (Signed)
Patient reports increasing SOB and weakness over the past 3 months. Patient states he was supposed to go to chemo on Feb 28 and today but missed both appointments. Patient states he is too weak to take his treatments. Patient O2 at 87%. Placed on O2 at 2L via [place].

## 2017-07-14 ENCOUNTER — Ambulatory Visit: Payer: Medicare HMO | Admitting: Oncology

## 2017-07-14 ENCOUNTER — Ambulatory Visit: Payer: Medicare HMO

## 2017-07-14 ENCOUNTER — Other Ambulatory Visit: Payer: Medicare HMO

## 2017-07-14 ENCOUNTER — Other Ambulatory Visit: Payer: Self-pay

## 2017-07-14 DIAGNOSIS — J9601 Acute respiratory failure with hypoxia: Secondary | ICD-10-CM

## 2017-07-14 DIAGNOSIS — E440 Moderate protein-calorie malnutrition: Secondary | ICD-10-CM

## 2017-07-14 DIAGNOSIS — J189 Pneumonia, unspecified organism: Secondary | ICD-10-CM

## 2017-07-14 DIAGNOSIS — C3491 Malignant neoplasm of unspecified part of right bronchus or lung: Secondary | ICD-10-CM

## 2017-07-14 LAB — GLUCOSE, CAPILLARY
GLUCOSE-CAPILLARY: 175 mg/dL — AB (ref 65–99)
Glucose-Capillary: 179 mg/dL — ABNORMAL HIGH (ref 65–99)
Glucose-Capillary: 242 mg/dL — ABNORMAL HIGH (ref 65–99)
Glucose-Capillary: 197 mg/dL — ABNORMAL HIGH (ref 65–99)

## 2017-07-14 LAB — MRSA PCR SCREENING: MRSA BY PCR: NEGATIVE

## 2017-07-14 LAB — BLOOD CULTURE ID PANEL (REFLEXED)
Acinetobacter baumannii: NOT DETECTED
CANDIDA ALBICANS: NOT DETECTED
CANDIDA GLABRATA: NOT DETECTED
CANDIDA KRUSEI: NOT DETECTED
Candida parapsilosis: NOT DETECTED
Candida tropicalis: NOT DETECTED
ENTEROBACTER CLOACAE COMPLEX: NOT DETECTED
ENTEROBACTERIACEAE SPECIES: NOT DETECTED
ESCHERICHIA COLI: NOT DETECTED
Enterococcus species: NOT DETECTED
Haemophilus influenzae: NOT DETECTED
KLEBSIELLA PNEUMONIAE: NOT DETECTED
Klebsiella oxytoca: NOT DETECTED
LISTERIA MONOCYTOGENES: NOT DETECTED
Methicillin resistance: NOT DETECTED
Neisseria meningitidis: NOT DETECTED
Proteus species: NOT DETECTED
Pseudomonas aeruginosa: NOT DETECTED
STREPTOCOCCUS AGALACTIAE: NOT DETECTED
STREPTOCOCCUS PYOGENES: NOT DETECTED
STREPTOCOCCUS SPECIES: NOT DETECTED
Serratia marcescens: NOT DETECTED
Staphylococcus aureus (BCID): NOT DETECTED
Staphylococcus species: DETECTED — AB
Streptococcus pneumoniae: NOT DETECTED

## 2017-07-14 LAB — PROCALCITONIN: Procalcitonin: <0.1 ng/mL

## 2017-07-14 LAB — CBC
HEMATOCRIT: 30.1 % — AB (ref 39.0–52.0)
Hemoglobin: 9.9 g/dL — ABNORMAL LOW (ref 13.0–17.0)
MCH: 27.7 pg (ref 26.0–34.0)
MCHC: 32.9 g/dL (ref 30.0–36.0)
MCV: 84.1 fL (ref 78.0–100.0)
Platelets: 321 10*3/uL (ref 150–400)
RBC: 3.58 MIL/uL — ABNORMAL LOW (ref 4.22–5.81)
RDW: 13.9 % (ref 11.5–15.5)
WBC: 5 10*3/uL (ref 4.0–10.5)

## 2017-07-14 LAB — STREP PNEUMONIAE URINARY ANTIGEN: STREP PNEUMO URINARY ANTIGEN: NEGATIVE

## 2017-07-14 LAB — PROTIME-INR
INR: 1.36
Prothrombin Time: 16.7 seconds — ABNORMAL HIGH (ref 11.4–15.2)

## 2017-07-14 LAB — COMPREHENSIVE METABOLIC PANEL
ALT: 8 U/L — ABNORMAL LOW (ref 17–63)
AST: 14 U/L — ABNORMAL LOW (ref 15–41)
Albumin: 2.2 g/dL — ABNORMAL LOW (ref 3.5–5.0)
Alkaline Phosphatase: 87 U/L (ref 38–126)
Anion gap: 8 (ref 5–15)
BILIRUBIN TOTAL: 0.3 mg/dL (ref 0.3–1.2)
BUN: 8 mg/dL (ref 6–20)
CO2: 20 mmol/L — ABNORMAL LOW (ref 22–32)
CREATININE: 0.89 mg/dL (ref 0.61–1.24)
Calcium: 7.8 mg/dL — ABNORMAL LOW (ref 8.9–10.3)
Chloride: 108 mmol/L (ref 101–111)
GFR calc Af Amer: >60 mL/min (ref >60)
Glucose, Bld: 225 mg/dL — ABNORMAL HIGH (ref 65–99)
POTASSIUM: 4.2 mmol/L (ref 3.5–5.1)
Sodium: 136 mmol/L (ref 135–145)
TOTAL PROTEIN: 5.4 g/dL — AB (ref 6.5–8.1)

## 2017-07-14 LAB — HEMOGLOBIN A1C
Hgb A1c MFr Bld: 9.1 % — ABNORMAL HIGH (ref 4.8–5.6)
Mean Plasma Glucose: 214.47 mg/dL

## 2017-07-14 LAB — LACTIC ACID, PLASMA: Lactic Acid, Venous: 1 mmol/L (ref 0.5–1.9)

## 2017-07-14 LAB — APTT: aPTT: 46 seconds — ABNORMAL HIGH (ref 24–36)

## 2017-07-14 MED ORDER — INSULIN ASPART 100 UNIT/ML ~~LOC~~ SOLN
3.0000 [IU] | Freq: Three times a day (TID) | SUBCUTANEOUS | Status: DC
  Administered 2017-07-14 – 2017-07-15 (×3): 3 [IU] via SUBCUTANEOUS

## 2017-07-14 MED ORDER — ENOXAPARIN SODIUM 40 MG/0.4ML ~~LOC~~ SOLN
40.0000 mg | SUBCUTANEOUS | Status: DC
  Administered 2017-07-14 – 2017-07-16 (×3): 40 mg via SUBCUTANEOUS
  Filled 2017-07-14 (×3): qty 0.4

## 2017-07-14 MED ORDER — ORAL CARE MOUTH RINSE
15.0000 mL | Freq: Two times a day (BID) | OROMUCOSAL | Status: DC
  Administered 2017-07-15 – 2017-07-17 (×3): 15 mL via OROMUCOSAL

## 2017-07-14 MED ORDER — SODIUM CHLORIDE 0.9 % IV SOLN: Freq: Once | INTRAVENOUS | Status: AC

## 2017-07-14 MED ORDER — INSULIN GLARGINE 100 UNIT/ML ~~LOC~~ SOLN
10.0000 [IU] | Freq: Every day | SUBCUTANEOUS | Status: DC
  Administered 2017-07-14: 10 [IU] via SUBCUTANEOUS
  Filled 2017-07-14 (×3): qty 0.1

## 2017-07-14 NOTE — Telephone Encounter (Signed)
FYI "Apologize for calling late, just woke up.  Dad was transferred to ICU at [facility] at 4:30 am.  Has pneumonian in right lung. Same side of cancer.  Dr. At [facility] said he would be reaching out to Dr. Julien Nordmann.  Receiving antibiotics and IVF's.  Could be discharged Monday.  Told him he was coming to receive the chemotherapy was the only way I got him to the ED."  Asked for call with update with discharge and if the March 14 th appointments need to be changed.

## 2017-07-14 NOTE — Progress Notes (Signed)
Inpatient Diabetes Program Recommendations  AACE/ADA: New Consensus Statement on Inpatient Glycemic Control (2015)  Target Ranges:  Prepandial:   less than 140 mg/dL      Peak postprandial:   less than 180 mg/dL (1-2 hours)      Critically ill patients:  140 - 180 mg/dL   Lab Results  Component Value Date   GLUCAP 242 (H) 07/14/2017   HGBA1C 9.1 (H) 07/13/2017    Review of Glycemic Control  Results for Matthew, Wilkins (MRN [id number]) as of 07/14/2017 14:02  Ref. Range 07/13/2017 14:31 07/13/2017 23:05 07/14/2017 07:34 07/14/2017 11:15  Glucose-Capillary Latest Ref Range: 65 - 99 mg/dL 329 (H) 177 (H) 179 (H) 242 (H)    Diabetes history: Type 2 Outpatient Diabetes medications: Glipizide 5mg  qday, Metformin 500mg  bid  Current orders for Inpatient glycemic control: Novolog 0-20 units tid, Novolog 0-5 units qhs,  Novolog 4 units tid  Inpatient Diabetes Program Recommendations:  Based on patient weight, consider decreasing Novolog correction to moderate correction scale 0-15 units tid  Gentry Fitz, RN, [place], [place], CDE Diabetes Coordinator Inpatient Diabetes Program  [phone] (Team Pager) [phone] ([place]) 07/14/2017 2:08 PM

## 2017-07-14 NOTE — Progress Notes (Signed)
PROGRESS NOTE                                                                                                                                                                                                             Patient Demographics:    Matthew Wilkins, is a 72 y.o. male, DOB - 17-Nov-1945, OIZ:[id number]  Admit date - 07/13/2017   Admitting Physician Pratik Darleen Crocker, DO  Outpatient Primary MD for the patient is [name], MD  LOS - 1  Outpatient Specialists: Dr. Julien Nordmann  Chief Complaint  Patient presents with  . Weakness       Brief Narrative   72 year old male with stage IIIa non-small cell carcinoma on chemoradiation, type 2 diabetes mellitus, CAD, COPD, peripheral vascular disease, hypertension, and history of left lower leg DVT currently of anticoagulation past 2-3 weeks brought to the ED by his daughter with generalized weakness for past 2 weeks.  Patient also complaining of shortness of breath and nonproductive cough, poor appetite and some right-sided abdominal pain.  He was unable to keep his appointment with Dr. Julien Nordmann on 2/14 and also his follow-up appointment on the day of admission. In the ED patient was found to be in SIRS with hypoxia, mildly elevated lactic acid, tachypnea and low blood pressure.  CT exam of the chest was negative for PE but with findings of right lung pneumonia.  CT of the abdomen is unremarkable.  Patient given 2 L IV fluid bolus and admitted to stepdown unit for SIRS with healthcare associated pneumonia.    Subjective:   Reports his breathing to be better today but feels very weak.   Assessment  & Plan :    Principal Problem: Systemic inflammatory response syndrome (SIRS) (HCC) Healthcare associated pneumonia ([place]) On empiric vancomycin and Zosyn.  Respiratory viral panel, urine strep and Legionella antigen pending.  Blood culture 1/2 on admission growing gram-positive cocci.   Pending sensitivity. Supportive care with IV fluids, Tylenol and antitussives along with as needed nebs.  Active Problems: Acute respiratory failure with hypoxia (HCC) Secondary to pneumonia.  Does not appear to have COPD exacerbation.  Continue O2 via nasal cannula.  Duo nebs as needed.  Hypotension Secondary to SIRS.  Hold lisinopril and continue IV hydration.  Hypothyroidism Continue Synthroid.  Coronary artery disease/PAD Continue statin.  Continue Neurontin and Requip.  Type 2 diabetes mellitus with hyperglycemia. CBG in 200s-300s.  A1C of 9.1.  Added bedtime Lantus and low-dose pre-meal aspart.  On glipizide and metformin at home which is held.  Hypothyroidism Continue Synthroid.      COPD (chronic obstructive pulmonary disease) (HCC) Not in distress.  PRN nebs.    Malnutrition of moderate degree/failure to thrive Nutrition consult.  PT consult.    Left leg DVT ([place]) Diagnosed in April 2018 and was on anticoagulation until past 3 weeks.  Has successfully completed >9 months of anticoagulation.    Adenocarcinoma of right lung, stage 3 (HCC) Currently on chemoradiation.  Has missed his last 2 appointments.  Will notify Dr. Julien Nordmann.  Anemia of chronic disease Monitor closely.     Code Status : DNR  Family Communication  : Discussed with daughter at bedside  Disposition Plan  : Home once improved, possibly in the next 2-3 days  Barriers For Discharge : Active symptoms  Consults  : None  Procedures  : CT angiogram of the chest  DVT Prophylaxis  :  Lovenox   Lab Results  Component Value Date   PLT 321 07/14/2017    Antibiotics  :    Anti-infectives (From admission, onward)   Start     Dose/Rate Route Frequency Ordered Stop   07/14/17 1200  vancomycin (VANCOCIN) 500 mg in sodium chloride 0.9 % 100 mL IVPB     500 mg 100 mL/hr over 60 Minutes Intravenous Every 12 hours 07/13/17 2256     07/14/17 0600  piperacillin-tazobactam (ZOSYN) IVPB 3.375 g      3.375 g 12.5 mL/hr over 240 Minutes Intravenous Every 8 hours 07/13/17 2141     07/13/17 2200  vancomycin (VANCOCIN) 1,250 mg in sodium chloride 0.9 % 250 mL IVPB     1,250 mg 166.7 mL/hr over 90 Minutes Intravenous  Once 07/13/17 2122 07/14/17 0209   07/13/17 2130  piperacillin-tazobactam (ZOSYN) IVPB 3.375 g     3.375 g 100 mL/hr over 30 Minutes Intravenous  Once 07/13/17 2118 07/14/17 0013   07/13/17 2000  azithromycin (ZITHROMAX) 500 mg in sodium chloride 0.9 % 250 mL IVPB     500 mg 250 mL/hr over 60 Minutes Intravenous  Once 07/13/17 1946 07/13/17 2328   07/13/17 2000  cefTRIAXone (ROCEPHIN) 2 g in sodium chloride 0.9 % 100 mL IVPB     2 g 200 mL/hr over 30 Minutes Intravenous  Once 07/13/17 1946 07/13/17 2039        Objective:   Vitals:   07/14/17 0500 07/14/17 0600 07/14/17 0700 07/14/17 0913  BP:      Pulse: (!) 55 70 (!) 58   Resp: (!) 21 19 16    Temp:   98.5 F (36.9 C)   TempSrc:   Oral   SpO2: 100% 99% 99% 100%  Weight:      Height:        Wt Readings from Last 3 Encounters:  07/14/17 66.7 kg (147 lb 0.8 oz)  06/15/17 66.7 kg (147 lb)  06/01/17 67.1 kg (147 lb 14.4 oz)     Intake/Output Summary (Last 24 hours) at 07/14/2017 1447 Last data filed at 07/14/2017 1425 Gross per 24 hour  Intake 3130 ml  Output 1025 ml  Net 2105 ml     Physical Exam  Gen: not in distress but fatigued HEENT: Pallor present, temporal wasting, moist mucosa, supple neck Chest: Coarse breath sounds over lung bases CVS: N S1&S2, no murmurs, rubs or gallop GI:  soft, NT, ND, BS+ Musculoskeletal: warm, no edema     Data Review:    CBC Recent Labs  Lab 07/13/17 1439 07/14/17 0406  WBC 5.5 5.0  HGB 12.5* 9.9*  HCT 38.3* 30.1*  PLT 413* 321  MCV 84.0 84.1  MCH 27.4 27.7  MCHC 32.6 32.9  RDW 13.9 13.9    Chemistries  Recent Labs  Lab 07/13/17 1439 07/14/17 0406  NA 131* 136  K 3.4* 4.2  CL 98* 108  CO2 20* 20*  GLUCOSE 296* 225*  BUN 12 8  CREATININE 1.09  0.89  CALCIUM 9.0 7.8*  AST 21 14*  ALT 12* 8*  ALKPHOS 110 87  BILITOT 0.6 0.3   ------------------------------------------------------------------------------------------------------------------ No results for input(s): CHOL, HDL, LDLCALC, TRIG, CHOLHDL, LDLDIRECT in the last 72 hours.  Lab Results  Component Value Date   HGBA1C 9.1 (H) 07/13/2017   ------------------------------------------------------------------------------------------------------------------ No results for input(s): TSH, T4TOTAL, T3FREE, THYROIDAB in the last 72 hours.  Invalid input(s): FREET3 ------------------------------------------------------------------------------------------------------------------ No results for input(s): VITAMINB12, FOLATE, FERRITIN, TIBC, IRON, RETICCTPCT in the last 72 hours.  Coagulation profile Recent Labs  Lab 07/14/17 0406  INR 1.36    No results for input(s): DDIMER in the last 72 hours.  Cardiac Enzymes Recent Labs  Lab 07/13/17 1439  TROPONINI <0.03   ------------------------------------------------------------------------------------------------------------------ No results found for: BNP  Inpatient Medications  Scheduled Meds: . apixaban  5 mg Oral BID  . ezetimibe  10 mg Oral Daily  . gabapentin  300 mg Oral TID  . insulin aspart  0-20 Units Subcutaneous TID WC  . insulin aspart  0-5 Units Subcutaneous QHS  . insulin aspart  4 Units Subcutaneous TID WC  . levothyroxine  50 mcg Oral QAC breakfast  . mometasone-formoterol  2 puff Inhalation BID  . pantoprazole  40 mg Oral BID  . rosuvastatin  20 mg Oral q1800  . sertraline  25 mg Oral Daily   Continuous Infusions: . piperacillin-tazobactam (ZOSYN)  IV 3.375 g (07/14/17 1425)  . vancomycin Stopped (07/14/17 1423)   PRN Meds:.acetaminophen **OR** acetaminophen, ipratropium-albuterol, ondansetron **OR** ondansetron (ZOFRAN) IV, rOPINIRole  Micro Results Recent Results (from the past 240 hour(s))    Culture, blood (Routine X 2) w Reflex to ID Panel     Status: None (Preliminary result)   Collection Time: 07/13/17  4:11 PM  Result Value Ref Range Status   Specimen Description BLOOD LEFT HAND  Final   Special Requests   Final    BOTTLES DRAWN AEROBIC AND ANAEROBIC Blood Culture adequate volume   Culture   Final    NO GROWTH < 24 HOURS Performed at [facility], [address], [place], [place] [address]    Report Status PENDING  Incomplete  Culture, blood (Routine X 2) w Reflex to ID Panel     Status: None (Preliminary result)   Collection Time: 07/13/17  4:11 PM  Result Value Ref Range Status   Specimen Description LEFT ANTECUBITAL  Final   Special Requests   Final    BOTTLES DRAWN AEROBIC AND ANAEROBIC Blood Culture adequate volume   Culture   Final    NO GROWTH < 24 HOURS Performed at [facility], [address] [place], [place] [address]    Report Status PENDING  Incomplete  MRSA PCR Screening     Status: None   Collection Time: 07/14/17  4:43 AM  Result Value Ref Range Status   MRSA by PCR NEGATIVE NEGATIVE Final    Comment:  The GeneXpert MRSA Assay (FDA approved for NASAL specimens only), is one component of a comprehensive MRSA colonization surveillance program. It is not intended to diagnose MRSA infection nor to guide or monitor treatment for MRSA infections. Performed at [facility], [address], [place], [place] [address]     Radiology Reports Ct Angio Chest Pe W And/or Wo Contrast  Result Date: 07/13/2017 CLINICAL DATA:  Increasing shortness of breath with worsening weakness. History of lung cancer. Abdominal pain. EXAM: CT ANGIOGRAPHY CHEST CT ABDOMEN AND PELVIS WITH CONTRAST TECHNIQUE: Multidetector CT imaging of the chest was performed using the standard protocol during bolus administration of intravenous contrast. Multiplanar CT image reconstructions and MIPs were obtained to evaluate the vascular anatomy. Multidetector CT imaging of the  abdomen and pelvis was performed using the standard protocol during bolus administration of intravenous contrast. CONTRAST:  135mL ISOVUE-370 IOPAMIDOL (ISOVUE-370) INJECTION 76% COMPARISON:  Chest CT 05/23/2017.  Abdomen and pelvis CT 08/28/2016. FINDINGS: CTA CHEST FINDINGS Cardiovascular: Heart size normal. No pericardial effusion. Coronary artery calcification is evident. Atherosclerotic calcification is noted in the wall of the thoracic aorta. No filling defect in the opacified pulmonary arteries to suggest the presence of an acute pulmonary embolus. Mediastinum/Nodes: 8 mm subcarinal lymph node measured on the prior study is now 11 mm. No left hilar lymphadenopathy. 7 mm short axis right hilar lymph node evident. Mild circumferential wall thickening noted in the esophagus with tiny hiatal hernia evident. Lungs/Pleura: Centrilobular and paraseptal emphysema noted in the upper lobes. Similar bandlike interstitial and airspace opacity in the right lung with interval progression of airspace disease in the right lower lobe. Small right pleural effusion evident. Tiny anterior right lung nodule seen on the previous study has become integrated into the confluent bandlike right lung opacity. Musculoskeletal: Bone windows reveal no worrisome lytic or sclerotic osseous lesions. Review of the MIP images confirms the above findings. CT ABDOMEN and PELVIS FINDINGS Hepatobiliary: No focal abnormality within the liver parenchyma. There is no evidence for gallstones, gallbladder wall thickening, or pericholecystic fluid. No intrahepatic or extrahepatic biliary dilation. Pancreas: No focal mass lesion. No dilatation of the main duct. No intraparenchymal cyst. No peripancreatic edema. Spleen: No splenomegaly. No focal mass lesion. Adrenals/Urinary Tract: No adrenal nodule or mass. Similar appearance 2.3 x 1.4 cm stone in the right renal pelvis with mild fullness right intrarenal collecting system. 5 mm lower pole stone seen  right kidney. 14 mm low-density lesion in the upper pole the right kidney is stable. No right hydroureter. Punctate nonobstructing stones seen lower pole left kidney. No left hydroureter. The urinary bladder appears normal for the degree of distention. Stomach/Bowel: Tiny hiatal hernia. Stomach is nondistended. No gastric wall thickening. No evidence of outlet obstruction. Duodenum is normally positioned as is the ligament of Treitz. No small bowel wall thickening. No small bowel dilatation. The appendix is normal. Diverticular changes are noted in the left colon without evidence of diverticulitis. Vascular/Lymphatic: There is abdominal aortic atherosclerosis without aneurysm. There is no gastrohepatic or hepatoduodenal ligament lymphadenopathy. No intraperitoneal or retroperitoneal lymphadenopathy. No pelvic sidewall lymphadenopathy. Reproductive: Prostate gland is enlarged. Other: No intraperitoneal free fluid. Musculoskeletal: Small groin hernias contain only fat. Bone windows reveal no worrisome lytic or sclerotic osseous lesions. Review of the MIP images confirms the above findings. IMPRESSION: 1. No CT evidence for acute pulmonary embolus. 2. Progression of interstitial and bandlike airspace disease in the right lung, presumably representing evolution of radiation fibrosis. Disease in the right lower lobe is rather progressive  when comparing to 05/23/2017 raising concern for superimposed pneumonia. 3. No acute findings in the abdomen. 4. Nonobstructing renal stones, right greater than left. 5.  Aortic Atherosclerois (ICD10-170.0) Electronically Signed   By: Misty Stanley M.D.   On: 07/13/2017 19:46   Ct Abdomen Pelvis W Contrast  Result Date: 07/13/2017 CLINICAL DATA:  Increasing shortness of breath with worsening weakness. History of lung cancer. Abdominal pain. EXAM: CT ANGIOGRAPHY CHEST CT ABDOMEN AND PELVIS WITH CONTRAST TECHNIQUE: Multidetector CT imaging of the chest was performed using the standard  protocol during bolus administration of intravenous contrast. Multiplanar CT image reconstructions and MIPs were obtained to evaluate the vascular anatomy. Multidetector CT imaging of the abdomen and pelvis was performed using the standard protocol during bolus administration of intravenous contrast. CONTRAST:  150mL ISOVUE-370 IOPAMIDOL (ISOVUE-370) INJECTION 76% COMPARISON:  Chest CT 05/23/2017.  Abdomen and pelvis CT 08/28/2016. FINDINGS: CTA CHEST FINDINGS Cardiovascular: Heart size normal. No pericardial effusion. Coronary artery calcification is evident. Atherosclerotic calcification is noted in the wall of the thoracic aorta. No filling defect in the opacified pulmonary arteries to suggest the presence of an acute pulmonary embolus. Mediastinum/Nodes: 8 mm subcarinal lymph node measured on the prior study is now 11 mm. No left hilar lymphadenopathy. 7 mm short axis right hilar lymph node evident. Mild circumferential wall thickening noted in the esophagus with tiny hiatal hernia evident. Lungs/Pleura: Centrilobular and paraseptal emphysema noted in the upper lobes. Similar bandlike interstitial and airspace opacity in the right lung with interval progression of airspace disease in the right lower lobe. Small right pleural effusion evident. Tiny anterior right lung nodule seen on the previous study has become integrated into the confluent bandlike right lung opacity. Musculoskeletal: Bone windows reveal no worrisome lytic or sclerotic osseous lesions. Review of the MIP images confirms the above findings. CT ABDOMEN and PELVIS FINDINGS Hepatobiliary: No focal abnormality within the liver parenchyma. There is no evidence for gallstones, gallbladder wall thickening, or pericholecystic fluid. No intrahepatic or extrahepatic biliary dilation. Pancreas: No focal mass lesion. No dilatation of the main duct. No intraparenchymal cyst. No peripancreatic edema. Spleen: No splenomegaly. No focal mass lesion.  Adrenals/Urinary Tract: No adrenal nodule or mass. Similar appearance 2.3 x 1.4 cm stone in the right renal pelvis with mild fullness right intrarenal collecting system. 5 mm lower pole stone seen right kidney. 14 mm low-density lesion in the upper pole the right kidney is stable. No right hydroureter. Punctate nonobstructing stones seen lower pole left kidney. No left hydroureter. The urinary bladder appears normal for the degree of distention. Stomach/Bowel: Tiny hiatal hernia. Stomach is nondistended. No gastric wall thickening. No evidence of outlet obstruction. Duodenum is normally positioned as is the ligament of Treitz. No small bowel wall thickening. No small bowel dilatation. The appendix is normal. Diverticular changes are noted in the left colon without evidence of diverticulitis. Vascular/Lymphatic: There is abdominal aortic atherosclerosis without aneurysm. There is no gastrohepatic or hepatoduodenal ligament lymphadenopathy. No intraperitoneal or retroperitoneal lymphadenopathy. No pelvic sidewall lymphadenopathy. Reproductive: Prostate gland is enlarged. Other: No intraperitoneal free fluid. Musculoskeletal: Small groin hernias contain only fat. Bone windows reveal no worrisome lytic or sclerotic osseous lesions. Review of the MIP images confirms the above findings. IMPRESSION: 1. No CT evidence for acute pulmonary embolus. 2. Progression of interstitial and bandlike airspace disease in the right lung, presumably representing evolution of radiation fibrosis. Disease in the right lower lobe is rather progressive when comparing to 05/23/2017 raising concern for superimposed pneumonia. 3. No acute  findings in the abdomen. 4. Nonobstructing renal stones, right greater than left. 5.  Aortic Atherosclerois (ICD10-170.0) Electronically Signed   By: Misty Stanley M.D.   On: 07/13/2017 19:46   Dg Chest Port 1 View  Result Date: 07/13/2017 CLINICAL DATA:  Weakness and cough.  History of lung cancer. EXAM:  PORTABLE CHEST 1 VIEW COMPARISON:  CT chest dated May 23, 2017. Chest x-ray dated November 04, 2016. FINDINGS: The heart size and mediastinal contours are within normal limits. Normal pulmonary vascularity. Interstitial and alveolar disease in the right mid lung has slightly increased when compared to prior CT. The left lung is clear. No pleural effusion or pneumothorax. No acute osseous abnormality. IMPRESSION: Interstitial and alveolar opacity in the right mid lung has slightly increased when compared to prior CT. This may reflect further radiation change, although superimposed infection could have a similar appearance. Electronically Signed   By: Titus Dubin M.D.   On: 07/13/2017 14:44    Time Spent in minutes  35   Ellise Kovack M.D on 07/14/2017 at 2:47 PM  Between 7am to 7pm - Pager - [phone]  After 7pm go to [email or web address] - password [facility]  [facility] -  Office  [phone]

## 2017-07-15 DIAGNOSIS — J181 Lobar pneumonia, unspecified organism: Principal | ICD-10-CM

## 2017-07-15 LAB — CULTURE, BLOOD (ROUTINE X 2): Special Requests: ADEQUATE

## 2017-07-15 LAB — BASIC METABOLIC PANEL
ANION GAP: 9 (ref 5–15)
BUN: 8 mg/dL (ref 6–20)
CHLORIDE: 106 mmol/L (ref 101–111)
CO2: 21 mmol/L — AB (ref 22–32)
Calcium: 8.2 mg/dL — ABNORMAL LOW (ref 8.9–10.3)
Creatinine, Ser: 0.92 mg/dL (ref 0.61–1.24)
GFR calc Af Amer: >60 mL/min (ref >60)
GFR calc non Af Amer: >60 mL/min (ref >60)
GLUCOSE: 212 mg/dL — AB (ref 65–99)
POTASSIUM: 4 mmol/L (ref 3.5–5.1)
Sodium: 136 mmol/L (ref 135–145)

## 2017-07-15 LAB — CBC
HEMATOCRIT: 31.3 % — AB (ref 39.0–52.0)
HEMOGLOBIN: 10.1 g/dL — AB (ref 13.0–17.0)
MCH: 27.4 pg (ref 26.0–34.0)
MCHC: 32.3 g/dL (ref 30.0–36.0)
MCV: 84.8 fL (ref 78.0–100.0)
Platelets: 355 10*3/uL (ref 150–400)
RBC: 3.69 MIL/uL — AB (ref 4.22–5.81)
RDW: 14 % (ref 11.5–15.5)
WBC: 5.5 10*3/uL (ref 4.0–10.5)

## 2017-07-15 LAB — RESPIRATORY PANEL BY PCR
ADENOVIRUS-RVPPCR: NOT DETECTED
Bordetella pertussis: NOT DETECTED
CHLAMYDOPHILA PNEUMONIAE-RVPPCR: NOT DETECTED
Coronavirus 229E: NOT DETECTED
Coronavirus HKU1: NOT DETECTED
Coronavirus NL63: NOT DETECTED
Coronavirus OC43: NOT DETECTED
INFLUENZA A-RVPPCR: NOT DETECTED
INFLUENZA B-RVPPCR: NOT DETECTED
MYCOPLASMA PNEUMONIAE-RVPPCR: NOT DETECTED
Metapneumovirus: NOT DETECTED
PARAINFLUENZA VIRUS 3-RVPPCR: NOT DETECTED
PARAINFLUENZA VIRUS 4-RVPPCR: NOT DETECTED
Parainfluenza Virus 1: NOT DETECTED
Parainfluenza Virus 2: NOT DETECTED
RHINOVIRUS / ENTEROVIRUS - RVPPCR: NOT DETECTED
Respiratory Syncytial Virus: NOT DETECTED

## 2017-07-15 LAB — GLUCOSE, CAPILLARY
GLUCOSE-CAPILLARY: 172 mg/dL — AB (ref 65–99)
GLUCOSE-CAPILLARY: 270 mg/dL — AB (ref 65–99)
GLUCOSE-CAPILLARY: 125 mg/dL — AB (ref 65–99)
Glucose-Capillary: 123 mg/dL — ABNORMAL HIGH (ref 65–99)

## 2017-07-15 LAB — URINE CULTURE

## 2017-07-15 LAB — PROCALCITONIN: Procalcitonin: <0.1 ng/mL

## 2017-07-15 MED ORDER — INSULIN ASPART 100 UNIT/ML ~~LOC~~ SOLN
5.0000 [IU] | Freq: Three times a day (TID) | SUBCUTANEOUS | Status: DC
  Administered 2017-07-15 – 2017-07-17 (×6): 5 [IU] via SUBCUTANEOUS

## 2017-07-15 MED ORDER — GLUCERNA SHAKE PO LIQD
237.0000 mL | Freq: Three times a day (TID) | ORAL | Status: DC
  Administered 2017-07-15 – 2017-07-17 (×5): 237 mL via ORAL

## 2017-07-15 MED ORDER — INSULIN GLARGINE 100 UNIT/ML ~~LOC~~ SOLN
15.0000 [IU] | Freq: Every day | SUBCUTANEOUS | Status: DC
  Administered 2017-07-15: 15 [IU] via SUBCUTANEOUS
  Filled 2017-07-15 (×2): qty 0.15

## 2017-07-15 NOTE — Evaluation (Signed)
Physical Therapy Evaluation Patient Details Name: Matthew Wilkins MRN: [id number] DOB: Dec 11, 1945 Today's Date: 07/15/2017   History of Present Illness  Matthew Wilkins is a 72 y.o. male with medical history significant for stage IIIa non-small cell lung cancer with ongoing chemoradiation, type 2 diabetes, CAD, COPD, peripheral vascular disease, hypertension, and left lower extremity DVT-currently off Eliquis for the last 2-3 weeks.  He was brought to the emergency department by his daughter who states that the patient has been quite ill and weak over the last 2 weeks and has been unable to keep his appointments with Dr. Earlie Server with oncology.  He actually missed his treatment on 2/14 as well as a follow-up appointment today.    Clinical Impression  Patient functioning near baseline for functional mobility and gait other than slightly labored slower than normal cadence without loss of balance, on room air with O2 saturation at 94-95%, slightly SOB and tolerated staying up in chair after therapy.  Patient will benefit from continued physical therapy in hospital and recommended venue below to increase strength, balance, endurance for safe ADLs and gait.    Follow Up Recommendations No PT follow up(patient does not want HHPT, does not want anyone in his house)    Equipment Recommendations  None recommended by PT    Recommendations for Other Services       Precautions / Restrictions Precautions Precautions: Fall Restrictions Weight Bearing Restrictions: No      Mobility  Bed Mobility Overal bed mobility: Modified Independent             General bed mobility comments: head of bed raised  Transfers Overall transfer level: Needs assistance Equipment used: None Transfers: Sit to/from Stand;Stand Pivot Transfers Sit to Stand: Supervision Stand pivot transfers: Supervision          Ambulation/Gait Ambulation/Gait assistance: Supervision Ambulation Distance (Feet): 80  Feet Assistive device: None Gait Pattern/deviations: WFL(Within Functional Limits);Decreased stride length   Gait velocity interpretation: Below normal speed for age/gender General Gait Details: grossly WFL except slower than normal cadence with decreased stride length, no loss of balance, on room air with O2 sats at 94-95%  Stairs            Wheelchair Mobility    Modified Rankin (Stroke Patients Only)       Balance Overall balance assessment: No apparent balance deficits (not formally assessed)                                           Pertinent Vitals/Pain Pain Assessment: No/denies pain    Home Living Family/patient expects to be discharged to:: Private residence Living Arrangements: Alone Available Help at Discharge: Family(daughter) Type of Home: House Home Access: Level entry     Home Layout: One level Home Equipment: Environmental consultant - 4 wheels;Cane - quad;Wheelchair - power;Wheelchair - manual;Shower seat;Bedside commode;Hospital bed;Other (comment)(manual w/c has O2 tank holder, quad-canes x 2)      Prior Function Level of Independence: Independent               Hand Dominance        Extremity/Trunk Assessment   Upper Extremity Assessment Upper Extremity Assessment: Generalized weakness    Lower Extremity Assessment Lower Extremity Assessment: Generalized weakness    Cervical / Trunk Assessment Cervical / Trunk Assessment: Normal  Communication   Communication: No difficulties;HOH(hear better in left ear)  Cognition Arousal/Alertness:  Awake/alert Behavior During Therapy: WFL for tasks assessed/performed Overall Cognitive Status: Within Functional Limits for tasks assessed                                        General Comments      Exercises     Assessment/Plan    PT Assessment Patient needs continued PT services  PT Problem List Decreased strength;Decreased activity tolerance;Decreased  balance;Decreased mobility       PT Treatment Interventions Gait training;Functional mobility training;Therapeutic activities;Therapeutic exercise;Patient/family education    PT Goals (Current goals can be found in the Care Plan section)  Acute Rehab PT Goals Patient Stated Goal: return home PT Goal Formulation: With patient Time For Goal Achievement: 07/22/17 Potential to Achieve Goals: Good    Frequency Min 3X/week   Barriers to discharge        Co-evaluation               AM-PAC PT "6 Clicks" Daily Activity  Outcome Measure Difficulty turning over in bed (including adjusting bedclothes, sheets and blankets)?: None Difficulty moving from lying on back to sitting on the side of the bed? : None Difficulty sitting down on and standing up from a chair with arms (e.g., wheelchair, bedside commode, etc,.)?: None Help needed moving to and from a bed to chair (including a wheelchair)?: None Help needed walking in hospital room?: A Little Help needed climbing 3-5 steps with a railing? : A Little 6 Click Score: 22    End of Session   Activity Tolerance: Patient tolerated treatment well Patient left: in chair;with call bell/phone within reach Nurse Communication: Mobility status PT Visit Diagnosis: Unsteadiness on feet (R26.81);Other abnormalities of gait and mobility (R26.89);Muscle weakness (generalized) (M62.81)    Time: 1610-9604 PT Time Calculation (min) (ACUTE ONLY): 26 min   Charges:   PT Evaluation $PT Eval Moderate Complexity: 1 Mod PT Treatments $Therapeutic Activity: 23-37 mins   PT G Codes:        10:05 AM, 07/24/2017 Lonell Grandchild, MPT Physical Therapist with [facility] [phone] office [phone] mobile phone

## 2017-07-15 NOTE — Plan of Care (Signed)
  Acute Rehab PT Goals(only PT should resolve) Patient Will Transfer Sit To/From Stand 07/15/2017 1007 - Progressing by Lonell Grandchild, PT Flowsheets Taken 07/15/2017 1007  Patient will transfer sit to/from stand Independently Pt Will Transfer Bed To Chair/Chair To Bed 07/15/2017 1007 - Progressing by Lonell Grandchild, PT Flowsheets Taken 07/15/2017 1007  Pt will Transfer Bed to Chair/Chair to Bed Independently Pt Will Ambulate 07/15/2017 1007 - Progressing by Lonell Grandchild, PT Flowsheets Taken 07/15/2017 1007  Pt will Ambulate 100 feet;with modified independence  10:08 AM, 07/15/17 Lonell Grandchild, MPT Physical Therapist with [facility] [phone] office [phone] mobile phone

## 2017-07-15 NOTE — Progress Notes (Signed)
Initial Nutrition Assessment  DOCUMENTATION CODES:  Not applicable  INTERVENTION:  Glucerna Shake po TID, each supplement provides 220 kcal and 10 grams of protein  NUTRITION DIAGNOSIS:  Increased nutrient needs related to catabolic illness (COPD)  & cancer and its related treatments as evidenced by estimated nutritional requirements for these conditions  GOAL:  Patient will meet greater than or equal to 90% of their needs  MONITOR:  PO intake, Supplement acceptance, Labs, Weight trends, I & O's  REASON FOR ASSESSMENT:  Consult Assessment of nutrition requirement/status  ASSESSMENT:  72 y/o male PMHx Anxiety, DM2, CAD, COPD, PVD, HTN, Stage 3 NSCLC on chemoradiation. Presents w/ weakness, cough, dypsnea x 2-3 weeks. Has been unable keep 2 recent follow up appointments w/ oncologist due to illness and missed tx on 2/14. Pt endorses R abdominal pain w/ poor appetite. Worked up for SIRS w/ PNA and admitted for management.   RD consulted for nutritional assessment. RD operating remotely on weekends.   Per chart, daughter reports the patient being unable to eat much for 2 weeks due to stomach pain. Stopped taking his medications due to weakness.   Patient was diagnosed w/ NSCLC last July. At that time, he weighed 148 lbs at that time and appears to have done a excellent job maintaining his weight. He is 147 lbs at this time. He actually even had a brief period of weight gain last fall, gaining up to 156 lbs. On the surface, patient appears to have been able to maintain po intake well.   At this time, patient actually appears to be eating fairly well despite his multiple health issues; has eaten 75-80% of his meals since admit. Will add Glucerna and monitor PO intake.   Physical Exam: Unable to assess  Labs: Bgs: 170-250, Albumin: 2.2 Meds: Insulin, PPI, IVF, IV abx,   Recent Labs  Lab 07/13/17 1439 07/14/17 0406 07/15/17 0417  NA 131* 136 136  K 3.4* 4.2 4.0  CL 98* 108 106   CO2 20* 20* 21*  BUN 12 8 8   CREATININE 1.09 0.89 0.92  CALCIUM 9.0 7.8* 8.2*  GLUCOSE 296* 225* 212*   NUTRITION - FOCUSED PHYSICAL EXAM: Unable to perform  Diet Order:  Diet heart healthy/carb modified Room service appropriate? Yes; Fluid consistency: Thin  EDUCATION NEEDS:  No education needs have been identified at this time  Skin:  Skin Assessment: Reviewed RN Assessment  Last BM:  Unknown  Height:  Ht Readings from Last 1 Encounters:  07/14/17 5\' 9"  (1.753 m)   Weight:  Wt Readings from Last 1 Encounters:  07/14/17 147 lb 0.8 oz (66.7 kg)   Wt Readings from Last 10 Encounters:  07/14/17 147 lb 0.8 oz (66.7 kg)  06/15/17 147 lb (66.7 kg)  06/01/17 147 lb 14.4 oz (67.1 kg)  05/04/17 147 lb 8 oz (66.9 kg)  04/20/17 150 lb 1.6 oz (68.1 kg)  04/05/17 152 lb 8 oz (69.2 kg)  03/23/17 153 lb 11.2 oz (69.7 kg)  03/09/17 155 lb 1.6 oz (70.4 kg)  02/23/17 156 lb 11.2 oz (71.1 kg)  02/07/17 155 lb 6 oz (70.5 kg)   Ideal Body Weight:  72.73 kg  BMI:  Body mass index is 21.72 kg/m.  Estimated Nutritional Needs:  Kcal:  2150-2350 (32-35 kcal/kg bw) Protein:  93-107g Pro (1.4-1.6 g/kg bw) Fluid:  2.1-2.3 L fluid (1 ml/kcal)  Burtis Junes RD, LDN, CNSC Clinical Nutrition Pager: [phone] 07/15/2017 8:48 AM

## 2017-07-15 NOTE — Progress Notes (Signed)
PROGRESS NOTE                                                                                                                                                                                                             Patient Demographics:    Matthew Wilkins, is a 72 y.o. male, DOB - Dec 21, 1945, OZH:[id number]  Admit date - 07/13/2017   Admitting Physician Pratik Darleen Crocker, DO  Outpatient Primary MD for the patient is [name], MD  LOS - 2  Outpatient Specialists: Dr. Julien Nordmann  Chief Complaint  Patient presents with  . Weakness       Brief Narrative   73 year old male with stage IIIa non-small cell carcinoma on chemoradiation, type 2 diabetes mellitus, CAD, COPD, peripheral vascular disease, hypertension, and history of left lower leg DVT currently of anticoagulation past 2-3 weeks brought to the ED by his daughter with generalized weakness for past 2 weeks.  Patient also complaining of shortness of breath and nonproductive cough, poor appetite and some right-sided abdominal pain.  He was unable to keep his appointment with Dr. Julien Nordmann on 2/14 and also his follow-up appointment on the day of admission. In the ED patient was found to be in SIRS with hypoxia, mildly elevated lactic acid, tachypnea and low blood pressure.  CT exam of the chest was negative for PE but with findings of right lung pneumonia.  CT of the abdomen is unremarkable.  Patient given 2 L IV fluid bolus and admitted to stepdown unit for SIRS with healthcare associated pneumonia.    Subjective:   Still has some cough but breathing otherwise better.  Afebrile.   Assessment  & Plan :    Principal Problem: Systemic inflammatory response syndrome (SIRS) (HCC) Healthcare associated pneumonia ([place]) On empiric vancomycin and Zosyn.  (Blood culture growing staph species, possibly contaminant.  We will discontinue vancomycin once final results  available) Respiratory viral panel, urine strep is negative.   Legionella antigen pending.   Supportive care with Tylenol, antitussives and as needed nebs.   Active Problems: Acute respiratory failure with hypoxia (HCC) Secondary to pneumonia.  Does not appear to have COPD exacerbation.  Continue O2 via nasal cannula.  Duo nebs as needed.  Hypotension Secondary to SIRS.  Lisinopril held.  Now better with fluids.  Hypothyroidism Continue Synthroid.  Coronary artery disease/PAD Continue  statin.  Continue Neurontin and Requip.  Type 2 diabetes mellitus with hyperglycemia. CBG in 200s-300s.  A1C of 9.1.  Added and increased bedtime Lantus and pre-meal aspart dose.  He is only on glipizide and metformin at home.  Hypothyroidism Continue Synthroid.      COPD (chronic obstructive pulmonary disease) (HCC) Not in distress.  PRN nebs.    Malnutrition of moderate degree/failure to thrive Nutrition consult appreciated.  Seen by PT this morning and patient refused home health.    Left leg DVT ([place]) Diagnosed in April 2018 and was on anticoagulation until past 3 weeks.  Has successfully completed >9 months of anticoagulation.    Adenocarcinoma of right lung, stage 3 (HCC) Currently on chemoradiation.  Has missed his last 2 appointments.  I have sent a message to his oncologist.  Anemia of chronic disease Monitor closely.     Code Status : DNR  Family Communication  : Discussed with daughter at bedside  Disposition Plan  : Home once improved, possibly in the next 2-3 days   Barriers For Discharge : Active symptoms  Consults  : None  Procedures  : CT angiogram of the chest  DVT Prophylaxis  :  Lovenox   Lab Results  Component Value Date   PLT 355 07/15/2017    Antibiotics  :    Anti-infectives (From admission, onward)   Start     Dose/Rate Route Frequency Ordered Stop   07/14/17 1200  vancomycin (VANCOCIN) 500 mg in sodium chloride 0.9 % 100 mL IVPB     500 mg 100  mL/hr over 60 Minutes Intravenous Every 12 hours 07/13/17 2256     07/14/17 0600  piperacillin-tazobactam (ZOSYN) IVPB 3.375 g     3.375 g 12.5 mL/hr over 240 Minutes Intravenous Every 8 hours 07/13/17 2141     07/13/17 2200  vancomycin (VANCOCIN) 1,250 mg in sodium chloride 0.9 % 250 mL IVPB     1,250 mg 166.7 mL/hr over 90 Minutes Intravenous  Once 07/13/17 2122 07/14/17 0209   07/13/17 2130  piperacillin-tazobactam (ZOSYN) IVPB 3.375 g     3.375 g 100 mL/hr over 30 Minutes Intravenous  Once 07/13/17 2118 07/14/17 0013   07/13/17 2000  azithromycin (ZITHROMAX) 500 mg in sodium chloride 0.9 % 250 mL IVPB     500 mg 250 mL/hr over 60 Minutes Intravenous  Once 07/13/17 1946 07/13/17 2328   07/13/17 2000  cefTRIAXone (ROCEPHIN) 2 g in sodium chloride 0.9 % 100 mL IVPB     2 g 200 mL/hr over 30 Minutes Intravenous  Once 07/13/17 1946 07/13/17 2039        Objective:   Vitals:   07/14/17 2000 07/14/17 2125 07/14/17 2158 07/15/17 0902  BP:  (!) 110/58    Pulse:  74    Resp:  (!) 31    Temp: 97.6 F (36.4 C) 99.4 F (37.4 C)    TempSrc: Oral Oral    SpO2:  94% 94% 95%  Weight:      Height:        Wt Readings from Last 3 Encounters:  07/14/17 66.7 kg (147 lb 0.8 oz)  06/15/17 66.7 kg (147 lb)  06/01/17 67.1 kg (147 lb 14.4 oz)     Intake/Output Summary (Last 24 hours) at 07/15/2017 1211 Last data filed at 07/15/2017 1478 Gross per 24 hour  Intake 590 ml  Output 2000 ml  Net -1410 ml     Physical Exam General: Elderly male, fatigued, not in distress  HEENT: Mucosa, supple neck Chest: Improved breath sounds bilaterally CVS: Normal S1 and S2, no murmurs GI: Soft, nondistended, nontender Musculoskeletal: Warm, no edema     Data Review:    CBC Recent Labs  Lab 07/13/17 1439 07/14/17 0406 07/15/17 0417  WBC 5.5 5.0 5.5  HGB 12.5* 9.9* 10.1*  HCT 38.3* 30.1* 31.3*  PLT 413* 321 355  MCV 84.0 84.1 84.8  MCH 27.4 27.7 27.4  MCHC 32.6 32.9 32.3  RDW 13.9 13.9  14.0    Chemistries  Recent Labs  Lab 07/13/17 1439 07/14/17 0406 07/15/17 0417  NA 131* 136 136  K 3.4* 4.2 4.0  CL 98* 108 106  CO2 20* 20* 21*  GLUCOSE 296* 225* 212*  BUN 12 8 8   CREATININE 1.09 0.89 0.92  CALCIUM 9.0 7.8* 8.2*  AST 21 14*  --   ALT 12* 8*  --   ALKPHOS 110 87  --   BILITOT 0.6 0.3  --    ------------------------------------------------------------------------------------------------------------------ No results for input(s): CHOL, HDL, LDLCALC, TRIG, CHOLHDL, LDLDIRECT in the last 72 hours.  Lab Results  Component Value Date   HGBA1C 9.1 (H) 07/13/2017   ------------------------------------------------------------------------------------------------------------------ No results for input(s): TSH, T4TOTAL, T3FREE, THYROIDAB in the last 72 hours.  Invalid input(s): FREET3 ------------------------------------------------------------------------------------------------------------------ No results for input(s): VITAMINB12, FOLATE, FERRITIN, TIBC, IRON, RETICCTPCT in the last 72 hours.  Coagulation profile Recent Labs  Lab 07/14/17 0406  INR 1.36    No results for input(s): DDIMER in the last 72 hours.  Cardiac Enzymes Recent Labs  Lab 07/13/17 1439  TROPONINI <0.03   ------------------------------------------------------------------------------------------------------------------ No results found for: BNP  Inpatient Medications  Scheduled Meds: . enoxaparin (LOVENOX) injection  40 mg Subcutaneous Q24H  . ezetimibe  10 mg Oral Daily  . feeding supplement (GLUCERNA SHAKE)  237 mL Oral TID BM  . gabapentin  300 mg Oral TID  . insulin aspart  0-20 Units Subcutaneous TID WC  . insulin aspart  0-5 Units Subcutaneous QHS  . insulin aspart  3 Units Subcutaneous TID WC  . insulin glargine  10 Units Subcutaneous QHS  . levothyroxine  50 mcg Oral QAC breakfast  . mouth rinse  15 mL Mouth Rinse BID  . mometasone-formoterol  2 puff Inhalation  BID  . pantoprazole  40 mg Oral BID  . rosuvastatin  20 mg Oral q1800  . sertraline  25 mg Oral Daily   Continuous Infusions: . piperacillin-tazobactam (ZOSYN)  IV Stopped (07/15/17 0908)  . vancomycin Stopped (07/15/17 0325)   PRN Meds:.acetaminophen **OR** acetaminophen, ipratropium-albuterol, ondansetron **OR** ondansetron (ZOFRAN) IV, rOPINIRole  Micro Results Recent Results (from the past 240 hour(s))  Culture, blood (Routine X 2) w Reflex to ID Panel     Status: None (Preliminary result)   Collection Time: 07/13/17  4:11 PM  Result Value Ref Range Status   Specimen Description BLOOD LEFT HAND  Final   Special Requests   Final    BOTTLES DRAWN AEROBIC AND ANAEROBIC Blood Culture adequate volume   Culture   Final    NO GROWTH 2 DAYS Performed at [facility], [address], [place], [place] [address]    Report Status PENDING  Incomplete  Culture, blood (Routine X 2) w Reflex to ID Panel     Status: Abnormal   Collection Time: 07/13/17  4:11 PM  Result Value Ref Range Status   Specimen Description   Final    LEFT ANTECUBITAL Performed at [facility], [address], [place], [place]  [address]    Special Requests   Final    BOTTLES DRAWN AEROBIC AND ANAEROBIC Blood Culture adequate volume Performed at [facility], [address] [place], [place] [address]    Culture  Setup Time   Final    GRAM POSITIVE COCCI Gram Stain Report Called to,Read Back By and Verified With: HYLTON,L. AT 7001 ON 07/14/2017 BY EVA AEROBIC BOTTLE ONLY Performed at [facility], READ BACK BY AND VERIFIED WITH: L HYLTON RN 1906 07/14/17 A BROWNING    Culture (A)  Final    STAPHYLOCOCCUS SPECIES (COAGULASE NEGATIVE) THE SIGNIFICANCE OF ISOLATING THIS ORGANISM FROM A SINGLE SET OF BLOOD CULTURES WHEN MULTIPLE SETS ARE DRAWN IS UNCERTAIN. PLEASE NOTIFY THE MICROBIOLOGY DEPARTMENT WITHIN ONE WEEK IF SPECIATION AND SENSITIVITIES ARE REQUIRED. Performed at [facility], [place] [address] [place], [place] [address]    Report Status 07/15/2017 FINAL  Final  Blood Culture ID Panel (Reflexed)     Status: Abnormal   Collection Time: 07/13/17  4:11 PM  Result Value Ref Range Status   Enterococcus species NOT DETECTED NOT DETECTED Final   Listeria monocytogenes NOT DETECTED NOT DETECTED Final   Staphylococcus species DETECTED (A) NOT DETECTED Final    Comment: Methicillin (oxacillin) susceptible coagulase negative staphylococcus. Possible blood culture contaminant (unless isolated from more than one blood culture draw or clinical case suggests pathogenicity). No antibiotic treatment is indicated for blood  culture contaminants. CRITICAL RESULT CALLED TO, READ BACK BY AND VERIFIED WITH: L HYLTON RN 1906 07/14/17 A BROWNING    Staphylococcus aureus NOT DETECTED NOT DETECTED Final   Methicillin resistance NOT DETECTED NOT DETECTED Final   Streptococcus species NOT DETECTED NOT DETECTED Final   Streptococcus agalactiae NOT DETECTED NOT DETECTED Final   Streptococcus pneumoniae NOT DETECTED NOT DETECTED Final   Streptococcus pyogenes NOT DETECTED NOT DETECTED Final   Acinetobacter baumannii NOT DETECTED NOT DETECTED Final   Enterobacteriaceae species NOT DETECTED NOT DETECTED Final   Enterobacter cloacae complex NOT DETECTED NOT DETECTED Final   Escherichia coli NOT DETECTED NOT DETECTED Final   Klebsiella oxytoca NOT DETECTED NOT DETECTED Final   Klebsiella pneumoniae NOT DETECTED NOT DETECTED Final   Proteus species NOT DETECTED NOT DETECTED Final   Serratia marcescens NOT DETECTED NOT DETECTED Final   Haemophilus influenzae NOT DETECTED NOT DETECTED Final   Neisseria meningitidis NOT DETECTED NOT DETECTED Final   Pseudomonas aeruginosa NOT DETECTED NOT DETECTED Final   Candida albicans NOT DETECTED NOT DETECTED Final   Candida glabrata NOT DETECTED NOT DETECTED Final   Candida krusei NOT DETECTED NOT DETECTED Final   Candida parapsilosis NOT DETECTED  NOT DETECTED Final   Candida tropicalis NOT DETECTED NOT DETECTED Final  Urine Culture     Status: Abnormal   Collection Time: 07/13/17  6:20 PM  Result Value Ref Range Status   Specimen Description   Final    URINE, CLEAN CATCH Performed at [facility], [address] [place], [place] [address]    Special Requests   Final    NONE Performed at [facility], [address], [place], [place] [address]    Culture MULTIPLE SPECIES PRESENT, SUGGEST RECOLLECTION (A)  Final   Report Status 07/15/2017 FINAL  Final  MRSA PCR Screening     Status: None   Collection Time: 07/14/17  4:43 AM  Result Value Ref Range Status   MRSA by PCR NEGATIVE NEGATIVE Final    Comment:  The GeneXpert MRSA Assay (FDA approved for NASAL specimens only), is one component of a comprehensive MRSA colonization surveillance program. It is not intended to diagnose MRSA infection nor to guide or monitor treatment for MRSA infections. Performed at [facility], [address], [place], [place] [address]   Respiratory Panel by PCR     Status: None   Collection Time: 07/14/17  5:00 AM  Result Value Ref Range Status   Adenovirus NOT DETECTED NOT DETECTED Final   Coronavirus 229E NOT DETECTED NOT DETECTED Final   Coronavirus HKU1 NOT DETECTED NOT DETECTED Final   Coronavirus NL63 NOT DETECTED NOT DETECTED Final   Coronavirus OC43 NOT DETECTED NOT DETECTED Final   Metapneumovirus NOT DETECTED NOT DETECTED Final   Rhinovirus / Enterovirus NOT DETECTED NOT DETECTED Final   Influenza A NOT DETECTED NOT DETECTED Final   Influenza B NOT DETECTED NOT DETECTED Final   Parainfluenza Virus 1 NOT DETECTED NOT DETECTED Final   Parainfluenza Virus 2 NOT DETECTED NOT DETECTED Final   Parainfluenza Virus 3 NOT DETECTED NOT DETECTED Final   Parainfluenza Virus 4 NOT DETECTED NOT DETECTED Final   Respiratory Syncytial Virus NOT DETECTED NOT DETECTED Final   Bordetella pertussis NOT DETECTED NOT DETECTED Final    Chlamydophila pneumoniae NOT DETECTED NOT DETECTED Final   Mycoplasma pneumoniae NOT DETECTED NOT DETECTED Final    Radiology Reports Ct Angio Chest Pe W And/or Wo Contrast  Result Date: 07/13/2017 CLINICAL DATA:  Increasing shortness of breath with worsening weakness. History of lung cancer. Abdominal pain. EXAM: CT ANGIOGRAPHY CHEST CT ABDOMEN AND PELVIS WITH CONTRAST TECHNIQUE: Multidetector CT imaging of the chest was performed using the standard protocol during bolus administration of intravenous contrast. Multiplanar CT image reconstructions and MIPs were obtained to evaluate the vascular anatomy. Multidetector CT imaging of the abdomen and pelvis was performed using the standard protocol during bolus administration of intravenous contrast. CONTRAST:  156mL ISOVUE-370 IOPAMIDOL (ISOVUE-370) INJECTION 76% COMPARISON:  Chest CT 05/23/2017.  Abdomen and pelvis CT 08/28/2016. FINDINGS: CTA CHEST FINDINGS Cardiovascular: Heart size normal. No pericardial effusion. Coronary artery calcification is evident. Atherosclerotic calcification is noted in the wall of the thoracic aorta. No filling defect in the opacified pulmonary arteries to suggest the presence of an acute pulmonary embolus. Mediastinum/Nodes: 8 mm subcarinal lymph node measured on the prior study is now 11 mm. No left hilar lymphadenopathy. 7 mm short axis right hilar lymph node evident. Mild circumferential wall thickening noted in the esophagus with tiny hiatal hernia evident. Lungs/Pleura: Centrilobular and paraseptal emphysema noted in the upper lobes. Similar bandlike interstitial and airspace opacity in the right lung with interval progression of airspace disease in the right lower lobe. Small right pleural effusion evident. Tiny anterior right lung nodule seen on the previous study has become integrated into the confluent bandlike right lung opacity. Musculoskeletal: Bone windows reveal no worrisome lytic or sclerotic osseous lesions.  Review of the MIP images confirms the above findings. CT ABDOMEN and PELVIS FINDINGS Hepatobiliary: No focal abnormality within the liver parenchyma. There is no evidence for gallstones, gallbladder wall thickening, or pericholecystic fluid. No intrahepatic or extrahepatic biliary dilation. Pancreas: No focal mass lesion. No dilatation of the main duct. No intraparenchymal cyst. No peripancreatic edema. Spleen: No splenomegaly. No focal mass lesion. Adrenals/Urinary Tract: No adrenal nodule or mass. Similar appearance 2.3 x 1.4 cm stone in the right renal pelvis with mild fullness right intrarenal collecting system. 5 mm lower pole stone seen right kidney. 14 mm low-density  lesion in the upper pole the right kidney is stable. No right hydroureter. Punctate nonobstructing stones seen lower pole left kidney. No left hydroureter. The urinary bladder appears normal for the degree of distention. Stomach/Bowel: Tiny hiatal hernia. Stomach is nondistended. No gastric wall thickening. No evidence of outlet obstruction. Duodenum is normally positioned as is the ligament of Treitz. No small bowel wall thickening. No small bowel dilatation. The appendix is normal. Diverticular changes are noted in the left colon without evidence of diverticulitis. Vascular/Lymphatic: There is abdominal aortic atherosclerosis without aneurysm. There is no gastrohepatic or hepatoduodenal ligament lymphadenopathy. No intraperitoneal or retroperitoneal lymphadenopathy. No pelvic sidewall lymphadenopathy. Reproductive: Prostate gland is enlarged. Other: No intraperitoneal free fluid. Musculoskeletal: Small groin hernias contain only fat. Bone windows reveal no worrisome lytic or sclerotic osseous lesions. Review of the MIP images confirms the above findings. IMPRESSION: 1. No CT evidence for acute pulmonary embolus. 2. Progression of interstitial and bandlike airspace disease in the right lung, presumably representing evolution of radiation  fibrosis. Disease in the right lower lobe is rather progressive when comparing to 05/23/2017 raising concern for superimposed pneumonia. 3. No acute findings in the abdomen. 4. Nonobstructing renal stones, right greater than left. 5.  Aortic Atherosclerois (ICD10-170.0) Electronically Signed   By: Misty Stanley M.D.   On: 07/13/2017 19:46   Ct Abdomen Pelvis W Contrast  Result Date: 07/13/2017 CLINICAL DATA:  Increasing shortness of breath with worsening weakness. History of lung cancer. Abdominal pain. EXAM: CT ANGIOGRAPHY CHEST CT ABDOMEN AND PELVIS WITH CONTRAST TECHNIQUE: Multidetector CT imaging of the chest was performed using the standard protocol during bolus administration of intravenous contrast. Multiplanar CT image reconstructions and MIPs were obtained to evaluate the vascular anatomy. Multidetector CT imaging of the abdomen and pelvis was performed using the standard protocol during bolus administration of intravenous contrast. CONTRAST:  112mL ISOVUE-370 IOPAMIDOL (ISOVUE-370) INJECTION 76% COMPARISON:  Chest CT 05/23/2017.  Abdomen and pelvis CT 08/28/2016. FINDINGS: CTA CHEST FINDINGS Cardiovascular: Heart size normal. No pericardial effusion. Coronary artery calcification is evident. Atherosclerotic calcification is noted in the wall of the thoracic aorta. No filling defect in the opacified pulmonary arteries to suggest the presence of an acute pulmonary embolus. Mediastinum/Nodes: 8 mm subcarinal lymph node measured on the prior study is now 11 mm. No left hilar lymphadenopathy. 7 mm short axis right hilar lymph node evident. Mild circumferential wall thickening noted in the esophagus with tiny hiatal hernia evident. Lungs/Pleura: Centrilobular and paraseptal emphysema noted in the upper lobes. Similar bandlike interstitial and airspace opacity in the right lung with interval progression of airspace disease in the right lower lobe. Small right pleural effusion evident. Tiny anterior right lung  nodule seen on the previous study has become integrated into the confluent bandlike right lung opacity. Musculoskeletal: Bone windows reveal no worrisome lytic or sclerotic osseous lesions. Review of the MIP images confirms the above findings. CT ABDOMEN and PELVIS FINDINGS Hepatobiliary: No focal abnormality within the liver parenchyma. There is no evidence for gallstones, gallbladder wall thickening, or pericholecystic fluid. No intrahepatic or extrahepatic biliary dilation. Pancreas: No focal mass lesion. No dilatation of the main duct. No intraparenchymal cyst. No peripancreatic edema. Spleen: No splenomegaly. No focal mass lesion. Adrenals/Urinary Tract: No adrenal nodule or mass. Similar appearance 2.3 x 1.4 cm stone in the right renal pelvis with mild fullness right intrarenal collecting system. 5 mm lower pole stone seen right kidney. 14 mm low-density lesion in the upper pole the right kidney is stable. No right  hydroureter. Punctate nonobstructing stones seen lower pole left kidney. No left hydroureter. The urinary bladder appears normal for the degree of distention. Stomach/Bowel: Tiny hiatal hernia. Stomach is nondistended. No gastric wall thickening. No evidence of outlet obstruction. Duodenum is normally positioned as is the ligament of Treitz. No small bowel wall thickening. No small bowel dilatation. The appendix is normal. Diverticular changes are noted in the left colon without evidence of diverticulitis. Vascular/Lymphatic: There is abdominal aortic atherosclerosis without aneurysm. There is no gastrohepatic or hepatoduodenal ligament lymphadenopathy. No intraperitoneal or retroperitoneal lymphadenopathy. No pelvic sidewall lymphadenopathy. Reproductive: Prostate gland is enlarged. Other: No intraperitoneal free fluid. Musculoskeletal: Small groin hernias contain only fat. Bone windows reveal no worrisome lytic or sclerotic osseous lesions. Review of the MIP images confirms the above findings.  IMPRESSION: 1. No CT evidence for acute pulmonary embolus. 2. Progression of interstitial and bandlike airspace disease in the right lung, presumably representing evolution of radiation fibrosis. Disease in the right lower lobe is rather progressive when comparing to 05/23/2017 raising concern for superimposed pneumonia. 3. No acute findings in the abdomen. 4. Nonobstructing renal stones, right greater than left. 5.  Aortic Atherosclerois (ICD10-170.0) Electronically Signed   By: Misty Stanley M.D.   On: 07/13/2017 19:46   Dg Chest Port 1 View  Result Date: 07/13/2017 CLINICAL DATA:  Weakness and cough.  History of lung cancer. EXAM: PORTABLE CHEST 1 VIEW COMPARISON:  CT chest dated May 23, 2017. Chest x-ray dated November 04, 2016. FINDINGS: The heart size and mediastinal contours are within normal limits. Normal pulmonary vascularity. Interstitial and alveolar disease in the right mid lung has slightly increased when compared to prior CT. The left lung is clear. No pleural effusion or pneumothorax. No acute osseous abnormality. IMPRESSION: Interstitial and alveolar opacity in the right mid lung has slightly increased when compared to prior CT. This may reflect further radiation change, although superimposed infection could have a similar appearance. Electronically Signed   By: Titus Dubin M.D.   On: 07/13/2017 14:44    Time Spent in minutes  25   Alfreida Steffenhagen M.D on 07/15/2017 at 12:11 PM  Between 7am to 7pm - Pager - [phone]  After 7pm go to [email or web address] - password [facility]  [facility] -  Office  [phone]

## 2017-07-16 LAB — GLUCOSE, CAPILLARY
GLUCOSE-CAPILLARY: 176 mg/dL — AB (ref 65–99)
GLUCOSE-CAPILLARY: 143 mg/dL — AB (ref 65–99)
Glucose-Capillary: 206 mg/dL — ABNORMAL HIGH (ref 65–99)
Glucose-Capillary: 162 mg/dL — ABNORMAL HIGH (ref 65–99)

## 2017-07-16 MED ORDER — INSULIN GLARGINE 100 UNIT/ML ~~LOC~~ SOLN
20.0000 [IU] | Freq: Every day | SUBCUTANEOUS | Status: DC
  Administered 2017-07-16: 20 [IU] via SUBCUTANEOUS
  Filled 2017-07-16 (×2): qty 0.2

## 2017-07-16 MED ORDER — LEVOFLOXACIN IN D5W 750 MG/150ML IV SOLN
750.0000 mg | INTRAVENOUS | Status: DC
Start: 2017-07-16 — End: 2017-07-17
  Administered 2017-07-16: 750 mg via INTRAVENOUS
  Filled 2017-07-16: qty 150

## 2017-07-16 NOTE — Progress Notes (Signed)
Physical Therapy Treatment Patient Details Name: Matthew Wilkins MRN: [id number] DOB: 02-12-1946 Today's Date: 07/16/2017    History of Present Illness Matthew Wilkins is a 72 y.o. male with medical history significant for stage IIIa non-small cell lung cancer with ongoing chemoradiation, type 2 diabetes, CAD, COPD, peripheral vascular disease, hypertension, and left lower extremity DVT-currently off Eliquis for the last 2-3 weeks.  He was brought to the emergency department by his daughter who states that the patient has been quite ill and weak over the last 2 weeks and has been unable to keep his appointments with Dr. Earlie Server with oncology.  He actually missed his treatment on 2/14 as well as a follow-up appointment today.    PT Comments    Patient tends to taking short steps when walking to avoid losing balance which is baseline per patient.   Patient ambulated in hallway without loss of balance, on room air with O2 sats between 90-92%, 92% when returning to room, increased to 95% while seated at bedside, instructed in pursed lip breathing during gait training with fair/good carryover.  Patient left on room air per RRT request and tolerated sitting up in chair to eat his breakfast - RN notified.  Patient will benefit from continued physical therapy in hospital and recommended venue below to increase strength, balance, endurance for safe ADLs and gait.   Follow Up Recommendations  Home health PT(patient declines HHPT due not wanting anyone in his house)     Equipment Recommendations  None recommended by PT    Recommendations for Other Services       Precautions / Restrictions Precautions Precautions: Fall Restrictions Weight Bearing Restrictions: No    Mobility  Bed Mobility Overal bed mobility: Modified Independent             General bed mobility comments: head of bed raised  Transfers Overall transfer level: Needs assistance Equipment used: None Transfers: Sit to/from  Stand;Stand Pivot Transfers Sit to Stand: Supervision Stand pivot transfers: Supervision          Ambulation/Gait Ambulation/Gait assistance: Supervision Ambulation Distance (Feet): 100 Feet Assistive device: None Gait Pattern/deviations: Decreased step length - left;Decreased step length - right;Decreased stride length   Gait velocity interpretation: at or above normal speed for age/gender General Gait Details: demonstrates slightly labored cadence with decreased stride length and limited armswing, patient states he usually takes shorter steps when walking due to fear of losing balance   Stairs            Wheelchair Mobility    Modified Rankin (Stroke Patients Only)       Balance Overall balance assessment: No apparent balance deficits (not formally assessed)                                          Cognition Arousal/Alertness: Awake/alert Behavior During Therapy: WFL for tasks assessed/performed Overall Cognitive Status: Within Functional Limits for tasks assessed                                        Exercises      General Comments        Pertinent Vitals/Pain Pain Assessment: No/denies pain    Home Living  Prior Function            PT Goals (current goals can now be found in the care plan section) Acute Rehab PT Goals Patient Stated Goal: return home PT Goal Formulation: With patient Time For Goal Achievement: 07/22/17 Potential to Achieve Goals: Good    Frequency    Min 3X/week      PT Plan Current plan remains appropriate    Co-evaluation              AM-PAC PT "6 Clicks" Daily Activity  Outcome Measure  Difficulty turning over in bed (including adjusting bedclothes, sheets and blankets)?: None Difficulty moving from lying on back to sitting on the side of the bed? : None Difficulty sitting down on and standing up from a chair with arms (e.g., wheelchair,  bedside commode, etc,.)?: None Help needed moving to and from a bed to chair (including a wheelchair)?: None Help needed walking in hospital room?: None Help needed climbing 3-5 steps with a railing? : A Little 6 Click Score: 23    End of Session   Activity Tolerance: Patient tolerated treatment well(limited secondary to mild SOB) Patient left: in chair;with call bell/phone within reach Nurse Communication: Mobility status PT Visit Diagnosis: Unsteadiness on feet (R26.81);Other abnormalities of gait and mobility (R26.89);Muscle weakness (generalized) (M62.81)     Time: 0981-1914 PT Time Calculation (min) (ACUTE ONLY): 26 min  Charges:  $Therapeutic Activity: 23-37 mins                    G Codes:       8:49 AM, 08/10/17 Lonell Grandchild, MPT Physical Therapist with [facility] [phone] office [phone] mobile phone

## 2017-07-16 NOTE — Progress Notes (Addendum)
Pharmacy Antibiotic Note  Matthew Wilkins is a 72 y.o. male admitted on 07/13/2017 with pneumonia with concern for aspiration.  Pharmacy has been consulted for Zosyn and Vanco dosing. MRSA pcr is negative.  MD may dc vanc once blood cultures finalized  Plan: Vancomycin 500 mg IV every 12 hours.  Goal trough 15-20 mcg/mL. Zosyn 3.375g IV q8h (4 hour infusion).  Monitor labs, cultures, and vanco trough as needed.  Height: 5\' 9"  (175.3 cm) Weight: 147 lb 6.4 oz (66.9 kg) IBW/kg (Calculated) : 70.7  Temp (24hrs), Avg:98.7 F (37.1 C), Min:98.6 F (37 C), Max:98.8 F (37.1 C)  Recent Labs  Lab 07/13/17 1439 07/13/17 1611 07/13/17 1754 07/14/17 0406 07/15/17 0417  WBC 5.5  --   --  5.0 5.5  CREATININE 1.09  --   --  0.89 0.92  LATICACIDVEN  --  1.5 2.1* 1.0  --     Estimated Creatinine Clearance: 67.7 mL/min (by C-G formula based on SCr of 0.92 mg/dL).    Allergies  Allergen Reactions  . Aspirin Other (See Comments)    Nose bleeds     Antimicrobials this admission: Vanco 2/28 >>  Zosyn 2/28 >>  Ceftriaxone 2/28 x 1 dose Azithromycin 2/28 x 1 dose  Dose adjustments this admission: N/A  Microbiology results: 2/28 BCx: 1/2 CNS 2/28 UCx: multiple species 2/28 Sputum: pending  2/28 MRSA PCR: (-)  Thank you for allowing pharmacy to be a part of this patient's care.  [name] 07/16/2017 9:15 AM  Addum:  Change to levaquin 750 mg IV q24 hours

## 2017-07-16 NOTE — Progress Notes (Addendum)
PROGRESS NOTE                                                                                                                                                                                                             Patient Demographics:    Matthew Wilkins, is a 72 y.o. male, DOB - Sep 21, 1945, BDZ:[id number]  Admit date - 07/13/2017   Admitting Physician Pratik Darleen Crocker, DO  Outpatient Primary MD for the patient is [name], MD  LOS - 3  Outpatient Specialists: Dr. Julien Nordmann  Chief Complaint  Patient presents with  . Weakness       Brief Narrative   72 year old male with stage IIIa non-small cell carcinoma on chemoradiation, type 2 diabetes mellitus, CAD, COPD, peripheral vascular disease, hypertension, and history of left lower leg DVT currently of anticoagulation past 2-3 weeks brought to the ED by his daughter with generalized weakness for past 2 weeks.  Patient also complaining of shortness of breath and nonproductive cough, poor appetite and some right-sided abdominal pain.  He was unable to keep his appointment with Dr. Julien Nordmann on 2/14 and also his follow-up appointment on the day of admission. In the ED patient was found to be in SIRS with hypoxia, mildly elevated lactic acid, tachypnea and low blood pressure.  CT exam of the chest was negative for PE but with findings of right lung pneumonia.  CT of the abdomen is unremarkable.  Patient given 2 L IV fluid bolus and admitted to stepdown unit for SIRS with healthcare associated pneumonia.    Subjective:   Reports some shortness of breath, cough improved.  No overnight events.   Assessment  & Plan :    Principal Problem: Systemic inflammatory response syndrome (SIRS) ([place]) Healthcare associated pneumonia ([place]) Blood culture growing coag negative staph which is contaminant.  Narrow antibiotic to Levaquin.  Continue O2 via nasal cannula and  antitussives.   Active Problems: Acute respiratory failure with hypoxia (HCC) Secondary to pneumonia.  Does not appear to have COPD exacerbation.  Continue O2 via nasal cannula.  Duo nebs as needed.  Hypotension Secondary to SIRS.  Lisinopril held.  Now better with fluids.  Hypothyroidism Continue Synthroid.  Coronary artery disease/PAD Continue statin.  Continue Neurontin and Requip.  Type 2 diabetes mellitus with hyperglycemia. CBG in 200s-300s.  A1C of 9.1.  Added  and increased bedtime Lantus and pre-meal aspart dose.  He is only on glipizide and metformin at home.  Hypothyroidism Continue Synthroid.      COPD (chronic obstructive pulmonary disease) (HCC) Not in distress.  PRN nebs.    Malnutrition of moderate degree/failure to thrive Nutrition consult appreciated.  Seen by PT this morning and patient refused home health.    Left leg DVT ([place]) Diagnosed in April 2018 and was on anticoagulation until past 3 weeks.  Has successfully completed >9 months of anticoagulation.    Adenocarcinoma of right lung, stage 3 (HCC) Currently on chemoradiation.  Has missed his last 2 appointments.  I have sent a message to his oncologist.  Anemia of chronic disease Monitor closely.     Code Status : DNR  Family Communication  : Discussed with daughter at bedside  Disposition Plan  : Home possibly tomorrow if improved.  Refused home health.  Barriers For Discharge : Active symptoms  Consults  : None  Procedures  : CT angiogram of the chest  DVT Prophylaxis  :  Lovenox   Lab Results  Component Value Date   PLT 355 07/15/2017    Antibiotics  :    Anti-infectives (From admission, onward)   Start     Dose/Rate Route Frequency Ordered Stop   07/14/17 1200  vancomycin (VANCOCIN) 500 mg in sodium chloride 0.9 % 100 mL IVPB     500 mg 100 mL/hr over 60 Minutes Intravenous Every 12 hours 07/13/17 2256     07/14/17 0600  piperacillin-tazobactam (ZOSYN) IVPB 3.375 g     3.375  g 12.5 mL/hr over 240 Minutes Intravenous Every 8 hours 07/13/17 2141     07/13/17 2200  vancomycin (VANCOCIN) 1,250 mg in sodium chloride 0.9 % 250 mL IVPB     1,250 mg 166.7 mL/hr over 90 Minutes Intravenous  Once 07/13/17 2122 07/14/17 0209   07/13/17 2130  piperacillin-tazobactam (ZOSYN) IVPB 3.375 g     3.375 g 100 mL/hr over 30 Minutes Intravenous  Once 07/13/17 2118 07/14/17 0013   07/13/17 2000  azithromycin (ZITHROMAX) 500 mg in sodium chloride 0.9 % 250 mL IVPB     500 mg 250 mL/hr over 60 Minutes Intravenous  Once 07/13/17 1946 07/13/17 2328   07/13/17 2000  cefTRIAXone (ROCEPHIN) 2 g in sodium chloride 0.9 % 100 mL IVPB     2 g 200 mL/hr over 30 Minutes Intravenous  Once 07/13/17 1946 07/13/17 2039        Objective:   Vitals:   07/15/17 1300 07/15/17 2022 07/16/17 0549 07/16/17 0842  BP: 110/60 (!) 121/55 (!) 110/54   Pulse: 78 80 73   Resp: 19 20 20    Temp: 98.8 F (37.1 C) 98.7 F (37.1 C) 98.6 F (37 C)   TempSrc:  Oral Oral   SpO2: 97% 95% 95% 95%  Weight:   66.9 kg (147 lb 6.4 oz)   Height:        Wt Readings from Last 3 Encounters:  07/16/17 66.9 kg (147 lb 6.4 oz)  06/15/17 66.7 kg (147 lb)  06/01/17 67.1 kg (147 lb 14.4 oz)     Intake/Output Summary (Last 24 hours) at 07/16/2017 1319 Last data filed at 07/16/2017 0500 Gross per 24 hour  Intake -  Output 900 ml  Net -900 ml     Physical Exam General: Elderly male, appears fatigued HEENT: Moist mucosa, supple neck Chest: Clear to auscultation bilaterally CVS: Normal S1-S2, no murmurs GI: Soft, nondistended,  nontender Musculoskeletal: Warm, no edema     Data Review:    CBC Recent Labs  Lab 07/13/17 1439 07/14/17 0406 07/15/17 0417  WBC 5.5 5.0 5.5  HGB 12.5* 9.9* 10.1*  HCT 38.3* 30.1* 31.3*  PLT 413* 321 355  MCV 84.0 84.1 84.8  MCH 27.4 27.7 27.4  MCHC 32.6 32.9 32.3  RDW 13.9 13.9 14.0    Chemistries  Recent Labs  Lab 07/13/17 1439 07/14/17 0406 07/15/17 0417  NA  131* 136 136  K 3.4* 4.2 4.0  CL 98* 108 106  CO2 20* 20* 21*  GLUCOSE 296* 225* 212*  BUN 12 8 8   CREATININE 1.09 0.89 0.92  CALCIUM 9.0 7.8* 8.2*  AST 21 14*  --   ALT 12* 8*  --   ALKPHOS 110 87  --   BILITOT 0.6 0.3  --    ------------------------------------------------------------------------------------------------------------------ No results for input(s): CHOL, HDL, LDLCALC, TRIG, CHOLHDL, LDLDIRECT in the last 72 hours.  Lab Results  Component Value Date   HGBA1C 9.1 (H) 07/13/2017   ------------------------------------------------------------------------------------------------------------------ No results for input(s): TSH, T4TOTAL, T3FREE, THYROIDAB in the last 72 hours.  Invalid input(s): FREET3 ------------------------------------------------------------------------------------------------------------------ No results for input(s): VITAMINB12, FOLATE, FERRITIN, TIBC, IRON, RETICCTPCT in the last 72 hours.  Coagulation profile Recent Labs  Lab 07/14/17 0406  INR 1.36    No results for input(s): DDIMER in the last 72 hours.  Cardiac Enzymes Recent Labs  Lab 07/13/17 1439  TROPONINI <0.03   ------------------------------------------------------------------------------------------------------------------ No results found for: BNP  Inpatient Medications  Scheduled Meds: . enoxaparin (LOVENOX) injection  40 mg Subcutaneous Q24H  . ezetimibe  10 mg Oral Daily  . feeding supplement (GLUCERNA SHAKE)  237 mL Oral TID BM  . gabapentin  300 mg Oral TID  . insulin aspart  0-20 Units Subcutaneous TID WC  . insulin aspart  0-5 Units Subcutaneous QHS  . insulin aspart  5 Units Subcutaneous TID WC  . insulin glargine  15 Units Subcutaneous QHS  . levothyroxine  50 mcg Oral QAC breakfast  . mouth rinse  15 mL Mouth Rinse BID  . mometasone-formoterol  2 puff Inhalation BID  . pantoprazole  40 mg Oral BID  . rosuvastatin  20 mg Oral q1800  . sertraline  25 mg  Oral Daily   Continuous Infusions: . piperacillin-tazobactam (ZOSYN)  IV Stopped (07/16/17 1020)  . vancomycin 500 mg (07/16/17 1158)   PRN Meds:.acetaminophen **OR** acetaminophen, ipratropium-albuterol, ondansetron **OR** ondansetron (ZOFRAN) IV, rOPINIRole  Micro Results Recent Results (from the past 240 hour(s))  Culture, blood (Routine X 2) w Reflex to ID Panel     Status: None (Preliminary result)   Collection Time: 07/13/17  4:11 PM  Result Value Ref Range Status   Specimen Description BLOOD LEFT HAND  Final   Special Requests   Final    BOTTLES DRAWN AEROBIC AND ANAEROBIC Blood Culture adequate volume   Culture   Final    NO GROWTH 3 DAYS Performed at [facility], [address], [place], [place] [address]    Report Status PENDING  Incomplete  Culture, blood (Routine X 2) w Reflex to ID Panel     Status: Abnormal   Collection Time: 07/13/17  4:11 PM  Result Value Ref Range Status   Specimen Description   Final    LEFT ANTECUBITAL Performed at [facility], [address] [place], [place] [address]    Special Requests   Final    BOTTLES DRAWN AEROBIC AND ANAEROBIC Blood  Culture adequate volume Performed at [facility], [address], [place], [place] [address]    Culture  Setup Time   Final    GRAM POSITIVE COCCI Gram Stain Report Called to,Read Back By and Verified With: HYLTON,L. AT 9563 ON 07/14/2017 BY EVA AEROBIC BOTTLE ONLY Performed at [facility], READ BACK BY AND VERIFIED WITH: L HYLTON RN 1906 07/14/17 A BROWNING    Culture (A)  Final    STAPHYLOCOCCUS SPECIES (COAGULASE NEGATIVE) THE SIGNIFICANCE OF ISOLATING THIS ORGANISM FROM A SINGLE SET OF BLOOD CULTURES WHEN MULTIPLE SETS ARE DRAWN IS UNCERTAIN. PLEASE NOTIFY THE MICROBIOLOGY DEPARTMENT WITHIN ONE WEEK IF SPECIATION AND SENSITIVITIES ARE REQUIRED. Performed at [facility], [place] [address] [place], [place] [address]    Report Status 07/15/2017 FINAL  Final  Blood  Culture ID Panel (Reflexed)     Status: Abnormal   Collection Time: 07/13/17  4:11 PM  Result Value Ref Range Status   Enterococcus species NOT DETECTED NOT DETECTED Final   Listeria monocytogenes NOT DETECTED NOT DETECTED Final   Staphylococcus species DETECTED (A) NOT DETECTED Final    Comment: Methicillin (oxacillin) susceptible coagulase negative staphylococcus. Possible blood culture contaminant (unless isolated from more than one blood culture draw or clinical case suggests pathogenicity). No antibiotic treatment is indicated for blood  culture contaminants. CRITICAL RESULT CALLED TO, READ BACK BY AND VERIFIED WITH: L HYLTON RN 1906 07/14/17 A BROWNING    Staphylococcus aureus NOT DETECTED NOT DETECTED Final   Methicillin resistance NOT DETECTED NOT DETECTED Final   Streptococcus species NOT DETECTED NOT DETECTED Final   Streptococcus agalactiae NOT DETECTED NOT DETECTED Final   Streptococcus pneumoniae NOT DETECTED NOT DETECTED Final   Streptococcus pyogenes NOT DETECTED NOT DETECTED Final   Acinetobacter baumannii NOT DETECTED NOT DETECTED Final   Enterobacteriaceae species NOT DETECTED NOT DETECTED Final   Enterobacter cloacae complex NOT DETECTED NOT DETECTED Final   Escherichia coli NOT DETECTED NOT DETECTED Final   Klebsiella oxytoca NOT DETECTED NOT DETECTED Final   Klebsiella pneumoniae NOT DETECTED NOT DETECTED Final   Proteus species NOT DETECTED NOT DETECTED Final   Serratia marcescens NOT DETECTED NOT DETECTED Final   Haemophilus influenzae NOT DETECTED NOT DETECTED Final   Neisseria meningitidis NOT DETECTED NOT DETECTED Final   Pseudomonas aeruginosa NOT DETECTED NOT DETECTED Final   Candida albicans NOT DETECTED NOT DETECTED Final   Candida glabrata NOT DETECTED NOT DETECTED Final   Candida krusei NOT DETECTED NOT DETECTED Final   Candida parapsilosis NOT DETECTED NOT DETECTED Final   Candida tropicalis NOT DETECTED NOT DETECTED Final  Urine Culture     Status:  Abnormal   Collection Time: 07/13/17  6:20 PM  Result Value Ref Range Status   Specimen Description   Final    URINE, CLEAN CATCH Performed at [name], [address], [place], [place] [address]    Special Requests   Final    NONE Performed at [facility], [address] [place], [place] [address]    Culture MULTIPLE SPECIES PRESENT, SUGGEST RECOLLECTION (A)  Final   Report Status 07/15/2017 FINAL  Final  MRSA PCR Screening     Status: None   Collection Time: 07/14/17  4:43 AM  Result Value Ref Range Status   MRSA by PCR NEGATIVE NEGATIVE Final    Comment:        The GeneXpert MRSA Assay (FDA approved for NASAL specimens only), is one component of a  comprehensive MRSA colonization surveillance program. It is not intended to diagnose MRSA infection nor to guide or monitor treatment for MRSA infections. Performed at [facility], [address], [place], [place] [address]   Respiratory Panel by PCR     Status: None   Collection Time: 07/14/17  5:00 AM  Result Value Ref Range Status   Adenovirus NOT DETECTED NOT DETECTED Final   Coronavirus 229E NOT DETECTED NOT DETECTED Final   Coronavirus HKU1 NOT DETECTED NOT DETECTED Final   Coronavirus NL63 NOT DETECTED NOT DETECTED Final   Coronavirus OC43 NOT DETECTED NOT DETECTED Final   Metapneumovirus NOT DETECTED NOT DETECTED Final   Rhinovirus / Enterovirus NOT DETECTED NOT DETECTED Final   Influenza A NOT DETECTED NOT DETECTED Final   Influenza B NOT DETECTED NOT DETECTED Final   Parainfluenza Virus 1 NOT DETECTED NOT DETECTED Final   Parainfluenza Virus 2 NOT DETECTED NOT DETECTED Final   Parainfluenza Virus 3 NOT DETECTED NOT DETECTED Final   Parainfluenza Virus 4 NOT DETECTED NOT DETECTED Final   Respiratory Syncytial Virus NOT DETECTED NOT DETECTED Final   Bordetella pertussis NOT DETECTED NOT DETECTED Final   Chlamydophila pneumoniae NOT DETECTED NOT DETECTED Final   Mycoplasma pneumoniae NOT DETECTED NOT DETECTED  Final    Radiology Reports Ct Angio Chest Pe W And/or Wo Contrast  Result Date: 07/13/2017 CLINICAL DATA:  Increasing shortness of breath with worsening weakness. History of lung cancer. Abdominal pain. EXAM: CT ANGIOGRAPHY CHEST CT ABDOMEN AND PELVIS WITH CONTRAST TECHNIQUE: Multidetector CT imaging of the chest was performed using the standard protocol during bolus administration of intravenous contrast. Multiplanar CT image reconstructions and MIPs were obtained to evaluate the vascular anatomy. Multidetector CT imaging of the abdomen and pelvis was performed using the standard protocol during bolus administration of intravenous contrast. CONTRAST:  1107mL ISOVUE-370 IOPAMIDOL (ISOVUE-370) INJECTION 76% COMPARISON:  Chest CT 05/23/2017.  Abdomen and pelvis CT 08/28/2016. FINDINGS: CTA CHEST FINDINGS Cardiovascular: Heart size normal. No pericardial effusion. Coronary artery calcification is evident. Atherosclerotic calcification is noted in the wall of the thoracic aorta. No filling defect in the opacified pulmonary arteries to suggest the presence of an acute pulmonary embolus. Mediastinum/Nodes: 8 mm subcarinal lymph node measured on the prior study is now 11 mm. No left hilar lymphadenopathy. 7 mm short axis right hilar lymph node evident. Mild circumferential wall thickening noted in the esophagus with tiny hiatal hernia evident. Lungs/Pleura: Centrilobular and paraseptal emphysema noted in the upper lobes. Similar bandlike interstitial and airspace opacity in the right lung with interval progression of airspace disease in the right lower lobe. Small right pleural effusion evident. Tiny anterior right lung nodule seen on the previous study has become integrated into the confluent bandlike right lung opacity. Musculoskeletal: Bone windows reveal no worrisome lytic or sclerotic osseous lesions. Review of the MIP images confirms the above findings. CT ABDOMEN and PELVIS FINDINGS Hepatobiliary: No focal  abnormality within the liver parenchyma. There is no evidence for gallstones, gallbladder wall thickening, or pericholecystic fluid. No intrahepatic or extrahepatic biliary dilation. Pancreas: No focal mass lesion. No dilatation of the main duct. No intraparenchymal cyst. No peripancreatic edema. Spleen: No splenomegaly. No focal mass lesion. Adrenals/Urinary Tract: No adrenal nodule or mass. Similar appearance 2.3 x 1.4 cm stone in the right renal pelvis with mild fullness right intrarenal collecting system. 5 mm lower pole stone seen right kidney. 14 mm low-density lesion in the upper pole the right kidney is stable. No right hydroureter. Punctate nonobstructing  stones seen lower pole left kidney. No left hydroureter. The urinary bladder appears normal for the degree of distention. Stomach/Bowel: Tiny hiatal hernia. Stomach is nondistended. No gastric wall thickening. No evidence of outlet obstruction. Duodenum is normally positioned as is the ligament of Treitz. No small bowel wall thickening. No small bowel dilatation. The appendix is normal. Diverticular changes are noted in the left colon without evidence of diverticulitis. Vascular/Lymphatic: There is abdominal aortic atherosclerosis without aneurysm. There is no gastrohepatic or hepatoduodenal ligament lymphadenopathy. No intraperitoneal or retroperitoneal lymphadenopathy. No pelvic sidewall lymphadenopathy. Reproductive: Prostate gland is enlarged. Other: No intraperitoneal free fluid. Musculoskeletal: Small groin hernias contain only fat. Bone windows reveal no worrisome lytic or sclerotic osseous lesions. Review of the MIP images confirms the above findings. IMPRESSION: 1. No CT evidence for acute pulmonary embolus. 2. Progression of interstitial and bandlike airspace disease in the right lung, presumably representing evolution of radiation fibrosis. Disease in the right lower lobe is rather progressive when comparing to 05/23/2017 raising concern for  superimposed pneumonia. 3. No acute findings in the abdomen. 4. Nonobstructing renal stones, right greater than left. 5.  Aortic Atherosclerois (ICD10-170.0) Electronically Signed   By: Misty Stanley M.D.   On: 07/13/2017 19:46   Ct Abdomen Pelvis W Contrast  Result Date: 07/13/2017 CLINICAL DATA:  Increasing shortness of breath with worsening weakness. History of lung cancer. Abdominal pain. EXAM: CT ANGIOGRAPHY CHEST CT ABDOMEN AND PELVIS WITH CONTRAST TECHNIQUE: Multidetector CT imaging of the chest was performed using the standard protocol during bolus administration of intravenous contrast. Multiplanar CT image reconstructions and MIPs were obtained to evaluate the vascular anatomy. Multidetector CT imaging of the abdomen and pelvis was performed using the standard protocol during bolus administration of intravenous contrast. CONTRAST:  166mL ISOVUE-370 IOPAMIDOL (ISOVUE-370) INJECTION 76% COMPARISON:  Chest CT 05/23/2017.  Abdomen and pelvis CT 08/28/2016. FINDINGS: CTA CHEST FINDINGS Cardiovascular: Heart size normal. No pericardial effusion. Coronary artery calcification is evident. Atherosclerotic calcification is noted in the wall of the thoracic aorta. No filling defect in the opacified pulmonary arteries to suggest the presence of an acute pulmonary embolus. Mediastinum/Nodes: 8 mm subcarinal lymph node measured on the prior study is now 11 mm. No left hilar lymphadenopathy. 7 mm short axis right hilar lymph node evident. Mild circumferential wall thickening noted in the esophagus with tiny hiatal hernia evident. Lungs/Pleura: Centrilobular and paraseptal emphysema noted in the upper lobes. Similar bandlike interstitial and airspace opacity in the right lung with interval progression of airspace disease in the right lower lobe. Small right pleural effusion evident. Tiny anterior right lung nodule seen on the previous study has become integrated into the confluent bandlike right lung opacity.  Musculoskeletal: Bone windows reveal no worrisome lytic or sclerotic osseous lesions. Review of the MIP images confirms the above findings. CT ABDOMEN and PELVIS FINDINGS Hepatobiliary: No focal abnormality within the liver parenchyma. There is no evidence for gallstones, gallbladder wall thickening, or pericholecystic fluid. No intrahepatic or extrahepatic biliary dilation. Pancreas: No focal mass lesion. No dilatation of the main duct. No intraparenchymal cyst. No peripancreatic edema. Spleen: No splenomegaly. No focal mass lesion. Adrenals/Urinary Tract: No adrenal nodule or mass. Similar appearance 2.3 x 1.4 cm stone in the right renal pelvis with mild fullness right intrarenal collecting system. 5 mm lower pole stone seen right kidney. 14 mm low-density lesion in the upper pole the right kidney is stable. No right hydroureter. Punctate nonobstructing stones seen lower pole left kidney. No left hydroureter. The urinary bladder  appears normal for the degree of distention. Stomach/Bowel: Tiny hiatal hernia. Stomach is nondistended. No gastric wall thickening. No evidence of outlet obstruction. Duodenum is normally positioned as is the ligament of Treitz. No small bowel wall thickening. No small bowel dilatation. The appendix is normal. Diverticular changes are noted in the left colon without evidence of diverticulitis. Vascular/Lymphatic: There is abdominal aortic atherosclerosis without aneurysm. There is no gastrohepatic or hepatoduodenal ligament lymphadenopathy. No intraperitoneal or retroperitoneal lymphadenopathy. No pelvic sidewall lymphadenopathy. Reproductive: Prostate gland is enlarged. Other: No intraperitoneal free fluid. Musculoskeletal: Small groin hernias contain only fat. Bone windows reveal no worrisome lytic or sclerotic osseous lesions. Review of the MIP images confirms the above findings. IMPRESSION: 1. No CT evidence for acute pulmonary embolus. 2. Progression of interstitial and bandlike  airspace disease in the right lung, presumably representing evolution of radiation fibrosis. Disease in the right lower lobe is rather progressive when comparing to 05/23/2017 raising concern for superimposed pneumonia. 3. No acute findings in the abdomen. 4. Nonobstructing renal stones, right greater than left. 5.  Aortic Atherosclerois (ICD10-170.0) Electronically Signed   By: Misty Stanley M.D.   On: 07/13/2017 19:46   Dg Chest Port 1 View  Result Date: 07/13/2017 CLINICAL DATA:  Weakness and cough.  History of lung cancer. EXAM: PORTABLE CHEST 1 VIEW COMPARISON:  CT chest dated May 23, 2017. Chest x-ray dated November 04, 2016. FINDINGS: The heart size and mediastinal contours are within normal limits. Normal pulmonary vascularity. Interstitial and alveolar disease in the right mid lung has slightly increased when compared to prior CT. The left lung is clear. No pleural effusion or pneumothorax. No acute osseous abnormality. IMPRESSION: Interstitial and alveolar opacity in the right mid lung has slightly increased when compared to prior CT. This may reflect further radiation change, although superimposed infection could have a similar appearance. Electronically Signed   By: Titus Dubin M.D.   On: 07/13/2017 14:44    Time Spent in minutes  25   Lakeshia Dohner M.D on 07/16/2017 at 1:19 PM  Between 7am to 7pm - Pager - [phone]  After 7pm go to [email or web address] - password [facility]  [facility] -  Office  [phone]

## 2017-07-17 DIAGNOSIS — IMO0002 Reserved for concepts with insufficient information to code with codable children: Secondary | ICD-10-CM | POA: Diagnosis present

## 2017-07-17 DIAGNOSIS — E0865 Diabetes mellitus due to underlying condition with hyperglycemia: Secondary | ICD-10-CM

## 2017-07-17 DIAGNOSIS — J189 Pneumonia, unspecified organism: Secondary | ICD-10-CM | POA: Diagnosis present

## 2017-07-17 DIAGNOSIS — J9600 Acute respiratory failure, unspecified whether with hypoxia or hypercapnia: Secondary | ICD-10-CM | POA: Diagnosis present

## 2017-07-17 LAB — CREATININE, SERUM
CREATININE: 0.97 mg/dL (ref 0.61–1.24)
GFR calc Af Amer: >60 mL/min (ref >60)
GFR calc non Af Amer: >60 mL/min (ref >60)

## 2017-07-17 LAB — GLUCOSE, CAPILLARY
GLUCOSE-CAPILLARY: 157 mg/dL — AB (ref 65–99)
GLUCOSE-CAPILLARY: 170 mg/dL — AB (ref 65–99)

## 2017-07-17 LAB — LEGIONELLA PNEUMOPHILA SEROGP 1 UR AG: L. pneumophila Serogp 1 Ur Ag: NEGATIVE

## 2017-07-17 MED ORDER — GUAIFENESIN-DM 100-10 MG/5ML PO SYRP: 5.0000 mL | ORAL_SOLUTION | ORAL | 0 refills | Status: DC | PRN

## 2017-07-17 MED ORDER — GUAIFENESIN ER 600 MG PO TB12: 600.0000 mg | ORAL_TABLET | Freq: Two times a day (BID) | ORAL | 0 refills | Status: DC

## 2017-07-17 MED ORDER — APIXABAN 5 MG PO TABS: 5.0000 mg | ORAL_TABLET | Freq: Two times a day (BID) | ORAL | 0 refills | Status: AC

## 2017-07-17 MED ORDER — LEVOFLOXACIN 750 MG PO TABS: 750.0000 mg | ORAL_TABLET | Freq: Every day | ORAL | 0 refills | Status: AC

## 2017-07-17 MED ORDER — GLUCERNA SHAKE PO LIQD: 237.0000 mL | Freq: Three times a day (TID) | ORAL | 0 refills | Status: DC

## 2017-07-17 NOTE — Care Management Note (Addendum)
Case Management Note  Patient Details  Name: Matthew Wilkins MRN: [id number] Date of Birth: Nov 23, 1945  Subjective/Objective:                 Admitted with pneumonia. From home, lives alone, has daughter who is very supportive and plans to stay with him after DC. Pt reports he was very ind with ADL's and drove pta. He has cane and walker to use if needed. He has neb machine (his wife's) if he needs one. He is on oxygen acutely. Did not qualify for home O2 per home O2 assessment. PT recommends HH, pt refuses services at this time, not wanting someone to come into his home. Pt offered OP PT and he declines that also.   Action/Plan: DC home today with self care. No HH or DME referrals needed at this time.   Expected Discharge Date:     07/17/2017             Expected Discharge Plan:  Home/Self Care  In-House Referral:  NA  Discharge planning Services  CM Consult  Post Acute Care Choice:  NA Choice offered to:  NA  Status of Service:  Completed, signed off  Sherald Barge, RN 07/17/2017, 11:04 AM

## 2017-07-17 NOTE — Care Management Important Message (Signed)
Important Message  Patient Details  Name: Matthew Wilkins MRN: [id number] Date of Birth: 1945-10-18   Medicare Important Message Given:  Yes    Sherald Barge, RN 07/17/2017, 11:07 AM

## 2017-07-17 NOTE — Discharge Summary (Addendum)
Physician Discharge Summary  Matthew Wilkins:[id number] DOB: 1945-07-08 DOA: 07/13/2017  PCP: [name], MD  Admit date: 07/13/2017 Discharge date: 07/17/2017  Admitted From: Home Disposition: Home  Recommendations for Outpatient Follow-up:  1. Follow up with PCP in 1-2 weeks 2. Follow-up with oncologist in 2 weeks. 3. Patient will complete 7-day course of antibiotic for pneumonia after 07/20/2017.  Home Health: None (patient refused) Equipment/Devices: None  Discharge Condition: Fair CODE STATUS: DNR Diet recommendation: Heart Healthy / Carb Modified with nutritional supplement.   Discharge Diagnoses:  Principal Problem:   HCAP (healthcare-associated pneumonia)   Active Problems:   Acute respiratory failure with hypoxia (HCC)   COPD (chronic obstructive pulmonary disease) (HCC)   PAD (peripheral artery disease) (HCC)   CAD (coronary artery disease)   Malnutrition of moderate degree   Adenocarcinoma of right lung, stage 3 (HCC)   Diabetes mellitus due to underlying condition, uncontrolled ([place])   Brief narrative/HPI Please refer to admission H&P for details, in brief,72 year old male with stage IIIa non-small cell carcinoma on chemoradiation, type 2 diabetes mellitus, CAD, COPD, peripheral vascular disease, hypertension, and history of left lower leg DVT currently of anticoagulation past 2-3 weeks brought to the ED by his daughter with generalized weakness for past 2 weeks.  Patient also complaining of shortness of breath and nonproductive cough, poor appetite and some right-sided abdominal pain.  He was unable to keep his appointment with Dr. Julien Nordmann on 2/14 and also his follow-up appointment on the day of admission. In the ED patient was found to be in SIRS with hypoxia, mildly elevated lactic acid, tachypnea and low blood pressure.  CT exam of the chest was negative for PE but with findings of right lung pneumonia.  CT of the abdomen is unremarkable.  Patient given 2 L IV  fluid bolus and admitted to stepdown unit for SIRS with healthcare associated pneumonia.   Hospital course  Principal Problem: Systemic inflammatory response syndrome (SIRS) ([place]) Healthcare associated pneumonia ([place]) Blood culture growing coag negative staph which is contaminant.    Remains afebrile and shortness of breath improved.  Empiric antibiotics narrowed to Levaquin to complete 7-day course. As needed antitussives.  Stable to be discharged home with outpatient follow-up.  Active Problems: Acute respiratory failure with hypoxia (HCC) Secondary to pneumonia.  Does not appear to have COPD exacerbation.    Evaluated for home O2 needs and patient does not need home oxygen upon discharge.  Hypotension Secondary to SIRS.    Improved with fluids.  Resume blood pressure medication.  Hypothyroidism Continue Synthroid.  Coronary artery disease/PAD Continue statin.  Continue Neurontin and Requip.  Type 2 diabetes mellitus with hyperglycemia, uncontrolled. CBG in 200s-300s.  A1C of 9.1.  Placed on bedtime Lantus and pre-meal aspart.  He is on glipizide and metformin which will be continued upon discharge.  Discussed on starting insulin but patient not interested at this time.  Hypothyroidism Continue Synthroid.      COPD (chronic obstructive pulmonary disease) (HCC) Not in distress.    Continue home inhalers.    Malnutrition of moderate degree/failure to thrive Nutrition consult appreciated and added supplement.  Seen by PT and recommended home health but patient refused.  Please address as outpatient.    History of left leg DVT and and bilateral PE with rt heart strain in April 2018(HCC) Diagnosed in April 2018 and was on anticoagulation until past 3 weeks when he stopped taking his anticoagulation. CT angiogram on admission  Negative for PE. Given hx  for severe PE and hypercoagulable status due to malignancy, he should be on long term anticoagulation for now and instructed  patient to resume his home anticoagulation. .    Adenocarcinoma of right lung, stage 3 (HCC) Currently on chemoradiation.  Has missed his last 2 appointments.    Oncologist has been notified.  Should follow-up as outpatient.  Anemia of chronic disease Stable.  Monitor as outpatient.      Family Communication  : Discussed with daughter at bedside  Disposition Plan  : Home   Procedures  : CT angiogram of the chest    Discharge Instructions   Allergies as of 07/17/2017      Reactions   Aspirin Other (See Comments)   Nose bleeds       Medication List    TAKE these medications   ADVAIR DISKUS 250-50 MCG/DOSE Aepb Generic drug:  Fluticasone-Salmeterol Inhale into the lungs.  Apixaban (Eliquis) tablet 5 mg Take 1 tablet by mouth twice daily   feeding supplement (GLUCERNA SHAKE) Liqd Take 237 mLs by mouth 3 (three) times daily between meals.   gabapentin 300 MG capsule Commonly known as:  NEURONTIN Take 1 capsule by mouth 3 (three) times daily.   glipiZIDE 5 MG 24 hr tablet Commonly known as:  GLUCOTROL XL Take 10 mg by mouth 2 (two) times daily.   guaiFENesin 600 MG 12 hr tablet Commonly known as:  MUCINEX Take 1 tablet (600 mg total) by mouth 2 (two) times daily.   guaiFENesin-dextromethorphan 100-10 MG/5ML syrup Commonly known as:  ROBITUSSIN DM Take 5 mLs by mouth every 4 (four) hours as needed for cough.   levofloxacin 750 MG tablet Commonly known as:  LEVAQUIN Take 1 tablet (750 mg total) by mouth daily for 3 days. Start taking on:  07/18/2017   metFORMIN 500 MG 24 hr tablet Commonly known as:  GLUCOPHAGE-XR Take 2 tablets by mouth 2 (two) times daily.   pantoprazole 40 MG tablet Commonly known as:  PROTONIX Take 40 mg by mouth 2 (two) times daily.   rosuvastatin 20 MG tablet Commonly known as:  CRESTOR Take 1 tablet (20 mg total) by mouth daily.   sertraline 25 MG tablet Commonly known as:  ZOLOFT Take 50 mg by mouth daily.   VENTOLIN  HFA 108 (90 Base) MCG/ACT inhaler Generic drug:  albuterol   albuterol 108 (90 Base) MCG/ACT inhaler Commonly known as:  PROVENTIL HFA;VENTOLIN HFA Inhale into the lungs.   ZETIA 10 MG tablet Generic drug:  ezetimibe Take 10 mg by mouth daily.      Follow-up Information    [name], MD. Schedule an appointment as soon as possible for a visit in 1 week(s).   Specialty:  Family Medicine Contact information: [address] [place] [address] [phone]        Curt Bears, MD Follow up in 2 week(s).   Specialty:  Oncology Contact information: [address] [place] [address] [phone]          Allergies  Allergen Reactions  . Aspirin Other (See Comments)    Nose bleeds       Procedures/Studies: Ct Angio Chest Pe W And/or Wo Contrast  Result Date: 07/13/2017 CLINICAL DATA:  Increasing shortness of breath with worsening weakness. History of lung cancer. Abdominal pain. EXAM: CT ANGIOGRAPHY CHEST CT ABDOMEN AND PELVIS WITH CONTRAST TECHNIQUE: Multidetector CT imaging of the chest was performed using the standard protocol during bolus administration of intravenous contrast. Multiplanar CT  image reconstructions and MIPs were obtained to evaluate the vascular anatomy. Multidetector CT imaging of the abdomen and pelvis was performed using the standard protocol during bolus administration of intravenous contrast. CONTRAST:  177mL ISOVUE-370 IOPAMIDOL (ISOVUE-370) INJECTION 76% COMPARISON:  Chest CT 05/23/2017.  Abdomen and pelvis CT 08/28/2016. FINDINGS: CTA CHEST FINDINGS Cardiovascular: Heart size normal. No pericardial effusion. Coronary artery calcification is evident. Atherosclerotic calcification is noted in the wall of the thoracic aorta. No filling defect in the opacified pulmonary arteries to suggest the presence of an acute pulmonary embolus. Mediastinum/Nodes: 8 mm subcarinal lymph node measured on the prior study is now 11 mm. No left  hilar lymphadenopathy. 7 mm short axis right hilar lymph node evident. Mild circumferential wall thickening noted in the esophagus with tiny hiatal hernia evident. Lungs/Pleura: Centrilobular and paraseptal emphysema noted in the upper lobes. Similar bandlike interstitial and airspace opacity in the right lung with interval progression of airspace disease in the right lower lobe. Small right pleural effusion evident. Tiny anterior right lung nodule seen on the previous study has become integrated into the confluent bandlike right lung opacity. Musculoskeletal: Bone windows reveal no worrisome lytic or sclerotic osseous lesions. Review of the MIP images confirms the above findings. CT ABDOMEN and PELVIS FINDINGS Hepatobiliary: No focal abnormality within the liver parenchyma. There is no evidence for gallstones, gallbladder wall thickening, or pericholecystic fluid. No intrahepatic or extrahepatic biliary dilation. Pancreas: No focal mass lesion. No dilatation of the main duct. No intraparenchymal cyst. No peripancreatic edema. Spleen: No splenomegaly. No focal mass lesion. Adrenals/Urinary Tract: No adrenal nodule or mass. Similar appearance 2.3 x 1.4 cm stone in the right renal pelvis with mild fullness right intrarenal collecting system. 5 mm lower pole stone seen right kidney. 14 mm low-density lesion in the upper pole the right kidney is stable. No right hydroureter. Punctate nonobstructing stones seen lower pole left kidney. No left hydroureter. The urinary bladder appears normal for the degree of distention. Stomach/Bowel: Tiny hiatal hernia. Stomach is nondistended. No gastric wall thickening. No evidence of outlet obstruction. Duodenum is normally positioned as is the ligament of Treitz. No small bowel wall thickening. No small bowel dilatation. The appendix is normal. Diverticular changes are noted in the left colon without evidence of diverticulitis. Vascular/Lymphatic: There is abdominal aortic  atherosclerosis without aneurysm. There is no gastrohepatic or hepatoduodenal ligament lymphadenopathy. No intraperitoneal or retroperitoneal lymphadenopathy. No pelvic sidewall lymphadenopathy. Reproductive: Prostate gland is enlarged. Other: No intraperitoneal free fluid. Musculoskeletal: Small groin hernias contain only fat. Bone windows reveal no worrisome lytic or sclerotic osseous lesions. Review of the MIP images confirms the above findings. IMPRESSION: 1. No CT evidence for acute pulmonary embolus. 2. Progression of interstitial and bandlike airspace disease in the right lung, presumably representing evolution of radiation fibrosis. Disease in the right lower lobe is rather progressive when comparing to 05/23/2017 raising concern for superimposed pneumonia. 3. No acute findings in the abdomen. 4. Nonobstructing renal stones, right greater than left. 5.  Aortic Atherosclerois (ICD10-170.0) Electronically Signed   By: Misty Stanley M.D.   On: 07/13/2017 19:46   Ct Abdomen Pelvis W Contrast  Result Date: 07/13/2017 CLINICAL DATA:  Increasing shortness of breath with worsening weakness. History of lung cancer. Abdominal pain. EXAM: CT ANGIOGRAPHY CHEST CT ABDOMEN AND PELVIS WITH CONTRAST TECHNIQUE: Multidetector CT imaging of the chest was performed using the standard protocol during bolus administration of intravenous contrast. Multiplanar CT image reconstructions and MIPs were obtained to evaluate the vascular anatomy. Multidetector  CT imaging of the abdomen and pelvis was performed using the standard protocol during bolus administration of intravenous contrast. CONTRAST:  120mL ISOVUE-370 IOPAMIDOL (ISOVUE-370) INJECTION 76% COMPARISON:  Chest CT 05/23/2017.  Abdomen and pelvis CT 08/28/2016. FINDINGS: CTA CHEST FINDINGS Cardiovascular: Heart size normal. No pericardial effusion. Coronary artery calcification is evident. Atherosclerotic calcification is noted in the wall of the thoracic aorta. No filling  defect in the opacified pulmonary arteries to suggest the presence of an acute pulmonary embolus. Mediastinum/Nodes: 8 mm subcarinal lymph node measured on the prior study is now 11 mm. No left hilar lymphadenopathy. 7 mm short axis right hilar lymph node evident. Mild circumferential wall thickening noted in the esophagus with tiny hiatal hernia evident. Lungs/Pleura: Centrilobular and paraseptal emphysema noted in the upper lobes. Similar bandlike interstitial and airspace opacity in the right lung with interval progression of airspace disease in the right lower lobe. Small right pleural effusion evident. Tiny anterior right lung nodule seen on the previous study has become integrated into the confluent bandlike right lung opacity. Musculoskeletal: Bone windows reveal no worrisome lytic or sclerotic osseous lesions. Review of the MIP images confirms the above findings. CT ABDOMEN and PELVIS FINDINGS Hepatobiliary: No focal abnormality within the liver parenchyma. There is no evidence for gallstones, gallbladder wall thickening, or pericholecystic fluid. No intrahepatic or extrahepatic biliary dilation. Pancreas: No focal mass lesion. No dilatation of the main duct. No intraparenchymal cyst. No peripancreatic edema. Spleen: No splenomegaly. No focal mass lesion. Adrenals/Urinary Tract: No adrenal nodule or mass. Similar appearance 2.3 x 1.4 cm stone in the right renal pelvis with mild fullness right intrarenal collecting system. 5 mm lower pole stone seen right kidney. 14 mm low-density lesion in the upper pole the right kidney is stable. No right hydroureter. Punctate nonobstructing stones seen lower pole left kidney. No left hydroureter. The urinary bladder appears normal for the degree of distention. Stomach/Bowel: Tiny hiatal hernia. Stomach is nondistended. No gastric wall thickening. No evidence of outlet obstruction. Duodenum is normally positioned as is the ligament of Treitz. No small bowel wall  thickening. No small bowel dilatation. The appendix is normal. Diverticular changes are noted in the left colon without evidence of diverticulitis. Vascular/Lymphatic: There is abdominal aortic atherosclerosis without aneurysm. There is no gastrohepatic or hepatoduodenal ligament lymphadenopathy. No intraperitoneal or retroperitoneal lymphadenopathy. No pelvic sidewall lymphadenopathy. Reproductive: Prostate gland is enlarged. Other: No intraperitoneal free fluid. Musculoskeletal: Small groin hernias contain only fat. Bone windows reveal no worrisome lytic or sclerotic osseous lesions. Review of the MIP images confirms the above findings. IMPRESSION: 1. No CT evidence for acute pulmonary embolus. 2. Progression of interstitial and bandlike airspace disease in the right lung, presumably representing evolution of radiation fibrosis. Disease in the right lower lobe is rather progressive when comparing to 05/23/2017 raising concern for superimposed pneumonia. 3. No acute findings in the abdomen. 4. Nonobstructing renal stones, right greater than left. 5.  Aortic Atherosclerois (ICD10-170.0) Electronically Signed   By: Misty Stanley M.D.   On: 07/13/2017 19:46   Dg Chest Port 1 View  Result Date: 07/13/2017 CLINICAL DATA:  Weakness and cough.  History of lung cancer. EXAM: PORTABLE CHEST 1 VIEW COMPARISON:  CT chest dated May 23, 2017. Chest x-ray dated November 04, 2016. FINDINGS: The heart size and mediastinal contours are within normal limits. Normal pulmonary vascularity. Interstitial and alveolar disease in the right mid lung has slightly increased when compared to prior CT. The left lung is clear. No pleural effusion or pneumothorax.  No acute osseous abnormality. IMPRESSION: Interstitial and alveolar opacity in the right mid lung has slightly increased when compared to prior CT. This may reflect further radiation change, although superimposed infection could have a similar appearance. Electronically Signed   By:  Titus Dubin M.D.   On: 07/13/2017 14:44       Subjective: Reports breathing to be much better.  Afebrile.  Discharge Exam: Vitals:   07/17/17 0700 07/17/17 0756  BP: (!) 117/55   Pulse: 67   Resp: 18   Temp: 98.3 F (36.8 C)   SpO2: 97% 96%   Vitals:   07/16/17 2033 07/16/17 2300 07/17/17 0700 07/17/17 0756  BP:  (!) 109/58 (!) 117/55   Pulse: 78 74 67   Resp: 18 19 18    Temp:  98.2 F (36.8 C) 98.3 F (36.8 C)   TempSrc:  Oral Oral   SpO2: 98% 97% 97% 96%  Weight:   67 kg (147 lb 11.3 oz)   Height:         General: Elderly male, appears fatigued HEENT: Moist mucosa, supple neck Chest: Clear to auscultation bilaterally CVS: Normal S1-S2, no murmurs GI: Soft, nondistended, nontender Musculoskeletal: Warm, no edema      The results of significant diagnostics from this hospitalization (including imaging, microbiology, ancillary and laboratory) are listed below for reference.     Microbiology: Recent Results (from the past 240 hour(s))  Culture, blood (Routine X 2) w Reflex to ID Panel     Status: None (Preliminary result)   Collection Time: 07/13/17  4:11 PM  Result Value Ref Range Status   Specimen Description BLOOD LEFT HAND  Final   Special Requests   Final    BOTTLES DRAWN AEROBIC AND ANAEROBIC Blood Culture adequate volume   Culture   Final    NO GROWTH 4 DAYS Performed at [facility], [address] [place], [place] [address]    Report Status PENDING  Incomplete  Culture, blood (Routine X 2) w Reflex to ID Panel     Status: Abnormal   Collection Time: 07/13/17  4:11 PM  Result Value Ref Range Status   Specimen Description   Final    LEFT ANTECUBITAL Performed at [facility], [address], [place], [place] [address]    Special Requests   Final    BOTTLES DRAWN AEROBIC AND ANAEROBIC Blood Culture adequate volume Performed at [facility], [address] [place], [place] [address]    Culture  Setup Time   Final    GRAM POSITIVE COCCI  Gram Stain Report Called to,Read Back By and Verified With: HYLTON,L. AT 6295 ON 07/14/2017 BY EVA AEROBIC BOTTLE ONLY Performed at [facility], READ BACK BY AND VERIFIED WITH: Johny Chess RN 1906 07/14/17 A BROWNING    Culture (A)  Final    STAPHYLOCOCCUS SPECIES (COAGULASE NEGATIVE) THE SIGNIFICANCE OF ISOLATING THIS ORGANISM FROM A SINGLE SET OF BLOOD CULTURES WHEN MULTIPLE SETS ARE DRAWN IS UNCERTAIN. PLEASE NOTIFY THE MICROBIOLOGY DEPARTMENT WITHIN ONE WEEK IF SPECIATION AND SENSITIVITIES ARE REQUIRED. Performed at [facility], [place] [address], [place],  [address]    Report Status 07/15/2017 FINAL  Final  Blood Culture ID Panel (Reflexed)     Status: Abnormal   Collection Time: 07/13/17  4:11 PM  Result Value Ref Range Status   Enterococcus species NOT DETECTED NOT DETECTED Final   Listeria monocytogenes NOT DETECTED NOT DETECTED Final   Staphylococcus species DETECTED (A) NOT  DETECTED Final    Comment: Methicillin (oxacillin) susceptible coagulase negative staphylococcus. Possible blood culture contaminant (unless isolated from more than one blood culture draw or clinical case suggests pathogenicity). No antibiotic treatment is indicated for blood  culture contaminants. CRITICAL RESULT CALLED TO, READ BACK BY AND VERIFIED WITH: L HYLTON RN 1906 07/14/17 A BROWNING    Staphylococcus aureus NOT DETECTED NOT DETECTED Final   Methicillin resistance NOT DETECTED NOT DETECTED Final   Streptococcus species NOT DETECTED NOT DETECTED Final   Streptococcus agalactiae NOT DETECTED NOT DETECTED Final   Streptococcus pneumoniae NOT DETECTED NOT DETECTED Final   Streptococcus pyogenes NOT DETECTED NOT DETECTED Final   Acinetobacter baumannii NOT DETECTED NOT DETECTED Final   Enterobacteriaceae species NOT DETECTED NOT DETECTED Final   Enterobacter cloacae complex NOT DETECTED NOT DETECTED Final   Escherichia coli NOT DETECTED NOT DETECTED Final    Klebsiella oxytoca NOT DETECTED NOT DETECTED Final   Klebsiella pneumoniae NOT DETECTED NOT DETECTED Final   Proteus species NOT DETECTED NOT DETECTED Final   Serratia marcescens NOT DETECTED NOT DETECTED Final   Haemophilus influenzae NOT DETECTED NOT DETECTED Final   Neisseria meningitidis NOT DETECTED NOT DETECTED Final   Pseudomonas aeruginosa NOT DETECTED NOT DETECTED Final   Candida albicans NOT DETECTED NOT DETECTED Final   Candida glabrata NOT DETECTED NOT DETECTED Final   Candida krusei NOT DETECTED NOT DETECTED Final   Candida parapsilosis NOT DETECTED NOT DETECTED Final   Candida tropicalis NOT DETECTED NOT DETECTED Final  Urine Culture     Status: Abnormal   Collection Time: 07/13/17  6:20 PM  Result Value Ref Range Status   Specimen Description   Final    URINE, CLEAN CATCH Performed at [facility], [address], [place], [place] [address]    Special Requests   Final    NONE Performed at [facility], [address], [place], [place] [address]    Culture MULTIPLE SPECIES PRESENT, SUGGEST RECOLLECTION (A)  Final   Report Status 07/15/2017 FINAL  Final  MRSA PCR Screening     Status: None   Collection Time: 07/14/17  4:43 AM  Result Value Ref Range Status   MRSA by PCR NEGATIVE NEGATIVE Final    Comment:        The GeneXpert MRSA Assay (FDA approved for NASAL specimens only), is one component of a comprehensive MRSA colonization surveillance program. It is not intended to diagnose MRSA infection nor to guide or monitor treatment for MRSA infections. Performed at [facility], [address], [place], [place] [address]   Respiratory Panel by PCR     Status: None   Collection Time: 07/14/17  5:00 AM  Result Value Ref Range Status   Adenovirus NOT DETECTED NOT DETECTED Final   Coronavirus 229E NOT DETECTED NOT DETECTED Final   Coronavirus HKU1 NOT DETECTED NOT DETECTED Final   Coronavirus NL63 NOT DETECTED NOT DETECTED Final   Coronavirus OC43 NOT DETECTED NOT  DETECTED Final   Metapneumovirus NOT DETECTED NOT DETECTED Final   Rhinovirus / Enterovirus NOT DETECTED NOT DETECTED Final   Influenza A NOT DETECTED NOT DETECTED Final   Influenza B NOT DETECTED NOT DETECTED Final   Parainfluenza Virus 1 NOT DETECTED NOT DETECTED Final   Parainfluenza Virus 2 NOT DETECTED NOT DETECTED Final   Parainfluenza Virus 3 NOT DETECTED NOT DETECTED Final   Parainfluenza Virus 4 NOT DETECTED NOT DETECTED Final   Respiratory Syncytial Virus NOT DETECTED NOT DETECTED Final   Bordetella pertussis  NOT DETECTED NOT DETECTED Final   Chlamydophila pneumoniae NOT DETECTED NOT DETECTED Final   Mycoplasma pneumoniae NOT DETECTED NOT DETECTED Final     Labs: BNP (last 3 results) No results for input(s): BNP in the last 8760 hours. Basic Metabolic Panel: Recent Labs  Lab 07/13/17 1439 07/14/17 0406 07/15/17 0417 07/17/17 0557  NA 131* 136 136  --   K 3.4* 4.2 4.0  --   CL 98* 108 106  --   CO2 20* 20* 21*  --   GLUCOSE 296* 225* 212*  --   BUN 12 8 8   --   CREATININE 1.09 0.89 0.92 0.97  CALCIUM 9.0 7.8* 8.2*  --    Liver Function Tests: Recent Labs  Lab 07/13/17 1439 07/14/17 0406  AST 21 14*  ALT 12* 8*  ALKPHOS 110 87  BILITOT 0.6 0.3  PROT 7.6 5.4*  ALBUMIN 3.2* 2.2*   No results for input(s): LIPASE, AMYLASE in the last 168 hours. No results for input(s): AMMONIA in the last 168 hours. CBC: Recent Labs  Lab 07/13/17 1439 07/14/17 0406 07/15/17 0417  WBC 5.5 5.0 5.5  HGB 12.5* 9.9* 10.1*  HCT 38.3* 30.1* 31.3*  MCV 84.0 84.1 84.8  PLT 413* 321 355   Cardiac Enzymes: Recent Labs  Lab 07/13/17 1439  TROPONINI <0.03   BNP: Invalid input(s): POCBNP CBG: Recent Labs  Lab 07/16/17 0806 07/16/17 1152 07/16/17 1641 07/16/17 2325 07/17/17 0750  GLUCAP 176* 206* 162* 143* 157*   D-Dimer No results for input(s): DDIMER in the last 72 hours. Hgb A1c No results for input(s): HGBA1C in the last 72 hours. Lipid Profile No results  for input(s): CHOL, HDL, LDLCALC, TRIG, CHOLHDL, LDLDIRECT in the last 72 hours. Thyroid function studies No results for input(s): TSH, T4TOTAL, T3FREE, THYROIDAB in the last 72 hours.  Invalid input(s): FREET3 Anemia work up No results for input(s): VITAMINB12, FOLATE, FERRITIN, TIBC, IRON, RETICCTPCT in the last 72 hours. Urinalysis    Component Value Date/Time   COLORURINE YELLOW 07/13/2017 1820   APPEARANCEUR CLEAR 07/13/2017 1820   LABSPEC 1.029 07/13/2017 1820   PHURINE 7.0 07/13/2017 1820   GLUCOSEU >=500 (A) 07/13/2017 1820   HGBUR NEGATIVE 07/13/2017 1820   BILIRUBINUR NEGATIVE 07/13/2017 1820   KETONESUR NEGATIVE 07/13/2017 1820   PROTEINUR NEGATIVE 07/13/2017 1820   UROBILINOGEN 0.2 01/27/2010 1529   NITRITE NEGATIVE 07/13/2017 1820   LEUKOCYTESUR NEGATIVE 07/13/2017 1820   Sepsis Labs Invalid input(s): PROCALCITONIN,  WBC,  [facility] Microbiology Recent Results (from the past 240 hour(s))  Culture, blood (Routine X 2) w Reflex to ID Panel     Status: None (Preliminary result)   Collection Time: 07/13/17  4:11 PM  Result Value Ref Range Status   Specimen Description BLOOD LEFT HAND  Final   Special Requests   Final    BOTTLES DRAWN AEROBIC AND ANAEROBIC Blood Culture adequate volume   Culture   Final    NO GROWTH 4 DAYS Performed at [facility], [address] [place], [place] [address]    Report Status PENDING  Incomplete  Culture, blood (Routine X 2) w Reflex to ID Panel     Status: Abnormal   Collection Time: 07/13/17  4:11 PM  Result Value Ref Range Status   Specimen Description   Final    LEFT ANTECUBITAL Performed at [facility], [address], [place], [place] [address]    Special Requests   Final    BOTTLES DRAWN AEROBIC AND ANAEROBIC Blood  Culture adequate volume Performed at [facility], [address], [place], [place] [address]    Culture  Setup Time   Final    GRAM POSITIVE COCCI Gram Stain Report Called to,Read Back By and Verified With:  HYLTON,L. AT 8413 ON 07/14/2017 BY EVA AEROBIC BOTTLE ONLY Performed at [facility], READ BACK BY AND VERIFIED WITH: L HYLTON RN 1906 07/14/17 A BROWNING    Culture (A)  Final    STAPHYLOCOCCUS SPECIES (COAGULASE NEGATIVE) THE SIGNIFICANCE OF ISOLATING THIS ORGANISM FROM A SINGLE SET OF BLOOD CULTURES WHEN MULTIPLE SETS ARE DRAWN IS UNCERTAIN. PLEASE NOTIFY THE MICROBIOLOGY DEPARTMENT WITHIN ONE WEEK IF SPECIATION AND SENSITIVITIES ARE REQUIRED. Performed at [facility], [place] [address], [place], [place] [address]    Report Status 07/15/2017 FINAL  Final  Blood Culture ID Panel (Reflexed)     Status: Abnormal   Collection Time: 07/13/17  4:11 PM  Result Value Ref Range Status   Enterococcus species NOT DETECTED NOT DETECTED Final   Listeria monocytogenes NOT DETECTED NOT DETECTED Final   Staphylococcus species DETECTED (A) NOT DETECTED Final    Comment: Methicillin (oxacillin) susceptible coagulase negative staphylococcus. Possible blood culture contaminant (unless isolated from more than one blood culture draw or clinical case suggests pathogenicity). No antibiotic treatment is indicated for blood  culture contaminants. CRITICAL RESULT CALLED TO, READ BACK BY AND VERIFIED WITH: L HYLTON RN 1906 07/14/17 A BROWNING    Staphylococcus aureus NOT DETECTED NOT DETECTED Final   Methicillin resistance NOT DETECTED NOT DETECTED Final   Streptococcus species NOT DETECTED NOT DETECTED Final   Streptococcus agalactiae NOT DETECTED NOT DETECTED Final   Streptococcus pneumoniae NOT DETECTED NOT DETECTED Final   Streptococcus pyogenes NOT DETECTED NOT DETECTED Final   Acinetobacter baumannii NOT DETECTED NOT DETECTED Final   Enterobacteriaceae species NOT DETECTED NOT DETECTED Final   Enterobacter cloacae complex NOT DETECTED NOT DETECTED Final   Escherichia coli NOT DETECTED NOT DETECTED Final   Klebsiella oxytoca NOT DETECTED NOT DETECTED Final   Klebsiella  pneumoniae NOT DETECTED NOT DETECTED Final   Proteus species NOT DETECTED NOT DETECTED Final   Serratia marcescens NOT DETECTED NOT DETECTED Final   Haemophilus influenzae NOT DETECTED NOT DETECTED Final   Neisseria meningitidis NOT DETECTED NOT DETECTED Final   Pseudomonas aeruginosa NOT DETECTED NOT DETECTED Final   Candida albicans NOT DETECTED NOT DETECTED Final   Candida glabrata NOT DETECTED NOT DETECTED Final   Candida krusei NOT DETECTED NOT DETECTED Final   Candida parapsilosis NOT DETECTED NOT DETECTED Final   Candida tropicalis NOT DETECTED NOT DETECTED Final  Urine Culture     Status: Abnormal   Collection Time: 07/13/17  6:20 PM  Result Value Ref Range Status   Specimen Description   Final    URINE, CLEAN CATCH Performed at [facility], [address], [place], [place] [address]    Special Requests   Final    NONE Performed at [facility], [address], [place], [place] [address]    Culture MULTIPLE SPECIES PRESENT, SUGGEST RECOLLECTION (A)  Final   Report Status 07/15/2017 FINAL  Final  MRSA PCR Screening     Status: None   Collection Time: 07/14/17  4:43 AM  Result Value Ref Range Status   MRSA by PCR NEGATIVE NEGATIVE Final    Comment:        The GeneXpert MRSA Assay (FDA approved for NASAL specimens only), is one component of a  comprehensive MRSA colonization surveillance program. It is not intended to diagnose MRSA infection nor to guide or monitor treatment for MRSA infections. Performed at [facility], [address] [place], [place] [address]   Respiratory Panel by PCR     Status: None   Collection Time: 07/14/17  5:00 AM  Result Value Ref Range Status   Adenovirus NOT DETECTED NOT DETECTED Final   Coronavirus 229E NOT DETECTED NOT DETECTED Final   Coronavirus HKU1 NOT DETECTED NOT DETECTED Final   Coronavirus NL63 NOT DETECTED NOT DETECTED Final   Coronavirus OC43 NOT DETECTED NOT DETECTED Final   Metapneumovirus NOT DETECTED NOT DETECTED Final    Rhinovirus / Enterovirus NOT DETECTED NOT DETECTED Final   Influenza A NOT DETECTED NOT DETECTED Final   Influenza B NOT DETECTED NOT DETECTED Final   Parainfluenza Virus 1 NOT DETECTED NOT DETECTED Final   Parainfluenza Virus 2 NOT DETECTED NOT DETECTED Final   Parainfluenza Virus 3 NOT DETECTED NOT DETECTED Final   Parainfluenza Virus 4 NOT DETECTED NOT DETECTED Final   Respiratory Syncytial Virus NOT DETECTED NOT DETECTED Final   Bordetella pertussis NOT DETECTED NOT DETECTED Final   Chlamydophila pneumoniae NOT DETECTED NOT DETECTED Final   Mycoplasma pneumoniae NOT DETECTED NOT DETECTED Final     Time coordinating discharge: Over 30 minutes  SIGNED:   Louellen Molder, MD  [facility] 07/17/2017, 11:29 AM Pager   If 7PM-7AM, please contact night-coverage [email or web address] Password [email or web address]

## 2017-07-17 NOTE — Care Management (Signed)
Patient Information   Patient Name Matthew Wilkins, Matthew Wilkins ([id number]) Sex Male DOB 1946/04/24  Room Bed  A340 A340-01  Patient Demographics   Address [address] [place] [address] Phone [phone] (Home) [phone] (Mobile) *Preferred*  Patient Ethnicity & Race   Ethnic Group Patient Race  Not Hispanic or Latino White or Caucasian  Emergency Contact(s)   Name Relation Home Work Mobile  Rhew,Valeria Daughter [phone]  [phone]  Documents on File    Status Date Received Description  Documents for the Patient  EMR Medication Summary Not Received    EMR Problem Summary Not Received    EMR Patient Summary Not Received    EMR Patient Summary Not Received    [place] Received 08/09/07   [place] E-Signature HIPAA Notice of Privacy Received 08/19/10   [place] E-Signature HIPAA Notice of Privacy [place] Not Received    Driver's License Received 22/29/79   Advance Directives/Living Will/HCPOA/POA Received 89/21/19   Driver's License Not Received    Driver's License Not Received    Insurance Card Received 08/19/10   Insurance Card Not Received    Driver's License Not Received    Editor, commissioning Not Received    AMB Correspondence Not Received  10/12 Referral [employer] Card Not Received    Insurance Card Not Received    HIM ROI Authorization Not Received  ECS/[employer] Medicare Risk Adjustment Review  Insurance Card Received 10/07/14 aetna/lw/chmght  Insurance Card Received 02/06/13   Advanced Beneficiary Notice (ABN) Not Received    Insurance Card Received 07/23/13   AMB Correspondence  08/27/13 03/15 approved [employer]  Other Photo ID Not Received    HIM ROI Authorization  03/13/15 [facility]/Aetna Risk Adjustment Review  Insurance Card Received 06/04/15 [place] Medicare 2017- inactive  HIM ROI Authorization (Expired) 01/04/16 Authorization for batch [employer]/AETNA Medicare Risk Adjusment  Review  fbg  01/04/2016  Insurance Card Received 10/25/16 Aetna Medicare 2018 - active  Release of Information     Release of Information Received 10/26/16 DPR Signed 2018 [facility]  AMB Correspondence  11/03/16 REFERRAL [facility] [place]  AMB Correspondence  11/03/16 REFERRAL APPOINTMENT [facility]  Release of Information Received 11/17/16 [facility]  HIM ROI Authorization  12/06/16 HEALTH DATA INSIGHTS  AMB Correspondence  11/21/16 LETTER [facility]  AMB Correspondence  11/21/16 CASE REQUEST RESULTS [facility]  AMB Correspondence  01/04/17 [facility]  AMB New Patient Records/Historical Received 11/15/16 REFERRAL [facility], P  Patient Photo   Photo of Patient  AMB Correspondence (Deleted) 11/03/16 [facility]  Documents for the Encounter  AOB (Assignment of Insurance Benefits) Not Received    E-signature AOB Signed 07/13/17   MEDICARE RIGHTS Not Received    E-signature Medicare Rights Signed 07/13/17   ED Patient Billing Extract   ED PB Billing Extract  Cardiac Monitoring Strip Shift Summary Received 07/13/17   EKG Received 07/14/17   Admission Information   Attending Provider Admitting Provider Admission Type Admission Date/Time  Louellen Molder, MD Rodena Goldmann, DO Emergency 07/13/17 1358  Discharge Date Hospital Service Auth/Cert Status Service Area   Internal Medicine Incomplete [place]  Unit Room/Bed Admission Status   AP-DEPT 300 A340/A340-01 Admission (Confirmed)   Admission   Complaint  weakness chemo pt  Hospital Account   Name Acct ID Class Status Primary Coverage  Matthew Wilkins, Matthew Wilkins [id number] Inpatient Open [place]  Guarantor Account (for Hospital Account [id number])   Name Relation to Pt Service Area Active? Acct Type  [place], [place] Yes Personal/Family  Address Phone    [phone] [place], [place] [address]  [phone]) [phone])        Coverage Information (for Hospital Account [id number])   F/O Payor/Plan Precert #  AETNA [place] MEDICARE HMO/PPO   Subscriber Subscriber #  Matthew Wilkins, Matthew Wilkins [facility]  Address [place] [address] [address] [phone]

## 2017-07-17 NOTE — Progress Notes (Signed)
SATURATION QUALIFICATIONS: (This note is used to comply with regulatory documentation for home oxygen)  Patient Saturations on Room Air at Rest = 98%  Patient Saturations on Room Air while Ambulating =95%  Patient Saturations on 0 Liters of oxygen while Ambulating = N/A  Please briefly explain why patient needs home oxygen:

## 2017-07-18 LAB — CULTURE, BLOOD (ROUTINE X 2)
Culture: NO GROWTH
SPECIAL REQUESTS: ADEQUATE

## 2017-07-27 ENCOUNTER — Inpatient Hospital Stay (HOSPITAL_BASED_OUTPATIENT_CLINIC_OR_DEPARTMENT_OTHER): Payer: Medicare HMO | Admitting: Oncology

## 2017-07-27 ENCOUNTER — Telehealth: Payer: Self-pay | Admitting: Oncology

## 2017-07-27 ENCOUNTER — Inpatient Hospital Stay: Payer: Medicare HMO | Attending: Oncology

## 2017-07-27 ENCOUNTER — Inpatient Hospital Stay: Payer: Medicare HMO

## 2017-07-27 VITALS — BP 119/84 | HR 76 | Temp 97.7°F | Resp 20 | Ht 69.0 in | Wt 147.9 lb

## 2017-07-27 DIAGNOSIS — C342 Malignant neoplasm of middle lobe, bronchus or lung: Secondary | ICD-10-CM

## 2017-07-27 DIAGNOSIS — Z5112 Encounter for antineoplastic immunotherapy: Secondary | ICD-10-CM | POA: Insufficient documentation

## 2017-07-27 DIAGNOSIS — R5382 Chronic fatigue, unspecified: Secondary | ICD-10-CM

## 2017-07-27 DIAGNOSIS — F17200 Nicotine dependence, unspecified, uncomplicated: Secondary | ICD-10-CM

## 2017-07-27 DIAGNOSIS — E039 Hypothyroidism, unspecified: Secondary | ICD-10-CM | POA: Insufficient documentation

## 2017-07-27 DIAGNOSIS — C3491 Malignant neoplasm of unspecified part of right bronchus or lung: Secondary | ICD-10-CM

## 2017-07-27 LAB — CBC WITH DIFFERENTIAL/PLATELET
BASOS ABS: 0 10*3/uL (ref 0.0–0.1)
Basophils Relative: 0 %
EOS ABS: 0.1 10*3/uL (ref 0.0–0.5)
EOS PCT: 2 %
HCT: 40.1 % (ref 38.4–49.9)
Hemoglobin: 13.3 g/dL (ref 13.0–17.1)
LYMPHS PCT: 11 %
Lymphs Abs: 0.8 10*3/uL — ABNORMAL LOW (ref 0.9–3.3)
MCH: 27.9 pg (ref 27.2–33.4)
MCHC: 33.3 g/dL (ref 32.0–36.0)
MCV: 83.9 fL (ref 79.3–98.0)
MONO ABS: 0.4 10*3/uL (ref 0.1–0.9)
Monocytes Relative: 5 %
Neutro Abs: 6.6 10*3/uL — ABNORMAL HIGH (ref 1.5–6.5)
Neutrophils Relative %: 82 %
Platelets: 310 10*3/uL (ref 140–400)
RBC: 4.78 MIL/uL (ref 4.20–5.82)
RDW: 16.2 % — AB (ref 11.0–14.6)
WBC: 7.9 10*3/uL (ref 4.0–10.3)

## 2017-07-27 LAB — COMPREHENSIVE METABOLIC PANEL
ALBUMIN: 3.5 g/dL (ref 3.5–5.0)
ALK PHOS: 110 U/L (ref 40–150)
ALT: 10 U/L (ref 0–55)
AST: 13 U/L (ref 5–34)
Anion gap: 13 — ABNORMAL HIGH (ref 3–11)
BILIRUBIN TOTAL: 0.5 mg/dL (ref 0.2–1.2)
BUN: 12 mg/dL (ref 7–26)
CO2: 22 mmol/L (ref 22–29)
CREATININE: 1.16 mg/dL (ref 0.70–1.30)
Calcium: 10 mg/dL (ref 8.4–10.4)
Chloride: 100 mmol/L (ref 98–109)
GFR calc Af Amer: >60 mL/min (ref >60)
GFR calc non Af Amer: >60 mL/min (ref >60)
GLUCOSE: 241 mg/dL — AB (ref 70–140)
Potassium: 4.1 mmol/L (ref 3.5–5.1)
Sodium: 135 mmol/L — ABNORMAL LOW (ref 136–145)
TOTAL PROTEIN: 7.9 g/dL (ref 6.4–8.3)

## 2017-07-27 LAB — TSH: TSH: 71.475 u[IU]/mL — ABNORMAL HIGH (ref 0.320–4.118)

## 2017-07-27 MED ORDER — LEVOTHYROXINE SODIUM 75 MCG PO TABS: 75.0000 ug | ORAL_TABLET | Freq: Every day | ORAL | 1 refills | Status: DC

## 2017-07-27 MED ORDER — SODIUM CHLORIDE 0.9 % IV SOLN
Freq: Once | INTRAVENOUS | Status: AC
  Administered 2017-07-27: 13:00:00 via INTRAVENOUS

## 2017-07-27 MED ORDER — SODIUM CHLORIDE 0.9 % IV SOLN
740.0000 mg | Freq: Once | INTRAVENOUS | Status: AC
  Administered 2017-07-27: 740 mg via INTRAVENOUS
  Filled 2017-07-27: qty 10

## 2017-07-27 NOTE — Patient Instructions (Addendum)
[facility] Discharge Instructions for Patients Receiving Chemotherapy  Today you received the following chemotherapy agent: Imfinzi  To help prevent nausea and vomiting after your treatment, we encourage you to take your nausea medication as directed If you develop nausea and vomiting that is not controlled by your nausea medication, call the clinic.   BELOW ARE SYMPTOMS THAT SHOULD BE REPORTED IMMEDIATELY:  *FEVER GREATER THAN 100.5 F  *CHILLS WITH OR WITHOUT FEVER  NAUSEA AND VOMITING THAT IS NOT CONTROLLED WITH YOUR NAUSEA MEDICATION  *UNUSUAL SHORTNESS OF BREATH  *UNUSUAL BRUISING OR BLEEDING  TENDERNESS IN MOUTH AND THROAT WITH OR WITHOUT PRESENCE OF ULCERS  *URINARY PROBLEMS  *BOWEL PROBLEMS  UNUSUAL RASH Items with * indicate a potential emergency and should be followed up as soon as possible.  Feel free to call the clinic should you have any questions or concerns. The clinic phone number is [phone].  Please show the [place] at check-in to the Emergency Department and triage nurse.   Hyperglycemia (High Blood Sugar) Hyperglycemia is when the sugar (glucose) level in your blood is too high. It may not cause symptoms. If you do have symptoms, they may include warning signs, such as:  Feeling more thirsty than normal.  Hunger.  Feeling tired.  Needing to pee (urinate) more than normal.  Blurry eyesight (vision).  You may get other symptoms as it gets worse, such as:  Dry mouth.  Not being hungry (loss of appetite).  Fruity-smelling breath.  Weakness.  Weight gain or loss that is not planned. Weight loss may be fast.  A tingling or numb feeling in your hands or feet.  Headache.  Skin that does not bounce back quickly when it is lightly pinched and released (poor skin turgor).  Pain in your belly (abdomen).  Cuts or bruises that heal slowly.  High blood sugar can happen to people who do or do not have diabetes. High  blood sugar can happen slowly or quickly, and it can be an emergency. Follow these instructions at home: General instructions  Take over-the-counter and prescription medicines only as told by your doctor.  Do not use products that contain nicotine or tobacco, such as cigarettes and e-cigarettes. If you need help quitting, ask your doctor.  Limit alcohol intake to no more than 1 drink per day for nonpregnant women and 2 drinks per day for men. One drink equals 12 oz of beer, 5 oz of wine, or 1 oz of hard liquor.  Manage stress. If you need help with this, ask your doctor.  Keep all follow-up visits as told by your doctor. This is important. Eating and drinking  Stay at a healthy weight.  Exercise regularly, as told by your doctor.  Drink enough fluid, especially when you: ? Exercise. ? Get sick. ? Are in hot temperatures.  Eat healthy foods, such as: ? Low-fat (lean) proteins. ? Complex carbs (complex carbohydrates), such as whole wheat bread or brown rice. ? Fresh fruits and vegetables. ? Low-fat dairy products. ? Healthy fats.  Drink enough fluid to keep your pee (urine) clear or pale yellow. If you have diabetes:  Make sure you know the symptoms of hyperglycemia.  Follow your diabetes management plan, as told by your doctor. Make sure you: ? Take insulin and medicines as told. ? Follow your exercise plan. ? Follow your meal plan. Eat on time. Do not skip meals. ? Check your blood sugar as often as told. Make sure to check  before and after exercise. If you exercise longer or in a different way than you normally do, check your blood sugar more often. ? Follow your sick day plan whenever you cannot eat or drink normally. Make this plan ahead of time with your doctor.  Share your diabetes management plan with people in your workplace, school, and household.  Check your urine for ketones when you are ill and as told by your doctor.  Carry a card or wear jewelry that says  that you have diabetes. Contact a doctor if:  Your blood sugar level is higher than 240 mg/dL (13.3 mmol/L) for 2 days in a row.  You have problems keeping your blood sugar in your target range.  High blood sugar happens often for you. Get help right away if:  You have trouble breathing.  You have a change in how you think, feel, or act (mental status).  You feel sick to your stomach (nauseous), and that feeling does not go away.  You cannot stop throwing up (vomiting). These symptoms may be an emergency. Do not wait to see if the symptoms will go away. Get medical help right away. Call your local emergency services (911 in the [place] Do not drive yourself to the hospital. Summary  Hyperglycemia is when the sugar (glucose) level in your blood is too high.  High blood sugar can happen to people who do or do not have diabetes.  Make sure you drink enough fluids, eat healthy foods, and exercise regularly.  Contact your doctor if you have problems keeping your blood sugar in your target range. This information is not intended to replace advice given to you by your health care provider. Make sure you discuss any questions you have with your health care provider. Document Released: 02/27/2009 Document Revised: 01/18/2016 Document Reviewed: 01/18/2016 Elsevier Interactive Patient Education  2017 [employer].

## 2017-07-27 NOTE — Progress Notes (Signed)
[facility] OFFICE PROGRESS NOTE  [name], MD [address] [place] [id number]  DIAGNOSIS: Stage IIIA (T1a, N2, M0) non-small cell lung cancer, squamous cell carcinoma presented with right middle lobe pulmonary nodule in addition to ipsilateral hilar and ipsilateral mediastinal lymphadenopathy diagnosed in June 2018.  PRIOR THERAPY: A course of concurrent chemoradiation with weekly carboplatin for AUC of 2 and paclitaxel 45 MG/M2. Status post 7 cycles.  CURRENT THERAPY: Consolidation immunotherapy with Imfinzi (Durvalumab) 10 MG/KG every 2 weeks.first dose 02/22/2017.  Status post 8 cycles.  INTERVAL HISTORY: Matthew Wilkins 72 y.o. male returns for routine follow-up visit accompanied by his daughter.  The patient was recently hospitalized for pneumonia.  He has completed all of his antibiotics and is feeling better.  He still reports dyspnea on exertion.  He continues to smoke.  The patient has missed several doses of his Imfinzi.  It was last given on 06/15/2017.  He denies fevers and chills.  Denies chest pain, shortness of breath at rest, cough, hemoptysis.  Denies nausea, vomiting, constipation, diarrhea.  No skin rashes.  The patient is here for evaluation prior to cycle #9 of his treatment and to review CT scans performed during his hospitalization.  MEDICAL HISTORY: Past Medical History:  Diagnosis Date  . Adenocarcinoma of right lung, stage 3 ([facility]) 11/17/2016  . Anxiety   . Arthritis   . Coronary artery disease   . Diabetes mellitus   . DVT (deep venous thrombosis) ([place])   . Encounter for antineoplastic chemotherapy 11/17/2016  . History of radiation therapy 11/28/16-01/06/17   right lung was treated to 60 Gy in 30 fractions  . Hypercholesterolemia   . Hypertension   . PAD (peripheral artery disease) ([place])   . SOB (shortness of breath)     ALLERGIES:  is allergic to aspirin.  MEDICATIONS:  Current Outpatient Medications  Medication Sig  Dispense Refill  . albuterol (PROVENTIL HFA;VENTOLIN HFA) 108 (90 Base) MCG/ACT inhaler Inhale into the lungs.    [name] apixaban (ELIQUIS) 5 MG TABS tablet Take 1 tablet (5 mg total) by mouth 2 (two) times daily. 60 tablet 0  . feeding supplement, GLUCERNA SHAKE, (GLUCERNA SHAKE) LIQD Take 237 mLs by mouth 3 (three) times daily between meals. 90 Can 0  . Fluticasone-Salmeterol (ADVAIR DISKUS) 250-50 MCG/DOSE AEPB Inhale into the lungs.    . gabapentin (NEURONTIN) 300 MG capsule Take 1 capsule by mouth 3 (three) times daily.    [name] glipiZIDE (GLUCOTROL XL) 5 MG 24 hr tablet Take 10 mg by mouth 2 (two) times daily.     [name] guaiFENesin (MUCINEX) 600 MG 12 hr tablet Take 1 tablet (600 mg total) by mouth 2 (two) times daily. 10 tablet 0  . guaiFENesin-dextromethorphan (ROBITUSSIN DM) 100-10 MG/5ML syrup Take 5 mLs by mouth every 4 (four) hours as needed for cough. 118 mL 0  . levothyroxine (SYNTHROID) 75 MCG tablet Take 1 tablet (75 mcg total) by mouth daily before breakfast. 30 tablet 1  . metFORMIN (GLUCOPHAGE-XR) 500 MG 24 hr tablet Take 2 tablets by mouth 2 (two) times daily.    . pantoprazole (PROTONIX) 40 MG tablet Take 40 mg by mouth 2 (two) times daily.     . rosuvastatin (CRESTOR) 20 MG tablet Take 1 tablet (20 mg total) by mouth daily. 30 tablet 11  . sertraline (ZOLOFT) 25 MG tablet Take 50 mg by mouth daily.     . VENTOLIN HFA 108 (90 Base) MCG/ACT inhaler     .  ZETIA 10 MG tablet Take 10 mg by mouth daily.     No current facility-administered medications for this visit.     SURGICAL HISTORY:  Past Surgical History:  Procedure Laterality Date  . CARDIAC CATHETERIZATION N/A 06/10/2015   Procedure: Left Heart Cath and Coronary Angiography;  Surgeon: Burnell Blanks, MD;  Location: [facility];  Service: Cardiovascular;  Laterality: N/A;  . LOWER EXTREMITY ANGIOGRAM N/A 07/13/2011   Procedure: LOWER EXTREMITY ANGIOGRAM;  Surgeon: Burnell Blanks, MD;  Location: [facility] CATH LAB;   Service: Cardiovascular;  Laterality: N/A;  . OTHER SURGICAL HISTORY    . OTHER SURGICAL HISTORY    . OTHER SURGICAL HISTORY    . PERCUTANEOUS STENT INTERVENTION Right 07/13/2011   Procedure: PERCUTANEOUS STENT INTERVENTION;  Surgeon: Burnell Blanks, MD;  Location: [facility] CATH LAB;  Service: Cardiovascular;  Laterality: Right;    REVIEW OF SYSTEMS:   Review of Systems  Constitutional: Negative for appetite change, chills, fatigue, fever and unexpected weight change.  HENT:   Negative for mouth sores, nosebleeds, sore throat and trouble swallowing.   Eyes: Negative for eye problems and icterus.  Respiratory: Negative for cough, hemoptysis, shortness of breath at rest.  Positive for shortness of breath with exertion. Cardiovascular: Negative for chest pain and leg swelling.  Gastrointestinal: Negative for abdominal pain, constipation, diarrhea, nausea and vomiting.  Genitourinary: Negative for bladder incontinence, difficulty urinating, dysuria, frequency and hematuria.   Musculoskeletal: Negative for back pain, gait problem, neck pain and neck stiffness.  Skin: Negative for itching and rash.  Neurological: Negative for dizziness, extremity weakness, gait problem, headaches, light-headedness and seizures.  Hematological: Negative for adenopathy. Does not bruise/bleed easily.  Psychiatric/Behavioral: Negative for confusion, depression and sleep disturbance. The patient is not nervous/anxious.     PHYSICAL EXAMINATION:  Blood pressure 119/84, pulse 76, temperature 97.7 F (36.5 C), temperature source Oral, resp. rate 20, height 5\' 9"  (1.753 m), weight 147 lb 14.4 oz (67.1 kg), SpO2 100 %.  ECOG PERFORMANCE STATUS: 1 - Symptomatic but completely ambulatory  Physical Exam  Constitutional: Oriented to person, place, and time and well-developed, well-nourished, and in no distress. No distress.  HENT:  Head: Normocephalic and atraumatic.  Mouth/Throat: Oropharynx is clear and moist. No  oropharyngeal exudate.  Eyes: Conjunctivae are normal. Right eye exhibits no discharge. Left eye exhibits no discharge. No scleral icterus.  Neck: Normal range of motion. Neck supple.  Cardiovascular: Normal rate, regular rhythm, normal heart sounds and intact distal pulses.   Pulmonary/Chest: Effort normal and breath sounds normal. No respiratory distress. No rales. Scattered wheezes noted in the posterior lung fields. Abdominal: Soft. Bowel sounds are normal. Exhibits no distension and no mass. There is no tenderness.  Musculoskeletal: Normal range of motion. Exhibits no edema.  Lymphadenopathy:    No cervical adenopathy.  Neurological: Alert and oriented to person, place, and time. Exhibits normal muscle tone. Gait normal. Coordination normal.  Skin: Skin is warm and dry. No rash noted. Not diaphoretic. No erythema. No pallor.  Psychiatric: Mood, memory and judgment normal.  Vitals reviewed.  LABORATORY DATA: Lab Results  Component Value Date   WBC 7.9 07/27/2017   HGB 13.3 07/27/2017   HCT 40.1 07/27/2017   MCV 83.9 07/27/2017   PLT 310 07/27/2017      Chemistry      Component Value Date/Time   NA 135 (L) 07/27/2017 1125   NA 136 05/04/2017 1036   K 4.1 07/27/2017 1125   K 4.0 05/04/2017  1036   CL 100 07/27/2017 1125   CO2 22 07/27/2017 1125   CO2 16 (L) 05/04/2017 1036   BUN 12 07/27/2017 1125   BUN 15.5 05/04/2017 1036   CREATININE 1.16 07/27/2017 1125   CREATININE 1.1 05/04/2017 1036      Component Value Date/Time   CALCIUM 10.0 07/27/2017 1125   CALCIUM 9.6 05/04/2017 1036   ALKPHOS 110 07/27/2017 1125   ALKPHOS 93 05/04/2017 1036   AST 13 07/27/2017 1125   AST 13 05/04/2017 1036   ALT 10 07/27/2017 1125   ALT 7 05/04/2017 1036   BILITOT 0.5 07/27/2017 1125   BILITOT 0.56 05/04/2017 1036       RADIOGRAPHIC STUDIES:  Ct Angio Chest Pe W And/or Wo Contrast  Result Date: 07/13/2017 CLINICAL DATA:  Increasing shortness of breath with worsening weakness.  History of lung cancer. Abdominal pain. EXAM: CT ANGIOGRAPHY CHEST CT ABDOMEN AND PELVIS WITH CONTRAST TECHNIQUE: Multidetector CT imaging of the chest was performed using the standard protocol during bolus administration of intravenous contrast. Multiplanar CT image reconstructions and MIPs were obtained to evaluate the vascular anatomy. Multidetector CT imaging of the abdomen and pelvis was performed using the standard protocol during bolus administration of intravenous contrast. CONTRAST:  194mL ISOVUE-370 IOPAMIDOL (ISOVUE-370) INJECTION 76% COMPARISON:  Chest CT 05/23/2017.  Abdomen and pelvis CT 08/28/2016. FINDINGS: CTA CHEST FINDINGS Cardiovascular: Heart size normal. No pericardial effusion. Coronary artery calcification is evident. Atherosclerotic calcification is noted in the wall of the thoracic aorta. No filling defect in the opacified pulmonary arteries to suggest the presence of an acute pulmonary embolus. Mediastinum/Nodes: 8 mm subcarinal lymph node measured on the prior study is now 11 mm. No left hilar lymphadenopathy. 7 mm short axis right hilar lymph node evident. Mild circumferential wall thickening noted in the esophagus with tiny hiatal hernia evident. Lungs/Pleura: Centrilobular and paraseptal emphysema noted in the upper lobes. Similar bandlike interstitial and airspace opacity in the right lung with interval progression of airspace disease in the right lower lobe. Small right pleural effusion evident. Tiny anterior right lung nodule seen on the previous study has become integrated into the confluent bandlike right lung opacity. Musculoskeletal: Bone windows reveal no worrisome lytic or sclerotic osseous lesions. Review of the MIP images confirms the above findings. CT ABDOMEN and PELVIS FINDINGS Hepatobiliary: No focal abnormality within the liver parenchyma. There is no evidence for gallstones, gallbladder wall thickening, or pericholecystic fluid. No intrahepatic or extrahepatic biliary  dilation. Pancreas: No focal mass lesion. No dilatation of the main duct. No intraparenchymal cyst. No peripancreatic edema. Spleen: No splenomegaly. No focal mass lesion. Adrenals/Urinary Tract: No adrenal nodule or mass. Similar appearance 2.3 x 1.4 cm stone in the right renal pelvis with mild fullness right intrarenal collecting system. 5 mm lower pole stone seen right kidney. 14 mm low-density lesion in the upper pole the right kidney is stable. No right hydroureter. Punctate nonobstructing stones seen lower pole left kidney. No left hydroureter. The urinary bladder appears normal for the degree of distention. Stomach/Bowel: Tiny hiatal hernia. Stomach is nondistended. No gastric wall thickening. No evidence of outlet obstruction. Duodenum is normally positioned as is the ligament of Treitz. No small bowel wall thickening. No small bowel dilatation. The appendix is normal. Diverticular changes are noted in the left colon without evidence of diverticulitis. Vascular/Lymphatic: There is abdominal aortic atherosclerosis without aneurysm. There is no gastrohepatic or hepatoduodenal ligament lymphadenopathy. No intraperitoneal or retroperitoneal lymphadenopathy. No pelvic sidewall lymphadenopathy. Reproductive: Prostate gland is enlarged.  Other: No intraperitoneal free fluid. Musculoskeletal: Small groin hernias contain only fat. Bone windows reveal no worrisome lytic or sclerotic osseous lesions. Review of the MIP images confirms the above findings. IMPRESSION: 1. No CT evidence for acute pulmonary embolus. 2. Progression of interstitial and bandlike airspace disease in the right lung, presumably representing evolution of radiation fibrosis. Disease in the right lower lobe is rather progressive when comparing to 05/23/2017 raising concern for superimposed pneumonia. 3. No acute findings in the abdomen. 4. Nonobstructing renal stones, right greater than left. 5.  Aortic Atherosclerois (ICD10-170.0) Electronically  Signed   By: Misty Stanley M.D.   On: 07/13/2017 19:46   Ct Abdomen Pelvis W Contrast  Result Date: 07/13/2017 CLINICAL DATA:  Increasing shortness of breath with worsening weakness. History of lung cancer. Abdominal pain. EXAM: CT ANGIOGRAPHY CHEST CT ABDOMEN AND PELVIS WITH CONTRAST TECHNIQUE: Multidetector CT imaging of the chest was performed using the standard protocol during bolus administration of intravenous contrast. Multiplanar CT image reconstructions and MIPs were obtained to evaluate the vascular anatomy. Multidetector CT imaging of the abdomen and pelvis was performed using the standard protocol during bolus administration of intravenous contrast. CONTRAST:  154mL ISOVUE-370 IOPAMIDOL (ISOVUE-370) INJECTION 76% COMPARISON:  Chest CT 05/23/2017.  Abdomen and pelvis CT 08/28/2016. FINDINGS: CTA CHEST FINDINGS Cardiovascular: Heart size normal. No pericardial effusion. Coronary artery calcification is evident. Atherosclerotic calcification is noted in the wall of the thoracic aorta. No filling defect in the opacified pulmonary arteries to suggest the presence of an acute pulmonary embolus. Mediastinum/Nodes: 8 mm subcarinal lymph node measured on the prior study is now 11 mm. No left hilar lymphadenopathy. 7 mm short axis right hilar lymph node evident. Mild circumferential wall thickening noted in the esophagus with tiny hiatal hernia evident. Lungs/Pleura: Centrilobular and paraseptal emphysema noted in the upper lobes. Similar bandlike interstitial and airspace opacity in the right lung with interval progression of airspace disease in the right lower lobe. Small right pleural effusion evident. Tiny anterior right lung nodule seen on the previous study has become integrated into the confluent bandlike right lung opacity. Musculoskeletal: Bone windows reveal no worrisome lytic or sclerotic osseous lesions. Review of the MIP images confirms the above findings. CT ABDOMEN and PELVIS FINDINGS  Hepatobiliary: No focal abnormality within the liver parenchyma. There is no evidence for gallstones, gallbladder wall thickening, or pericholecystic fluid. No intrahepatic or extrahepatic biliary dilation. Pancreas: No focal mass lesion. No dilatation of the main duct. No intraparenchymal cyst. No peripancreatic edema. Spleen: No splenomegaly. No focal mass lesion. Adrenals/Urinary Tract: No adrenal nodule or mass. Similar appearance 2.3 x 1.4 cm stone in the right renal pelvis with mild fullness right intrarenal collecting system. 5 mm lower pole stone seen right kidney. 14 mm low-density lesion in the upper pole the right kidney is stable. No right hydroureter. Punctate nonobstructing stones seen lower pole left kidney. No left hydroureter. The urinary bladder appears normal for the degree of distention. Stomach/Bowel: Tiny hiatal hernia. Stomach is nondistended. No gastric wall thickening. No evidence of outlet obstruction. Duodenum is normally positioned as is the ligament of Treitz. No small bowel wall thickening. No small bowel dilatation. The appendix is normal. Diverticular changes are noted in the left colon without evidence of diverticulitis. Vascular/Lymphatic: There is abdominal aortic atherosclerosis without aneurysm. There is no gastrohepatic or hepatoduodenal ligament lymphadenopathy. No intraperitoneal or retroperitoneal lymphadenopathy. No pelvic sidewall lymphadenopathy. Reproductive: Prostate gland is enlarged. Other: No intraperitoneal free fluid. Musculoskeletal: Small groin hernias contain only fat.  Bone windows reveal no worrisome lytic or sclerotic osseous lesions. Review of the MIP images confirms the above findings. IMPRESSION: 1. No CT evidence for acute pulmonary embolus. 2. Progression of interstitial and bandlike airspace disease in the right lung, presumably representing evolution of radiation fibrosis. Disease in the right lower lobe is rather progressive when comparing to 05/23/2017  raising concern for superimposed pneumonia. 3. No acute findings in the abdomen. 4. Nonobstructing renal stones, right greater than left. 5.  Aortic Atherosclerois (ICD10-170.0) Electronically Signed   By: Misty Stanley M.D.   On: 07/13/2017 19:46   Dg Chest Port 1 View  Result Date: 07/13/2017 CLINICAL DATA:  Weakness and cough.  History of lung cancer. EXAM: PORTABLE CHEST 1 VIEW COMPARISON:  CT chest dated May 23, 2017. Chest x-ray dated November 04, 2016. FINDINGS: The heart size and mediastinal contours are within normal limits. Normal pulmonary vascularity. Interstitial and alveolar disease in the right mid lung has slightly increased when compared to prior CT. The left lung is clear. No pleural effusion or pneumothorax. No acute osseous abnormality. IMPRESSION: Interstitial and alveolar opacity in the right mid lung has slightly increased when compared to prior CT. This may reflect further radiation change, although superimposed infection could have a similar appearance. Electronically Signed   By: Titus Dubin M.D.   On: 07/13/2017 14:44     ASSESSMENT/PLAN:  Adenocarcinoma of right lung, stage 3 ([facility]) This is a very pleasant 72 year old white male with a stage IIIa non-small cell lung cancer, squamous cell carcinoma  The patient underwent a course of concurrent chemoradiation with weekly carboplatin and paclitaxel status post 7 cycles. He had partial response after this treatment.  The patient is currently undergoing consolidation immunotherapy was Imfinzi (Durvalumab) status post 8 cycles. He continues to tolerate his treatment with Imfinzi (Durvalumab) fairly well with no concerning complaints.  He has missed 2 doses due to transportation issues and hospitalization for pneumonia. He is now here to resume his treatment with Imfinzi and to review his restaging CT scans.  The patient was seen with Dr. Julien Nordmann.  He has recovered well from his pneumonia.  CT scans show stable disease.   Recommend that he continue on Imfinzi.  He will proceed with cycle #9 as scheduled today. He will follow-up in 2 weeks for evaluation prior to cycle #10.  He was counseled about smoking cessation.  He is not interested in quitting at this time.  For hypothyroidism, he is currently taking Synthroid 50 mcg daily.  TSH is elevated today and dose has been increased to 75 mcg daily.  A new prescription was sent to his pharmacy.  He was advised to call immediately if he has any concerning symptoms in the interval. The patient voices understanding of current disease status and treatment options and is in agreement with the current care plan. All questions were answered. The patient knows to call the clinic with any problems, questions or concerns. We can certainly see the patient much sooner if necessary.  No orders of the defined types were placed in this encounter.  Mikey Bussing, DNP, AGPCNP-BC, AOCNP 07/27/17  ADDENDUM: Hematology/Oncology Attending: I had a face-to-face encounter with the patient.  I recommended his care plan.  This is a very pleasant 72 years old white male with a stage IIIa non-small cell lung cancer status post induction concurrent chemoradiation.  He is currently on consolidation treatment with immunotherapy was Imfinzi (Durvalumab) status post 8 cycles.  He has been tolerating this  treatment fairly well was no concerning complaints.  He was recently admitted to an open hospital for evaluation and treatment of pneumonia.  Is recovering well.  He missed 2 cycles of his treatment with Imfinzi (Durvalumab) during this.. I recommended for the patient to resume his treatment and he will start cycle #9 today. We will see him back for follow-up visit in 2 weeks for evaluation before starting cycle #10. For the hypothyroidism, we will increase his dose of levothyroxine to 75 mcg p.o. Daily. The patient was advised to call immediately if he has any concerning symptoms in the  interval.  Disclaimer: This note was dictated with voice recognition software. Similar sounding words can inadvertently be transcribed and may be missed upon review. Eilleen Kempf, MD 07/28/17

## 2017-07-27 NOTE — Progress Notes (Signed)
Blood glucose 241. Pt will take metformin and glipizide when he gets home. No s/s of hyperglycemia

## 2017-07-27 NOTE — Assessment & Plan Note (Signed)
This is a very pleasant 72 year old white male with a stage IIIa non-small cell lung cancer, squamous cell carcinoma  The patient underwent a course of concurrent chemoradiation with weekly carboplatin and paclitaxel status post 7 cycles. He had partial response after this treatment.  The patient is currently undergoing consolidation immunotherapy was Imfinzi (Durvalumab) status post 8 cycles. He continues to tolerate his treatment with Imfinzi (Durvalumab) fairly well with no concerning complaints.  He has missed 2 doses due to transportation issues and hospitalization for pneumonia. He is now here to resume his treatment with Imfinzi and to review his restaging CT scans.  The patient was seen with Dr. Julien Nordmann.  He has recovered well from his pneumonia.  CT scans show stable disease.  Recommend that he continue on Imfinzi.  He will proceed with cycle #9 as scheduled today. He will follow-up in 2 weeks for evaluation prior to cycle #10.  He was counseled about smoking cessation.  He is not interested in quitting at this time.  For hypothyroidism, he is currently taking Synthroid 50 mcg daily.  TSH is elevated today and dose has been increased to 75 mcg daily.  A new prescription was sent to his pharmacy.  He was advised to call immediately if he has any concerning symptoms in the interval. The patient voices understanding of current disease status and treatment options and is in agreement with the current care plan. All questions were answered. The patient knows to call the clinic with any problems, questions or concerns. We can certainly see the patient much sooner if necessary.

## 2017-07-27 NOTE — Telephone Encounter (Signed)
Scheduled appt per 3/14 los - Gave patient aVS and calender per los.

## 2017-07-28 ENCOUNTER — Encounter: Payer: Self-pay | Admitting: Oncology

## 2017-08-10 ENCOUNTER — Other Ambulatory Visit: Payer: Medicare HMO

## 2017-08-10 ENCOUNTER — Telehealth: Payer: Self-pay | Admitting: *Deleted

## 2017-08-10 ENCOUNTER — Ambulatory Visit: Payer: Medicare HMO | Admitting: Oncology

## 2017-08-10 ENCOUNTER — Ambulatory Visit: Payer: Medicare HMO

## 2017-08-10 NOTE — Telephone Encounter (Signed)
"  Valeria Rhew calling for my father to cancel his appointments for today because my car broke down.  Call me at [phone]."

## 2017-08-18 ENCOUNTER — Other Ambulatory Visit: Payer: Self-pay | Admitting: Oncology

## 2017-08-24 ENCOUNTER — Encounter: Payer: Self-pay | Admitting: Oncology

## 2017-08-24 ENCOUNTER — Telehealth: Payer: Self-pay

## 2017-08-24 ENCOUNTER — Inpatient Hospital Stay (HOSPITAL_BASED_OUTPATIENT_CLINIC_OR_DEPARTMENT_OTHER): Payer: Medicare HMO | Admitting: Oncology

## 2017-08-24 ENCOUNTER — Inpatient Hospital Stay: Payer: Medicare HMO

## 2017-08-24 ENCOUNTER — Inpatient Hospital Stay: Payer: Medicare HMO | Attending: Oncology

## 2017-08-24 VITALS — BP 133/80 | HR 76 | Temp 98.0°F | Resp 18 | Ht 69.0 in | Wt 153.7 lb

## 2017-08-24 DIAGNOSIS — Z5112 Encounter for antineoplastic immunotherapy: Secondary | ICD-10-CM | POA: Insufficient documentation

## 2017-08-24 DIAGNOSIS — C342 Malignant neoplasm of middle lobe, bronchus or lung: Secondary | ICD-10-CM | POA: Diagnosis not present

## 2017-08-24 DIAGNOSIS — E119 Type 2 diabetes mellitus without complications: Secondary | ICD-10-CM

## 2017-08-24 DIAGNOSIS — C3491 Malignant neoplasm of unspecified part of right bronchus or lung: Secondary | ICD-10-CM

## 2017-08-24 DIAGNOSIS — E039 Hypothyroidism, unspecified: Secondary | ICD-10-CM

## 2017-08-24 DIAGNOSIS — E118 Type 2 diabetes mellitus with unspecified complications: Secondary | ICD-10-CM

## 2017-08-24 DIAGNOSIS — R5382 Chronic fatigue, unspecified: Secondary | ICD-10-CM

## 2017-08-24 LAB — CBC WITH DIFFERENTIAL/PLATELET
Basophils Absolute: 0.1 10*3/uL (ref 0.0–0.1)
Basophils Relative: 1 %
EOS ABS: 0.1 10*3/uL (ref 0.0–0.5)
EOS PCT: 2 %
HCT: 38.9 % (ref 38.4–49.9)
Hemoglobin: 13.2 g/dL (ref 13.0–17.1)
LYMPHS ABS: 0.7 10*3/uL — AB (ref 0.9–3.3)
LYMPHS PCT: 13 %
MCH: 28.7 pg (ref 27.2–33.4)
MCHC: 33.9 g/dL (ref 32.0–36.0)
MCV: 84.6 fL (ref 79.3–98.0)
MONOS PCT: 8 %
Monocytes Absolute: 0.4 10*3/uL (ref 0.1–0.9)
Neutro Abs: 4.2 10*3/uL (ref 1.5–6.5)
Neutrophils Relative %: 76 %
PLATELETS: 187 10*3/uL (ref 140–400)
RBC: 4.6 MIL/uL (ref 4.20–5.82)
RDW: 15.7 % — ABNORMAL HIGH (ref 11.0–14.6)
WBC: 5.5 10*3/uL (ref 4.0–10.3)

## 2017-08-24 LAB — COMPREHENSIVE METABOLIC PANEL
ALK PHOS: 105 U/L (ref 40–150)
ALT: 7 U/L (ref 0–55)
ANION GAP: 10 (ref 3–11)
AST: 11 U/L (ref 5–34)
Albumin: 3.5 g/dL (ref 3.5–5.0)
BUN: 14 mg/dL (ref 7–26)
CALCIUM: 9.5 mg/dL (ref 8.4–10.4)
CO2: 19 mmol/L — AB (ref 22–29)
CREATININE: 1.03 mg/dL (ref 0.70–1.30)
Chloride: 105 mmol/L (ref 98–109)
Glucose, Bld: 297 mg/dL — ABNORMAL HIGH (ref 70–140)
Potassium: 4 mmol/L (ref 3.5–5.1)
SODIUM: 134 mmol/L — AB (ref 136–145)
Total Bilirubin: 0.4 mg/dL (ref 0.2–1.2)
Total Protein: 6.9 g/dL (ref 6.4–8.3)

## 2017-08-24 LAB — TSH: TSH: 37.865 u[IU]/mL — AB (ref 0.320–4.118)

## 2017-08-24 MED ORDER — SODIUM CHLORIDE 0.9 % IV SOLN
10.4000 mg/kg | Freq: Once | INTRAVENOUS | Status: AC
  Administered 2017-08-24: 740 mg via INTRAVENOUS
  Filled 2017-08-24: qty 10

## 2017-08-24 MED ORDER — SODIUM CHLORIDE 0.9 % IV SOLN
Freq: Once | INTRAVENOUS | Status: AC
  Administered 2017-08-24: 16:00:00 via INTRAVENOUS

## 2017-08-24 NOTE — Progress Notes (Signed)
[facility] OFFICE PROGRESS NOTE  Matthew Sacramento, MD [address] [place] [address]  DIAGNOSIS: Stage IIIA (T1a, N2, M0) non-small cell lung cancer, squamous cell carcinoma presented with right middle lobe pulmonary nodule in addition to ipsilateral hilar and ipsilateral mediastinal lymphadenopathy diagnosed in June 2018.  PRIOR THERAPY:  A course of concurrent chemoradiation with weekly carboplatin for AUC of 2 and paclitaxel 45 MG/M2. Status post 7 cycles.  CURRENT THERAPY: Consolidation immunotherapy with Imfinzi (Durvalumab) 10 MG/KG every 2 weeks.first dose 02/22/2017. Status post 9cycles.  INTERVAL HISTORY: Matthew Wilkins 72 y.o. male returns for routine follow-up visit accompanied by his daughter.  The patient is feeling fine today and has no specific complaints.  Patient missed his appointment 2 weeks ago due to lack of transportation.  He has been tolerating his Imfinzi fairly well.  Patient denies fevers and chills.  Denies chest pain, shortness breath, cough, hemoptysis.  Denies nausea, vomiting, constipation, diarrhea.  Denies skin rashes.  The patient is here for evaluation prior to cycle #10 of his treatment.  MEDICAL HISTORY: Past Medical History:  Diagnosis Date  . Adenocarcinoma of right lung, stage 3 ([facility]) 11/17/2016  . Anxiety   . Arthritis   . Coronary artery disease   . Diabetes mellitus   . DVT (deep venous thrombosis) ([facility])   . Encounter for antineoplastic chemotherapy 11/17/2016  . History of radiation therapy 11/28/16-01/06/17   right lung was treated to 60 Gy in 30 fractions  . Hypercholesterolemia   . Hypertension   . PAD (peripheral artery disease) ([facility])   . SOB (shortness of breath)     ALLERGIES:  is allergic to aspirin.  MEDICATIONS:  Current Outpatient Medications  Medication Sig Dispense Refill  . albuterol (PROVENTIL HFA;VENTOLIN HFA) 108 (90 Base) MCG/ACT inhaler Inhale into the lungs.    [name] apixaban (ELIQUIS) 5 MG TABS  tablet Take 1 tablet (5 mg total) by mouth 2 (two) times daily. 60 tablet 0  . feeding supplement, GLUCERNA SHAKE, (GLUCERNA SHAKE) LIQD Take 237 mLs by mouth 3 (three) times daily between meals. 90 Can 0  . Fluticasone-Salmeterol (ADVAIR DISKUS) 250-50 MCG/DOSE AEPB Inhale into the lungs.    . gabapentin (NEURONTIN) 300 MG capsule Take 1 capsule by mouth 3 (three) times daily.    [name] glipiZIDE (GLUCOTROL XL) 5 MG 24 hr tablet Take 10 mg by mouth 2 (two) times daily.     [name] guaiFENesin (MUCINEX) 600 MG 12 hr tablet Take 1 tablet (600 mg total) by mouth 2 (two) times daily. 10 tablet 0  . guaiFENesin-dextromethorphan (ROBITUSSIN DM) 100-10 MG/5ML syrup Take 5 mLs by mouth every 4 (four) hours as needed for cough. 118 mL 0  . levothyroxine (SYNTHROID, LEVOTHROID) 75 MCG tablet TAKE 1 TABLET DAILY BEFORE BREAKFAST. 30 tablet 0  . metFORMIN (GLUCOPHAGE-XR) 500 MG 24 hr tablet Take 2 tablets by mouth 2 (two) times daily.    . pantoprazole (PROTONIX) 40 MG tablet Take 40 mg by mouth 2 (two) times daily.     . rosuvastatin (CRESTOR) 20 MG tablet Take 1 tablet (20 mg total) by mouth daily. 30 tablet 11  . sertraline (ZOLOFT) 25 MG tablet Take 50 mg by mouth daily.     . VENTOLIN HFA 108 (90 Base) MCG/ACT inhaler     . ZETIA 10 MG tablet Take 10 mg by mouth daily.     No current facility-administered medications for this visit.    Facility-Administered Medications Ordered in  Other Visits  Medication Dose Route Frequency Provider Last Rate Last Dose  . durvalumab (IMFINZI) 740 mg in sodium chloride 0.9 % 100 mL chemo infusion  10.4 mg/kg (Treatment Plan Recorded) Intravenous Once Curt Bears, MD 115 mL/hr at 08/24/17 1557 740 mg at 08/24/17 1557    SURGICAL HISTORY:  Past Surgical History:  Procedure Laterality Date  . CARDIAC CATHETERIZATION N/A 06/10/2015   Procedure: Left Heart Cath and Coronary Angiography;  Surgeon: Burnell Blanks, MD;  Location: [facility];  Service:  Cardiovascular;  Laterality: N/A;  . LOWER EXTREMITY ANGIOGRAM N/A 07/13/2011   Procedure: LOWER EXTREMITY ANGIOGRAM;  Surgeon: Burnell Blanks, MD;  Location: [facility] CATH LAB;  Service: Cardiovascular;  Laterality: N/A;  . OTHER SURGICAL HISTORY    . OTHER SURGICAL HISTORY    . OTHER SURGICAL HISTORY    . PERCUTANEOUS STENT INTERVENTION Right 07/13/2011   Procedure: PERCUTANEOUS STENT INTERVENTION;  Surgeon: Burnell Blanks, MD;  Location: [facility] CATH LAB;  Service: Cardiovascular;  Laterality: Right;    REVIEW OF SYSTEMS:   Review of Systems  Constitutional: Negative for appetite change, chills, fatigue, fever and unexpected weight change.  HENT:   Negative for mouth sores, nosebleeds, sore throat and trouble swallowing.   Eyes: Negative for eye problems and icterus.  Respiratory: Negative for cough, hemoptysis, shortness of breath and wheezing.   Cardiovascular: Negative for chest pain and leg swelling.  Gastrointestinal: Negative for abdominal pain, constipation, diarrhea, nausea and vomiting.  Genitourinary: Negative for bladder incontinence, difficulty urinating, dysuria, frequency and hematuria.   Musculoskeletal: Negative for back pain, gait problem, neck pain and neck stiffness.  Skin: Negative for itching and rash.  Neurological: Negative for dizziness, extremity weakness, gait problem, headaches, light-headedness and seizures.  Hematological: Negative for adenopathy. Does not bruise/bleed easily.  Psychiatric/Behavioral: Negative for confusion, depression and sleep disturbance. The patient is not nervous/anxious.     PHYSICAL EXAMINATION:  Blood pressure 133/80, pulse 76, temperature 98 F (36.7 C), temperature source Oral, resp. rate 18, height 5\' 9"  (1.753 m), weight 153 lb 11.2 oz (69.7 kg), SpO2 99 %.  ECOG PERFORMANCE STATUS: 1 - Symptomatic but completely ambulatory  Physical Exam  Constitutional: Oriented to person, place, and time and well-developed,  well-nourished, and in no distress. No distress.  HENT:  Head: Normocephalic and atraumatic.  Mouth/Throat: Oropharynx is clear and moist. No oropharyngeal exudate.  Eyes: Conjunctivae are normal. Right eye exhibits no discharge. Left eye exhibits no discharge. No scleral icterus.  Neck: Normal range of motion. Neck supple.  Cardiovascular: Normal rate, regular rhythm, normal heart sounds and intact distal pulses.   Pulmonary/Chest: Effort normal and breath sounds normal. No respiratory distress. No wheezes. No rales.  Abdominal: Soft. Bowel sounds are normal. Exhibits no distension and no mass. There is no tenderness.  Musculoskeletal: Normal range of motion. Exhibits no edema.  Lymphadenopathy:    No cervical adenopathy.  Neurological: Alert and oriented to person, place, and time. Exhibits normal muscle tone. Gait normal. Coordination normal.  Skin: Skin is warm and dry. No rash noted. Not diaphoretic. No erythema. No pallor.  Psychiatric: Mood, memory and judgment normal.  Vitals reviewed.  LABORATORY DATA: Lab Results  Component Value Date   WBC 5.5 08/24/2017   HGB 13.2 08/24/2017   HCT 38.9 08/24/2017   MCV 84.6 08/24/2017   PLT 187 08/24/2017      Chemistry      Component Value Date/Time   NA 134 (L) 08/24/2017 1400  NA 136 05/04/2017 1036   K 4.0 08/24/2017 1400   K 4.0 05/04/2017 1036   CL 105 08/24/2017 1400   CO2 19 (L) 08/24/2017 1400   CO2 16 (L) 05/04/2017 1036   BUN 14 08/24/2017 1400   BUN 15.5 05/04/2017 1036   CREATININE 1.03 08/24/2017 1400   CREATININE 1.1 05/04/2017 1036      Component Value Date/Time   CALCIUM 9.5 08/24/2017 1400   CALCIUM 9.6 05/04/2017 1036   ALKPHOS 105 08/24/2017 1400   ALKPHOS 93 05/04/2017 1036   AST 11 08/24/2017 1400   AST 13 05/04/2017 1036   ALT 7 08/24/2017 1400   ALT 7 05/04/2017 1036   BILITOT 0.4 08/24/2017 1400   BILITOT 0.56 05/04/2017 1036       RADIOGRAPHIC STUDIES:  No results  found.   ASSESSMENT/PLAN:  Adenocarcinoma of right lung, stage 3 ([facility]) This is a very pleasant 72year old white male with a stage IIIa non-small cell lung cancer, squamous cell carcinoma  The patient underwent a course of concurrent chemoradiation with weekly carboplatin and paclitaxel status post 7 cycles. He had partial response after this treatment.  The patient is currently undergoing consolidation immunotherapy was Imfinzi (Durvalumab) status post9cycles. He continuesto tolerate his treatment with Imfinzi (Durvalumab) fairly well with no concerning complaints.  Recommend that he proceed with cycle 10 today as scheduled. He will follow-up in 2 weeks for evaluation prior to cycle #11.  He was counseled about smoking cessation.  He is not interested in quitting at this time.  For hypothyroidism, he will continue levothyroxine 75 mcg daily.  His dose was recently increased.  He reports 2-3 missed doses over the past few weeks.  TSH is pending today.  The patient's glucose today is 297.  He is not routinely taking his medications.  He has been eating a lot of candy lately.  He was advised to take his medication on a regular basis and to avoid candy as much as possible.  He will follow-up with his primary care provider for further management of his diabetes.  He was advised to call immediately if he has any concerning symptoms in the interval. The patient voices understanding of current disease status and treatment options and is in agreement with the current care plan. All questions were answered. The patient knows to call the clinic with any problems, questions or concerns. We can certainly see the patient much sooner if necessary.   No orders of the defined types were placed in this encounter.  Mikey Bussing, DNP, AGPCNP-BC, AOCNP 08/24/17

## 2017-08-24 NOTE — Telephone Encounter (Signed)
Printed avs and calender of upcoming appointment. Per 4/11 los

## 2017-08-24 NOTE — Patient Instructions (Signed)
[facility] Discharge Instructions for Patients Receiving Chemotherapy  Today you received the following chemotherapy agents Imfinzi.  To help prevent nausea and vomiting after your treatment, we encourage you to take your nausea medication as prescribed.    If you develop nausea and vomiting that is not controlled by your nausea medication, call the clinic.   BELOW ARE SYMPTOMS THAT SHOULD BE REPORTED IMMEDIATELY:  *FEVER GREATER THAN 100.5 F  *CHILLS WITH OR WITHOUT FEVER  NAUSEA AND VOMITING THAT IS NOT CONTROLLED WITH YOUR NAUSEA MEDICATION  *UNUSUAL SHORTNESS OF BREATH  *UNUSUAL BRUISING OR BLEEDING  TENDERNESS IN MOUTH AND THROAT WITH OR WITHOUT PRESENCE OF ULCERS  *URINARY PROBLEMS  *BOWEL PROBLEMS  UNUSUAL RASH Items with * indicate a potential emergency and should be followed up as soon as possible.  Feel free to call the clinic should you have any questions or concerns. The clinic phone number is [phone].  Please show the [facility] at check-in to the Emergency Department and triage nurse.

## 2017-08-24 NOTE — Assessment & Plan Note (Addendum)
This is a very pleasant 72year old white male with a stage IIIa non-small cell lung cancer, squamous cell carcinoma  The patient underwent a course of concurrent chemoradiation with weekly carboplatin and paclitaxel status post 7 cycles. He had partial response after this treatment.  The patient is currently undergoing consolidation immunotherapy was Imfinzi (Durvalumab) status post9cycles. He continuesto tolerate his treatment with Imfinzi (Durvalumab) fairly well with no concerning complaints.  Recommend that he proceed with cycle 10 today as scheduled. He will follow-up in 2 weeks for evaluation prior to cycle #11.  He was counseled about smoking cessation.  He is not interested in quitting at this time.  For hypothyroidism, he will continue levothyroxine 75 mcg daily.  His dose was recently increased.  He reports 2-3 missed doses over the past few weeks.  TSH is pending today.  The patient's glucose today is 297.  He is not routinely taking his medications.  He has been eating a lot of candy lately.  He was advised to take his medication on a regular basis and to avoid candy as much as possible.  He will follow-up with his primary care provider for further management of his diabetes.  He was advised to call immediately if he has any concerning symptoms in the interval. The patient voices understanding of current disease status and treatment options and is in agreement with the current care plan. All questions were answered. The patient knows to call the clinic with any problems, questions or concerns. We can certainly see the patient much sooner if necessary.

## 2017-08-25 ENCOUNTER — Telehealth: Payer: Self-pay | Admitting: Oncology

## 2017-08-25 NOTE — Telephone Encounter (Signed)
R/s 5/23 appt due to [facility] being out of the office in the AM - left message for patient with appt date and time.

## 2017-09-07 ENCOUNTER — Telehealth: Payer: Self-pay | Admitting: *Deleted

## 2017-09-07 ENCOUNTER — Inpatient Hospital Stay: Payer: Medicare HMO | Admitting: Oncology

## 2017-09-07 ENCOUNTER — Inpatient Hospital Stay: Payer: Medicare HMO

## 2017-09-07 NOTE — Telephone Encounter (Signed)
Patient called stated he was unable to attend today's appt due to transportation.

## 2017-09-21 ENCOUNTER — Telehealth: Payer: Self-pay | Admitting: Internal Medicine

## 2017-09-21 ENCOUNTER — Telehealth: Payer: Self-pay | Admitting: *Deleted

## 2017-09-21 ENCOUNTER — Inpatient Hospital Stay: Payer: Medicare HMO | Admitting: Oncology

## 2017-09-21 ENCOUNTER — Inpatient Hospital Stay: Payer: Medicare HMO

## 2017-09-21 NOTE — Telephone Encounter (Signed)
Oncology Nurse Navigator Documentation  Oncology Nurse Navigator Flowsheets 09/21/2017  Navigator Location [facility]  Navigator Encounter Type Telephone/I received message from Matthew Bussing NP that patient has missed several appts due to transportation.  I looked up resources for transportation and called Matthew Wilkins to update. I was unable to reach him but did leave vm message for him to call with my name and phone number.   Telephone Outgoing Call  Treatment Phase Treatment  Barriers/Navigation Needs Education;Coordination of Care  Education Transport During Treatment  Interventions Coordination of Care;Education  Coordination of Care Other  Education Method Verbal  Acuity Level 2  Time Spent with Patient 30

## 2017-09-21 NOTE — Telephone Encounter (Signed)
Patient called in and cancelled appointments due to them not having transportation. Patient said he should have his car back by his 5/23 appts.

## 2017-09-28 ENCOUNTER — Other Ambulatory Visit: Payer: Self-pay | Admitting: Oncology

## 2017-10-05 ENCOUNTER — Inpatient Hospital Stay (HOSPITAL_BASED_OUTPATIENT_CLINIC_OR_DEPARTMENT_OTHER): Payer: Medicare HMO | Admitting: Oncology

## 2017-10-05 ENCOUNTER — Encounter: Payer: Self-pay | Admitting: Oncology

## 2017-10-05 ENCOUNTER — Telehealth: Payer: Self-pay | Admitting: Oncology

## 2017-10-05 ENCOUNTER — Other Ambulatory Visit: Payer: Medicare HMO

## 2017-10-05 ENCOUNTER — Ambulatory Visit: Payer: Medicare HMO | Admitting: Oncology

## 2017-10-05 ENCOUNTER — Ambulatory Visit: Payer: Medicare HMO

## 2017-10-05 ENCOUNTER — Inpatient Hospital Stay: Payer: Medicare HMO

## 2017-10-05 ENCOUNTER — Inpatient Hospital Stay: Payer: Medicare HMO | Attending: Oncology

## 2017-10-05 ENCOUNTER — Encounter: Payer: Self-pay | Admitting: General Practice

## 2017-10-05 VITALS — BP 146/75 | HR 78 | Temp 98.3°F | Resp 18 | Ht 69.0 in | Wt 160.3 lb

## 2017-10-05 DIAGNOSIS — C342 Malignant neoplasm of middle lobe, bronchus or lung: Secondary | ICD-10-CM | POA: Diagnosis not present

## 2017-10-05 DIAGNOSIS — F17200 Nicotine dependence, unspecified, uncomplicated: Secondary | ICD-10-CM

## 2017-10-05 DIAGNOSIS — Z5112 Encounter for antineoplastic immunotherapy: Secondary | ICD-10-CM

## 2017-10-05 DIAGNOSIS — E039 Hypothyroidism, unspecified: Secondary | ICD-10-CM | POA: Insufficient documentation

## 2017-10-05 DIAGNOSIS — C3491 Malignant neoplasm of unspecified part of right bronchus or lung: Secondary | ICD-10-CM

## 2017-10-05 DIAGNOSIS — R5382 Chronic fatigue, unspecified: Secondary | ICD-10-CM

## 2017-10-05 LAB — CBC WITH DIFFERENTIAL/PLATELET
Basophils Absolute: 0.1 10*3/uL (ref 0.0–0.1)
Basophils Relative: 1 %
EOS PCT: 3 %
Eosinophils Absolute: 0.2 10*3/uL (ref 0.0–0.5)
HEMATOCRIT: 38.5 % (ref 38.4–49.9)
Hemoglobin: 13.1 g/dL (ref 13.0–17.1)
LYMPHS ABS: 0.8 10*3/uL — AB (ref 0.9–3.3)
LYMPHS PCT: 14 %
MCH: 28.7 pg (ref 27.2–33.4)
MCHC: 34 g/dL (ref 32.0–36.0)
MCV: 84.4 fL (ref 79.3–98.0)
Monocytes Absolute: 0.5 10*3/uL (ref 0.1–0.9)
Monocytes Relative: 9 %
Neutro Abs: 4.3 10*3/uL (ref 1.5–6.5)
Neutrophils Relative %: 73 %
PLATELETS: 195 10*3/uL (ref 140–400)
RBC: 4.56 MIL/uL (ref 4.20–5.82)
RDW: 15.3 % — ABNORMAL HIGH (ref 11.0–14.6)
WBC: 5.8 10*3/uL (ref 4.0–10.3)

## 2017-10-05 LAB — COMPREHENSIVE METABOLIC PANEL
ALBUMIN: 4.1 g/dL (ref 3.5–5.0)
ALT: 9 U/L (ref 0–55)
AST: 16 U/L (ref 5–34)
Alkaline Phosphatase: 83 U/L (ref 40–150)
Anion gap: 9 (ref 3–11)
BUN: 17 mg/dL (ref 7–26)
CHLORIDE: 104 mmol/L (ref 98–109)
CO2: 21 mmol/L — ABNORMAL LOW (ref 22–29)
Calcium: 9.8 mg/dL (ref 8.4–10.4)
Creatinine, Ser: 1.08 mg/dL (ref 0.70–1.30)
GFR calc Af Amer: >60 mL/min (ref >60)
GFR calc non Af Amer: >60 mL/min (ref >60)
GLUCOSE: 176 mg/dL — AB (ref 70–140)
POTASSIUM: 4.2 mmol/L (ref 3.5–5.1)
Sodium: 134 mmol/L — ABNORMAL LOW (ref 136–145)
Total Bilirubin: 0.4 mg/dL (ref 0.2–1.2)
Total Protein: 7.4 g/dL (ref 6.4–8.3)

## 2017-10-05 LAB — TSH: TSH: 27.809 u[IU]/mL — ABNORMAL HIGH (ref 0.320–4.118)

## 2017-10-05 MED ORDER — SODIUM CHLORIDE 0.9 % IV SOLN
740.0000 mg | Freq: Once | INTRAVENOUS | Status: AC
  Administered 2017-10-05: 740 mg via INTRAVENOUS
  Filled 2017-10-05: qty 10

## 2017-10-05 MED ORDER — SODIUM CHLORIDE 0.9 % IV SOLN
Freq: Once | INTRAVENOUS | Status: AC
  Administered 2017-10-05: 15:00:00 via INTRAVENOUS

## 2017-10-05 NOTE — Telephone Encounter (Signed)
Scheduled appt per 5/23 los - unable to schedule treatment in 2 wks due to capped day - logged - will call pt when appt is scheduled.

## 2017-10-05 NOTE — Progress Notes (Signed)
[facility] CSW Progress Note  Patient's daughter come by and requested food bag.  Food bag provided.  Also encouraged to contact Barbie Haggis for additional assistance.  Edwyna Shell, LCSW Clinical Social Worker Phone:  [phone]

## 2017-10-05 NOTE — Patient Instructions (Signed)
[facility] Discharge Instructions for Patients Receiving Chemotherapy  Today you received the following chemotherapy agents durvalumab (Imfinzi).  To help prevent nausea and vomiting after your treatment, we encourage you to take your nausea medication as prescribed.    If you develop nausea and vomiting that is not controlled by your nausea medication, call the clinic.   BELOW ARE SYMPTOMS THAT SHOULD BE REPORTED IMMEDIATELY:  *FEVER GREATER THAN 100.5 F  *CHILLS WITH OR WITHOUT FEVER  NAUSEA AND VOMITING THAT IS NOT CONTROLLED WITH YOUR NAUSEA MEDICATION  *UNUSUAL SHORTNESS OF BREATH  *UNUSUAL BRUISING OR BLEEDING  TENDERNESS IN MOUTH AND THROAT WITH OR WITHOUT PRESENCE OF ULCERS  *URINARY PROBLEMS  *BOWEL PROBLEMS  UNUSUAL RASH Items with * indicate a potential emergency and should be followed up as soon as possible.  Feel free to call the clinic should you have any questions or concerns. The clinic phone number is [phone].  Please show the [facility] at check-in to the Emergency Department and triage nurse.

## 2017-10-05 NOTE — Progress Notes (Signed)
[facility] OFFICE PROGRESS NOTE  [name], MD [address] [place] [address]  DIAGNOSIS: Stage IIIA (T1a, N2, M0) non-small cell lung cancer, squamous cell carcinoma presented with right middle lobe pulmonary nodule in addition to ipsilateral hilar and ipsilateral mediastinal lymphadenopathy diagnosed in June 2018.  PRIOR THERAPY: A course of concurrent chemoradiation with weekly carboplatin for AUC of 2 and paclitaxel 45 MG/M2. Status post 7 cycles.  CURRENT THERAPY: Consolidation immunotherapy with Imfinzi (Durvalumab) 10 MG/KG every 2 weeks.first dose 02/22/2017. Status post10cycles.  INTERVAL HISTORY: Matthew Wilkins 72 y.o. male returns for routine follow-up visit accompanied by his daughter.  The patient is feeling fine today and has no specific complaints.  The patient has missed his past 2 appointments due to transportation issues.  He was given information regarding his rotation assistance but is not interested in it because he cannot smoke on the transportation.  The patient denies fevers and chills.  Denies chest pain, shortness of breath, cough, hemoptysis.  Denies nausea, vomiting, constipation, diarrhea.  Denies skin rashes.  Denies recent weight loss night sweats.  The patient is here for evaluation prior to cycle #11 of his treatment.  MEDICAL HISTORY: Past Medical History:  Diagnosis Date  . Adenocarcinoma of right lung, stage 3 ([facility]) 11/17/2016  . Anxiety   . Arthritis   . Coronary artery disease   . Diabetes mellitus   . DVT (deep venous thrombosis) ([facility])   . Encounter for antineoplastic chemotherapy 11/17/2016  . History of radiation therapy 11/28/16-01/06/17   right lung was treated to 60 Gy in 30 fractions  . Hypercholesterolemia   . Hypertension   . PAD (peripheral artery disease) ([facility])   . SOB (shortness of breath)     ALLERGIES:  is allergic to aspirin.  MEDICATIONS:  Current Outpatient Medications  Medication Sig Dispense  Refill  . albuterol (PROVENTIL HFA;VENTOLIN HFA) 108 (90 Base) MCG/ACT inhaler Inhale into the lungs.    [name] apixaban (ELIQUIS) 5 MG TABS tablet Take 1 tablet (5 mg total) by mouth 2 (two) times daily. 60 tablet 0  . feeding supplement, GLUCERNA SHAKE, (GLUCERNA SHAKE) LIQD Take 237 mLs by mouth 3 (three) times daily between meals. 90 Can 0  . Fluticasone-Salmeterol (ADVAIR DISKUS) 250-50 MCG/DOSE AEPB Inhale into the lungs.    . gabapentin (NEURONTIN) 300 MG capsule Take 1 capsule by mouth 3 (three) times daily.    [name] glipiZIDE (GLUCOTROL XL) 5 MG 24 hr tablet Take 10 mg by mouth 2 (two) times daily.     [name] guaiFENesin (MUCINEX) 600 MG 12 hr tablet Take 1 tablet (600 mg total) by mouth 2 (two) times daily. 10 tablet 0  . guaiFENesin-dextromethorphan (ROBITUSSIN DM) 100-10 MG/5ML syrup Take 5 mLs by mouth every 4 (four) hours as needed for cough. 118 mL 0  . levothyroxine (SYNTHROID, LEVOTHROID) 75 MCG tablet TAKE 1 TABLET DAILY BEFORE BREAKFAST. 30 tablet 0  . metFORMIN (GLUCOPHAGE-XR) 500 MG 24 hr tablet Take 2 tablets by mouth 2 (two) times daily.    . pantoprazole (PROTONIX) 40 MG tablet Take 40 mg by mouth 2 (two) times daily.     . rosuvastatin (CRESTOR) 20 MG tablet Take 1 tablet (20 mg total) by mouth daily. 30 tablet 11  . sertraline (ZOLOFT) 25 MG tablet Take 50 mg by mouth daily.     . VENTOLIN HFA 108 (90 Base) MCG/ACT inhaler     . ZETIA 10 MG tablet Take 10 mg by  mouth daily.     No current facility-administered medications for this visit.     SURGICAL HISTORY:  Past Surgical History:  Procedure Laterality Date  . CARDIAC CATHETERIZATION N/A 06/10/2015   Procedure: Left Heart Cath and Coronary Angiography;  Surgeon: Burnell Blanks, MD;  Location: [facility];  Service: Cardiovascular;  Laterality: N/A;  . LOWER EXTREMITY ANGIOGRAM N/A 07/13/2011   Procedure: LOWER EXTREMITY ANGIOGRAM;  Surgeon: Burnell Blanks, MD;  Location: [facility] CATH LAB;  Service:  Cardiovascular;  Laterality: N/A;  . OTHER SURGICAL HISTORY    . OTHER SURGICAL HISTORY    . OTHER SURGICAL HISTORY    . PERCUTANEOUS STENT INTERVENTION Right 07/13/2011   Procedure: PERCUTANEOUS STENT INTERVENTION;  Surgeon: Burnell Blanks, MD;  Location: [facility] CATH LAB;  Service: Cardiovascular;  Laterality: Right;    REVIEW OF SYSTEMS:   Review of Systems  Constitutional: Negative for appetite change, chills, fatigue, fever and unexpected weight change.  HENT:   Negative for mouth sores, nosebleeds, sore throat and trouble swallowing.   Eyes: Negative for eye problems and icterus.  Respiratory: Negative for cough, hemoptysis, shortness of breath and wheezing.   Cardiovascular: Negative for chest pain and leg swelling.  Gastrointestinal: Negative for abdominal pain, constipation, diarrhea, nausea and vomiting.  Genitourinary: Negative for bladder incontinence, difficulty urinating, dysuria, frequency and hematuria.   Musculoskeletal: Negative for back pain, gait problem, neck pain and neck stiffness.  Skin: Negative for itching and rash.  Neurological: Negative for dizziness, extremity weakness, gait problem, headaches, light-headedness and seizures.  Hematological: Negative for adenopathy. Does not bruise/bleed easily.  Psychiatric/Behavioral: Negative for confusion, depression and sleep disturbance. The patient is not nervous/anxious.     PHYSICAL EXAMINATION:  Blood pressure (!) 146/75, pulse 78, temperature 98.3 F (36.8 C), temperature source Oral, resp. rate 18, height 5\' 9"  (1.753 m), weight 160 lb 4.8 oz (72.7 kg), SpO2 98 %.  ECOG PERFORMANCE STATUS: 1 - Symptomatic but completely ambulatory  Physical Exam  Constitutional: Oriented to person, place, and time and well-developed, well-nourished, and in no distress. No distress.  HENT:  Head: Normocephalic and atraumatic.  Mouth/Throat: Oropharynx is clear and moist. No oropharyngeal exudate.  Eyes: Conjunctivae are  normal. Right eye exhibits no discharge. Left eye exhibits no discharge. No scleral icterus.  Neck: Normal range of motion. Neck supple.  Cardiovascular: Normal rate, regular rhythm, normal heart sounds and intact distal pulses.   Pulmonary/Chest: Effort normal and breath sounds normal. No respiratory distress. No wheezes. No rales.  Abdominal: Soft. Bowel sounds are normal. Exhibits no distension and no mass. There is no tenderness.  Musculoskeletal: Normal range of motion. Exhibits no edema.  Lymphadenopathy:    No cervical adenopathy.  Neurological: Alert and oriented to person, place, and time. Exhibits normal muscle tone. Gait normal. Coordination normal.  Skin: Skin is warm and dry. No rash noted. Not diaphoretic. No erythema. No pallor.  Psychiatric: Mood, memory and judgment normal.  Vitals reviewed.  LABORATORY DATA: Lab Results  Component Value Date   WBC 5.8 10/05/2017   HGB 13.1 10/05/2017   HCT 38.5 10/05/2017   MCV 84.4 10/05/2017   PLT 195 10/05/2017      Chemistry      Component Value Date/Time   NA 134 (L) 10/05/2017 1333   NA 136 05/04/2017 1036   K 4.2 10/05/2017 1333   K 4.0 05/04/2017 1036   CL 104 10/05/2017 1333   CO2 21 (L) 10/05/2017 1333   CO2 16 (  L) 05/04/2017 1036   BUN 17 10/05/2017 1333   BUN 15.5 05/04/2017 1036   CREATININE 1.08 10/05/2017 1333   CREATININE 1.1 05/04/2017 1036      Component Value Date/Time   CALCIUM 9.8 10/05/2017 1333   CALCIUM 9.6 05/04/2017 1036   ALKPHOS 83 10/05/2017 1333   ALKPHOS 93 05/04/2017 1036   AST 16 10/05/2017 1333   AST 13 05/04/2017 1036   ALT 9 10/05/2017 1333   ALT 7 05/04/2017 1036   BILITOT 0.4 10/05/2017 1333   BILITOT 0.56 05/04/2017 1036       RADIOGRAPHIC STUDIES:  No results found.   ASSESSMENT/PLAN:  Adenocarcinoma of right lung, stage 3 (H[facility]) This is a very pleasant 72year old white male with a stage IIIa non-small cell lung cancer, squamous cell carcinoma  The patient underwent  a course of concurrent chemoradiation with weekly carboplatin and paclitaxel status post 7 cycles. He had partial response after this treatment.  The patient is currently undergoing consolidation immunotherapy was Imfinzi (Durvalumab) status post10cycles. He continuesto tolerate his treatment with Imfinzi (Durvalumab) fairly well with no concerning complaints. He has missed several appointments secondary to transportation issues.  The patient's daughter reports that they now have reliable transportation.  The patient was seen with Dr. Julien Nordmann.  Recommend for him to resume his treatment.  He will proceed with cycle 11 today as scheduled.  Follow-up visit will be in 2 weeks for evaluation prior to cycle #12.  He will have a restaging CT scan of the chest following cycle 12.  This will be need to be ordered at his next visit.  He was counseled about smoking cessation. He is not interested in quitting at this time.  For hypothyroidism, he will continue levothyroxine 75 mcg daily.  TSH from today is pending.  He was advised to call immediately if he has any concerning symptoms in the interval. The patient voices understanding of current disease status and treatment options and is in agreement with the current care plan. All questions were answered. The patient knows to call the clinic with any problems, questions or concerns. We can certainly see the patient much sooner if necessary.   No orders of the defined types were placed in this encounter.  Mikey Bussing, DNP, AGPCNP-BC, AOCNP 10/05/17  ADDENDUM: Hematology/Oncology Attending: This is a very pleasant 72 years old white male with a stage IIIa non-small cell lung cancer, squamous cell carcinoma status post a course of concurrent chemoradiation and he is currently undergoing consolidation treatment with immunotherapy with Imfinzi (Durvalumab) status post 10 cycles.  Unfortunately he missed the last few cycles secondary to car trouble  and inability to come to his visit. I recommended for the patient to resume his treatment today.  He will proceed with cycle #11.  He will come back for follow-up visit in 2 weeks for evaluation before the next cycle of his treatment. The patient was advised to call immediately if he has any concerning symptoms in the interval.  Disclaimer: This note was dictated with voice recognition software. Similar sounding words can inadvertently be transcribed and may be missed upon review. Eilleen Kempf, MD 10/07/17

## 2017-10-05 NOTE — Assessment & Plan Note (Addendum)
This is a very pleasant 72year old white male with a stage IIIa non-small cell lung cancer, squamous cell carcinoma  The patient underwent a course of concurrent chemoradiation with weekly carboplatin and paclitaxel status post 7 cycles. He had partial response after this treatment.  The patient is currently undergoing consolidation immunotherapy was Imfinzi (Durvalumab) status post10cycles. He continuesto tolerate his treatment with Imfinzi (Durvalumab) fairly well with no concerning complaints. He has missed several appointments secondary to transportation issues.  The patient's daughter reports that they now have reliable transportation.  The patient was seen with Dr. Julien Nordmann.  Recommend for him to resume his treatment.  He will proceed with cycle 11 today as scheduled.  Follow-up visit will be in 2 weeks for evaluation prior to cycle #12.  He will have a restaging CT scan of the chest following cycle 12.  This will be need to be ordered at his next visit.  He was counseled about smoking cessation. He is not interested in quitting at this time.  For hypothyroidism, he will continue levothyroxine 75 mcg daily.  TSH from today is pending.  He was advised to call immediately if he has any concerning symptoms in the interval. The patient voices understanding of current disease status and treatment options and is in agreement with the current care plan. All questions were answered. The patient knows to call the clinic with any problems, questions or concerns. We can certainly see the patient much sooner if necessary.

## 2017-10-06 ENCOUNTER — Telehealth: Payer: Self-pay | Admitting: Internal Medicine

## 2017-10-06 NOTE — Telephone Encounter (Signed)
Scheduled appt per 5/23 los - sent reminder letter in the  Mail with 2 copies per patients daughter request . Left message with appt date and time.

## 2017-10-19 ENCOUNTER — Inpatient Hospital Stay (HOSPITAL_BASED_OUTPATIENT_CLINIC_OR_DEPARTMENT_OTHER): Payer: Medicare HMO | Admitting: Internal Medicine

## 2017-10-19 ENCOUNTER — Inpatient Hospital Stay: Payer: Medicare HMO

## 2017-10-19 ENCOUNTER — Telehealth: Payer: Self-pay | Admitting: Internal Medicine

## 2017-10-19 ENCOUNTER — Encounter: Payer: Self-pay | Admitting: Internal Medicine

## 2017-10-19 ENCOUNTER — Inpatient Hospital Stay: Payer: Medicare HMO | Attending: Oncology

## 2017-10-19 VITALS — BP 129/60 | HR 78 | Temp 98.0°F | Resp 17 | Ht 69.0 in | Wt 161.6 lb

## 2017-10-19 DIAGNOSIS — Z79899 Other long term (current) drug therapy: Secondary | ICD-10-CM | POA: Diagnosis not present

## 2017-10-19 DIAGNOSIS — C3491 Malignant neoplasm of unspecified part of right bronchus or lung: Secondary | ICD-10-CM

## 2017-10-19 DIAGNOSIS — C342 Malignant neoplasm of middle lobe, bronchus or lung: Secondary | ICD-10-CM | POA: Insufficient documentation

## 2017-10-19 DIAGNOSIS — C3490 Malignant neoplasm of unspecified part of unspecified bronchus or lung: Secondary | ICD-10-CM

## 2017-10-19 DIAGNOSIS — Z716 Tobacco abuse counseling: Secondary | ICD-10-CM

## 2017-10-19 DIAGNOSIS — I10 Essential (primary) hypertension: Secondary | ICD-10-CM

## 2017-10-19 DIAGNOSIS — Z5112 Encounter for antineoplastic immunotherapy: Secondary | ICD-10-CM | POA: Diagnosis present

## 2017-10-19 DIAGNOSIS — R5382 Chronic fatigue, unspecified: Secondary | ICD-10-CM

## 2017-10-19 LAB — COMPREHENSIVE METABOLIC PANEL
AST: 16 U/L (ref 5–34)
Albumin: 4.1 g/dL (ref 3.5–5.0)
Alkaline Phosphatase: 80 U/L (ref 40–150)
Anion gap: 11 (ref 3–11)
BILIRUBIN TOTAL: 0.5 mg/dL (ref 0.2–1.2)
BUN: 15 mg/dL (ref 7–26)
CO2: 20 mmol/L — ABNORMAL LOW (ref 22–29)
CREATININE: 1.11 mg/dL (ref 0.70–1.30)
Calcium: 9.2 mg/dL (ref 8.4–10.4)
Chloride: 103 mmol/L (ref 98–109)
GFR calc Af Amer: >60 mL/min (ref >60)
GFR calc non Af Amer: >60 mL/min (ref >60)
Glucose, Bld: 166 mg/dL — ABNORMAL HIGH (ref 70–140)
Potassium: 4.2 mmol/L (ref 3.5–5.1)
Sodium: 134 mmol/L — ABNORMAL LOW (ref 136–145)
TOTAL PROTEIN: 7.3 g/dL (ref 6.4–8.3)

## 2017-10-19 LAB — CBC WITH DIFFERENTIAL/PLATELET
BASOS ABS: 0 10*3/uL (ref 0.0–0.1)
Basophils Relative: 0 %
Eosinophils Absolute: 0.2 10*3/uL (ref 0.0–0.5)
Eosinophils Relative: 3 %
HEMATOCRIT: 38.6 % (ref 38.4–49.9)
Hemoglobin: 13 g/dL (ref 13.0–17.1)
LYMPHS PCT: 14 %
Lymphs Abs: 0.8 10*3/uL — ABNORMAL LOW (ref 0.9–3.3)
MCH: 28.7 pg (ref 27.2–33.4)
MCHC: 33.6 g/dL (ref 32.0–36.0)
MCV: 85.3 fL (ref 79.3–98.0)
MONO ABS: 0.4 10*3/uL (ref 0.1–0.9)
Monocytes Relative: 7 %
NEUTROS ABS: 4.5 10*3/uL (ref 1.5–6.5)
Neutrophils Relative %: 76 %
Platelets: 207 10*3/uL (ref 140–400)
RBC: 4.53 MIL/uL (ref 4.20–5.82)
RDW: 16.3 % — ABNORMAL HIGH (ref 11.0–14.6)
WBC: 6 10*3/uL (ref 4.0–10.3)

## 2017-10-19 MED ORDER — SODIUM CHLORIDE 0.9 % IV SOLN
Freq: Once | INTRAVENOUS | Status: AC
  Administered 2017-10-19: 14:00:00 via INTRAVENOUS

## 2017-10-19 MED ORDER — SODIUM CHLORIDE 0.9 % IV SOLN
10.4000 mg/kg | Freq: Once | INTRAVENOUS | Status: AC
  Administered 2017-10-19: 740 mg via INTRAVENOUS
  Filled 2017-10-19: qty 4.8

## 2017-10-19 NOTE — Progress Notes (Signed)
[facility] [phone]   [phone]  OFFICE PROGRESS NOTE  [name], MD [address] [place] [address]  DIAGNOSIS: Stage IIIA (T1a, N2, M0) non-small cell lung cancer, squamous cell carcinoma presented with right middle lobe pulmonary nodule in addition to ipsilateral hilar and ipsilateral mediastinal lymphadenopathy diagnosed in June 2018.  PRIOR THERAPY:  A course of concurrent chemoradiation with weekly carboplatin for AUC of 2 and paclitaxel 45 MG/M2. Status post 7 cycles.  CURRENT THERAPY: Consolidation immunotherapy with Imfinzi (Durvalumab) 10 MG/KG every 2 weeks.first dose 02/22/2017.  Status post 11 cycles.  INTERVAL HISTORY: Matthew Wilkins 72 y.o. male returns to the clinic today for follow-up visit accompanied by his daughter.  The patient is feeling fine today with no specific complaints except for arthritis in his knees as well as general fatigue and weakness.  He denied having any current chest pain but has shortness of breath with exertion with no cough or hemoptysis.  Unfortunately he continues to smoke and unwilling to quit.  He denied having any nausea, vomiting, diarrhea or constipation.  He denied having any fever or chills.  The patient is here today for evaluation before starting cycle #12 of his treatment.  MEDICAL HISTORY: Past Medical History:  Diagnosis Date  . Adenocarcinoma of right lung, stage 3 ([facility]) 11/17/2016  . Anxiety   . Arthritis   . Coronary artery disease   . Diabetes mellitus   . DVT (deep venous thrombosis) ([facility])   . Encounter for antineoplastic chemotherapy 11/17/2016  . History of radiation therapy 11/28/16-01/06/17   right lung was treated to 60 Gy in 30 fractions  . Hypercholesterolemia   . Hypertension   . PAD (peripheral artery disease) ([facility])   . SOB (shortness of breath)     ALLERGIES:  is allergic to aspirin.  MEDICATIONS:  Current Outpatient Medications  Medication Sig Dispense  Refill  . albuterol (PROVENTIL HFA;VENTOLIN HFA) 108 (90 Base) MCG/ACT inhaler Inhale into the lungs.    [name] apixaban (ELIQUIS) 5 MG TABS tablet Take 1 tablet (5 mg total) by mouth 2 (two) times daily. 60 tablet 0  . feeding supplement, GLUCERNA SHAKE, (GLUCERNA SHAKE) LIQD Take 237 mLs by mouth 3 (three) times daily between meals. 90 Can 0  . Fluticasone-Salmeterol (ADVAIR DISKUS) 250-50 MCG/DOSE AEPB Inhale into the lungs.    . gabapentin (NEURONTIN) 300 MG capsule Take 1 capsule by mouth 3 (three) times daily.    [name] glipiZIDE (GLUCOTROL XL) 5 MG 24 hr tablet Take 10 mg by mouth 2 (two) times daily.     [name] guaiFENesin (MUCINEX) 600 MG 12 hr tablet Take 1 tablet (600 mg total) by mouth 2 (two) times daily. 10 tablet 0  . guaiFENesin-dextromethorphan (ROBITUSSIN DM) 100-10 MG/5ML syrup Take 5 mLs by mouth every 4 (four) hours as needed for cough. 118 mL 0  . levothyroxine (SYNTHROID, LEVOTHROID) 75 MCG tablet TAKE 1 TABLET DAILY BEFORE BREAKFAST. 30 tablet 0  . metFORMIN (GLUCOPHAGE-XR) 500 MG 24 hr tablet Take 2 tablets by mouth 2 (two) times daily.    . pantoprazole (PROTONIX) 40 MG tablet Take 40 mg by mouth 2 (two) times daily.     . rosuvastatin (CRESTOR) 20 MG tablet Take 1 tablet (20 mg total) by mouth daily. 30 tablet 11  . sertraline (ZOLOFT) 25 MG tablet Take 50 mg by mouth daily.     . VENTOLIN HFA 108 (90 Base) MCG/ACT inhaler     .  ZETIA 10 MG tablet Take 10 mg by mouth daily.     No current facility-administered medications for this visit.     SURGICAL HISTORY:  Past Surgical History:  Procedure Laterality Date  . CARDIAC CATHETERIZATION N/A 06/10/2015   Procedure: Left Heart Cath and Coronary Angiography;  Surgeon: Burnell Blanks, MD;  Location: [facility];  Service: Cardiovascular;  Laterality: N/A;  . LOWER EXTREMITY ANGIOGRAM N/A 07/13/2011   Procedure: LOWER EXTREMITY ANGIOGRAM;  Surgeon: Burnell Blanks, MD;  Location: [facility] CATH LAB;  Service:  Cardiovascular;  Laterality: N/A;  . OTHER SURGICAL HISTORY    . OTHER SURGICAL HISTORY    . OTHER SURGICAL HISTORY    . PERCUTANEOUS STENT INTERVENTION Right 07/13/2011   Procedure: PERCUTANEOUS STENT INTERVENTION;  Surgeon: Burnell Blanks, MD;  Location: [facility] CATH LAB;  Service: Cardiovascular;  Laterality: Right;    REVIEW OF SYSTEMS:  A comprehensive review of systems was negative except for: Constitutional: positive for fatigue Respiratory: positive for dyspnea on exertion   PHYSICAL EXAMINATION: General appearance: alert, cooperative, fatigued and no distress Head: Normocephalic, without obvious abnormality, atraumatic Neck: no adenopathy, no JVD, supple, symmetrical, trachea midline and thyroid not enlarged, symmetric, no tenderness/mass/nodules Lymph nodes: Cervical, supraclavicular, and axillary nodes normal. Resp: clear to auscultation bilaterally Back: symmetric, no curvature. ROM normal. No CVA tenderness. Cardio: regular rate and rhythm, S1, S2 normal, no murmur, click, rub or gallop GI: soft, non-tender; bowel sounds normal; no masses,  no organomegaly Extremities: extremities normal, atraumatic, no cyanosis or edema  ECOG PERFORMANCE STATUS: 1 - Symptomatic but completely ambulatory  Blood pressure 129/60, pulse 78, temperature 98 F (36.7 C), temperature source Oral, resp. rate 17, height 5\' 9"  (1.753 m), weight 161 lb 9.6 oz (73.3 kg), SpO2 97 %.  LABORATORY DATA: Lab Results  Component Value Date   WBC 6.0 10/19/2017   HGB 13.0 10/19/2017   HCT 38.6 10/19/2017   MCV 85.3 10/19/2017   PLT 207 10/19/2017      Chemistry      Component Value Date/Time   NA 134 (L) 10/19/2017 1119   NA 136 05/04/2017 1036   K 4.2 10/19/2017 1119   K 4.0 05/04/2017 1036   CL 103 10/19/2017 1119   CO2 20 (L) 10/19/2017 1119   CO2 16 (L) 05/04/2017 1036   BUN 15 10/19/2017 1119   BUN 15.5 05/04/2017 1036   CREATININE 1.11 10/19/2017 1119   CREATININE 1.1 05/04/2017 1036       Component Value Date/Time   CALCIUM 9.2 10/19/2017 1119   CALCIUM 9.6 05/04/2017 1036   ALKPHOS 80 10/19/2017 1119   ALKPHOS 93 05/04/2017 1036   AST 16 10/19/2017 1119   AST 13 05/04/2017 1036   ALT <6 10/19/2017 1119   ALT 7 05/04/2017 1036   BILITOT 0.5 10/19/2017 1119   BILITOT 0.56 05/04/2017 1036       RADIOGRAPHIC STUDIES: No results found.  ASSESSMENT AND PLAN: This is a very pleasant 72 years old white male with a stage IIIa non-small cell lung cancer, squamous cell carcinoma  The patient underwent a course of concurrent chemoradiation with weekly carboplatin and paclitaxel status post 7 cycles. He had partial response after this treatment.  The patient is currently undergoing consolidation immunotherapy was Imfinzi (Durvalumab) status post 11 cycles. The patient continues to tolerate his treatment well with no concerning complaints.  We will proceed with cycle #12 today as scheduled.  I will see him back for follow-up visit  in 2 weeks for evaluation after repeating CT scan of the chest for restaging of his disease. I reminded the patient as usual of the importance of quitting smoking. He was advised to call immediately if he has any concerning symptoms in the interval. The patient voices understanding of current disease status and treatment options and is in agreement with the current care plan. All questions were answered. The patient knows to call the clinic with any problems, questions or concerns. We can certainly see the patient much sooner if necessary.  Disclaimer: This note was dictated with voice recognition software. Similar sounding words can inadvertently be transcribed and may not be corrected upon review.

## 2017-10-19 NOTE — Telephone Encounter (Signed)
Scheduled appt per 6/6 los - gave patient aVS and calender per los.

## 2017-10-19 NOTE — Patient Instructions (Signed)
[facility] Discharge Instructions for Patients Receiving Chemotherapy  Today you received the following chemotherapy agents durvalumab (Imfinzi).  To help prevent nausea and vomiting after your treatment, we encourage you to take your nausea medication as prescribed.    If you develop nausea and vomiting that is not controlled by your nausea medication, call the clinic.   BELOW ARE SYMPTOMS THAT SHOULD BE REPORTED IMMEDIATELY:  *FEVER GREATER THAN 100.5 F  *CHILLS WITH OR WITHOUT FEVER  NAUSEA AND VOMITING THAT IS NOT CONTROLLED WITH YOUR NAUSEA MEDICATION  *UNUSUAL SHORTNESS OF BREATH  *UNUSUAL BRUISING OR BLEEDING  TENDERNESS IN MOUTH AND THROAT WITH OR WITHOUT PRESENCE OF ULCERS  *URINARY PROBLEMS  *BOWEL PROBLEMS  UNUSUAL RASH Items with * indicate a potential emergency and should be followed up as soon as possible.  Feel free to call the clinic should you have any questions or concerns. The clinic phone number is [phone].  Please show the [facility] at check-in to the Emergency Department and triage nurse.

## 2017-10-26 DIAGNOSIS — E1142 Type 2 diabetes mellitus with diabetic polyneuropathy: Secondary | ICD-10-CM | POA: Diagnosis not present

## 2017-10-26 DIAGNOSIS — Z1159 Encounter for screening for other viral diseases: Secondary | ICD-10-CM | POA: Diagnosis not present

## 2017-10-26 DIAGNOSIS — I739 Peripheral vascular disease, unspecified: Secondary | ICD-10-CM | POA: Diagnosis not present

## 2017-10-26 DIAGNOSIS — E039 Hypothyroidism, unspecified: Secondary | ICD-10-CM | POA: Diagnosis not present

## 2017-10-26 DIAGNOSIS — E1165 Type 2 diabetes mellitus with hyperglycemia: Secondary | ICD-10-CM | POA: Diagnosis not present

## 2017-10-26 DIAGNOSIS — E782 Mixed hyperlipidemia: Secondary | ICD-10-CM | POA: Diagnosis not present

## 2017-10-26 DIAGNOSIS — G2581 Restless legs syndrome: Secondary | ICD-10-CM | POA: Diagnosis not present

## 2017-10-26 DIAGNOSIS — I10 Essential (primary) hypertension: Secondary | ICD-10-CM | POA: Diagnosis not present

## 2017-10-26 DIAGNOSIS — J449 Chronic obstructive pulmonary disease, unspecified: Secondary | ICD-10-CM | POA: Diagnosis not present

## 2017-10-26 DIAGNOSIS — R69 Illness, unspecified: Secondary | ICD-10-CM | POA: Diagnosis not present

## 2017-10-26 DIAGNOSIS — Z0000 Encounter for general adult medical examination without abnormal findings: Secondary | ICD-10-CM | POA: Diagnosis not present

## 2017-11-02 ENCOUNTER — Other Ambulatory Visit: Payer: Self-pay

## 2017-11-02 ENCOUNTER — Inpatient Hospital Stay (HOSPITAL_BASED_OUTPATIENT_CLINIC_OR_DEPARTMENT_OTHER): Payer: Medicare HMO | Admitting: Oncology

## 2017-11-02 ENCOUNTER — Telehealth: Payer: Self-pay | Admitting: Oncology

## 2017-11-02 ENCOUNTER — Encounter: Payer: Self-pay | Admitting: Oncology

## 2017-11-02 ENCOUNTER — Inpatient Hospital Stay: Payer: Medicare HMO

## 2017-11-02 ENCOUNTER — Inpatient Hospital Stay: Payer: Medicare HMO | Admitting: Oncology

## 2017-11-02 VITALS — BP 131/57 | HR 72 | Temp 98.2°F | Resp 18 | Ht 69.0 in | Wt 159.7 lb

## 2017-11-02 DIAGNOSIS — C342 Malignant neoplasm of middle lobe, bronchus or lung: Secondary | ICD-10-CM

## 2017-11-02 DIAGNOSIS — C3491 Malignant neoplasm of unspecified part of right bronchus or lung: Secondary | ICD-10-CM

## 2017-11-02 DIAGNOSIS — Z5112 Encounter for antineoplastic immunotherapy: Secondary | ICD-10-CM | POA: Diagnosis not present

## 2017-11-02 DIAGNOSIS — R5382 Chronic fatigue, unspecified: Secondary | ICD-10-CM

## 2017-11-02 LAB — COMPREHENSIVE METABOLIC PANEL
ALT: <6 U/L (ref 0–55)
ANION GAP: 11 (ref 3–11)
AST: 15 U/L (ref 5–34)
Albumin: 4.3 g/dL (ref 3.5–5.0)
Alkaline Phosphatase: 77 U/L (ref 40–150)
BUN: 18 mg/dL (ref 7–26)
CHLORIDE: 107 mmol/L (ref 98–109)
CO2: 20 mmol/L — ABNORMAL LOW (ref 22–29)
Calcium: 9.7 mg/dL (ref 8.4–10.4)
Creatinine, Ser: 1.17 mg/dL (ref 0.70–1.30)
GFR calc Af Amer: >60 mL/min (ref >60)
Glucose, Bld: 118 mg/dL (ref 70–140)
Potassium: 3.9 mmol/L (ref 3.5–5.1)
Sodium: 138 mmol/L (ref 136–145)
Total Bilirubin: 0.3 mg/dL (ref 0.2–1.2)
Total Protein: 7.5 g/dL (ref 6.4–8.3)

## 2017-11-02 LAB — CBC WITH DIFFERENTIAL/PLATELET
BASOS ABS: 0.1 10*3/uL (ref 0.0–0.1)
Basophils Relative: 2 %
EOS PCT: 4 %
Eosinophils Absolute: 0.2 10*3/uL (ref 0.0–0.5)
HEMATOCRIT: 38.3 % — AB (ref 38.4–49.9)
HEMOGLOBIN: 13.2 g/dL (ref 13.0–17.1)
LYMPHS ABS: 0.8 10*3/uL — AB (ref 0.9–3.3)
Lymphocytes Relative: 16 %
MCH: 29.1 pg (ref 27.2–33.4)
MCHC: 34.5 g/dL (ref 32.0–36.0)
MCV: 84.4 fL (ref 79.3–98.0)
Monocytes Absolute: 0.4 10*3/uL (ref 0.1–0.9)
Monocytes Relative: 8 %
NEUTROS PCT: 70 %
Neutro Abs: 3.6 10*3/uL (ref 1.5–6.5)
Platelets: 212 10*3/uL (ref 140–400)
RBC: 4.54 MIL/uL (ref 4.20–5.82)
RDW: 14.8 % — ABNORMAL HIGH (ref 11.0–14.6)
WBC: 5.1 10*3/uL (ref 4.0–10.3)

## 2017-11-02 MED ORDER — SODIUM CHLORIDE 0.9 % IV SOLN
Freq: Once | INTRAVENOUS | Status: AC
  Administered 2017-11-02: 16:00:00 via INTRAVENOUS

## 2017-11-02 MED ORDER — DURVALUMAB 500 MG/10ML IV SOLN
10.4000 mg/kg | Freq: Once | INTRAVENOUS | Status: AC
  Administered 2017-11-02: 740 mg via INTRAVENOUS
  Filled 2017-11-02: qty 10

## 2017-11-02 NOTE — Progress Notes (Signed)
[facility] OFFICE PROGRESS NOTE  Matthew Sacramento, MD [address] [place] [address]  DIAGNOSIS: Stage IIIA (T1a, N2, M0) non-small cell lung cancer, squamous cell carcinoma presented with right middle lobe pulmonary nodule in addition to ipsilateral hilar and ipsilateral mediastinal lymphadenopathy diagnosed in June 2018.  PRIOR THERAPY: A course of concurrent chemoradiation with weekly carboplatin for AUC of 2 and paclitaxel 45 MG/M2. Status post 7 cycles.  CURRENT THERAPY: Consolidation immunotherapy with Imfinzi (Durvalumab) 10 MG/KG every 2 weeks.first dose 02/22/2017.  Status post 12 cycles.  INTERVAL HISTORY: Matthew Wilkins 72 y.o. male returns for routine follow-up visit accompanied by his daughter.  The patient is feeling fine today with no specific complaints.  He denies having any fevers or chills.  Denies chest pain, cough, hemoptysis.  He reports shortness of breath with exertion which is unchanged from baseline.  Denies nausea, vomiting, constipation, diarrhea.  Denies recent weight loss and night sweats.  The patient was supposed to have a restaging CT scan of the chest prior to this visit but he did not schedule the appointment.  The patient is here for evaluation prior to cycle #13 of his treatment.  MEDICAL HISTORY: Past Medical History:  Diagnosis Date  . Adenocarcinoma of right lung, stage 3 ([place]) 11/17/2016  . Anxiety   . Arthritis   . Coronary artery disease   . Diabetes mellitus   . DVT (deep venous thrombosis) ([place])   . Encounter for antineoplastic chemotherapy 11/17/2016  . History of radiation therapy 11/28/16-01/06/17   right lung was treated to 60 Gy in 30 fractions  . Hypercholesterolemia   . Hypertension   . PAD (peripheral artery disease) ([place])   . SOB (shortness of breath)     ALLERGIES:  is allergic to aspirin.  MEDICATIONS:  Current Outpatient Medications  Medication Sig Dispense Refill  . albuterol (PROVENTIL HFA;VENTOLIN HFA)  108 (90 Base) MCG/ACT inhaler Inhale into the lungs.    [name] apixaban (ELIQUIS) 5 MG TABS tablet Take 1 tablet (5 mg total) by mouth 2 (two) times daily. 60 tablet 0  . dicyclomine (BENTYL) 20 MG tablet Take 1 tablet by mouth 3 (three) times daily.    . feeding supplement, GLUCERNA SHAKE, (GLUCERNA SHAKE) LIQD Take 237 mLs by mouth 3 (three) times daily between meals. 90 Can 0  . Fluticasone-Salmeterol (ADVAIR DISKUS) 250-50 MCG/DOSE AEPB Inhale into the lungs.    . gabapentin (NEURONTIN) 300 MG capsule Take 1 capsule by mouth 3 (three) times daily.    [name] glipiZIDE (GLUCOTROL XL) 5 MG 24 hr tablet Take 10 mg by mouth 2 (two) times daily.     [name] guaiFENesin (MUCINEX) 600 MG 12 hr tablet Take 1 tablet (600 mg total) by mouth 2 (two) times daily. 10 tablet 0  . guaiFENesin-dextromethorphan (ROBITUSSIN DM) 100-10 MG/5ML syrup Take 5 mLs by mouth every 4 (four) hours as needed for cough. 118 mL 0  . levothyroxine (SYNTHROID, LEVOTHROID) 75 MCG tablet TAKE 1 TABLET DAILY BEFORE BREAKFAST. 30 tablet 0  . metFORMIN (GLUCOPHAGE-XR) 500 MG 24 hr tablet Take 2 tablets by mouth 2 (two) times daily.    [name] omeprazole (PRILOSEC) 40 MG capsule Take 1 capsule by mouth daily.    . rosuvastatin (CRESTOR) 20 MG tablet Take 1 tablet (20 mg total) by mouth daily. 30 tablet 11  . sertraline (ZOLOFT) 25 MG tablet Take 50 mg by mouth daily.     . VENTOLIN HFA 108 (90 Base) MCG/ACT  inhaler     . ZETIA 10 MG tablet Take 10 mg by mouth daily.     No current facility-administered medications for this visit.    Facility-Administered Medications Ordered in Other Visits  Medication Dose Route Frequency Provider Last Rate Last Dose  . 0.9 %  sodium chloride infusion   Intravenous Once Curt Bears, MD      . durvalumab (IMFINZI) 700 mg in sodium chloride 0.9 % 100 mL chemo infusion  10 mg/kg (Treatment Plan Recorded) Intravenous Once Curt Bears, MD        SURGICAL HISTORY:  Past Surgical History:  Procedure Laterality  Date  . CARDIAC CATHETERIZATION N/A 06/10/2015   Procedure: Left Heart Cath and Coronary Angiography;  Surgeon: Burnell Blanks, MD;  Location: [facility] CV LAB;  Service: Cardiovascular;  Laterality: N/A;  . LOWER EXTREMITY ANGIOGRAM N/A 07/13/2011   Procedure: LOWER EXTREMITY ANGIOGRAM;  Surgeon: Burnell Blanks, MD;  Location: [facility];  Service: Cardiovascular;  Laterality: N/A;  . OTHER SURGICAL HISTORY    . OTHER SURGICAL HISTORY    . OTHER SURGICAL HISTORY    . PERCUTANEOUS STENT INTERVENTION Right 07/13/2011   Procedure: PERCUTANEOUS STENT INTERVENTION;  Surgeon: Burnell Blanks, MD;  Location: [facility] CATH LAB;  Service: Cardiovascular;  Laterality: Right;    REVIEW OF SYSTEMS:   Review of Systems  Constitutional: Negative for appetite change, chills, fatigue, fever and unexpected weight change.  HENT:   Negative for mouth sores, nosebleeds, sore throat and trouble swallowing.   Eyes: Negative for eye problems and icterus.  Respiratory: Negative for cough, hemoptysis, shortness of breath at rest and wheezing.  Positive for shortness of breath with exertion. Cardiovascular: Negative for chest pain and leg swelling.  Gastrointestinal: Negative for abdominal pain, constipation, diarrhea, nausea and vomiting.  Genitourinary: Negative for bladder incontinence, difficulty urinating, dysuria, frequency and hematuria.   Musculoskeletal: Negative for back pain, gait problem, neck pain and neck stiffness.  Skin: Negative for itching and rash.  Neurological: Negative for dizziness, extremity weakness, gait problem, headaches, light-headedness and seizures.  Hematological: Negative for adenopathy. Does not bruise/bleed easily.  Psychiatric/Behavioral: Negative for confusion, depression and sleep disturbance. The patient is not nervous/anxious.     PHYSICAL EXAMINATION:  Blood pressure (!) 131/57, pulse 72, temperature 98.2 F (36.8 C), temperature source Oral, resp. rate  18, height 5\' 9"  (1.753 m), weight 159 lb 11.2 oz (72.4 kg), SpO2 100 %.  ECOG PERFORMANCE STATUS: 1 - Symptomatic but completely ambulatory  Physical Exam  Constitutional: Oriented to person, place, and time and well-developed, well-nourished, and in no distress. No distress.  HENT:  Head: Normocephalic and atraumatic.  Mouth/Throat: Oropharynx is clear and moist. No oropharyngeal exudate.  Eyes: Conjunctivae are normal. Right eye exhibits no discharge. Left eye exhibits no discharge. No scleral icterus.  Neck: Normal range of motion. Neck supple.  Cardiovascular: Normal rate, regular rhythm, normal heart sounds and intact distal pulses.   Pulmonary/Chest: Effort normal and breath sounds normal. No respiratory distress. No wheezes. No rales.  Abdominal: Soft. Bowel sounds are normal. Exhibits no distension and no mass. There is no tenderness.  Musculoskeletal: Normal range of motion. Exhibits no edema.  Lymphadenopathy:    No cervical adenopathy.  Neurological: Alert and oriented to person, place, and time. Exhibits normal muscle tone. Gait normal. Coordination normal.  Skin: Skin is warm and dry. No rash noted. Not diaphoretic. No erythema. No pallor.  Psychiatric: Mood, memory and judgment normal.  Vitals reviewed.  LABORATORY DATA: Lab Results  Component Value Date   WBC 5.1 11/02/2017   HGB 13.2 11/02/2017   HCT 38.3 (L) 11/02/2017   MCV 84.4 11/02/2017   PLT 212 11/02/2017      Chemistry      Component Value Date/Time   NA 138 11/02/2017 1444   NA 136 05/04/2017 1036   K 3.9 11/02/2017 1444   K 4.0 05/04/2017 1036   CL 107 11/02/2017 1444   CO2 20 (L) 11/02/2017 1444   CO2 16 (L) 05/04/2017 1036   BUN 18 11/02/2017 1444   BUN 15.5 05/04/2017 1036   CREATININE 1.17 11/02/2017 1444   CREATININE 1.1 05/04/2017 1036      Component Value Date/Time   CALCIUM 9.7 11/02/2017 1444   CALCIUM 9.6 05/04/2017 1036   ALKPHOS 77 11/02/2017 1444   ALKPHOS 93 05/04/2017 1036    AST 15 11/02/2017 1444   AST 13 05/04/2017 1036   ALT <6 11/02/2017 1444   ALT 7 05/04/2017 1036   BILITOT 0.3 11/02/2017 1444   BILITOT 0.56 05/04/2017 1036       RADIOGRAPHIC STUDIES:  No results found.   ASSESSMENT/PLAN:  Adenocarcinoma of right lung, stage 3 (HCC) This is a very pleasant 72 year old white male with a stage IIIa non-small cell lung cancer, squamous cell carcinoma  The patient underwent a course of concurrent chemoradiation with weekly carboplatin and paclitaxel status post 7 cycles. He had partial response after this treatment.  The patient is currently undergoing consolidation immunotherapy was Imfinzi (Durvalumab) status post 12 cycles. The patient continues to tolerate his treatment well with no concerning complaints. He was supposed to have a restaging CT scan of the chest prior to this visit but he did not schedule the appointment.  Recommend for him to proceed with cycle 13 of his treatment as scheduled today.  The patient's daughter was given the telephone number to radiology to schedule his CT scan.  I advised him to have his CT of the chest prior to his next visit. He will return in 2 weeks for evaluation prior to cycle #14 and to review his restaging CT scan of the chest. I reminded the patient as usual of the importance of quitting smoking.  He was advised to call immediately if he has any concerning symptoms in the interval. The patient voices understanding of current disease status and treatment options and is in agreement with the current care plan. All questions were answered. The patient knows to call the clinic with any problems, questions or concerns. We can certainly see the patient much sooner if necessary.   No orders of the defined types were placed in this encounter.  Mikey Bussing, DNP, AGPCNP-BC, AOCNP 11/02/17

## 2017-11-02 NOTE — Assessment & Plan Note (Signed)
This is a very pleasant 72 year old white male with a stage IIIa non-small cell lung cancer, squamous cell carcinoma  The patient underwent a course of concurrent chemoradiation with weekly carboplatin and paclitaxel status post 7 cycles. He had partial response after this treatment.  The patient is currently undergoing consolidation immunotherapy was Imfinzi (Durvalumab) status post 12 cycles. The patient continues to tolerate his treatment well with no concerning complaints. He was supposed to have a restaging CT scan of the chest prior to this visit but he did not schedule the appointment.  Recommend for him to proceed with cycle 13 of his treatment as scheduled today.  The patient's daughter was given the telephone number to radiology to schedule his CT scan.  I advised him to have his CT of the chest prior to his next visit. He will return in 2 weeks for evaluation prior to cycle #14 and to review his restaging CT scan of the chest. I reminded the patient as usual of the importance of quitting smoking.  He was advised to call immediately if he has any concerning symptoms in the interval. The patient voices understanding of current disease status and treatment options and is in agreement with the current care plan. All questions were answered. The patient knows to call the clinic with any problems, questions or concerns. We can certainly see the patient much sooner if necessary.

## 2017-11-02 NOTE — Telephone Encounter (Signed)
Next 3 cycles already scheduled per 6/20 los.

## 2017-11-02 NOTE — Patient Instructions (Signed)
[facility] Discharge Instructions for Patients Receiving Chemotherapy  Today you received the following chemotherapy agents: Imfinzi.  To help prevent nausea and vomiting after your treatment, we encourage you to take your nausea medication as directed.   If you develop nausea and vomiting that is not controlled by your nausea medication, call the clinic.   BELOW ARE SYMPTOMS THAT SHOULD BE REPORTED IMMEDIATELY:  *FEVER GREATER THAN 100.5 F  *CHILLS WITH OR WITHOUT FEVER  NAUSEA AND VOMITING THAT IS NOT CONTROLLED WITH YOUR NAUSEA MEDICATION  *UNUSUAL SHORTNESS OF BREATH  *UNUSUAL BRUISING OR BLEEDING  TENDERNESS IN MOUTH AND THROAT WITH OR WITHOUT PRESENCE OF ULCERS  *URINARY PROBLEMS  *BOWEL PROBLEMS  UNUSUAL RASH Items with * indicate a potential emergency and should be followed up as soon as possible.  Feel free to call the clinic should you have any questions or concerns. The clinic phone number is [phone].  Please show the CHEMO ALERT CARD at check-in to the Emergency Department and triage nurse.   

## 2017-11-03 LAB — TSH: TSH: 50.993 u[IU]/mL — AB (ref 0.320–4.118)

## 2017-11-07 ENCOUNTER — Other Ambulatory Visit: Payer: Self-pay | Admitting: Oncology

## 2017-11-07 MED ORDER — LEVOTHYROXINE SODIUM 100 MCG PO TABS: 100.0000 ug | ORAL_TABLET | Freq: Every day | ORAL | 1 refills | Status: DC

## 2017-11-07 NOTE — Progress Notes (Signed)
Notified patients daughter since patient is [facility].  Expressed understanding and no further questions at this time.

## 2017-11-09 NOTE — Progress Notes (Signed)
See previous note on 11/07/2017 about notifying the patient about his increase in medication.

## 2017-11-15 ENCOUNTER — Telehealth: Payer: Self-pay | Admitting: Oncology

## 2017-11-15 ENCOUNTER — Ambulatory Visit (HOSPITAL_COMMUNITY)
Admission: RE | Admit: 2017-11-15 | Discharge: 2017-11-15 | Disposition: A | Discharge status: Home / Self Care | Payer: Medicare HMO | Source: Ambulatory Visit | Attending: Internal Medicine | Admitting: Internal Medicine

## 2017-11-15 ENCOUNTER — Inpatient Hospital Stay: Payer: Medicare HMO | Attending: Oncology

## 2017-11-15 ENCOUNTER — Inpatient Hospital Stay (HOSPITAL_BASED_OUTPATIENT_CLINIC_OR_DEPARTMENT_OTHER): Payer: Medicare HMO | Admitting: Oncology

## 2017-11-15 ENCOUNTER — Inpatient Hospital Stay: Payer: Medicare HMO

## 2017-11-15 ENCOUNTER — Encounter: Payer: Self-pay | Admitting: Oncology

## 2017-11-15 ENCOUNTER — Encounter (HOSPITAL_COMMUNITY): Payer: Self-pay

## 2017-11-15 VITALS — BP 135/88 | HR 73 | Temp 98.1°F | Resp 18 | Ht 69.0 in | Wt 161.0 lb

## 2017-11-15 DIAGNOSIS — C3491 Malignant neoplasm of unspecified part of right bronchus or lung: Secondary | ICD-10-CM

## 2017-11-15 DIAGNOSIS — C342 Malignant neoplasm of middle lobe, bronchus or lung: Secondary | ICD-10-CM | POA: Insufficient documentation

## 2017-11-15 DIAGNOSIS — I700 Atherosclerosis of aorta: Secondary | ICD-10-CM | POA: Diagnosis not present

## 2017-11-15 DIAGNOSIS — J439 Emphysema, unspecified: Secondary | ICD-10-CM | POA: Insufficient documentation

## 2017-11-15 DIAGNOSIS — C3490 Malignant neoplasm of unspecified part of unspecified bronchus or lung: Secondary | ICD-10-CM | POA: Insufficient documentation

## 2017-11-15 DIAGNOSIS — Z5112 Encounter for antineoplastic immunotherapy: Secondary | ICD-10-CM | POA: Diagnosis not present

## 2017-11-15 LAB — CBC WITH DIFFERENTIAL/PLATELET
Basophils Absolute: 0.1 10*3/uL (ref 0.0–0.1)
Basophils Relative: 1 %
Eosinophils Absolute: 0.2 10*3/uL (ref 0.0–0.5)
Eosinophils Relative: 3 %
HEMATOCRIT: 39 % (ref 38.4–49.9)
Hemoglobin: 13.1 g/dL (ref 13.0–17.1)
Lymphocytes Relative: 17 %
Lymphs Abs: 1 10*3/uL (ref 0.9–3.3)
MCH: 28.9 pg (ref 27.2–33.4)
MCHC: 33.6 g/dL (ref 32.0–36.0)
MCV: 86 fL (ref 79.3–98.0)
MONO ABS: 0.5 10*3/uL (ref 0.1–0.9)
MONOS PCT: 8 %
NEUTROS ABS: 4.3 10*3/uL (ref 1.5–6.5)
Neutrophils Relative %: 71 %
Platelets: 211 10*3/uL (ref 140–400)
RBC: 4.54 MIL/uL (ref 4.20–5.82)
RDW: 15.6 % — ABNORMAL HIGH (ref 11.0–14.6)
WBC: 6.1 10*3/uL (ref 4.0–10.3)

## 2017-11-15 LAB — COMPREHENSIVE METABOLIC PANEL
ALT: 10 U/L (ref 0–44)
AST: 16 U/L (ref 15–41)
Albumin: 4.4 g/dL (ref 3.5–5.0)
Alkaline Phosphatase: 92 U/L (ref 38–126)
Anion gap: 9 (ref 5–15)
BUN: 13 mg/dL (ref 8–23)
CALCIUM: 9.6 mg/dL (ref 8.9–10.3)
CO2: 22 mmol/L (ref 22–32)
CREATININE: 1.19 mg/dL (ref 0.61–1.24)
Chloride: 103 mmol/L (ref 98–111)
GFR calc non Af Amer: 59 mL/min — ABNORMAL LOW (ref >60)
Glucose, Bld: 158 mg/dL — ABNORMAL HIGH (ref 70–99)
Potassium: 4.1 mmol/L (ref 3.5–5.1)
Sodium: 134 mmol/L — ABNORMAL LOW (ref 135–145)
Total Bilirubin: 0.4 mg/dL (ref 0.3–1.2)
Total Protein: 7.7 g/dL (ref 6.5–8.1)

## 2017-11-15 MED ORDER — SODIUM CHLORIDE 0.9 % IV SOLN
10.4000 mg/kg | Freq: Once | INTRAVENOUS | Status: AC
  Administered 2017-11-15: 740 mg via INTRAVENOUS
  Filled 2017-11-15: qty 10

## 2017-11-15 MED ORDER — SODIUM CHLORIDE 0.9 % IV SOLN
Freq: Once | INTRAVENOUS | Status: AC
  Administered 2017-11-15: 16:00:00 via INTRAVENOUS

## 2017-11-15 MED ORDER — IOHEXOL 300 MG/ML  SOLN
75.0000 mL | Freq: Once | INTRAMUSCULAR | Status: AC | PRN
  Administered 2017-11-15: 75 mL via INTRAVENOUS

## 2017-11-15 NOTE — Progress Notes (Signed)
[facility] OFFICE PROGRESS NOTE  Matthew Sacramento, MD [address] [place] [address]  DIAGNOSIS: Stage IIIA (T1a, N2, M0) non-small cell lung cancer, squamous cell carcinoma presented with right middle lobe pulmonary nodule in addition to ipsilateral hilar and ipsilateral mediastinal lymphadenopathy diagnosed in June 2018.  PRIOR THERAPY: A course of concurrent chemoradiation with weekly carboplatin for AUC of 2 and paclitaxel 45 MG/M2. Status post 7cycles.  CURRENT THERAPY: Consolidation immunotherapy with Imfinzi (Durvalumab) 10 MG/KG every 2 weeks.first dose 02/22/2017. Status post 13cycles.  INTERVAL HISTORY: Matthew Wilkins 72 y.o. male returns for routine follow-up visit accompanied by his daughter.  The patient is feeling fine today and has no specific complaints.  He denies fevers and chills.  Denies chest pain, cough, hemoptysis.  He has baseline shortness of breath with exertion.  Denies nausea, vomiting, constipation, diarrhea.  Denies recent weight loss or night sweats.  He had a restaging CT scan performed earlier today but the radiologist has not yet read the scan.  The patient is here for evaluation prior to cycle 14 of his treatment.  MEDICAL HISTORY: Past Medical History:  Diagnosis Date  . Adenocarcinoma of right lung, stage 3 ([place]) 11/17/2016  . Anxiety   . Arthritis   . Coronary artery disease   . Diabetes mellitus   . DVT (deep venous thrombosis) ([place])   . Encounter for antineoplastic chemotherapy 11/17/2016  . History of radiation therapy 11/28/16-01/06/17   right lung was treated to 60 Gy in 30 fractions  . Hypercholesterolemia   . Hypertension   . PAD (peripheral artery disease) ([place])   . SOB (shortness of breath)     ALLERGIES:  is allergic to aspirin.  MEDICATIONS:  Current Outpatient Medications  Medication Sig Dispense Refill  . albuterol (PROVENTIL HFA;VENTOLIN HFA) 108 (90 Base) MCG/ACT inhaler Inhale into the lungs.    [name]  apixaban (ELIQUIS) 5 MG TABS tablet Take 1 tablet (5 mg total) by mouth 2 (two) times daily. 60 tablet 0  . dicyclomine (BENTYL) 20 MG tablet Take 1 tablet by mouth 3 (three) times daily.    . feeding supplement, GLUCERNA SHAKE, (GLUCERNA SHAKE) LIQD Take 237 mLs by mouth 3 (three) times daily between meals. 90 Can 0  . Fluticasone-Salmeterol (ADVAIR DISKUS) 250-50 MCG/DOSE AEPB Inhale into the lungs.    . gabapentin (NEURONTIN) 300 MG capsule Take 1 capsule by mouth 3 (three) times daily.    [name] glipiZIDE (GLUCOTROL XL) 5 MG 24 hr tablet Take 10 mg by mouth 2 (two) times daily.     [name] guaiFENesin (MUCINEX) 600 MG 12 hr tablet Take 1 tablet (600 mg total) by mouth 2 (two) times daily. 10 tablet 0  . guaiFENesin-dextromethorphan (ROBITUSSIN DM) 100-10 MG/5ML syrup Take 5 mLs by mouth every 4 (four) hours as needed for cough. 118 mL 0  . levothyroxine (SYNTHROID) 100 MCG tablet Take 1 tablet (100 mcg total) by mouth daily before breakfast. 30 tablet 1  . metFORMIN (GLUCOPHAGE-XR) 500 MG 24 hr tablet Take 2 tablets by mouth 2 (two) times daily.    [name] omeprazole (PRILOSEC) 40 MG capsule Take 1 capsule by mouth daily.    . rosuvastatin (CRESTOR) 20 MG tablet Take 1 tablet (20 mg total) by mouth daily. 30 tablet 11  . sertraline (ZOLOFT) 25 MG tablet Take 50 mg by mouth daily.     . VENTOLIN HFA 108 (90 Base) MCG/ACT inhaler     . ZETIA 10 MG tablet  Take 10 mg by mouth daily.     No current facility-administered medications for this visit.    Facility-Administered Medications Ordered in Other Visits  Medication Dose Route Frequency Provider Last Rate Last Dose  . 0.9 %  sodium chloride infusion   Intravenous Once Curt Bears, MD      . durvalumab (IMFINZI) 740 mg in sodium chloride 0.9 % 100 mL chemo infusion  10.4 mg/kg (Treatment Plan Recorded) Intravenous Once Curt Bears, MD        SURGICAL HISTORY:  Past Surgical History:  Procedure Laterality Date  . CARDIAC CATHETERIZATION N/A  06/10/2015   Procedure: Left Heart Cath and Coronary Angiography;  Surgeon: Burnell Blanks, MD;  Location: [facility] CV LAB;  Service: Cardiovascular;  Laterality: N/A;  . LOWER EXTREMITY ANGIOGRAM N/A 07/13/2011   Procedure: LOWER EXTREMITY ANGIOGRAM;  Surgeon: Burnell Blanks, MD;  Location: [facility] CATH LAB;  Service: Cardiovascular;  Laterality: N/A;  . OTHER SURGICAL HISTORY    . OTHER SURGICAL HISTORY    . OTHER SURGICAL HISTORY    . PERCUTANEOUS STENT INTERVENTION Right 07/13/2011   Procedure: PERCUTANEOUS STENT INTERVENTION;  Surgeon: Burnell Blanks, MD;  Location: [facility] C[facility] LAB;  Service: Cardiovascular;  Laterality: Right;    REVIEW OF SYSTEMS:   Review of Systems  Constitutional: Negative for appetite change, chills, fatigue, fever and unexpected weight change.  HENT:   Negative for mouth sores, nosebleeds, sore throat and trouble swallowing.   Eyes: Negative for eye problems and icterus.  Respiratory: Negative for cough, hemoptysis, and wheezing.  He has baseline shortness of breath with exertion. Cardiovascular: Negative for chest pain and leg swelling.  Gastrointestinal: Negative for abdominal pain, constipation, diarrhea, nausea and vomiting.  Genitourinary: Negative for bladder incontinence, difficulty urinating, dysuria, frequency and hematuria.   Musculoskeletal: Negative for back pain, gait problem, neck pain and neck stiffness.  Skin: Negative for itching and rash.  Neurological: Negative for dizziness, extremity weakness, gait problem, headaches, light-headedness and seizures.  Hematological: Negative for adenopathy. Does not bruise/bleed easily.  Psychiatric/Behavioral: Negative for confusion, depression and sleep disturbance. The patient is not nervous/anxious.     PHYSICAL EXAMINATION:  Blood pressure 135/88, pulse 73, temperature 98.1 F (36.7 C), temperature source Oral, resp. rate 18, height 5\' 9"  (1.753 m), weight 161 lb (73 kg), SpO2 100  %.  ECOG PERFORMANCE STATUS: 1 - Symptomatic but completely ambulatory  Physical Exam  Constitutional: Oriented to person, place, and time and well-developed, well-nourished, and in no distress. No distress.  HENT:  Head: Normocephalic and atraumatic.  Mouth/Throat: Oropharynx is clear and moist. No oropharyngeal exudate.  Eyes: Conjunctivae are normal. Right eye exhibits no discharge. Left eye exhibits no discharge. No scleral icterus.  Neck: Normal range of motion. Neck supple.  Cardiovascular: Normal rate, regular rhythm, normal heart sounds and intact distal pulses.   Pulmonary/Chest: Effort normal and breath sounds normal. No respiratory distress. No wheezes. No rales.  Abdominal: Soft. Bowel sounds are normal. Exhibits no distension and no mass. There is no tenderness.  Musculoskeletal: Normal range of motion. Exhibits no edema.  Lymphadenopathy:    No cervical adenopathy.  Neurological: Alert and oriented to person, place, and time. Exhibits normal muscle tone. Gait normal. Coordination normal.  Skin: Skin is warm and dry. No rash noted. Not diaphoretic. No erythema. No pallor.  Psychiatric: Mood, memory and judgment normal.  Vitals reviewed.  LABORATORY DATA: Lab Results  Component Value Date   WBC 6.1 11/15/2017   HGB 13.1  11/15/2017   HCT 39.0 11/15/2017   MCV 86.0 11/15/2017   PLT 211 11/15/2017      Chemistry      Component Value Date/Time   NA 134 (L) 11/15/2017 1501   NA 136 05/04/2017 1036   K 4.1 11/15/2017 1501   K 4.0 05/04/2017 1036   CL 103 11/15/2017 1501   CO2 22 11/15/2017 1501   CO2 16 (L) 05/04/2017 1036   BUN 13 11/15/2017 1501   BUN 15.5 05/04/2017 1036   CREATININE 1.19 11/15/2017 1501   CREATININE 1.1 05/04/2017 1036      Component Value Date/Time   CALCIUM 9.6 11/15/2017 1501   CALCIUM 9.6 05/04/2017 1036   ALKPHOS 92 11/15/2017 1501   ALKPHOS 93 05/04/2017 1036   AST 16 11/15/2017 1501   AST 13 05/04/2017 1036   ALT 10 11/15/2017  1501   ALT 7 05/04/2017 1036   BILITOT 0.4 11/15/2017 1501   BILITOT 0.56 05/04/2017 1036       RADIOGRAPHIC STUDIES:  No results found.   ASSESSMENT/PLAN:  Adenocarcinoma of right lung, stage 3 (HCC) This is a very pleasant 72 year old white male with a stage IIIa non-small cell lung cancer, squamous cell carcinoma  The patient underwent a course of concurrent chemoradiation with weekly carboplatin and paclitaxel status post 7 cycles. He had partial response after this treatment.  The patient is currently undergoing consolidation immunotherapy was Imfinzi (Durvalumab) status post13cycles. The patient continues to tolerate his treatment well with no concerning complaints. He had a restaging CT scan of the chest performed earlier today but the results are not yet available to me. Recommend for him to proceed with cycle 14 of his treatment today as scheduled. He will return in 2 weeks for evaluation prior to cycle #15 and to review the restaging CT scan of the chest.  I reminded the patientas usual of the importance of quitting smoking.  He was advised to call immediately if he has any concerning symptoms in the interval. The patient voices understanding of current disease status and treatment options and is in agreement with the current care plan. All questions were answered. The patient knows to call the clinic with any problems, questions or concerns. We can certainly see the patient much sooner if necessary.   No orders of the defined types were placed in this encounter.  Mikey Bussing, DNP, AGPCNP-BC, AOCNP 11/15/17

## 2017-11-15 NOTE — Assessment & Plan Note (Signed)
This is a very pleasant 72 year old white male with a stage IIIa non-small cell lung cancer, squamous cell carcinoma  The patient underwent a course of concurrent chemoradiation with weekly carboplatin and paclitaxel status post 7 cycles. He had partial response after this treatment.  The patient is currently undergoing consolidation immunotherapy was Imfinzi (Durvalumab) status post13cycles. The patient continues to tolerate his treatment well with no concerning complaints. He had a restaging CT scan of the chest performed earlier today but the results are not yet available to me. Recommend for him to proceed with cycle 14 of his treatment today as scheduled. He will return in 2 weeks for evaluation prior to cycle #15 and to review the restaging CT scan of the chest.  I reminded the patientas usual of the importance of quitting smoking.  He was advised to call immediately if he has any concerning symptoms in the interval. The patient voices understanding of current disease status and treatment options and is in agreement with the current care plan. All questions were answered. The patient knows to call the clinic with any problems, questions or concerns. We can certainly see the patient much sooner if necessary.

## 2017-11-15 NOTE — Patient Instructions (Signed)
[facility] Discharge Instructions for Patients Receiving Chemotherapy  Today you received the following chemotherapy agents: Imfinzi.  To help prevent nausea and vomiting after your treatment, we encourage you to take your nausea medication as directed.   If you develop nausea and vomiting that is not controlled by your nausea medication, call the clinic.   BELOW ARE SYMPTOMS THAT SHOULD BE REPORTED IMMEDIATELY:  *FEVER GREATER THAN 100.5 F  *CHILLS WITH OR WITHOUT FEVER  NAUSEA AND VOMITING THAT IS NOT CONTROLLED WITH YOUR NAUSEA MEDICATION  *UNUSUAL SHORTNESS OF BREATH  *UNUSUAL BRUISING OR BLEEDING  TENDERNESS IN MOUTH AND THROAT WITH OR WITHOUT PRESENCE OF ULCERS  *URINARY PROBLEMS  *BOWEL PROBLEMS  UNUSUAL RASH Items with * indicate a potential emergency and should be followed up as soon as possible.  Feel free to call the clinic should you have any questions or concerns. The clinic phone number is [phone].  Please show the CHEMO ALERT CARD at check-in to the Emergency Department and triage nurse.   

## 2017-11-15 NOTE — Telephone Encounter (Signed)
Scheduled appt per 7/3 los - pt to get an update schedule in the treatment area.

## 2017-11-19 ENCOUNTER — Other Ambulatory Visit: Payer: Self-pay

## 2017-11-19 ENCOUNTER — Emergency Department (HOSPITAL_COMMUNITY): Payer: Medicare HMO

## 2017-11-19 ENCOUNTER — Observation Stay (HOSPITAL_COMMUNITY)
Admission: EM | Admit: 2017-11-19 | Discharge: 2017-11-21 | Disposition: A | Discharge status: Home / Self Care | Payer: Medicare HMO | Attending: Internal Medicine | Admitting: Internal Medicine

## 2017-11-19 ENCOUNTER — Encounter (HOSPITAL_COMMUNITY): Payer: Self-pay | Admitting: Emergency Medicine

## 2017-11-19 DIAGNOSIS — I2510 Atherosclerotic heart disease of native coronary artery without angina pectoris: Secondary | ICD-10-CM | POA: Diagnosis not present

## 2017-11-19 DIAGNOSIS — F17210 Nicotine dependence, cigarettes, uncomplicated: Secondary | ICD-10-CM | POA: Insufficient documentation

## 2017-11-19 DIAGNOSIS — R1011 Right upper quadrant pain: Secondary | ICD-10-CM | POA: Insufficient documentation

## 2017-11-19 DIAGNOSIS — J449 Chronic obstructive pulmonary disease, unspecified: Secondary | ICD-10-CM | POA: Insufficient documentation

## 2017-11-19 DIAGNOSIS — Z8673 Personal history of transient ischemic attack (TIA), and cerebral infarction without residual deficits: Secondary | ICD-10-CM | POA: Diagnosis not present

## 2017-11-19 DIAGNOSIS — E119 Type 2 diabetes mellitus without complications: Secondary | ICD-10-CM | POA: Diagnosis not present

## 2017-11-19 DIAGNOSIS — R51 Headache: Secondary | ICD-10-CM | POA: Diagnosis not present

## 2017-11-19 DIAGNOSIS — I10 Essential (primary) hypertension: Secondary | ICD-10-CM | POA: Insufficient documentation

## 2017-11-19 DIAGNOSIS — Z720 Tobacco use: Secondary | ICD-10-CM | POA: Diagnosis present

## 2017-11-19 DIAGNOSIS — N390 Urinary tract infection, site not specified: Secondary | ICD-10-CM | POA: Diagnosis present

## 2017-11-19 DIAGNOSIS — R109 Unspecified abdominal pain: Secondary | ICD-10-CM | POA: Diagnosis not present

## 2017-11-19 DIAGNOSIS — Z79899 Other long term (current) drug therapy: Secondary | ICD-10-CM | POA: Insufficient documentation

## 2017-11-19 DIAGNOSIS — R4701 Aphasia: Secondary | ICD-10-CM | POA: Diagnosis not present

## 2017-11-19 DIAGNOSIS — E7800 Pure hypercholesterolemia, unspecified: Secondary | ICD-10-CM | POA: Diagnosis present

## 2017-11-19 DIAGNOSIS — G459 Transient cerebral ischemic attack, unspecified: Secondary | ICD-10-CM | POA: Diagnosis present

## 2017-11-19 DIAGNOSIS — C3490 Malignant neoplasm of unspecified part of unspecified bronchus or lung: Secondary | ICD-10-CM | POA: Diagnosis not present

## 2017-11-19 DIAGNOSIS — R4781 Slurred speech: Secondary | ICD-10-CM | POA: Diagnosis not present

## 2017-11-19 DIAGNOSIS — E039 Hypothyroidism, unspecified: Secondary | ICD-10-CM | POA: Diagnosis present

## 2017-11-19 DIAGNOSIS — R69 Illness, unspecified: Secondary | ICD-10-CM | POA: Diagnosis not present

## 2017-11-19 DIAGNOSIS — I739 Peripheral vascular disease, unspecified: Secondary | ICD-10-CM | POA: Diagnosis present

## 2017-11-19 DIAGNOSIS — C3491 Malignant neoplasm of unspecified part of right bronchus or lung: Secondary | ICD-10-CM | POA: Diagnosis present

## 2017-11-19 DIAGNOSIS — Z86711 Personal history of pulmonary embolism: Secondary | ICD-10-CM | POA: Diagnosis present

## 2017-11-19 DIAGNOSIS — R4020 Unspecified coma: Secondary | ICD-10-CM | POA: Diagnosis not present

## 2017-11-19 DIAGNOSIS — G20 Parkinson's disease: Secondary | ICD-10-CM | POA: Diagnosis present

## 2017-11-19 DIAGNOSIS — N179 Acute kidney failure, unspecified: Secondary | ICD-10-CM | POA: Diagnosis present

## 2017-11-19 LAB — I-STAT CHEM 8, ED
BUN: 26 mg/dL — ABNORMAL HIGH (ref 8–23)
CHLORIDE: 104 mmol/L (ref 98–111)
CREATININE: 1.5 mg/dL — AB (ref 0.61–1.24)
Calcium, Ion: 1.15 mmol/L (ref 1.15–1.40)
Glucose, Bld: 257 mg/dL — ABNORMAL HIGH (ref 70–99)
HCT: 39 % (ref 39.0–52.0)
Hemoglobin: 13.3 g/dL (ref 13.0–17.0)
POTASSIUM: 4.1 mmol/L (ref 3.5–5.1)
Sodium: 136 mmol/L (ref 135–145)
TCO2: 19 mmol/L — AB (ref 22–32)

## 2017-11-19 LAB — URINALYSIS, ROUTINE W REFLEX MICROSCOPIC
BILIRUBIN URINE: NEGATIVE
HGB URINE DIPSTICK: NEGATIVE
KETONES UR: 5 mg/dL — AB
Leukocytes, UA: NEGATIVE
Nitrite: NEGATIVE
PH: 5 (ref 5.0–8.0)
PROTEIN: NEGATIVE mg/dL
SPECIFIC GRAVITY, URINE: 1.022 (ref 1.005–1.030)

## 2017-11-19 LAB — COMPREHENSIVE METABOLIC PANEL
ALBUMIN: 4.2 g/dL (ref 3.5–5.0)
ALT: 12 U/L (ref 0–44)
AST: 24 U/L (ref 15–41)
Alkaline Phosphatase: 72 U/L (ref 38–126)
Anion gap: 9 (ref 5–15)
BUN: 27 mg/dL — AB (ref 8–23)
CHLORIDE: 105 mmol/L (ref 98–111)
CO2: 21 mmol/L — AB (ref 22–32)
CREATININE: 1.46 mg/dL — AB (ref 0.61–1.24)
Calcium: 9.3 mg/dL (ref 8.9–10.3)
GFR calc Af Amer: 53 mL/min — ABNORMAL LOW (ref >60)
GFR, EST NON AFRICAN AMERICAN: 46 mL/min — AB (ref >60)
Glucose, Bld: 268 mg/dL — ABNORMAL HIGH (ref 70–99)
Potassium: 4 mmol/L (ref 3.5–5.1)
SODIUM: 135 mmol/L (ref 135–145)
Total Bilirubin: 0.7 mg/dL (ref 0.3–1.2)
Total Protein: 7.5 g/dL (ref 6.5–8.1)

## 2017-11-19 LAB — CBG MONITORING, ED: GLUCOSE-CAPILLARY: 216 mg/dL — AB (ref 70–99)

## 2017-11-19 LAB — CBC
HEMATOCRIT: 39.2 % (ref 39.0–52.0)
Hemoglobin: 13.2 g/dL (ref 13.0–17.0)
MCH: 29.3 pg (ref 26.0–34.0)
MCHC: 33.7 g/dL (ref 30.0–36.0)
MCV: 86.9 fL (ref 78.0–100.0)
PLATELETS: 212 10*3/uL (ref 150–400)
RBC: 4.51 MIL/uL (ref 4.22–5.81)
RDW: 14.7 % (ref 11.5–15.5)
WBC: 7 10*3/uL (ref 4.0–10.5)

## 2017-11-19 LAB — DIFFERENTIAL
BASOS ABS: 0.1 10*3/uL (ref 0.0–0.1)
BASOS PCT: 1 %
EOS ABS: 0.2 10*3/uL (ref 0.0–0.7)
Eosinophils Relative: 2 %
Lymphocytes Relative: 14 %
Lymphs Abs: 1 10*3/uL (ref 0.7–4.0)
Monocytes Absolute: 0.5 10*3/uL (ref 0.1–1.0)
Monocytes Relative: 7 %
NEUTROS ABS: 5.3 10*3/uL (ref 1.7–7.7)
NEUTROS PCT: 76 %

## 2017-11-19 LAB — GLUCOSE, CAPILLARY: Glucose-Capillary: 147 mg/dL — ABNORMAL HIGH (ref 70–99)

## 2017-11-19 LAB — RAPID URINE DRUG SCREEN, HOSP PERFORMED
AMPHETAMINES: NOT DETECTED
Benzodiazepines: NOT DETECTED
Cocaine: NOT DETECTED
Opiates: NOT DETECTED
Tetrahydrocannabinol: NOT DETECTED

## 2017-11-19 LAB — PROTIME-INR
INR: 1.11
Prothrombin Time: 14.2 seconds (ref 11.4–15.2)

## 2017-11-19 LAB — I-STAT TROPONIN, ED: TROPONIN I, POC: 0 ng/mL (ref 0.00–0.08)

## 2017-11-19 LAB — ETHANOL

## 2017-11-19 LAB — APTT: APTT: 40 s — AB (ref 24–36)

## 2017-11-19 MED ORDER — GUAIFENESIN ER 600 MG PO TB12
600.0000 mg | ORAL_TABLET | Freq: Two times a day (BID) | ORAL | Status: DC
  Administered 2017-11-20 – 2017-11-21 (×4): 600 mg via ORAL
  Filled 2017-11-19 (×4): qty 1

## 2017-11-19 MED ORDER — APIXABAN 5 MG PO TABS
5.0000 mg | ORAL_TABLET | Freq: Two times a day (BID) | ORAL | Status: DC
  Administered 2017-11-20 – 2017-11-21 (×4): 5 mg via ORAL
  Filled 2017-11-19 (×4): qty 1

## 2017-11-19 MED ORDER — GUAIFENESIN-DM 100-10 MG/5ML PO SYRP: 5.0000 mL | ORAL_SOLUTION | ORAL | Status: DC | PRN

## 2017-11-19 MED ORDER — ALBUTEROL SULFATE (2.5 MG/3ML) 0.083% IN NEBU: 3.0000 mL | INHALATION_SOLUTION | Freq: Four times a day (QID) | RESPIRATORY_TRACT | Status: DC | PRN

## 2017-11-19 MED ORDER — LEVOTHYROXINE SODIUM 100 MCG PO TABS
100.0000 ug | ORAL_TABLET | Freq: Every day | ORAL | Status: DC
  Administered 2017-11-20 – 2017-11-21 (×2): 100 ug via ORAL
  Filled 2017-11-19 (×2): qty 1

## 2017-11-19 MED ORDER — GLUCERNA SHAKE PO LIQD
237.0000 mL | Freq: Three times a day (TID) | ORAL | Status: DC
  Administered 2017-11-20 – 2017-11-21 (×4): 237 mL via ORAL

## 2017-11-19 MED ORDER — ALPRAZOLAM 0.5 MG PO TABS
0.5000 mg | ORAL_TABLET | Freq: Every evening | ORAL | Status: DC | PRN
  Administered 2017-11-20: 0.5 mg via ORAL
  Filled 2017-11-19: qty 1

## 2017-11-19 MED ORDER — INSULIN ASPART 100 UNIT/ML ~~LOC~~ SOLN
0.0000 [IU] | Freq: Three times a day (TID) | SUBCUTANEOUS | Status: DC
  Administered 2017-11-20 – 2017-11-21 (×2): 2 [IU] via SUBCUTANEOUS
  Administered 2017-11-21: 1 [IU] via SUBCUTANEOUS
  Administered 2017-11-21: 2 [IU] via SUBCUTANEOUS

## 2017-11-19 MED ORDER — ROSUVASTATIN CALCIUM 20 MG PO TABS
20.0000 mg | ORAL_TABLET | Freq: Every day | ORAL | Status: DC
  Administered 2017-11-20 – 2017-11-21 (×2): 20 mg via ORAL
  Filled 2017-11-19 (×2): qty 1

## 2017-11-19 MED ORDER — EZETIMIBE 10 MG PO TABS
10.0000 mg | ORAL_TABLET | Freq: Every day | ORAL | Status: DC
  Administered 2017-11-20 – 2017-11-21 (×2): 10 mg via ORAL
  Filled 2017-11-19 (×2): qty 1

## 2017-11-19 MED ORDER — INSULIN ASPART 100 UNIT/ML ~~LOC~~ SOLN: 0.0000 [IU] | Freq: Every day | SUBCUTANEOUS | Status: DC

## 2017-11-19 MED ORDER — GABAPENTIN 300 MG PO CAPS
300.0000 mg | ORAL_CAPSULE | Freq: Three times a day (TID) | ORAL | Status: DC
  Administered 2017-11-20 – 2017-11-21 (×6): 300 mg via ORAL
  Filled 2017-11-19 (×6): qty 1

## 2017-11-19 MED ORDER — GLIPIZIDE ER 5 MG PO TB24
10.0000 mg | ORAL_TABLET | Freq: Two times a day (BID) | ORAL | Status: DC
  Administered 2017-11-20: 10 mg via ORAL
  Filled 2017-11-19: qty 2

## 2017-11-19 MED ORDER — DICYCLOMINE HCL 20 MG PO TABS
20.0000 mg | ORAL_TABLET | Freq: Three times a day (TID) | ORAL | Status: DC
  Filled 2017-11-19 (×2): qty 1

## 2017-11-19 MED ORDER — METFORMIN HCL ER 500 MG PO TB24: 1000.0000 mg | ORAL_TABLET | Freq: Two times a day (BID) | ORAL | Status: DC

## 2017-11-19 MED ORDER — ACETAMINOPHEN 160 MG/5ML PO SOLN: 650.0000 mg | ORAL | Status: DC | PRN

## 2017-11-19 MED ORDER — SERTRALINE HCL 50 MG PO TABS
50.0000 mg | ORAL_TABLET | Freq: Every day | ORAL | Status: DC
  Administered 2017-11-20 – 2017-11-21 (×2): 50 mg via ORAL
  Filled 2017-11-19 (×2): qty 1

## 2017-11-19 MED ORDER — PANTOPRAZOLE SODIUM 40 MG PO TBEC
40.0000 mg | DELAYED_RELEASE_TABLET | Freq: Every day | ORAL | Status: DC
  Administered 2017-11-20 – 2017-11-21 (×2): 40 mg via ORAL
  Filled 2017-11-19 (×2): qty 1

## 2017-11-19 MED ORDER — MOMETASONE FURO-FORMOTEROL FUM 200-5 MCG/ACT IN AERO
2.0000 | INHALATION_SPRAY | Freq: Two times a day (BID) | RESPIRATORY_TRACT | Status: DC
  Administered 2017-11-20 – 2017-11-21 (×2): 2 via RESPIRATORY_TRACT
  Filled 2017-11-19: qty 8.8

## 2017-11-19 MED ORDER — ACETAMINOPHEN 325 MG PO TABS
650.0000 mg | ORAL_TABLET | ORAL | Status: DC | PRN
  Administered 2017-11-20 (×3): 650 mg via ORAL
  Filled 2017-11-19 (×3): qty 2

## 2017-11-19 MED ORDER — STROKE: EARLY STAGES OF RECOVERY BOOK
Freq: Once | Status: AC
  Administered 2017-11-20
  Filled 2017-11-19: qty 1

## 2017-11-19 MED ORDER — ACETAMINOPHEN 650 MG RE SUPP: 650.0000 mg | RECTAL | Status: DC | PRN

## 2017-11-19 NOTE — ED Notes (Signed)
ED Provider at bedside. 

## 2017-11-19 NOTE — H&P (Signed)
History and Physical    Matthew Wilkins TOI:[id number] DOB: 07-13-1945 DOA: 11/19/2017  PCP: [name], MD   Patient coming from: Home.  I have personally briefly reviewed patient's old medical records in [place]  Chief Complaint: Slurred speech and confusion.  HPI: Matthew Wilkins is a 72 y.o. male with medical history significant of stage III adenocarcinoma of the lung (failed chemo and radiotherapy, now on the immunotherapy), anxiety, arthritis, coronary artery disease, type 2 diabetes, history of DVT, history of pulmonary embolism, hyperlipidemia, hypertension, peripheral arterial disease who is coming to the emergency department due to slurred speech and confusion.  His daughter, who provides most of the history, stated that the patient was in his usual mental state until around noon.  A neighbor was talking to him on the phone earlier.  Then she went to his house and noticed he was not talking properly.  She subsequently called the patient's daughter, who also noticed that he was having slurred speech and confused.  He had trouble finding words.  He had a headache and visual changes as well.  No facial droop, motor weakness or subjective decrease in sensation.  There were no gait abnormalities.  However, by the time that the patient arrived to the emergency department, he was outside of the TPA window.  He denies chest pain, palpitations, dizziness, diaphoresis, abdominal pain, nausea, emesis, diarrhea, constipation, melena or hematochezia.  No dysuria, frequency or hematuria.  Denies pruritus.  ED Course: Initial vital signs temperature 97.6 F, pulse 94, respirations 22, blood pressure 133/69 mmHg and O2 sat 100% on room air.  His urinalysis showed a hazy appearance, glucosuria more than 500 and minimal ketonuria of 5mg /dL .  There was 21-50 RBC and 10-20 WBC per hpf, but there was no hemoglobinuria on urine dipstick.  Urine toxicology was negative.  Rare bacteria on  microscopic.  CBC showed a white count was 7.0, hemoglobin 13.2 g/dL and platelets 212.  PT 14.2 seconds, INR 1.11 and APTT mildly elevated at 40 seconds.  His CMP shows sodium 135, potassium 4.0, chloride 105 and CO2 of 21 mmol /L.  BUN 27, creatinine 1.46 and glucose 268 mg/dL.  LFTs are within normal limits.  Imaging: His chest radiograph showed chronic opacity in the right perihilar region, which seems to be unchanged when compared to recent chest CT.  CT head did not have any acute findings.  However there were mild chronic small vessel ischemic changes in the white matter.  Please see images and full radiology report for further detail..  Review of Systems: As per HPI otherwise 10 point review of systems negative.   Past Medical History:  Diagnosis Date  . Adenocarcinoma of right lung, stage 3 ([place]) 11/17/2016  . Anxiety   . Arthritis   . Coronary artery disease   . Diabetes mellitus   . DVT (deep venous thrombosis) ([place])   . Encounter for antineoplastic chemotherapy 11/17/2016  . History of radiation therapy 11/28/16-01/06/17   right lung was treated to 60 Gy in 30 fractions  . Hypercholesterolemia   . Hypertension   . PAD (peripheral artery disease) ([employer])   . SOB (shortness of breath)     Past Surgical History:  Procedure Laterality Date  . CARDIAC CATHETERIZATION N/A 06/10/2015   Procedure: Left Heart Cath and Coronary Angiography;  Surgeon: Burnell Blanks, MD;  Location: [facility];  Service: Cardiovascular;  Laterality: N/A;  . LOWER EXTREMITY ANGIOGRAM N/A 07/13/2011   Procedure:  LOWER EXTREMITY ANGIOGRAM;  Surgeon: Burnell Blanks, MD;  Location: [facility] CATH LAB;  Service: Cardiovascular;  Laterality: N/A;  . OTHER SURGICAL HISTORY    . OTHER SURGICAL HISTORY    . OTHER SURGICAL HISTORY    . PERCUTANEOUS STENT INTERVENTION Right 07/13/2011   Procedure: PERCUTANEOUS STENT INTERVENTION;  Surgeon: Burnell Blanks, MD;  Location[facility] CATH LAB;  Service:  Cardiovascular;  Laterality: Right;     reports that he has been smoking cigarettes.  He has a 65.00 pack-year smoking history. He has never used smokeless tobacco. He reports that he does not drink alcohol or use drugs.  Allergies  Allergen Reactions  . Aspirin Other (See Comments)    Nose bleeds     Family History  Family history unknown: Yes    Prior to Admission medications   Medication Sig Start Date End Date Taking? Authorizing Provider  albuterol (PROVENTIL HFA;VENTOLIN HFA) 108 (90 Base) MCG/ACT inhaler Inhale 1-2 puffs into the lungs every 6 (six) hours as needed for wheezing or shortness of breath.  04/21/17  Yes [provider]  apixaban (ELIQUIS) 5 MG TABS tablet Take 1 tablet (5 mg total) by mouth 2 (two) times daily. 07/17/17  Yes Dhungel, Nishant, MD  dicyclomine (BENTYL) 20 MG tablet Take 1 tablet by mouth 3 (three) times daily. 10/26/17  Yes [provider]  Fluticasone-Salmeterol (ADVAIR DISKUS) 250-50 MCG/DOSE AEPB Inhale 1 puff into the lungs every 12 (twelve) hours.  04/21/17  Yes [provider]  gabapentin (NEURONTIN) 300 MG capsule Take 1 capsule by mouth 3 (three) times daily. 04/19/16  Yes [provider]  glipiZIDE (GLUCOTROL XL) 5 MG 24 hr tablet Take 10 mg by mouth 2 (two) times daily.  03/11/15  Yes [provider]  guaiFENesin (MUCINEX) 600 MG 12 hr tablet Take 1 tablet (600 mg total) by mouth 2 (two) times daily. 07/17/17 07/17/18 Yes Dhungel, Nishant, MD  guaiFENesin-dextromethorphan (ROBITUSSIN DM) 100-10 MG/5ML syrup Take 5 mLs by mouth every 4 (four) hours as needed for cough. 07/17/17  Yes Dhungel, Nishant, MD  levothyroxine (SYNTHROID) 100 MCG tablet Take 1 tablet (100 mcg total) by mouth daily before breakfast. 11/07/17  Yes Curcio, Roselie Awkward, NP  metFORMIN (GLUCOPHAGE-XR) 500 MG 24 hr tablet Take 2 tablets by mouth 2 (two) times daily. 04/19/16  Yes [provider]  omeprazole (PRILOSEC) 40 MG capsule Take 1  capsule by mouth daily. 10/26/17 10/26/18 Yes [provider]  rosuvastatin (CRESTOR) 20 MG tablet Take 1 tablet (20 mg total) by mouth daily. 10/07/14  Yes Burnell Blanks, MD  sertraline (ZOLOFT) 25 MG tablet Take 50 mg by mouth daily.  10/27/16  Yes [provider]  ZETIA 10 MG tablet Take 10 mg by mouth daily. 04/03/15  Yes [provider]  feeding supplement, GLUCERNA SHAKE, (GLUCERNA SHAKE) LIQD Take 237 mLs by mouth 3 (three) times daily between meals. Patient not taking: Reported on 11/19/2017 07/17/17   Louellen Molder, MD    Physical Exam: Vitals:   11/19/17 2045 11/19/17 2100 11/19/17 2130 11/19/17 2243  BP:  (!) 129/92 139/69 123/69  Pulse: 83 87 88 85  Resp:    17  Temp:    98.1 F (36.7 C)  TempSrc:    Oral  SpO2: 98% 100% 95% 100%  Weight:      Height:        Constitutional: NAD, calm, comfortable Eyes: PERRL, lids and conjunctivae normal ENMT: Mucous membranes are mildly dry.  Posterior  pharynx clear of any exudate or lesions. Neck: normal, supple, no masses, no thyromegaly Respiratory: clear to auscultation bilaterally, no wheezing, no crackles. Normal respiratory effort. No accessory muscle use.  Cardiovascular: Regular rate and rhythm, no murmurs / rubs / gallops. No extremity edema. 2+ pedal pulses. No carotid bruits.  Abdomen: Soft, positive mild RUQ and epigastric tenderness, no guarding/rebound/masses palpated. No hepatosplenomegaly. Bowel sounds positive.  Musculoskeletal: no clubbing / cyanosis. Good ROM, no contractures. Normal muscle tone.  Skin: Hyperkeratosis and hyperpigmented lesions on upper back/torso. Neurologic: CN 2-12 grossly intact. Sensation intact, DTR normal. Strength seems to be a slightly decreased on the right, when compared to left.  Patient is right-handed. Psychiatric: Alert, slow to answer, but oriented x 3, not quite oriented to situation.     Labs on Admission: I have personally reviewed following labs  and imaging studies  CBC: Recent Labs  Lab 11/15/17 1501 11/19/17 1618 11/19/17 1623  WBC 6.1 7.0  --   NEUTROABS 4.3 5.3  --   HGB 13.1 13.2 13.3  HCT 39.0 39.2 39.0  MCV 86.0 86.9  --   PLT 211 212  --    Basic Metabolic Panel: Recent Labs  Lab 11/15/17 1501 11/19/17 1618 11/19/17 1623  NA 134* 135 136  K 4.1 4.0 4.1  CL 103 105 104  CO2 22 21*  --   GLUCOSE 158* 268* 257*  BUN 13 27* 26*  CREATININE 1.19 1.46* 1.50*  CALCIUM 9.6 9.3  --    GFR: Estimated Creatinine Clearance: 43.9 mL/min (A) (by C-G formula based on SCr of 1.5 mg/dL (H)). Liver Function Tests: Recent Labs  Lab 11/15/17 1501 11/19/17 1618  AST 16 24  ALT 10 12  ALKPHOS 92 72  BILITOT 0.4 0.7  PROT 7.7 7.5  ALBUMIN 4.4 4.2   No results for input(s): LIPASE, AMYLASE in the last 168 hours. No results for input(s): AMMONIA in the last 168 hours. Coagulation Profile: Recent Labs  Lab 11/19/17 1618  INR 1.11   Cardiac Enzymes: No results for input(s): CKTOTAL, CKMB, CKMBINDEX, TROPONINI in the last 168 hours. BNP (last 3 results) No results for input(s): PROBNP in the last 8760 hours. HbA1C: No results for input(s): HGBA1C in the last 72 hours. CBG: Recent Labs  Lab 11/19/17 1615 11/19/17 2245  GLUCAP 216* 147*   Lipid Profile: No results for input(s): CHOL, HDL, LDLCALC, TRIG, CHOLHDL, LDLDIRECT in the last 72 hours. Thyroid Function Tests: No results for input(s): TSH, T4TOTAL, FREET4, T3FREE, THYROIDAB in the last 72 hours. Anemia Panel: No results for input(s): VITAMINB12, FOLATE, FERRITIN, TIBC, IRON, RETICCTPCT in the last 72 hours. Urine analysis:    Component Value Date/Time   COLORURINE YELLOW 11/19/2017 1908   APPEARANCEUR HAZY (A) 11/19/2017 1908   LABSPEC 1.022 11/19/2017 1908   PHURINE 5.0 11/19/2017 1908   GLUCOSEU >=500 (A) 11/19/2017 1908   HGBUR NEGATIVE 11/19/2017 1908   BILIRUBINUR NEGATIVE 11/19/2017 1908   KETONESUR 5 (A) 11/19/2017 1908   PROTEINUR  NEGATIVE 11/19/2017 1908   UROBILINOGEN 0.2 01/27/2010 1529   NITRITE NEGATIVE 11/19/2017 1908   LEUKOCYTESUR NEGATIVE 11/19/2017 1908    Radiological Exams on Admission: Dg Chest 2 View  Result Date: 11/19/2017 CLINICAL DATA:  Lung cancer, altered mental status EXAM: CHEST - 2 VIEW COMPARISON:  CT chest 11/15/2017, chest radiograph 07/13/2017 FINDINGS: Normal heart size and pulmonary vascularity. Increased markings in the RIGHT perihilar region into the RIGHT middle and RIGHT lower lobes unchanged from the recent CT.  No acute infiltrate, pleural effusion or pneumothorax. Bones unremarkable IMPRESSION: Chronic opacity in the RIGHT perihilar region extending in the RIGHT middle and RIGHT lower lobes corresponding to recent CT findings, question related to radiation therapy. No definite interval change. Electronically Signed   By: Lavonia Dana M.D.   On: 11/19/2017 20:56   Ct Head Wo Contrast  Result Date: 11/19/2017 CLINICAL DATA:  Altered level of consciousness, unexplained. EXAM: CT HEAD WITHOUT CONTRAST TECHNIQUE: Contiguous axial images were obtained from the base of the skull through the vertex without intravenous contrast. COMPARISON:  Head CT dated 01/28/2010 and brain MRI dated 11/28/2016. FINDINGS: Brain: Mild generalized age related volume loss with commensurate dilatation of the ventricles and sulci. Mild chronic small vessel ischemic changes again noted within the bilateral periventricular white matter regions. There is no mass, hemorrhage, edema or other evidence of acute parenchymal abnormality. No extra-axial hemorrhage. Vascular: There are chronic calcified atherosclerotic changes of the large vessels at the skull base. No unexpected hyperdense vessel. Skull: Normal. Negative for fracture or focal lesion. Sinuses/Orbits: No acute finding. Other: None. IMPRESSION: 1. No acute findings.  No intracranial mass, hemorrhage or edema. 2. Mild chronic small vessel ischemic changes in the white  matter. Electronically Signed   By: Franki Cabot M.D.   On: 11/19/2017 16:57    EKG: Independently reviewed. Vent. rate 97 BPM PR interval * ms QRS duration 89 ms QT/QTc 362/460 ms P-R-T axes 64 69 58 Sinus rhythm Baseline wander in lead(s) V1 Assessment/Plan Principal Problem:   TIA (transient ischemic attack) Observation/telemetry. Frequent neuro checks. Continue apixaban. PT/OT and SLP evaluation. Check hemoglobin A1c and fasting lipids. Check carotid Dopplers. Check echocardiogram. Check MR MRA of brain in the morning. Consult neurology if symptoms recur or MRI positive for CVA.  Active Problems:   AKI (acute kidney injury) ([place]) Baseline creatinine is normal. No apparent reason for change. I will start gentle and time-limited IV hydration. Follow-up renal function and electrolytes.    DM type 2 (diabetes mellitus, type 2) (H[facility]) Carbohydrate modified diet. Continue glipizide XL 10 mg p.o. twice daily. Continue metformin 1000 mg p.o. twice daily. Check hemoglobin A1c tomorrow. CBG monitoring regular insulin sliding scale.    HYPERCHOLESTEROLEMIA  IIA Continue Crestor 20 mg p.o. daily. Continue Zetia 10 mg p.o. daily. Monitor LFTs. Fasting lipid follow-up per primary as an outpatient.    TOBACCO ABUSE Declined nicotine replacement therapy. Staff to provide tobacco cessation information.    PARKINSON'S DISEASE Not on antiparkinsonian agents. Very mild resting tremor.    Essential hypertension Not on medical therapy. Allow permissive hypertension up to 220/120 mmHg.    Coronary atherosclerosis Denies recent chest pain. Continue Eliquis, Crestor and Zetia.    COPD (chronic obstructive pulmonary disease) ([place]) Supplemental oxygen as needed. Bronchodilators as needed.    PAD (peripheral artery disease) ([facility]) No Eliquis and antilipemic agents.    PE (pulmonary thromboembolism) ([place]) Also has history of DVT. On Eliquis.    Adenocarcinoma of right  lung, stage 3 ([facility]) Continue treatment and follow-up per oncology    Hypothyroidism Continue levothyroxine 100 mcg p.o. Daily.    RUQ pain This has been happening for some time. He did not elaborate on the symptoms. I will check RUQ [place] in AM.   DVT prophylaxis: On Eliquis. Code Status: Full code. Family Communication: His daughter was present in the ED room. Disposition Plan: Supervision for TIA work-up. Consults called:  Admission status: Observation/telemetry.   Reubin Milan MD [facility] Pager [phone].  If 7PM-7AM, please contact night-coverage [email or web address] Password [email or web address]  11/19/2017, 11:23 PM

## 2017-11-19 NOTE — ED Notes (Addendum)
Pt refuses stroke swallow screen. Pt found drinking coffee in bed again. Pt seems to be tolerating coffee with no difficulty. Pt continues to refuse to give urine sample. Pt now c/o abdominal pain and that his feet feel numb and tingling. EDP notified of pt's new complaints.

## 2017-11-19 NOTE — ED Notes (Signed)
Pt has trouble making words, but then at other times he speaks clearly. Pt is flailing all over the bed, tossing and turning. Pt follows some commands, but then at other times he won't follow them. Pt's speech is hard to [employer] most of the time. Both of pt's arms and legs seem to be moving equally as he tosses and turns in the bed.

## 2017-11-19 NOTE — ED Provider Notes (Signed)
[facility] EMERGENCY DEPARTMENT Provider Note   CSN: [id number] Arrival date & time: 11/19/17  1558     History   Chief Complaint Chief Complaint  Patient presents with  . Aphasia    HPI QUITMAN NORBERTO is a 72 y.o. male.  Patient brought in by a neighbor.  Neighbor claims that somewhere between 11 and 12 noon she was talking to him on the phone and his speech was normal.  She went over to his house he had abnormal speech did not seem to be able to talk properly.  Patient has a history of lung cancer followed by hematology oncology in the [facility] system.  He has non-small cell lung cancer diagnosed in June 2018 stage IIIa.  Has undergone chemoradiation with minimal effect and is now currently receiving consolidation immunotherapy with Imfizi.  Upon arrival here timing was not exactly certain but either way the patient was beyond 4-1/2 hours.  So code stroke was not called.  Patient did seem to have intermittent speech problems it was not word salad could form words okay but then other times seemed as if he struggles for words.  There was some mention that may be there was a forehead headache and other times he said there was not there was some mention that may be there was some visual changes.  On initial exam there is seem to be bilateral leg weakness but then patient on his own got up and stood up and walked around.  Patient's daughter eventually arrived and at this time upon her arrival which is been a little bit after he been here he already had his head CT which showed no acute changes she thought that he was kind of back to baseline.      Past Medical History:  Diagnosis Date  . Adenocarcinoma of right lung, stage 3 ([place]) 11/17/2016  . Anxiety   . Arthritis   . Coronary artery disease   . Diabetes mellitus   . DVT (deep venous thrombosis) ([place])   . Encounter for antineoplastic chemotherapy 11/17/2016  . History of radiation therapy 11/28/16-01/06/17   right lung was treated to 60 Gy  in 30 fractions  . Hypercholesterolemia   . Hypertension   . PAD (peripheral artery disease) ([place])   . SOB (shortness of breath)     Patient Active Problem List   Diagnosis Date Noted  . Hypothyroidism 07/27/2017  . HCAP (healthcare-associated pneumonia) 07/17/2017  . Diabetes mellitus due to underlying condition, uncontrolled ([place]) 07/17/2017  . Acute respiratory failure with hypoxia ([place]) 07/17/2017  . Encounter for antineoplastic immunotherapy 02/23/2017  . Adenocarcinoma of right lung, stage 3 ([place]) 11/17/2016  . Encounter for antineoplastic chemotherapy 11/17/2016  . Goals of care, counseling/discussion 11/17/2016  . Encounter for smoking cessation counseling 11/17/2016  . Left leg DVT ([place]) 08/31/2016  . Malnutrition of moderate degree 08/30/2016  . Thrombocytopenia ([place]) 08/29/2016  . PE (pulmonary thromboembolism) ([place]) 08/28/2016  . Pulmonary nodule 08/28/2016  . Ureteral stone with hydronephrosis 08/28/2016  . Chest pain 08/28/2016  . CAD (coronary artery disease)   . TIA (transient ischemic attack) 11/27/2011  . Research study patient 08/30/2011  . PAD (peripheral artery disease) ([place]) 06/30/2011  . PVD (peripheral vascular disease) ([place]) 01/10/2011  . COPD (chronic obstructive pulmonary disease) ([place]) 07/06/2010  . TOBACCO ABUSE 11/12/2008  . CAD 09/08/2008  . DM type 2 (diabetes mellitus, type 2) ([place]) 08/26/2008  . HYPERCHOLESTEROLEMIA  IIA 08/26/2008  . ANXIETY 08/26/2008  . PARKINSON'S DISEASE 08/26/2008  .  Essential hypertension 08/26/2008  . ARTHRITIS 08/26/2008  . SHORTNESS OF BREATH 08/26/2008    Past Surgical History:  Procedure Laterality Date  . CARDIAC CATHETERIZATION N/A 06/10/2015   Procedure: Left Heart Cath and Coronary Angiography;  Surgeon: Burnell Blanks, MD;  Location: [facility] CV LAB;  Service: Cardiovascular;  Laterality: N/A;  . LOWER EXTREMITY ANGIOGRAM N/A 07/13/2011   Procedure: LOWER EXTREMITY ANGIOGRAM;  Surgeon:  Burnell Blanks, MD;  Location: [facility] CATH LAB;  Service: Cardiovascular;  Laterality: N/A;  . OTHER SURGICAL HISTORY    . OTHER SURGICAL HISTORY    . OTHER SURGICAL HISTORY    . PERCUTANEOUS STENT INTERVENTION Right 07/13/2011   Procedure: PERCUTANEOUS STENT INTERVENTION;  Surgeon: Burnell Blanks, MD;  Location: [facility] CATH LAB;  Service: Cardiovascular;  Laterality: Right;        Home Medications    Prior to Admission medications   Medication Sig Start Date End Date Taking? Authorizing Provider  albuterol (PROVENTIL HFA;VENTOLIN HFA) 108 (90 Base) MCG/ACT inhaler Inhale 1-2 puffs into the lungs every 6 (six) hours as needed for wheezing or shortness of breath.  04/21/17  Yes [provider]  apixaban (ELIQUIS) 5 MG TABS tablet Take 1 tablet (5 mg total) by mouth 2 (two) times daily. 07/17/17  Yes Dhungel, Nishant, MD  dicyclomine (BENTYL) 20 MG tablet Take 1 tablet by mouth 3 (three) times daily. 10/26/17  Yes [provider]  Fluticasone-Salmeterol (ADVAIR DISKUS) 250-50 MCG/DOSE AEPB Inhale 1 puff into the lungs every 12 (twelve) hours.  04/21/17  Yes [provider]  gabapentin (NEURONTIN) 300 MG capsule Take 1 capsule by mouth 3 (three) times daily. 04/19/16  Yes [provider]  glipiZIDE (GLUCOTROL XL) 5 MG 24 hr tablet Take 10 mg by mouth 2 (two) times daily.  03/11/15  Yes [provider]  guaiFENesin (MUCINEX) 600 MG 12 hr tablet Take 1 tablet (600 mg total) by mouth 2 (two) times daily. 07/17/17 07/17/18 Yes Dhungel, Nishant, MD  guaiFENesin-dextromethorphan (ROBITUSSIN DM) 100-10 MG/5ML syrup Take 5 mLs by mouth every 4 (four) hours as needed for cough. 07/17/17  Yes Dhungel, Nishant, MD  levothyroxine (SYNTHROID) 100 MCG tablet Take 1 tablet (100 mcg total) by mouth daily before breakfast. 11/07/17  Yes Curcio, Roselie Awkward, NP  metFORMIN (GLUCOPHAGE-XR) 500 MG 24 hr tablet Take 2 tablets by mouth 2 (two) times daily. 04/19/16  Yes [provider]  omeprazole (PRILOSEC) 40 MG capsule Take 1 capsule by mouth daily. 10/26/17 10/26/18 Yes [provider]  rosuvastatin (CRESTOR) 20 MG tablet Take 1 tablet (20 mg total) by mouth daily. 10/07/14  Yes Burnell Blanks, MD  sertraline (ZOLOFT) 25 MG tablet Take 50 mg by mouth daily.  10/27/16  Yes [provider]  ZETIA 10 MG tablet Take 10 mg by mouth daily. 04/03/15  Yes [provider]  feeding supplement, GLUCERNA SHAKE, (GLUCERNA SHAKE) LIQD Take 237 mLs by mouth 3 (three) times daily between meals. Patient not taking: Reported on 11/19/2017 07/17/17   Louellen Molder, MD    Family History Family History  Family history unknown: Yes    Social History Social History   Tobacco Use  . Smoking status: Current Every Day Smoker    Packs/day: 1.00    Years: 65.00    Pack years: 65.00    Types: Cigarettes  . Smokeless tobacco: Never Used  Substance Use Topics  . Alcohol use: No  . Drug use: No     Allergies  Aspirin   Review of Systems Review of Systems  Constitutional: Negative for fever.  HENT: Negative for congestion.   Eyes: Positive for visual disturbance.  Respiratory: Negative for shortness of breath.   Cardiovascular: Negative for chest pain.  Gastrointestinal: Positive for abdominal pain. Negative for nausea and vomiting.  Genitourinary: Negative for dysuria.  Musculoskeletal: Negative for back pain.  Allergic/Immunologic: Positive for immunocompromised state.  Neurological: Positive for speech difficulty and headaches. Negative for dizziness, facial asymmetry and weakness.  Hematological: Does not bruise/bleed easily.  Psychiatric/Behavioral: Negative for confusion.     Physical Exam Updated Vital Signs BP (!) 129/92   Pulse 87   Temp 97.6 F (36.4 C) (Oral)   Resp (!) 22   Ht 1.753 m (5\' 9" )   Wt 73 kg (161 lb)   SpO2 100%   BMI 23.78 kg/m   Physical Exam  Constitutional: He appears well-developed and  well-nourished.  HENT:  Head: Normocephalic and atraumatic.  Mouth/Throat: Oropharynx is clear and moist.  Eyes: Pupils are equal, round, and reactive to light. Conjunctivae and EOM are normal.  Neck: Normal range of motion. Neck supple.  Cardiovascular: Normal rate and regular rhythm.  Pulmonary/Chest: Effort normal and breath sounds normal.  Abdominal: Soft. Bowel sounds are normal. There is no tenderness.  Musculoskeletal: Normal range of motion. He exhibits no edema.  Neurological: He is alert. He exhibits normal muscle tone. Coordination normal.  Cabbell speech problem.  Skin: Skin is warm.  Nursing note and vitals reviewed.    ED Treatments / Results  Labs (all labs ordered are listed, but only abnormal results are displayed) Labs Reviewed  APTT - Abnormal; Notable for the following components:      Result Value   aPTT 40 (*)    All other components within normal limits  COMPREHENSIVE METABOLIC PANEL - Abnormal; Notable for the following components:   CO2 21 (*)    Glucose, Bld 268 (*)    BUN 27 (*)    Creatinine, Ser 1.46 (*)    GFR calc non Af Amer 46 (*)    GFR calc Af Amer 53 (*)    All other components within normal limits  RAPID URINE DRUG SCREEN, HOSP PERFORMED - Abnormal; Notable for the following components:   Barbiturates   (*)    Value: Result not available. Reagent lot number recalled by manufacturer.   All other components within normal limits  URINALYSIS, ROUTINE W REFLEX MICROSCOPIC - Abnormal; Notable for the following components:   APPearance HAZY (*)    Glucose, UA >=500 (*)    Ketones, ur 5 (*)    Bacteria, UA RARE (*)    All other components within normal limits  CBG MONITORING, ED - Abnormal; Notable for the following components:   Glucose-Capillary 216 (*)    All other components within normal limits  I-STAT CHEM 8, ED - Abnormal; Notable for the following components:   BUN 26 (*)    Creatinine, Ser 1.50 (*)    Glucose, Bld 257 (*)    TCO2  19 (*)    All other components within normal limits  PROTIME-INR  CBC  DIFFERENTIAL  ETHANOL  I-STAT TROPONIN, ED  CBG MONITORING, ED    EKG None   ED ECG REPORT   Date: 11/19/2017  Rate: 97  Rhythm: normal sinus rhythm  QRS Axis: normal  Intervals: normal  ST/T Wave abnormalities: normal  Conduction Disutrbances:none  Narrative Interpretation:   Old EKG Reviewed: none available  I  have personally reviewed the EKG tracing and agree with the computerized printout as noted.  Radiology Dg Chest 2 View  Result Date: 11/19/2017 CLINICAL DATA:  Lung cancer, altered mental status EXAM: CHEST - 2 VIEW COMPARISON:  CT chest 11/15/2017, chest radiograph 07/13/2017 FINDINGS: Normal heart size and pulmonary vascularity. Increased markings in the RIGHT perihilar region into the RIGHT middle and RIGHT lower lobes unchanged from the recent CT. No acute infiltrate, pleural effusion or pneumothorax. Bones unremarkable IMPRESSION: Chronic opacity in the RIGHT perihilar region extending in the RIGHT middle and RIGHT lower lobes corresponding to recent CT findings, question related to radiation therapy. No definite interval change. Electronically Signed   By: Lavonia Dana M.D.   On: 11/19/2017 20:56   Ct Head Wo Contrast  Result Date: 11/19/2017 CLINICAL DATA:  Altered level of consciousness, unexplained. EXAM: CT HEAD WITHOUT CONTRAST TECHNIQUE: Contiguous axial images were obtained from the base of the skull through the vertex without intravenous contrast. COMPARISON:  Head CT dated 01/28/2010 and brain MRI dated 11/28/2016. FINDINGS: Brain: Mild generalized age related volume loss with commensurate dilatation of the ventricles and sulci. Mild chronic small vessel ischemic changes again noted within the bilateral periventricular white matter regions. There is no mass, hemorrhage, edema or other evidence of acute parenchymal abnormality. No extra-axial hemorrhage. Vascular: There are chronic calcified  atherosclerotic changes of the large vessels at the skull base. No unexpected hyperdense vessel. Skull: Normal. Negative for fracture or focal lesion. Sinuses/Orbits: No acute finding. Other: None. IMPRESSION: 1. No acute findings.  No intracranial mass, hemorrhage or edema. 2. Mild chronic small vessel ischemic changes in the white matter. Electronically Signed   By: Franki Cabot M.D.   On: 11/19/2017 16:57    Procedures Procedures (including critical care time)  CRITICAL CARE Performed by: Fredia Sorrow Total critical care time: 30 minutes Critical care time was exclusive of separately billable procedures and treating other patients. Critical care was necessary to treat or prevent imminent or life-threatening deterioration. Critical care was time spent personally by me on the following activities: development of treatment plan with patient and/or surrogate as well as nursing, discussions with consultants, evaluation of patient's response to treatment, examination of patient, obtaining history from patient or surrogate, ordering and performing treatments and interventions, ordering and review of laboratory studies, ordering and review of radiographic studies, pulse oximetry and re-evaluation of patient's condition.   Medications Ordered in ED Medications - No data to display   Initial Impression / Assessment and Plan / ED Course  I have reviewed the triage vital signs and the nursing notes.  Pertinent labs & imaging results that were available during my care of the patient were reviewed by me and considered in my medical decision making (see chart for details).     Patient's chest x-ray without any real changes from recent CT of the chest regarding the lung cancer.  CT head negative for any acute findings.  MRI not available.  Patient although wanting to leave early on once his daughter arrived was willing to stay.  Daughter felt that his speech was back to baseline she did not witness  any of the abnormal speech that the neighbor had witness.  Patient may have had some mental confusion as well.  Cannot completely rule out TIA.  Discussed with hospitalist they will admit to TIA work-up and get MRI in the morning.  Patient initially had some concern for motor weakness and speech abnormality but the timing certainly did not fit into  TPA.  There was some question of whether he fit in the [place] protocol but did not seem to fit into that.  So code stroke was not called however stroke work-up proceeded.  Final Clinical Impressions(s) / ED Diagnoses   Final diagnoses:  Aphasia    ED Discharge Orders    None       Fredia Sorrow, MD 11/19/17 2214

## 2017-11-19 NOTE — Progress Notes (Signed)
PT and HPOA stated that he is a DNR. MD notified. Continue to await response.

## 2017-11-19 NOTE — ED Triage Notes (Signed)
Neighbor brings patient in, states she last talk to him last night and he was normal.  Today he has called her several times repeating himself. Pt talking about random things in triage.

## 2017-11-19 NOTE — Progress Notes (Signed)
PT passed swallowing test.

## 2017-11-19 NOTE — ED Notes (Signed)
Pt asked to give urine sample. Pt states very clearly, "I ain't giving you any and you ain't taking none from me". EDP notified and said ok to hold off on obtaining urine sample at this time.

## 2017-11-19 NOTE — ED Notes (Addendum)
Pt's friend called RN to bedside saying pt is "misbehaving". RN went to room and pt had removed his monitor cords, gown, getting dressed and drinking coffee. Pt was standing with a steady gait. EDP notified and came to bedside to speak with pt. Pt states, "I want this IV removed and I'm getting out of here". IV removed. Pt then states, "I'm laying down for a few minutes and then I'm gonna leave". Daughter just came to bedside and trying to convince pt to stay to get worked up.

## 2017-11-19 NOTE — ED Notes (Signed)
Patient transported to CT 

## 2017-11-19 NOTE — Progress Notes (Signed)
PT refusing neuros and IV insertion at this time. PT states he just wants to nap and rest.

## 2017-11-20 ENCOUNTER — Observation Stay (HOSPITAL_COMMUNITY): Payer: Medicare HMO

## 2017-11-20 ENCOUNTER — Observation Stay (HOSPITAL_BASED_OUTPATIENT_CLINIC_OR_DEPARTMENT_OTHER): Payer: Medicare HMO

## 2017-11-20 DIAGNOSIS — I5030 Unspecified diastolic (congestive) heart failure: Secondary | ICD-10-CM

## 2017-11-20 DIAGNOSIS — N179 Acute kidney failure, unspecified: Secondary | ICD-10-CM | POA: Diagnosis present

## 2017-11-20 DIAGNOSIS — E039 Hypothyroidism, unspecified: Secondary | ICD-10-CM | POA: Diagnosis not present

## 2017-11-20 DIAGNOSIS — F17200 Nicotine dependence, unspecified, uncomplicated: Secondary | ICD-10-CM | POA: Diagnosis not present

## 2017-11-20 DIAGNOSIS — R4701 Aphasia: Secondary | ICD-10-CM

## 2017-11-20 DIAGNOSIS — N390 Urinary tract infection, site not specified: Secondary | ICD-10-CM | POA: Diagnosis not present

## 2017-11-20 DIAGNOSIS — R1011 Right upper quadrant pain: Secondary | ICD-10-CM | POA: Diagnosis not present

## 2017-11-20 DIAGNOSIS — I10 Essential (primary) hypertension: Secondary | ICD-10-CM | POA: Diagnosis not present

## 2017-11-20 DIAGNOSIS — G459 Transient cerebral ischemic attack, unspecified: Secondary | ICD-10-CM | POA: Diagnosis not present

## 2017-11-20 DIAGNOSIS — J449 Chronic obstructive pulmonary disease, unspecified: Secondary | ICD-10-CM | POA: Diagnosis not present

## 2017-11-20 DIAGNOSIS — R4781 Slurred speech: Secondary | ICD-10-CM | POA: Diagnosis not present

## 2017-11-20 DIAGNOSIS — R69 Illness, unspecified: Secondary | ICD-10-CM | POA: Diagnosis not present

## 2017-11-20 DIAGNOSIS — I6523 Occlusion and stenosis of bilateral carotid arteries: Secondary | ICD-10-CM | POA: Diagnosis not present

## 2017-11-20 DIAGNOSIS — E118 Type 2 diabetes mellitus with unspecified complications: Secondary | ICD-10-CM | POA: Diagnosis not present

## 2017-11-20 LAB — LIPID PANEL
CHOLESTEROL: 220 mg/dL — AB (ref 0–200)
HDL: 43 mg/dL (ref >40)
LDL Cholesterol: 146 mg/dL — ABNORMAL HIGH (ref 0–99)
Total CHOL/HDL Ratio: 5.1 RATIO
Triglycerides: 157 mg/dL — ABNORMAL HIGH (ref <150)
VLDL: 31 mg/dL (ref 0–40)

## 2017-11-20 LAB — GLUCOSE, CAPILLARY
GLUCOSE-CAPILLARY: 119 mg/dL — AB (ref 70–99)
GLUCOSE-CAPILLARY: 201 mg/dL — AB (ref 70–99)
Glucose-Capillary: 108 mg/dL — ABNORMAL HIGH (ref 70–99)
Glucose-Capillary: 151 mg/dL — ABNORMAL HIGH (ref 70–99)

## 2017-11-20 LAB — ECHOCARDIOGRAM COMPLETE
Height: 69 in
Weight: 2576 oz

## 2017-11-20 LAB — HEMOGLOBIN A1C
Hgb A1c MFr Bld: 8 % — ABNORMAL HIGH (ref 4.8–5.6)
Mean Plasma Glucose: 182.9 mg/dL

## 2017-11-20 MED ORDER — SODIUM CHLORIDE 0.9 % IV SOLN
1.0000 g | INTRAVENOUS | Status: DC
  Administered 2017-11-20 – 2017-11-21 (×2): 1 g via INTRAVENOUS
  Filled 2017-11-20: qty 10
  Filled 2017-11-20 (×2): qty 1

## 2017-11-20 MED ORDER — SODIUM CHLORIDE 0.9 % IV SOLN
INTRAVENOUS | Status: DC
  Administered 2017-11-20 – 2017-11-21 (×2): via INTRAVENOUS

## 2017-11-20 MED ORDER — TRAMADOL HCL 50 MG PO TABS
50.0000 mg | ORAL_TABLET | Freq: Four times a day (QID) | ORAL | Status: DC | PRN
Start: 2017-11-20 — End: 2017-11-21
  Administered 2017-11-20: 50 mg via ORAL
  Filled 2017-11-20: qty 2

## 2017-11-20 MED ORDER — IBUPROFEN 100 MG/5ML PO SUSP
600.0000 mg | Freq: Four times a day (QID) | ORAL | Status: DC | PRN
Start: 2017-11-20 — End: 2017-11-21
  Administered 2017-11-20 – 2017-11-21 (×3): 600 mg via ORAL
  Filled 2017-11-20 (×3): qty 30

## 2017-11-20 MED ORDER — DICYCLOMINE HCL 10 MG PO CAPS
20.0000 mg | ORAL_CAPSULE | Freq: Three times a day (TID) | ORAL | Status: DC
  Administered 2017-11-20 – 2017-11-21 (×5): 20 mg via ORAL
  Filled 2017-11-20 (×6): qty 2

## 2017-11-20 NOTE — Progress Notes (Signed)
11/20/2017 1:31 PM  I spoke with patient's daughter Mateo Flow [phone] with update per patient's request.    Murvin Natal MD

## 2017-11-20 NOTE — Care Management (Signed)
Patient declines home health.

## 2017-11-20 NOTE — Evaluation (Signed)
Physical Therapy Evaluation Patient Details Name: Matthew Wilkins MRN: [id number] DOB: November 12, 1945 Today's Date: 11/20/2017   History of Present Illness   Matthew Wilkins is a 72 y.o. male with medical history significant of stage III adenocarcinoma of the lung (failed chemo and radiotherapy, now on the immunotherapy), anxiety, arthritis, coronary artery disease, type 2 diabetes, history of DVT, history of pulmonary embolism, hyperlipidemia, hypertension, peripheral arterial disease who is coming to the emergency department due to slurred speech and confusion.    Clinical Impression  Patient functioning near baseline for functional mobility and gait other than having to use side rails in hallway once fatigued.  Patient ambulated around nursing stations and stated that was more than he usually walks at home and tolerated sitting up at bedside after therapy.  Patient will benefit from continued physical therapy in hospital and recommended venue below to increase strength, balance, endurance for safe ADLs and gait.     Follow Up Recommendations Home health PT;Other (comment)(Patient declines HHPT)    Equipment Recommendations  None recommended by PT    Recommendations for Other Services       Precautions / Restrictions Precautions Precautions: Fall Restrictions Weight Bearing Restrictions: No      Mobility  Bed Mobility Overal bed mobility: Modified Independent                Transfers Overall transfer level: Modified independent Equipment used: None                Ambulation/Gait Ambulation/Gait assistance: Supervision Gait Distance (Feet): 80 Feet Assistive device: None Gait Pattern/deviations: Decreased step length - right;Decreased step length - left;Decreased stride length Gait velocity: slow   General Gait Details: demonstrates slow cadence with short step/stride length which is baseline per patient, no loss of balance, but has to lean on siderails once  fatigued  Stairs            Wheelchair Mobility    Modified Rankin (Stroke Patients Only)       Balance Overall balance assessment: No apparent balance deficits (not formally assessed)                                           Pertinent Vitals/Pain Pain Assessment: No/denies pain    Home Living Family/patient expects to be discharged to:: Private residence Living Arrangements: Alone("per patient") Available Help at Discharge: Family Type of Home: House Home Access: Level entry     Home Layout: One level Home Equipment: Cane - single point;Walker - 4 wheels;Wheelchair - [employer];Hospital bed;Shower seat      Prior Function Level of Independence: Independent with assistive device(s)         Comments: Hydrographic surveyor with SPC PRN     Hand Dominance        Extremity/Trunk Assessment   Upper Extremity Assessment Upper Extremity Assessment: Defer to OT evaluation    Lower Extremity Assessment Lower Extremity Assessment: Overall WFL for tasks assessed    Cervical / Trunk Assessment Cervical / Trunk Assessment: Normal  Communication   Communication: No difficulties  Cognition Arousal/Alertness: Awake/alert Behavior During Therapy: WFL for tasks assessed/performed Overall Cognitive Status: Within Functional Limits for tasks assessed  General Comments      Exercises     Assessment/Plan    PT Assessment Patient needs continued PT services  PT Problem List Decreased activity tolerance;Decreased balance;Decreased mobility       PT Treatment Interventions Gait training;Stair training;Functional mobility training;Therapeutic activities;Therapeutic exercise;Patient/family education    PT Goals (Current goals can be found in the Care Plan section)  Acute Rehab PT Goals Patient Stated Goal: return home PT Goal Formulation: With patient Time For Goal  Achievement: 11/24/17 Potential to Achieve Goals: Good    Frequency 7X/week   Barriers to discharge        Co-evaluation               AM-PAC PT "6 Clicks" Daily Activity  Outcome Measure Difficulty turning over in bed (including adjusting bedclothes, sheets and blankets)?: None Difficulty moving from lying on back to sitting on the side of the bed? : None Difficulty sitting down on and standing up from a chair with arms (e.g., wheelchair, bedside commode, etc,.)?: None Help needed moving to and from a bed to chair (including a wheelchair)?: A Little Help needed walking in hospital room?: A Little Help needed climbing 3-5 steps with a railing? : A Little 6 Click Score: 21    End of Session   Activity Tolerance: Patient tolerated treatment well;Patient limited by fatigue Patient left: in bed;with call bell/phone within reach;with bed alarm set(seated at bedside) Nurse Communication: Mobility status PT Visit Diagnosis: Unsteadiness on feet (R26.81);Other abnormalities of gait and mobility (R26.89);Muscle weakness (generalized) (M62.81)    Time: 0315-9458 PT Time Calculation (min) (ACUTE ONLY): 21 min   Charges:   PT Evaluation $PT Eval Low Complexity: 1 Low PT Treatments $Therapeutic Activity: 8-22 mins   PT G Codes:        12:19 PM, December 18, 2017 Lonell Grandchild, MPT Physical Therapist with [facility] [phone] office [phone] mobile phone

## 2017-11-20 NOTE — Progress Notes (Signed)
Was transported for MRI during assessment time of 0843.

## 2017-11-20 NOTE — Progress Notes (Signed)
*  PRELIMINARY RESULTS* Echocardiogram 2D Echocardiogram has been performed.  Leavy Cella 11/20/2017, 11:30 AM

## 2017-11-20 NOTE — Plan of Care (Signed)
  Problem: Acute Rehab PT Goals(only PT should resolve) Goal: Pt Will Transfer Bed To Chair/Chair To Bed Outcome: Progressing Flowsheets (Taken 11/20/2017 1221) Pt will Transfer Bed to Chair/Chair to Bed: Independently Goal: Pt Will Ambulate Outcome: Progressing Flowsheets (Taken 11/20/2017 1221) Pt will Ambulate: 75 feet;with modified independence   12:22 PM, 11/20/17 Lonell Grandchild, MPT Physical Therapist with [facility] [phone] office [phone] mobile phone

## 2017-11-20 NOTE — Progress Notes (Signed)
OT Cancellation Note  Patient Details Name: Matthew Wilkins MRN: [id number] DOB: Oct 10, 1945   Cancelled Treatment:    Reason Eval/Treat Not Completed: Patient at procedure or test/ unavailable. Based on patient's recent hospitalization notes it appears that he has returned to baseline with some slight weakness noted on right side. Will re-attempt evaluation when able.   Ailene Ravel, OTR/L,CBIS  [phone]  11/20/2017, 9:48 AM

## 2017-11-20 NOTE — Progress Notes (Signed)
C/O headache and left eye pain which says has had since yesterday. Says vision ok. Tylenol ineffective.  Contacted Dr. Wynetta Emery and received order for motrin

## 2017-11-20 NOTE — Progress Notes (Addendum)
PROGRESS NOTE    Matthew Wilkins  OVF:[id number]  DOB: [birth year]  DOA: 11/19/2017 PCP: [name], MD   Brief Admission Hx:  HPI: Matthew Wilkins is a 72 y.o. male with medical history significant of stage III adenocarcinoma of the lung (failed chemo and radiotherapy, now on the immunotherapy), anxiety, arthritis, coronary artery disease, type 2 diabetes, history of DVT, history of pulmonary embolism, hyperlipidemia, hypertension, peripheral arterial disease who is coming to the emergency department due to slurred speech and confusion.  His daughter, who provides most of the history, stated that the patient was in his usual mental state until around noon.  A neighbor was talking to him on the phone earlier.  Then she went to his house and noticed he was not talking properly.  She subsequently called the patient's daughter, who also noticed that he was having slurred speech and confused.  He had trouble finding words.  He had a headache and visual changes as well.  No facial droop, motor weakness or subjective decrease in sensation.  There were no gait abnormalities.  However, by the time that the patient arrived to the emergency department, he was outside of the TPA window.  He denies chest pain, palpitations, dizziness, diaphoresis, abdominal pain, nausea, emesis, diarrhea, constipation, melena or hematochezia.  No dysuria, frequency or hematuria.  Denies pruritus.  In the ED initial vital signs: Temp 97.6 F, RR 22, HR 94, BP 133/69, and SpO2 sat 100% on room air. His urinalysis showed a hazy appearance, glucosuria more than 500 and minimal ketonuria of 5mg /dL .  There was 21-50 RBC and 10-20 WBC per hpf, but there was no hemoglobinuria on urine dipstick.  Urine toxicology was negative.  Rare bacteria on microscopic.  CBC showed a white count was 7.0, hemoglobin 13.2 g/dL and platelets 212.  PT 14.2 seconds, INR 1.11 and APTT mildly elevated at 40 seconds.  His CMP shows sodium 135,  potassium 4.0, chloride 105 and CO2 of 21 mmol /L.  BUN 27, creatinine 1.46 and glucose 268 mg/dL.  LFTs are within normal limits. Chest xray showed chronic opacity in the right perihilar region, which seems to be unchanged when compared to recent chest CT.  CT head did not have any acute findings.  However there were mild chronic small vessel ischemic changes in the white matter.  Please see images and full radiology report for further detail.   MDM/Assessment & Plan:   Hospital Problems: Principal Problem:   TIA (transient ischemic attack) Active Problems:   DM type 2 (diabetes mellitus, type 2) (HCC)   HYPERCHOLESTEROLEMIA  IIA   TOBACCO ABUSE   PARKINSON'S DISEASE   Essential hypertension   Coronary atherosclerosis   COPD (chronic obstructive pulmonary disease) (HCC)   PAD (peripheral artery disease) (HCC)   PE (pulmonary thromboembolism) ([place])   Adenocarcinoma of right lung, stage 3 (HCC)   Hypothyroidism   AKI (acute kidney injury) ([place])   UTI (urinary tract infection)  1. TIA - Patient admitted to observation/telemetry. With Neuro checks every 4 hours. Patient has refused neuro checks stating he just wants to sleep. Continue apixaban. Patient to receive PT/OT and SLP language evaluation today. MRI of brain indicates no acute or cerebrovascular accidents/stroke. Echocardiogram ordered for today. 2.  DM type 2 - Will discontinue po medications while inpatient. Patient placed on sensitive hypoglycemic scale and bedtime correction coverage sliding scale. A1c pending. 3. Hypercholesterolemia - Will continue home medications and monitor LFTS. Fasting lipid show cholesterol 220, LDL  146, triglycerides 157. 4. Tobacco abuse -  Smoking cessation information to be given by staff. Patient declines nicotine patch at this time. 5. Parkinson's disease - Mild resting tremor. Patient not on antiparkinson agents at this time. 6. Essential hypertension - Patient not on regular antihypertension  medication. Initial BP was 139/69. Patient's BP has trended down and is holding stable at 92/63. 7. Coronary atherosclerosis - Patient denies recent chest pain. Will continue eliquis, crestor and zetia. 8. COPD - On supplemental O2 to maintain SpO2 >92. Rsepirtory therapy treating with nebulizers as needed.  9. PAD - patient is on elequis and antilipemic medicaitons. 10. PE - History of DVT. Currently on Elequis. 11. Adenocarcinoma of right lung stage 3 - Patient currently being followed and treated by oncology. 12. Hypothyroidism - will continue home medication. 13. AKI - patient has elevated creatinine and BUN on BMP. Will start NS 150mL/hr IV for 24 hours. Will order CMP today. 14. UTI - Will start Rocephin 1 g in NS 131mL IV every 24 hours. Urine culture pending.  DVT prophylaxis: Eliquis Code Status: DNR Family Communication: None Disposition Plan: Observation/telemetry pending MRI and echocardiogram   Procedures:  Echocardiogram   Subjective: Patient was acutely alert and stated that he feels ok today. He states that ha has had as many as 10 strokes in the past, and this is not new to him.   Objective: Vitals:   11/20/17 0243 11/20/17 0443 11/20/17 0651 11/20/17 0653  BP: 105/68 (!) 91/59 (!) 79/37 92/63  Pulse: 89 73 72   Resp: 12 16 16    Temp: 98.3 F (36.8 C) (!) 97.4 F (36.3 C) (!) 97.4 F (36.3 C)   TempSrc: Oral  Oral   SpO2: 100% 100% 98%   Weight:      Height:        Intake/Output Summary (Last 24 hours) at 11/20/2017 1017 Last data filed at 11/20/2017 6962 Gross per 24 hour  Intake 177.46 ml  Output -  Net 177.46 ml   Filed Weights   11/19/17 1607  Weight: 73 kg (161 lb)     REVIEW OF SYSTEMS  As per history otherwise all reviewed and reported negative  Exam:  General exam: Alert x 4, sitting upright in bed, calm, cooperative. NAD. Respiratory system: Clear. No increased work of breathing. Cardiovascular system: S1 & S2 heard, RRR. No JVD,  murmurs, gallops, clicks or pedal edema. Gastrointestinal system: Abdomen is nondistended, soft and nontender. Normal bowel sounds heard. Central nervous system: Alert and oriented. No focal neurological deficits. Extremities: no CCE.  Data Reviewed: Basic Metabolic Panel: Recent Labs  Lab 11/15/17 1501 11/19/17 1618 11/19/17 1623  NA 134* 135 136  K 4.1 4.0 4.1  CL 103 105 104  CO2 22 21*  --   GLUCOSE 158* 268* 257*  BUN 13 27* 26*  CREATININE 1.19 1.46* 1.50*  CALCIUM 9.6 9.3  --    Liver Function Tests: Recent Labs  Lab 11/15/17 1501 11/19/17 1618  AST 16 24  ALT 10 12  ALKPHOS 92 72  BILITOT 0.4 0.7  PROT 7.7 7.5  ALBUMIN 4.4 4.2   No results for input(s): LIPASE, AMYLASE in the last 168 hours. No results for input(s): AMMONIA in the last 168 hours. CBC: Recent Labs  Lab 11/15/17 1501 11/19/17 1618 11/19/17 1623  WBC 6.1 7.0  --   NEUTROABS 4.3 5.3  --   HGB 13.1 13.2 13.3  HCT 39.0 39.2 39.0  MCV 86.0 86.9  --  PLT 211 212  --    Cardiac Enzymes: No results for input(s): CKTOTAL, CKMB, CKMBINDEX, TROPONINI in the last 168 hours. CBG (last 3)  Recent Labs    11/19/17 1615 11/19/17 2245 11/20/17 0819  GLUCAP 216* 147* 108*   No results found for this or any previous visit (from the past 240 hour(s)).   Studies: Dg Chest 2 View  Result Date: 11/19/2017 CLINICAL DATA:  Lung cancer, altered mental status EXAM: CHEST - 2 VIEW COMPARISON:  CT chest 11/15/2017, chest radiograph 07/13/2017 FINDINGS: Normal heart size and pulmonary vascularity. Increased markings in the RIGHT perihilar region into the RIGHT middle and RIGHT lower lobes unchanged from the recent CT. No acute infiltrate, pleural effusion or pneumothorax. Bones unremarkable IMPRESSION: Chronic opacity in the RIGHT perihilar region extending in the RIGHT middle and RIGHT lower lobes corresponding to recent CT findings, question related to radiation therapy. No definite interval change.  Electronically Signed   By: Lavonia Dana M.D.   On: 11/19/2017 20:56   Ct Head Wo Contrast  Result Date: 11/19/2017 CLINICAL DATA:  Altered level of consciousness, unexplained. EXAM: CT HEAD WITHOUT CONTRAST TECHNIQUE: Contiguous axial images were obtained from the base of the skull through the vertex without intravenous contrast. COMPARISON:  Head CT dated 01/28/2010 and brain MRI dated 11/28/2016. FINDINGS: Brain: Mild generalized age related volume loss with commensurate dilatation of the ventricles and sulci. Mild chronic small vessel ischemic changes again noted within the bilateral periventricular white matter regions. There is no mass, hemorrhage, edema or other evidence of acute parenchymal abnormality. No extra-axial hemorrhage. Vascular: There are chronic calcified atherosclerotic changes of the large vessels at the skull base. No unexpected hyperdense vessel. Skull: Normal. Negative for fracture or focal lesion. Sinuses/Orbits: No acute finding. Other: None. IMPRESSION: 1. No acute findings.  No intracranial mass, hemorrhage or edema. 2. Mild chronic small vessel ischemic changes in the white matter. Electronically Signed   By: Franki Cabot M.D.   On: 11/19/2017 16:57   Mr Brain Wo Contrast  Result Date: 11/20/2017 CLINICAL DATA:  Slurred speech. Weakness of both lower extremities. History of stage III lung cancer. EXAM: MRI HEAD WITHOUT CONTRAST MRA HEAD WITHOUT CONTRAST TECHNIQUE: Multiplanar, multiecho pulse sequences of the brain and surrounding structures were obtained without intravenous contrast. Angiographic images of the head were obtained using MRA technique without contrast. COMPARISON:  CT 11/19/2017.  MRI 11/28/2016. FINDINGS: MRI HEAD FINDINGS Brain: Diffusion imaging does not show any acute or subacute infarction or other cause of restricted diffusion. Brainstem and cerebellum are normal except for a small vessel infarction in the left cerebellum. Cerebral hemispheres show minimal  small vessel change of the white matter. No cortical or large vessel territory infarction. No sign of mass lesion, or hydrocephalus. At the left parieto-occipital convexity, there is a thin rim of subdural signal, 1-2 mm in thickness, that could represent minimal residual from a previous left subdural. This does not have any mass effect. Without contrast, tiny metastasis could be inapparent. Vascular: Major vessels at the base of the brain show flow. The right vertebral artery is a large vessel that primarily terminates in PICA, with only a minimal contribution to the basilar. Skull and upper cervical spine: Negative Sinuses/Orbits: Clear/normal Other: None MRA HEAD FINDINGS Both internal carotid arteries are widely patent into the brain. No siphon stenosis. The anterior and middle cerebral vessels are patent without proximal stenosis, aneurysm or vascular malformation. As noted above, the left vertebral artery is dominant, widely patent to  the basilar. The right vertebral artery largely terminates in PICA, with only a tiny contribution to the basilar. No basilar stenosis. Posterior circulation branch vessels appear normal. IMPRESSION: No sign of acute or subacute infarction. No sign of metastatic disease. Tiny metastasis could be inapparent without contrast, but there is no finding to raise that concern. Very small amount of subdural signal visible on FLAIR imaging at the left parieto-occipital convexity could represent a minimal residual from previous subdural hematoma. This does not have any mass effect. Normal intracranial MR angiography of the large and medium size vessels. Electronically Signed   By: Nelson Chimes M.D.   On: 11/20/2017 09:50   US Carotid Bilateral (at [facility] And [facility] Only)  Result Date: 11/20/2017 CLINICAL DATA:  Slurred speech and altered mental status EXAM: BILATERAL CAROTID DUPLEX ULTRASOUND TECHNIQUE: Pearline Cables scale imaging, color Doppler and duplex ultrasound were performed of bilateral carotid  and vertebral arteries in the neck. COMPARISON:  11/20/2017 FINDINGS: Criteria: Quantification of carotid stenosis is based on velocity parameters that correlate the residual internal carotid diameter with NASCET-based stenosis levels, using the diameter of the distal internal carotid lumen as the denominator for stenosis measurement. The following velocity measurements were obtained: RIGHT ICA: 84/27 cm/sec CCA: 01/60 cm/sec SYSTOLIC ICA/CCA RATIO:  1.09 ECA:  174 cm/sec LEFT ICA: 103/32 cm/sec CCA: 32/35 cm/sec SYSTOLIC ICA/CCA RATIO:  1.1 ECA:  131 cm/sec RIGHT CAROTID ARTERY: Moderate carotid intimal thickening and heterogeneous atherosclerosis despite this, there is no hemodynamically significant ICA stenosis, velocity elevation, or turbulent flow. Degree of narrowing estimated less than 50% by ultrasound criteria. RIGHT VERTEBRAL ARTERY: Small in caliber and difficult to visualize but appears antegrade LEFT CAROTID ARTERY: Similar moderate carotid intimal thickening and heterogeneous partially calcified atherosclerosis. Despite this, there is no hemodynamically significant left ICA stenosis, velocity elevation, or turbulent flow. Degree of narrowing also estimated less than 50% by ultrasound criteria. LEFT VERTEBRAL ARTERY:  Antegrade IMPRESSION: Moderate carotid atherosclerosis and intimal thickening. No hemodynamically significant ICA stenosis by ultrasound criteria. Degree of narrowing estimated at less than 50% bilaterally. Antegrade vertebral flow bilaterally, right vertebral artery appears very small in caliber and difficult to visualize. Electronically Signed   By: Jerilynn Mages.  Shick M.D.   On: 11/20/2017 09:51   Mr Jodene Nam Head/brain TD Cm  Result Date: 11/20/2017 CLINICAL DATA:  Slurred speech. Weakness of both lower extremities. History of stage III lung cancer. EXAM: MRI HEAD WITHOUT CONTRAST MRA HEAD WITHOUT CONTRAST TECHNIQUE: Multiplanar, multiecho pulse sequences of the brain and surrounding structures  were obtained without intravenous contrast. Angiographic images of the head were obtained using MRA technique without contrast. COMPARISON:  CT 11/19/2017.  MRI 11/28/2016. FINDINGS: MRI HEAD FINDINGS Brain: Diffusion imaging does not show any acute or subacute infarction or other cause of restricted diffusion. Brainstem and cerebellum are normal except for a small vessel infarction in the left cerebellum. Cerebral hemispheres show minimal small vessel change of the white matter. No cortical or large vessel territory infarction. No sign of mass lesion, or hydrocephalus. At the left parieto-occipital convexity, there is a thin rim of subdural signal, 1-2 mm in thickness, that could represent minimal residual from a previous left subdural. This does not have any mass effect. Without contrast, tiny metastasis could be inapparent. Vascular: Major vessels at the base of the brain show flow. The right vertebral artery is a large vessel that primarily terminates in PICA, with only a minimal contribution to the basilar. Skull and upper cervical spine: Negative Sinuses/Orbits: Clear/normal Other: None  MRA HEAD FINDINGS Both internal carotid arteries are widely patent into the brain. No siphon stenosis. The anterior and middle cerebral vessels are patent without proximal stenosis, aneurysm or vascular malformation. As noted above, the left vertebral artery is dominant, widely patent to the basilar. The right vertebral artery largely terminates in PICA, with only a tiny contribution to the basilar. No basilar stenosis. Posterior circulation branch vessels appear normal. IMPRESSION: No sign of acute or subacute infarction. No sign of metastatic disease. Tiny metastasis could be inapparent without contrast, but there is no finding to raise that concern. Very small amount of subdural signal visible on FLAIR imaging at the left parieto-occipital convexity could represent a minimal residual from previous subdural hematoma. This  does not have any mass effect. Normal intracranial MR angiography of the large and medium size vessels. Electronically Signed   By: Nelson Chimes M.D.   On: 11/20/2017 09:50   US Abdomen Limited Ruq  Result Date: 11/20/2017 CLINICAL DATA:  Mild right upper quadrant pain and epigastric tenderness EXAM: ULTRASOUND ABDOMEN LIMITED RIGHT UPPER QUADRANT COMPARISON:  CT AP 07/13/2017 FINDINGS: Gallbladder: No gallstones. Gallbladder wall is upper limits of normal in thickness measuring 3 mm. No sonographic Murphy sign noted by sonographer. Common bile duct: Diameter: 3 mm Liver: No focal lesion identified. Within normal limits in parenchymal echogenicity. Portal vein is patent on color Doppler imaging with normal direction of blood flow towards the liver. IMPRESSION: 1. The gallbladder wall thickness is upper limits of normal at 3 mm. The gallbladder is otherwise normal without stones or sonographic Murphy's sign. Electronically Signed   By: Kerby Moors M.D.   On: 11/20/2017 09:49     Scheduled Meds: . apixaban  5 mg Oral BID  . dicyclomine  20 mg Oral TID AC  . ezetimibe  10 mg Oral Daily  . feeding supplement (GLUCERNA SHAKE)  237 mL Oral TID BM  . gabapentin  300 mg Oral TID  . guaiFENesin  600 mg Oral BID  . insulin aspart  0-5 Units Subcutaneous QHS  . insulin aspart  0-9 Units Subcutaneous TID WC  . levothyroxine  100 mcg Oral QAC breakfast  . mometasone-formoterol  2 puff Inhalation BID  . pantoprazole  40 mg Oral Daily  . rosuvastatin  20 mg Oral Daily  . sertraline  50 mg Oral Daily   Continuous Infusions: . sodium chloride 88 mL/hr at 11/20/17 0447  . cefTRIAXone (ROCEPHIN)  IV 1 g (11/20/17 1013)    Principal Problem:   TIA (transient ischemic attack) Active Problems:   DM type 2 (diabetes mellitus, type 2) (HCC)   HYPERCHOLESTEROLEMIA  IIA   TOBACCO ABUSE   PARKINSON'S DISEASE   Essential hypertension   Coronary atherosclerosis   COPD (chronic obstructive pulmonary  disease) (HCC)   PAD (peripheral artery disease) (HCC)   PE (pulmonary thromboembolism) ([place])   Adenocarcinoma of right lung, stage 3 (HCC)   Hypothyroidism   AKI (acute kidney injury) ([place])   UTI (urinary tract infection)   Time spent:   Ashok Norris, Student AGACNP  Attending:  Irwin Brakeman, MD, FAAFP [facility] Pager [phone]  If 7PM-7AM, please contact night-coverage [email or web address] Password [email or web address] 11/20/2017, 10:17 AM    LOS: 0 days

## 2017-11-20 NOTE — Progress Notes (Signed)
SLP Cancellation Note  Patient Details Name: Matthew Wilkins MRN: [id number] DOB: Nov 04, 1945   Cancelled treatment:       Reason Eval/Treat Not Completed: SLP screened, no needs identified, will sign off. SLP screened Pt in room. Pt denies any changes in swallowing, speech, language, or cognition. MRI negative for acute changes. SLE will be deferred at this time. Reconsult if indicated. SLP will sign off. Pt states that his speech has returned to baseline and he has a "terrible headache" only at this time. RN notified of pain.  Thank you,  Genene Churn, [place]     [place] 11/20/2017, 4:06 PM

## 2017-11-20 NOTE — Progress Notes (Signed)
MD informed of PT soft BP. Continue to monitor.

## 2017-11-20 NOTE — Care Management Obs Status (Signed)
[facility] NOTIFICATION   Patient Details  Name: LEVOY GEISEN MRN: [id number] Date of Birth: 02/06/1946   Medicare Observation Status Notification Given:  Yes    Avien Taha, [place]un[place]y [place], RN 11/20/2017, 12:43 PM

## 2017-11-21 DIAGNOSIS — C3491 Malignant neoplasm of unspecified part of right bronchus or lung: Secondary | ICD-10-CM

## 2017-11-21 DIAGNOSIS — N179 Acute kidney failure, unspecified: Secondary | ICD-10-CM | POA: Diagnosis not present

## 2017-11-21 DIAGNOSIS — J449 Chronic obstructive pulmonary disease, unspecified: Secondary | ICD-10-CM | POA: Diagnosis not present

## 2017-11-21 DIAGNOSIS — G459 Transient cerebral ischemic attack, unspecified: Secondary | ICD-10-CM

## 2017-11-21 DIAGNOSIS — I10 Essential (primary) hypertension: Secondary | ICD-10-CM

## 2017-11-21 DIAGNOSIS — E118 Type 2 diabetes mellitus with unspecified complications: Secondary | ICD-10-CM | POA: Diagnosis not present

## 2017-11-21 LAB — COMPREHENSIVE METABOLIC PANEL
ALBUMIN: 3.3 g/dL — AB (ref 3.5–5.0)
ALK PHOS: 62 U/L (ref 38–126)
ALT: 10 U/L (ref 0–44)
AST: 20 U/L (ref 15–41)
Anion gap: 6 (ref 5–15)
BILIRUBIN TOTAL: 0.6 mg/dL (ref 0.3–1.2)
BUN: 18 mg/dL (ref 8–23)
CO2: 23 mmol/L (ref 22–32)
CREATININE: 1.16 mg/dL (ref 0.61–1.24)
Calcium: 8.5 mg/dL — ABNORMAL LOW (ref 8.9–10.3)
Chloride: 107 mmol/L (ref 98–111)
GFR calc Af Amer: >60 mL/min (ref >60)
GLUCOSE: 174 mg/dL — AB (ref 70–99)
POTASSIUM: 4.1 mmol/L (ref 3.5–5.1)
Sodium: 136 mmol/L (ref 135–145)
TOTAL PROTEIN: 6.1 g/dL — AB (ref 6.5–8.1)

## 2017-11-21 LAB — GLUCOSE, CAPILLARY
Glucose-Capillary: 154 mg/dL — ABNORMAL HIGH (ref 70–99)
Glucose-Capillary: 203 mg/dL — ABNORMAL HIGH (ref 70–99)
Glucose-Capillary: 147 mg/dL — ABNORMAL HIGH (ref 70–99)

## 2017-11-21 LAB — URINE CULTURE

## 2017-11-21 NOTE — Discharge Summary (Signed)
Physician Discharge Summary  BECKETT MADEN QQI:[id number] DOB: [birth year] DOA: 11/19/2017  PCP: [name], MD  Admit date: 11/19/2017 Discharge date: 11/21/2017  Admitted From: Home Disposition: Home  Recommendations for Outpatient Follow-up:  1. Follow up with PCP in 1-2 weeks 2. Please obtain BMP/CBC in one week 3. Please follow up on the following pending results:  Home Health: Refused any home health Equipment/Devices:  Discharge Condition: Stable CODE STATUS: DNR Diet recommendation: Heart healthy, carb modified  Brief/Interim Summary: 72 year old male with a history of stage III adenocarcinoma along, coronary artery disease, diabetes, previous TIA, history of DVT on anticoagulation, presents to the hospital with slurred speech and confusion.  There was concern that he may have had another TIA.  Was also noted to be volume depleted and had elevated creatinine.  He was started on IV fluids with improvement in renal function.  Neuro symptoms also resolved.  He had extensive work-up with MRI of the brain that did not show any acute infarct.  Carotid ultrasound did not show any significant stenosis bilaterally.  Review of lipid panel indicates elevated LDL.  Patient admits that he does not take statin on a regular basis.  I suspect that his symptoms may be related to dehydration.  Urine culture was performed that showed multiple species.  The patient is feeling back to baseline.  He is been advised to be compliant with his medications.  Home health was offered but he has refused any type of home health.  Patient otherwise feels ready for discharge.  Discharge Diagnoses:  Principal Problem:   TIA (transient ischemic attack) Active Problems:   DM type 2 (diabetes mellitus, type 2) (HCC)   HYPERCHOLESTEROLEMIA  IIA   TOBACCO ABUSE   PARKINSON'S DISEASE   Essential hypertension   Coronary atherosclerosis   COPD (chronic obstructive pulmonary disease) (HCC)   PAD (peripheral artery  disease) (HCC)   PE (pulmonary thromboembolism) ([place])   Adenocarcinoma of right lung, stage 3 (HCC)   Hypothyroidism   AKI (acute kidney injury) ([place])   UTI (urinary tract infection)   Aphasia   RUQ abdominal pain    Discharge Instructions  Discharge Instructions    Diet - low sodium heart healthy   Complete by:  As directed    Increase activity slowly   Complete by:  As directed      Allergies as of 11/21/2017      Reactions   Aspirin Other (See Comments)   Nose bleeds       Medication List    TAKE these medications   ADVAIR DISKUS 250-50 MCG/DOSE Aepb Generic drug:  Fluticasone-Salmeterol Inhale 1 puff into the lungs every 12 (twelve) hours.   albuterol 108 (90 Base) MCG/ACT inhaler Commonly known as:  PROVENTIL HFA;VENTOLIN HFA Inhale 1-2 puffs into the lungs every 6 (six) hours as needed for wheezing or shortness of breath.   apixaban 5 MG Tabs tablet Commonly known as:  ELIQUIS Take 1 tablet (5 mg total) by mouth 2 (two) times daily.   dicyclomine 20 MG tablet Commonly known as:  BENTYL Take 1 tablet by mouth 3 (three) times daily.   feeding supplement (GLUCERNA SHAKE) Liqd Take 237 mLs by mouth 3 (three) times daily between meals.   gabapentin 300 MG capsule Commonly known as:  NEURONTIN Take 1 capsule by mouth 3 (three) times daily.   glipiZIDE 5 MG 24 hr tablet Commonly known as:  GLUCOTROL XL Take 10 mg by mouth 2 (two) times daily.  guaiFENesin 600 MG 12 hr tablet Commonly known as:  MUCINEX Take 1 tablet (600 mg total) by mouth 2 (two) times daily.   guaiFENesin-dextromethorphan 100-10 MG/5ML syrup Commonly known as:  ROBITUSSIN DM Take 5 mLs by mouth every 4 (four) hours as needed for cough.   levothyroxine 100 MCG tablet Commonly known as:  SYNTHROID Take 1 tablet (100 mcg total) by mouth daily before breakfast.   metFORMIN 500 MG 24 hr tablet Commonly known as:  GLUCOPHAGE-XR Take 2 tablets by mouth 2 (two) times daily.   omeprazole  40 MG capsule Commonly known as:  PRILOSEC Take 1 capsule by mouth daily.   rosuvastatin 20 MG tablet Commonly known as:  CRESTOR Take 1 tablet (20 mg total) by mouth daily.   sertraline 25 MG tablet Commonly known as:  ZOLOFT Take 50 mg by mouth daily.   ZETIA 10 MG tablet Generic drug:  ezetimibe Take 10 mg by mouth daily.       Allergies  Allergen Reactions  . Aspirin Other (See Comments)    Nose bleeds     Consultations:     Procedures/Studies: Dg Chest 2 View  Result Date: 11/19/2017 CLINICAL DATA:  Lung cancer, altered mental status EXAM: CHEST - 2 VIEW COMPARISON:  CT chest 11/15/2017, chest radiograph 07/13/2017 FINDINGS: Normal heart size and pulmonary vascularity. Increased markings in the RIGHT perihilar region into the RIGHT middle and RIGHT lower lobes unchanged from the recent CT. No acute infiltrate, pleural effusion or pneumothorax. Bones unremarkable IMPRESSION: Chronic opacity in the RIGHT perihilar region extending in the RIGHT middle and RIGHT lower lobes corresponding to recent CT findings, question related to radiation therapy. No definite interval change. Electronically Signed   By: Lavonia Dana M.D.   On: 11/19/2017 20:56   Ct Head Wo Contrast  Result Date: 11/19/2017 CLINICAL DATA:  Altered level of consciousness, unexplained. EXAM: CT HEAD WITHOUT CONTRAST TECHNIQUE: Contiguous axial images were obtained from the base of the skull through the vertex without intravenous contrast. COMPARISON:  Head CT dated 01/28/2010 and brain MRI dated 11/28/2016. FINDINGS: Brain: Mild generalized age related volume loss with commensurate dilatation of the ventricles and sulci. Mild chronic small vessel ischemic changes again noted within the bilateral periventricular white matter regions. There is no mass, hemorrhage, edema or other evidence of acute parenchymal abnormality. No extra-axial hemorrhage. Vascular: There are chronic calcified atherosclerotic changes of the  large vessels at the skull base. No unexpected hyperdense vessel. Skull: Normal. Negative for fracture or focal lesion. Sinuses/Orbits: No acute finding. Other: None. IMPRESSION: 1. No acute findings.  No intracranial mass, hemorrhage or edema. 2. Mild chronic small vessel ischemic changes in the white matter. Electronically Signed   By: Franki Cabot M.D.   On: 11/19/2017 16:57   Ct Chest W Contrast  Result Date: 11/16/2017 CLINICAL DATA:  Non-small-cell lung cancer. EXAM: CT CHEST WITH CONTRAST TECHNIQUE: Multidetector CT imaging of the chest was performed during intravenous contrast administration. CONTRAST:  42mL OMNIPAQUE IOHEXOL 300 MG/ML  SOLN COMPARISON:  07/05/2017 FINDINGS: Cardiovascular: Heart size normal. Trace anterior pericardial fluid evident. Coronary artery calcification is evident. Atherosclerotic calcification is noted in the wall of the thoracic aorta. Mediastinum/Nodes: 11 mm subcarinal lymph node measured previously is stable at 11 mm today. No left hilar lymphadenopathy. Post radiation scarring noted in the right hilum and parahilar lung. Fluid in the mid esophagus compatible with reflux or dysmotility. Lungs/Pleura: The central tracheobronchial airways are patent. Centrilobular emphysema. Evolution of radiation scarring in the  medial right lung. Nodular component in the anterior right lower lobe (5:96) is progressive in the interval and while likely scarring related, attention on follow-up recommended. No new suspicious pulmonary nodule or mass. No pleural effusion. Upper Abdomen: Unremarkable Musculoskeletal: No worrisome lytic or sclerotic osseous abnormality. IMPRESSION: 1. Interval evolution of radiation changes in the medial right lung. Nodular component to these changes in the anterior right lower lobe is most likely evolving scar but continued attention on follow-up recommended. 2.  Emphysema. (ICD10-J43.9) 3.  Aortic Atherosclerois (ICD10-170.0) Electronically Signed   By: Misty Stanley M.D.   On: 11/16/2017 07:19   Mr Brain Wo Contrast  Result Date: 11/20/2017 CLINICAL DATA:  Slurred speech. Weakness of both lower extremities. History of stage III lung cancer. EXAM: MRI HEAD WITHOUT CONTRAST MRA HEAD WITHOUT CONTRAST TECHNIQUE: Multiplanar, multiecho pulse sequences of the brain and surrounding structures were obtained without intravenous contrast. Angiographic images of the head were obtained using MRA technique without contrast. COMPARISON:  CT 11/19/2017.  MRI 11/28/2016. FINDINGS: MRI HEAD FINDINGS Brain: Diffusion imaging does not show any acute or subacute infarction or other cause of restricted diffusion. Brainstem and cerebellum are normal except for a small vessel infarction in the left cerebellum. Cerebral hemispheres show minimal small vessel change of the white matter. No cortical or large vessel territory infarction. No sign of mass lesion, or hydrocephalus. At the left parieto-occipital convexity, there is a thin rim of subdural signal, 1-2 mm in thickness, that could represent minimal residual from a previous left subdural. This does not have any mass effect. Without contrast, tiny metastasis could be inapparent. Vascular: Major vessels at the base of the brain show flow. The right vertebral artery is a large vessel that primarily terminates in PICA, with only a minimal contribution to the basilar. Skull and upper cervical spine: Negative Sinuses/Orbits: Clear/normal Other: None MRA HEAD FINDINGS Both internal carotid arteries are widely patent into the brain. No siphon stenosis. The anterior and middle cerebral vessels are patent without proximal stenosis, aneurysm or vascular malformation. As noted above, the left vertebral artery is dominant, widely patent to the basilar. The right vertebral artery largely terminates in PICA, with only a tiny contribution to the basilar. No basilar stenosis. Posterior circulation branch vessels appear normal. IMPRESSION: No sign of  acute or subacute infarction. No sign of metastatic disease. Tiny metastasis could be inapparent without contrast, but there is no finding to raise that concern. Very small amount of subdural signal visible on FLAIR imaging at the left parieto-occipital convexity could represent a minimal residual from previous subdural hematoma. This does not have any mass effect. Normal intracranial MR angiography of the large and medium size vessels. Electronically Signed   By: Nelson Chimes M.D.   On: 11/20/2017 09:50   US Carotid Bilateral (at [facility] Only)  Result Date: 11/20/2017 CLINICAL DATA:  Slurred speech and altered mental status EXAM: BILATERAL CAROTID DUPLEX ULTRASOUND TECHNIQUE: Pearline Cables scale imaging, color Doppler and duplex ultrasound were performed of bilateral carotid and vertebral arteries in the neck. COMPARISON:  11/20/2017 FINDINGS: Criteria: Quantification of carotid stenosis is based on velocity parameters that correlate the residual internal carotid diameter with NASCET-based stenosis levels, using the diameter of the distal internal carotid lumen as the denominator for stenosis measurement. The following velocity measurements were obtained: RIGHT ICA: 84/27 cm/sec CCA: 89/21 cm/sec SYSTOLIC ICA/CCA RATIO:  1.94 ECA:  174 cm/sec LEFT ICA: 103/32 cm/sec CCA: 17/40 cm/sec SYSTOLIC ICA/CCA RATIO:  1.1 ECA:  131  cm/sec RIGHT CAROTID ARTERY: Moderate carotid intimal thickening and heterogeneous atherosclerosis despite this, there is no hemodynamically significant ICA stenosis, velocity elevation, or turbulent flow. Degree of narrowing estimated less than 50% by ultrasound criteria. RIGHT VERTEBRAL ARTERY: Small in caliber and difficult to visualize but appears antegrade LEFT CAROTID ARTERY: Similar moderate carotid intimal thickening and heterogeneous partially calcified atherosclerosis. Despite this, there is no hemodynamically significant left ICA stenosis, velocity elevation, or turbulent flow. Degree of  narrowing also estimated less than 50% by ultrasound criteria. LEFT VERTEBRAL ARTERY:  Antegrade IMPRESSION: Moderate carotid atherosclerosis and intimal thickening. No hemodynamically significant ICA stenosis by ultrasound criteria. Degree of narrowing estimated at less than 50% bilaterally. Antegrade vertebral flow bilaterally, right vertebral artery appears very small in caliber and difficult to visualize. Electronically Signed   By: Jerilynn Mages.  Shick M.D.   On: 11/20/2017 09:51   Mr Jodene Nam Head/brain FV Cm  Result Date: 11/20/2017 CLINICAL DATA:  Slurred speech. Weakness of both lower extremities. History of stage III lung cancer. EXAM: MRI HEAD WITHOUT CONTRAST MRA HEAD WITHOUT CONTRAST TECHNIQUE: Multiplanar, multiecho pulse sequences of the brain and surrounding structures were obtained without intravenous contrast. Angiographic images of the head were obtained using MRA technique without contrast. COMPARISON:  CT 11/19/2017.  MRI 11/28/2016. FINDINGS: MRI HEAD FINDINGS Brain: Diffusion imaging does not show any acute or subacute infarction or other cause of restricted diffusion. Brainstem and cerebellum are normal except for a small vessel infarction in the left cerebellum. Cerebral hemispheres show minimal small vessel change of the white matter. No cortical or large vessel territory infarction. No sign of mass lesion, or hydrocephalus. At the left parieto-occipital convexity, there is a thin rim of subdural signal, 1-2 mm in thickness, that could represent minimal residual from a previous left subdural. This does not have any mass effect. Without contrast, tiny metastasis could be inapparent. Vascular: Major vessels at the base of the brain show flow. The right vertebral artery is a large vessel that primarily terminates in PICA, with only a minimal contribution to the basilar. Skull and upper cervical spine: Negative Sinuses/Orbits: Clear/normal Other: None MRA HEAD FINDINGS Both internal carotid arteries are  widely patent into the brain. No siphon stenosis. The anterior and middle cerebral vessels are patent without proximal stenosis, aneurysm or vascular malformation. As noted above, the left vertebral artery is dominant, widely patent to the basilar. The right vertebral artery largely terminates in PICA, with only a tiny contribution to the basilar. No basilar stenosis. Posterior circulation branch vessels appear normal. IMPRESSION: No sign of acute or subacute infarction. No sign of metastatic disease. Tiny metastasis could be inapparent without contrast, but there is no finding to raise that concern. Very small amount of subdural signal visible on FLAIR imaging at the left parieto-occipital convexity could represent a minimal residual from previous subdural hematoma. This does not have any mass effect. Normal intracranial MR angiography of the large and medium size vessels. Electronically Signed   By: Nelson Chimes M.D.   On: 11/20/2017 09:50   US Abdomen Limited Ruq  Result Date: 11/20/2017 CLINICAL DATA:  Mild right upper quadrant pain and epigastric tenderness EXAM: ULTRASOUND ABDOMEN LIMITED RIGHT UPPER QUADRANT COMPARISON:  CT AP 07/13/2017 FINDINGS: Gallbladder: No gallstones. Gallbladder wall is upper limits of normal in thickness measuring 3 mm. No sonographic Murphy sign noted by sonographer. Common bile duct: Diameter: 3 mm Liver: No focal lesion identified. Within normal limits in parenchymal echogenicity. Portal vein is patent on color Doppler imaging with  normal direction of blood flow towards the liver. IMPRESSION: 1. The gallbladder wall thickness is upper limits of normal at 3 mm. The gallbladder is otherwise normal without stones or sonographic Murphy's sign. Electronically Signed   By: Kerby Moors M.D.   On: 11/20/2017 09:49       Subjective: No dizziness, lightheadedness, no difficulty with speech.  No changes in vision.  Discharge Exam: Vitals:   11/21/17 1347 11/21/17 1646  BP:  (!) 97/59 (!) 103/57  Pulse: 71 74  Resp: 20 16  Temp: 98.2 F (36.8 C) 97.9 F (36.6 C)  SpO2: 97% 98%   Vitals:   11/21/17 0847 11/21/17 1256 11/21/17 1347 11/21/17 1646  BP:  107/63 (!) 97/59 (!) 103/57  Pulse:  68 71 74  Resp:  16 20 16   Temp:  98.3 F (36.8 C) 98.2 F (36.8 C) 97.9 F (36.6 C)  TempSrc:  Oral Oral Oral  SpO2: 96% 99% 97% 98%  Weight:      Height:        General: Pt is alert, awake, not in acute distress Cardiovascular: RRR, S1/S2 +, no rubs, no gallops Respiratory: CTA bilaterally, no wheezing, no rhonchi Abdominal: Soft, NT, ND, bowel sounds + Extremities: no edema, no cyanosis    The results of significant diagnostics from this hospitalization (including imaging, microbiology, ancillary and laboratory) are listed below for reference.     Microbiology: Recent Results (from the past 240 hour(s))  Culture, Urine     Status: Abnormal   Collection Time: 11/19/17  7:09 PM  Result Value Ref Range Status   Specimen Description   Final    URINE, CLEAN CATCH Performed at [facility], [address], [place], [place] [address]    Special Requests   Final    NONE Performed at [facility], [address], [place], [place] [address]    Culture MULTIPLE SPECIES PRESENT, SUGGEST RECOLLECTION (A)  Final   Report Status 11/21/2017 FINAL  Final     Labs: BNP (last 3 results) No results for input(s): BNP in the last 8760 hours. Basic Metabolic Panel: Recent Labs  Lab 11/15/17 1501 11/19/17 1618 11/19/17 1623 11/21/17 0509  NA 134* 135 136 136  K 4.1 4.0 4.1 4.1  CL 103 105 104 107  CO2 22 21*  --  23  GLUCOSE 158* 268* 257* 174*  BUN 13 27* 26* 18  CREATININE 1.19 1.46* 1.50* 1.16  CALCIUM 9.6 9.3  --  8.5*   Liver Function Tests: Recent Labs  Lab 11/15/17 1501 11/19/17 1618 11/21/17 0509  AST 16 24 20   ALT 10 12 10   ALKPHOS 92 72 62  BILITOT 0.4 0.7 0.6  PROT 7.7 7.5 6.1*  ALBUMIN 4.4 4.2 3.3*   No results for input(s): LIPASE,  AMYLASE in the last 168 hours. No results for input(s): AMMONIA in the last 168 hours. CBC: Recent Labs  Lab 11/15/17 1501 11/19/17 1618 11/19/17 1623  WBC 6.1 7.0  --   NEUTROABS 4.3 5.3  --   HGB 13.1 13.2 13.3  HCT 39.0 39.2 39.0  MCV 86.0 86.9  --   PLT 211 212  --    Cardiac Enzymes: No results for input(s): CKTOTAL, CKMB, CKMBINDEX, TROPONINI in the last 168 hours. BNP: Invalid input(s): POCBNP CBG: Recent Labs  Lab 11/20/17 1700 11/20/17 2201 11/21/17 0818 11/21/17 1132 11/21/17 1604  GLUCAP 201* 151* 154* 203* 147*   D-Dimer No results for input(s): DDIMER in the last 72 hours.  Hgb A1c Recent Labs    11/20/17 0532  HGBA1C 8.0*   Lipid Profile Recent Labs    11/20/17 0532  CHOL 220*  HDL 43  LDLCALC 146*  TRIG 157*  CHOLHDL 5.1   Thyroid function studies No results for input(s): TSH, T4TOTAL, T3FREE, THYROIDAB in the last 72 hours.  Invalid input(s): FREET3 Anemia work up No results for input(s): VITAMINB12, FOLATE, FERRITIN, TIBC, IRON, RETICCTPCT in the last 72 hours. Urinalysis    Component Value Date/Time   COLORURINE YELLOW 11/19/2017 1908   APPEARANCEUR HAZY (A) 11/19/2017 1908   LABSPEC 1.022 11/19/2017 1908   PHURINE 5.0 11/19/2017 1908   GLUCOSEU >=500 (A) 11/19/2017 1908   HGBUR NEGATIVE 11/19/2017 1908   BILIRUBINUR NEGATIVE 11/19/2017 1908   KETONESUR 5 (A) 11/19/2017 1908   PROTEINUR NEGATIVE 11/19/2017 1908   UROBILINOGEN 0.2 01/27/2010 1529   NITRITE NEGATIVE 11/19/2017 1908   LEUKOCYTESUR NEGATIVE 11/19/2017 1908   Sepsis Labs Invalid input(s): PROCALCITONIN,  WBC,  LACTICIDVEN Microbiology Recent Results (from the past 240 hour(s))  Culture, Urine     Status: Abnormal   Collection Time: 11/19/17  7:09 PM  Result Value Ref Range Status   Specimen Description   Final    URINE, CLEAN CATCH Performed at [facility], [address] [place], [place] [address]    Special Requests   Final    NONE Performed at [facility], [address], [place], [place] [address]    Culture MULTIPLE SPECIES PRESENT, SUGGEST RECOLLECTION (A)  Final   Report Status 11/21/2017 FINAL  Final     Time coordinating discharge: 70mins  SIGNED:   Kathie Dike, MD  [facility] 11/21/2017, 8:42 PM Pager   If 7PM-7AM, please contact night-coverage [email or web address] Password [email or web address]

## 2017-11-21 NOTE — Progress Notes (Signed)
OT Cancellation Note  Patient Details Name: Matthew Wilkins MRN: [id number] DOB: 16-Oct-1945   Cancelled Treatment:    Reason Eval/Treat Not Completed: OT screened, no needs identified, will sign off. Pt with BUE strength WNL, sensation and coordination intact. Pt reports he is feeling much improved, declined to complete OOB tasks. Per chart review pt is near baseline with functional mobility. No further OT services required at this time.    Guadelupe Sabin, OTR/L  [phone] 11/21/2017, 7:52 AM

## 2017-11-21 NOTE — Progress Notes (Signed)
IV removed and discharge instructions reviewed.  No new medications to pick up.  Education packet on TIA included in discharge.  Daughter present and papers reviewed with her, as well.  Daughter to drive home

## 2017-11-21 NOTE — Progress Notes (Addendum)
Physical Therapy Treatment Patient Details Name: Matthew Wilkins MRN: [id number] DOB: April 02, 1946 Today's Date: 11/21/2017    History of Present Illness  Matthew Wilkins is a 72 y.o. male with medical history significant of stage III adenocarcinoma of the lung (failed chemo and radiotherapy, now on the immunotherapy), anxiety, arthritis, coronary artery disease, type 2 diabetes, history of DVT, history of pulmonary embolism, hyperlipidemia, hypertension, peripheral arterial disease who is coming to the emergency department due to slurred speech and confusion.    PT Comments    Patient functioning at baseline for bed mobility and gait.  Plan:  Patient discharged from physical therapy to care of nursing for ambulation daily as tolerated for length of stay.   Follow Up Recommendations  Home health PT;Other (comment)(Patient declines HHPT)     Equipment Recommendations  None recommended by PT    Recommendations for Other Services       Precautions / Restrictions Precautions Precautions: None Restrictions Weight Bearing Restrictions: No    Mobility  Bed Mobility Overal bed mobility: Modified Independent                Transfers Overall transfer level: Modified independent                  Ambulation/Gait Ambulation/Gait assistance: Modified independent (Device/Increase time) Gait Distance (Feet): 100 Feet Assistive device: None Gait Pattern/deviations: Decreased step length - right;Decreased step length - left;Decreased stride length Gait velocity: slow   General Gait Details: grossly baseline for patient, able to ambulate in hallway without loss of balance, but has to slow cadence more once fatigued.   Stairs             Wheelchair Mobility    Modified Rankin (Stroke Patients Only)       Balance Overall balance assessment: No apparent balance deficits (not formally assessed)                                          Cognition  Arousal/Alertness: Awake/alert Behavior During Therapy: WFL for tasks assessed/performed Overall Cognitive Status: Within Functional Limits for tasks assessed                                        Exercises      General Comments        Pertinent Vitals/Pain Pain Assessment: No/denies pain    Home Living                      Prior Function            PT Goals (current goals can now be found in the care plan section) Acute Rehab PT Goals Patient Stated Goal: return home PT Goal Formulation: With patient Time For Goal Achievement: 11/21/17 Potential to Achieve Goals: Good Progress towards PT goals: Progressing toward goals    Frequency           PT Plan Discharge plan needs to be updated    Co-evaluation              AM-PAC PT "6 Clicks" Daily Activity  Outcome Measure  Difficulty turning over in bed (including adjusting bedclothes, sheets and blankets)?: None Difficulty moving from lying on back to sitting on the side of the bed? : None Difficulty  sitting down on and standing up from a chair with arms (e.g., wheelchair, bedside commode, etc,.)?: None Help needed moving to and from a bed to chair (including a wheelchair)?: None Help needed walking in hospital room?: None Help needed climbing 3-5 steps with a railing? : A Little 6 Click Score: 23    End of Session   Activity Tolerance: Patient tolerated treatment well;Patient limited by fatigue Patient left: in bed;with call bell/phone within reach;with bed alarm set(seated at bedside) Nurse Communication: Mobility status PT Visit Diagnosis: Unsteadiness on feet (R26.81);Other abnormalities of gait and mobility (R26.89);Muscle weakness (generalized) (M62.81)     Time: 0076-2263 PT Time Calculation (min) (ACUTE ONLY): 20 min  Charges:  $Gait Training: 8-22 mins                    G Codes:       9:50 AM, 26-Nov-2017 Lonell Grandchild, MPT Physical Therapist with  [facility] [phone] office [phone] mobile phone

## 2017-11-29 NOTE — Progress Notes (Deleted)
[facility] OFFICE PROGRESS NOTE  Matthew Sacramento, MD [address] [place] [address]  DIAGNOSIS: Stage IIIA (T1a, N2, M0) non-small cell lung cancer, squamous cell carcinoma presented with right middle lobe pulmonary nodule in addition to ipsilateral hilar and ipsilateral mediastinal lymphadenopathy diagnosed in June 2018.  PRIOR THERAPY: A course of concurrent chemoradiation with weekly carboplatin for AUC of 2 and paclitaxel 45 MG/M2. Status post 7cycles.  CURRENT THERAPY: Consolidation immunotherapy with Imfinzi (Durvalumab) 10 MG/KG every 2 weeks.first dose 02/22/2017. Status post 14cycles.  INTERVAL HISTORY: Matthew Wilkins 72 y.o. male returns for *** regular *** visit for followup of ***   MEDICAL HISTORY: Past Medical History:  Diagnosis Date  . Adenocarcinoma of right lung, stage 3 ([place]) 11/17/2016  . Anxiety   . Arthritis   . Coronary artery disease   . Diabetes mellitus   . DVT (deep venous thrombosis) ([place])   . Encounter for antineoplastic chemotherapy 11/17/2016  . History of radiation therapy 11/28/16-01/06/17   right lung was treated to 60 Gy in 30 fractions  . Hypercholesterolemia   . Hypertension   . PAD (peripheral artery disease) ([place])   . SOB (shortness of breath)     ALLERGIES:  is allergic to aspirin.  MEDICATIONS:  Current Outpatient Medications  Medication Sig Dispense Refill  . albuterol (PROVENTIL HFA;VENTOLIN HFA) 108 (90 Base) MCG/ACT inhaler Inhale 1-2 puffs into the lungs every 6 (six) hours as needed for wheezing or shortness of breath.     [name] apixaban (ELIQUIS) 5 MG TABS tablet Take 1 tablet (5 mg total) by mouth 2 (two) times daily. 60 tablet 0  . dicyclomine (BENTYL) 20 MG tablet Take 1 tablet by mouth 3 (three) times daily.    . feeding supplement, GLUCERNA SHAKE, (GLUCERNA SHAKE) LIQD Take 237 mLs by mouth 3 (three) times daily between meals. (Patient not taking: Reported on 11/19/2017) 90 Can 0  . Fluticasone-Salmeterol  (ADVAIR DISKUS) 250-50 MCG/DOSE AEPB Inhale 1 puff into the lungs every 12 (twelve) hours.     . gabapentin (NEURONTIN) 300 MG capsule Take 1 capsule by mouth 3 (three) times daily.    [name] glipiZIDE (GLUCOTROL XL) 5 MG 24 hr tablet Take 10 mg by mouth 2 (two) times daily.     [name] guaiFENesin (MUCINEX) 600 MG 12 hr tablet Take 1 tablet (600 mg total) by mouth 2 (two) times daily. 10 tablet 0  . guaiFENesin-dextromethorphan (ROBITUSSIN DM) 100-10 MG/5ML syrup Take 5 mLs by mouth every 4 (four) hours as needed for cough. 118 mL 0  . levothyroxine (SYNTHROID) 100 MCG tablet Take 1 tablet (100 mcg total) by mouth daily before breakfast. 30 tablet 1  . metFORMIN (GLUCOPHAGE-XR) 500 MG 24 hr tablet Take 2 tablets by mouth 2 (two) times daily.    [name] omeprazole (PRILOSEC) 40 MG capsule Take 1 capsule by mouth daily.    . rosuvastatin (CRESTOR) 20 MG tablet Take 1 tablet (20 mg total) by mouth daily. 30 tablet 11  . sertraline (ZOLOFT) 25 MG tablet Take 50 mg by mouth daily.     [name] ZETIA 10 MG tablet Take 10 mg by mouth daily.     No current facility-administered medications for this visit.     SURGICAL HISTORY:  Past Surgical History:  Procedure Laterality Date  . CARDIAC CATHETERIZATION N/A 06/10/2015   Procedure: Left Heart Cath and Coronary Angiography;  Surgeon: Burnell Blanks, MD;  Location: [facility];  Service: Cardiovascular;  Laterality: N/A;  . LOWER EXTREMITY ANGIOGRAM N/A 07/13/2011   Procedure: LOWER EXTREMITY ANGIOGRAM;  Surgeon: Burnell Blanks, MD;  Location: [facility] CATH LAB;  Service: Cardiovascular;  Laterality: N/A;  . OTHER SURGICAL HISTORY    . OTHER SURGICAL HISTORY    . OTHER SURGICAL HISTORY    . PERCUTANEOUS STENT INTERVENTION Right 07/13/2011   Procedure: PERCUTANEOUS STENT INTERVENTION;  Surgeon: Burnell Blanks, MD;  Location: [facility] C[facility] LAB;  Service: Cardiovascular;  Laterality: Right;    REVIEW OF SYSTEMS:   Review of Systems  Constitutional:  Negative for appetite change, chills, fatigue, fever and unexpected weight change.  HENT:   Negative for mouth sores, nosebleeds, sore throat and trouble swallowing.   Eyes: Negative for eye problems and icterus.  Respiratory: Negative for cough, hemoptysis, shortness of breath and wheezing.   Cardiovascular: Negative for chest pain and leg swelling.  Gastrointestinal: Negative for abdominal pain, constipation, diarrhea, nausea and vomiting.  Genitourinary: Negative for bladder incontinence, difficulty urinating, dysuria, frequency and hematuria.   Musculoskeletal: Negative for back pain, gait problem, neck pain and neck stiffness.  Skin: Negative for itching and rash.  Neurological: Negative for dizziness, extremity weakness, gait problem, headaches, light-headedness and seizures.  Hematological: Negative for adenopathy. Does not bruise/bleed easily.  Psychiatric/Behavioral: Negative for confusion, depression and sleep disturbance. The patient is not nervous/anxious.     PHYSICAL EXAMINATION:  There were no vitals taken for this visit.  ECOG PERFORMANCE STATUS: {CHL ONC ECOG [id number]  Physical Exam  Constitutional: Oriented to person, place, and time and well-developed, well-nourished, and in no distress. No distress.  HENT:  Head: Normocephalic and atraumatic.  Mouth/Throat: Oropharynx is clear and moist. No oropharyngeal exudate.  Eyes: Conjunctivae are normal. Right eye exhibits no discharge. Left eye exhibits no discharge. No scleral icterus.  Neck: Normal range of motion. Neck supple.  Cardiovascular: Normal rate, regular rhythm, normal heart sounds and intact distal pulses.   Pulmonary/Chest: Effort normal and breath sounds normal. No respiratory distress. No wheezes. No rales.  Abdominal: Soft. Bowel sounds are normal. Exhibits no distension and no mass. There is no tenderness.  Musculoskeletal: Normal range of motion. Exhibits no edema.  Lymphadenopathy:    No cervical  adenopathy.  Neurological: Alert and oriented to person, place, and time. Exhibits normal muscle tone. Gait normal. Coordination normal.  Skin: Skin is warm and dry. No rash noted. Not diaphoretic. No erythema. No pallor.  Psychiatric: Mood, memory and judgment normal.  Vitals reviewed.  LABORATORY DATA: Lab Results  Component Value Date   WBC 7.0 11/19/2017   HGB 13.3 11/19/2017   HCT 39.0 11/19/2017   MCV 86.9 11/19/2017   PLT 212 11/19/2017      Chemistry      Component Value Date/Time   NA 136 11/21/2017 0509   NA 136 05/04/2017 1036   K 4.1 11/21/2017 0509   K 4.0 05/04/2017 1036   CL 107 11/21/2017 0509   CO2 23 11/21/2017 0509   CO2 16 (L) 05/04/2017 1036   BUN 18 11/21/2017 0509   BUN 15.5 05/04/2017 1036   CREATININE 1.16 11/21/2017 0509   CREATININE 1.1 05/04/2017 1036      Component Value Date/Time   CALCIUM 8.5 (L) 11/21/2017 0509   CALCIUM 9.6 05/04/2017 1036   ALKPHOS 62 11/21/2017 0509   ALKPHOS 93 05/04/2017 1036   AST 20 11/21/2017 0509   AST 13 05/04/2017 1036   ALT 10 11/21/2017 0509   ALT 7 05/04/2017 1036  BILITOT 0.6 11/21/2017 0509   BILITOT 0.56 05/04/2017 1036       RADIOGRAPHIC STUDIES:  Dg Chest 2 View  Result Date: 11/19/2017 CLINICAL DATA:  Lung cancer, altered mental status EXAM: CHEST - 2 VIEW COMPARISON:  CT chest 11/15/2017, chest radiograph 07/13/2017 FINDINGS: Normal heart size and pulmonary vascularity. Increased markings in the RIGHT perihilar region into the RIGHT middle and RIGHT lower lobes unchanged from the recent CT. No acute infiltrate, pleural effusion or pneumothorax. Bones unremarkable IMPRESSION: Chronic opacity in the RIGHT perihilar region extending in the RIGHT middle and RIGHT lower lobes corresponding to recent CT findings, question related to radiation therapy. No definite interval change. Electronically Signed   By: Lavonia Dana M.D.   On: 11/19/2017 20:56   Ct Head Wo Contrast  Result Date: 11/19/2017 CLINICAL  DATA:  Altered level of consciousness, unexplained. EXAM: CT HEAD WITHOUT CONTRAST TECHNIQUE: Contiguous axial images were obtained from the base of the skull through the vertex without intravenous contrast. COMPARISON:  Head CT dated 01/28/2010 and brain MRI dated 11/28/2016. FINDINGS: Brain: Mild generalized age related volume loss with commensurate dilatation of the ventricles and sulci. Mild chronic small vessel ischemic changes again noted within the bilateral periventricular white matter regions. There is no mass, hemorrhage, edema or other evidence of acute parenchymal abnormality. No extra-axial hemorrhage. Vascular: There are chronic calcified atherosclerotic changes of the large vessels at the skull base. No unexpected hyperdense vessel. Skull: Normal. Negative for fracture or focal lesion. Sinuses/Orbits: No acute finding. Other: None. IMPRESSION: 1. No acute findings.  No intracranial mass, hemorrhage or edema. 2. Mild chronic small vessel ischemic changes in the white matter. Electronically Signed   By: Franki Cabot M.D.   On: 11/19/2017 16:57   Ct Chest W Contrast  Result Date: 11/16/2017 CLINICAL DATA:  Non-small-cell lung cancer. EXAM: CT CHEST WITH CONTRAST TECHNIQUE: Multidetector CT imaging of the chest was performed during intravenous contrast administration. CONTRAST:  28mL OMNIPAQUE IOHEXOL 300 MG/ML  SOLN COMPARISON:  07/05/2017 FINDINGS: Cardiovascular: Heart size normal. Trace anterior pericardial fluid evident. Coronary artery calcification is evident. Atherosclerotic calcification is noted in the wall of the thoracic aorta. Mediastinum/Nodes: 11 mm subcarinal lymph node measured previously is stable at 11 mm today. No left hilar lymphadenopathy. Post radiation scarring noted in the right hilum and parahilar lung. Fluid in the mid esophagus compatible with reflux or dysmotility. Lungs/Pleura: The central tracheobronchial airways are patent. Centrilobular emphysema. Evolution of  radiation scarring in the medial right lung. Nodular component in the anterior right lower lobe (5:96) is progressive in the interval and while likely scarring related, attention on follow-up recommended. No new suspicious pulmonary nodule or mass. No pleural effusion. Upper Abdomen: Unremarkable Musculoskeletal: No worrisome lytic or sclerotic osseous abnormality. IMPRESSION: 1. Interval evolution of radiation changes in the medial right lung. Nodular component to these changes in the anterior right lower lobe is most likely evolving scar but continued attention on follow-up recommended. 2.  Emphysema. (ICD10-J43.9) 3.  Aortic Atherosclerois (ICD10-170.0) Electronically Signed   By: Misty Stanley M.D.   On: 11/16/2017 07:19   Mr Brain Wo Contrast  Result Date: 11/20/2017 CLINICAL DATA:  Slurred speech. Weakness of both lower extremities. History of stage III lung cancer. EXAM: MRI HEAD WITHOUT CONTRAST MRA HEAD WITHOUT CONTRAST TECHNIQUE: Multiplanar, multiecho pulse sequences of the brain and surrounding structures were obtained without intravenous contrast. Angiographic images of the head were obtained using MRA technique without contrast. COMPARISON:  CT 11/19/2017.  MRI 11/28/2016.  FINDINGS: MRI HEAD FINDINGS Brain: Diffusion imaging does not show any acute or subacute infarction or other cause of restricted diffusion. Brainstem and cerebellum are normal except for a small vessel infarction in the left cerebellum. Cerebral hemispheres show minimal small vessel change of the white matter. No cortical or large vessel territory infarction. No sign of mass lesion, or hydrocephalus. At the left parieto-occipital convexity, there is a thin rim of subdural signal, 1-2 mm in thickness, that could represent minimal residual from a previous left subdural. This does not have any mass effect. Without contrast, tiny metastasis could be inapparent. Vascular: Major vessels at the base of the brain show flow. The right  vertebral artery is a large vessel that primarily terminates in PICA, with only a minimal contribution to the basilar. Skull and upper cervical spine: Negative Sinuses/Orbits: Clear/normal Other: None MRA HEAD FINDINGS Both internal carotid arteries are widely patent into the brain. No siphon stenosis. The anterior and middle cerebral vessels are patent without proximal stenosis, aneurysm or vascular malformation. As noted above, the left vertebral artery is dominant, widely patent to the basilar. The right vertebral artery largely terminates in PICA, with only a tiny contribution to the basilar. No basilar stenosis. Posterior circulation branch vessels appear normal. IMPRESSION: No sign of acute or subacute infarction. No sign of metastatic disease. Tiny metastasis could be inapparent without contrast, but there is no finding to raise that concern. Very small amount of subdural signal visible on FLAIR imaging at the left parieto-occipital convexity could represent a minimal residual from previous subdural hematoma. This does not have any mass effect. Normal intracranial MR angiography of the large and medium size vessels. Electronically Signed   By: Nelson Chimes M.D.   On: 11/20/2017 09:50   US Carotid Bilateral (at [facility] And [facility] Only)  Result Date: 11/20/2017 CLINICAL DATA:  Slurred speech and altered mental status EXAM: BILATERAL CAROTID DUPLEX ULTRASOUND TECHNIQUE: Pearline Cables scale imaging, color Doppler and duplex ultrasound were performed of bilateral carotid and vertebral arteries in the neck. COMPARISON:  11/20/2017 FINDINGS: Criteria: Quantification of carotid stenosis is based on velocity parameters that correlate the residual internal carotid diameter with NASCET-based stenosis levels, using the diameter of the distal internal carotid lumen as the denominator for stenosis measurement. The following velocity measurements were obtained: RIGHT ICA: 84/27 cm/sec CCA: 03/50 cm/sec SYSTOLIC ICA/CCA RATIO:  0.93 ECA:   174 cm/sec LEFT ICA: 103/32 cm/sec CCA: 81/82 cm/sec SYSTOLIC ICA/CCA RATIO:  1.1 ECA:  131 cm/sec RIGHT CAROTID ARTERY: Moderate carotid intimal thickening and heterogeneous atherosclerosis despite this, there is no hemodynamically significant ICA stenosis, velocity elevation, or turbulent flow. Degree of narrowing estimated less than 50% by ultrasound criteria. RIGHT VERTEBRAL ARTERY: Small in caliber and difficult to visualize but appears antegrade LEFT CAROTID ARTERY: Similar moderate carotid intimal thickening and heterogeneous partially calcified atherosclerosis. Despite this, there is no hemodynamically significant left ICA stenosis, velocity elevation, or turbulent flow. Degree of narrowing also estimated less than 50% by ultrasound criteria. LEFT VERTEBRAL ARTERY:  Antegrade IMPRESSION: Moderate carotid atherosclerosis and intimal thickening. No hemodynamically significant ICA stenosis by ultrasound criteria. Degree of narrowing estimated at less than 50% bilaterally. Antegrade vertebral flow bilaterally, right vertebral artery appears very small in caliber and difficult to visualize. Electronically Signed   By: Jerilynn Mages.  Shick M.D.   On: 11/20/2017 09:51   Mr Jodene Nam Head/brain XH Cm  Result Date: 11/20/2017 CLINICAL DATA:  Slurred speech. Weakness of both lower extremities. History of stage III lung cancer. EXAM:  MRI HEAD WITHOUT CONTRAST MRA HEAD WITHOUT CONTRAST TECHNIQUE: Multiplanar, multiecho pulse sequences of the brain and surrounding structures were obtained without intravenous contrast. Angiographic images of the head were obtained using MRA technique without contrast. COMPARISON:  CT 11/19/2017.  MRI 11/28/2016. FINDINGS: MRI HEAD FINDINGS Brain: Diffusion imaging does not show any acute or subacute infarction or other cause of restricted diffusion. Brainstem and cerebellum are normal except for a small vessel infarction in the left cerebellum. Cerebral hemispheres show minimal small vessel change of  the white matter. No cortical or large vessel territory infarction. No sign of mass lesion, or hydrocephalus. At the left parieto-occipital convexity, there is a thin rim of subdural signal, 1-2 mm in thickness, that could represent minimal residual from a previous left subdural. This does not have any mass effect. Without contrast, tiny metastasis could be inapparent. Vascular: Major vessels at the base of the brain show flow. The right vertebral artery is a large vessel that primarily terminates in PICA, with only a minimal contribution to the basilar. Skull and upper cervical spine: Negative Sinuses/Orbits: Clear/normal Other: None MRA HEAD FINDINGS Both internal carotid arteries are widely patent into the brain. No siphon stenosis. The anterior and middle cerebral vessels are patent without proximal stenosis, aneurysm or vascular malformation. As noted above, the left vertebral artery is dominant, widely patent to the basilar. The right vertebral artery largely terminates in PICA, with only a tiny contribution to the basilar. No basilar stenosis. Posterior circulation branch vessels appear normal. IMPRESSION: No sign of acute or subacute infarction. No sign of metastatic disease. Tiny metastasis could be inapparent without contrast, but there is no finding to raise that concern. Very small amount of subdural signal visible on FLAIR imaging at the left parieto-occipital convexity could represent a minimal residual from previous subdural hematoma. This does not have any mass effect. Normal intracranial MR angiography of the large and medium size vessels. Electronically Signed   By: Nelson Chimes M.D.   On: 11/20/2017 09:50   US Abdomen Limited Ruq  Result Date: 11/20/2017 CLINICAL DATA:  Mild right upper quadrant pain and epigastric tenderness EXAM: ULTRASOUND ABDOMEN LIMITED RIGHT UPPER QUADRANT COMPARISON:  CT AP 07/13/2017 FINDINGS: Gallbladder: No gallstones. Gallbladder wall is upper limits of normal in  thickness measuring 3 mm. No sonographic Murphy sign noted by sonographer. Common bile duct: Diameter: 3 mm Liver: No focal lesion identified. Within normal limits in parenchymal echogenicity. Portal vein is patent on color Doppler imaging with normal direction of blood flow towards the liver. IMPRESSION: 1. The gallbladder wall thickness is upper limits of normal at 3 mm. The gallbladder is otherwise normal without stones or sonographic Murphy's sign. Electronically Signed   By: Kerby Moors M.D.   On: 11/20/2017 09:49     ASSESSMENT/PLAN:  No problem-specific Assessment & Plan notes found for this encounter.  No orders of the defined types were placed in this encounter.  Mikey Bussing, DNP, AGPCNP-BC, AOCNP 11/29/17

## 2017-11-30 ENCOUNTER — Telehealth: Payer: Self-pay | Admitting: Internal Medicine

## 2017-11-30 ENCOUNTER — Inpatient Hospital Stay: Payer: Medicare HMO

## 2017-11-30 ENCOUNTER — Telehealth: Payer: Self-pay | Admitting: *Deleted

## 2017-11-30 ENCOUNTER — Inpatient Hospital Stay: Payer: Medicare HMO | Admitting: Oncology

## 2017-11-30 NOTE — Telephone Encounter (Signed)
7/18 sch message - cancelled appt per message - left vmail for patient with confirmation.,

## 2017-11-30 NOTE — Telephone Encounter (Signed)
Pt called "I;m not going to be able to make it today"message to scheduling

## 2017-12-04 DIAGNOSIS — K219 Gastro-esophageal reflux disease without esophagitis: Secondary | ICD-10-CM | POA: Diagnosis not present

## 2017-12-04 DIAGNOSIS — R69 Illness, unspecified: Secondary | ICD-10-CM | POA: Diagnosis not present

## 2017-12-04 DIAGNOSIS — E1165 Type 2 diabetes mellitus with hyperglycemia: Secondary | ICD-10-CM | POA: Diagnosis not present

## 2017-12-04 DIAGNOSIS — I10 Essential (primary) hypertension: Secondary | ICD-10-CM | POA: Diagnosis not present

## 2017-12-04 DIAGNOSIS — J449 Chronic obstructive pulmonary disease, unspecified: Secondary | ICD-10-CM | POA: Diagnosis not present

## 2017-12-04 DIAGNOSIS — E039 Hypothyroidism, unspecified: Secondary | ICD-10-CM | POA: Diagnosis not present

## 2017-12-04 DIAGNOSIS — C3491 Malignant neoplasm of unspecified part of right bronchus or lung: Secondary | ICD-10-CM | POA: Diagnosis not present

## 2017-12-04 DIAGNOSIS — Z8673 Personal history of transient ischemic attack (TIA), and cerebral infarction without residual deficits: Secondary | ICD-10-CM | POA: Diagnosis not present

## 2017-12-04 DIAGNOSIS — E782 Mixed hyperlipidemia: Secondary | ICD-10-CM | POA: Diagnosis not present

## 2017-12-04 DIAGNOSIS — E1142 Type 2 diabetes mellitus with diabetic polyneuropathy: Secondary | ICD-10-CM | POA: Diagnosis not present

## 2017-12-14 ENCOUNTER — Inpatient Hospital Stay: Payer: Medicare HMO

## 2017-12-14 ENCOUNTER — Telehealth: Payer: Self-pay

## 2017-12-14 ENCOUNTER — Inpatient Hospital Stay (HOSPITAL_BASED_OUTPATIENT_CLINIC_OR_DEPARTMENT_OTHER): Payer: Medicare HMO | Admitting: Oncology

## 2017-12-14 ENCOUNTER — Inpatient Hospital Stay: Payer: Medicare HMO | Attending: Oncology

## 2017-12-14 ENCOUNTER — Encounter: Payer: Self-pay | Admitting: Oncology

## 2017-12-14 VITALS — BP 127/61 | HR 86 | Temp 97.8°F | Resp 18 | Ht 69.0 in | Wt 158.5 lb

## 2017-12-14 DIAGNOSIS — E039 Hypothyroidism, unspecified: Secondary | ICD-10-CM

## 2017-12-14 DIAGNOSIS — F17200 Nicotine dependence, unspecified, uncomplicated: Secondary | ICD-10-CM

## 2017-12-14 DIAGNOSIS — Z5112 Encounter for antineoplastic immunotherapy: Secondary | ICD-10-CM

## 2017-12-14 DIAGNOSIS — C3491 Malignant neoplasm of unspecified part of right bronchus or lung: Secondary | ICD-10-CM

## 2017-12-14 DIAGNOSIS — C342 Malignant neoplasm of middle lobe, bronchus or lung: Secondary | ICD-10-CM | POA: Insufficient documentation

## 2017-12-14 DIAGNOSIS — R5382 Chronic fatigue, unspecified: Secondary | ICD-10-CM

## 2017-12-14 LAB — COMPREHENSIVE METABOLIC PANEL
ALK PHOS: 91 U/L (ref 38–126)
ALT: 12 U/L (ref 0–44)
AST: 22 U/L (ref 15–41)
Albumin: 4.4 g/dL (ref 3.5–5.0)
Anion gap: 10 (ref 5–15)
BUN: 22 mg/dL (ref 8–23)
CALCIUM: 10.2 mg/dL (ref 8.9–10.3)
CO2: 22 mmol/L (ref 22–32)
CREATININE: 1.15 mg/dL (ref 0.61–1.24)
Chloride: 106 mmol/L (ref 98–111)
GFR calc non Af Amer: >60 mL/min (ref >60)
Glucose, Bld: 148 mg/dL — ABNORMAL HIGH (ref 70–99)
Potassium: 4.6 mmol/L (ref 3.5–5.1)
Sodium: 138 mmol/L (ref 135–145)
Total Bilirubin: 0.4 mg/dL (ref 0.3–1.2)
Total Protein: 7.6 g/dL (ref 6.5–8.1)

## 2017-12-14 LAB — TSH: TSH: 16.123 u[IU]/mL — ABNORMAL HIGH (ref 0.320–4.118)

## 2017-12-14 LAB — CBC WITH DIFFERENTIAL/PLATELET
Basophils Absolute: 0.1 10*3/uL (ref 0.0–0.1)
Basophils Relative: 1 %
EOS PCT: 3 %
Eosinophils Absolute: 0.1 10*3/uL (ref 0.0–0.5)
HCT: 39.4 % (ref 38.4–49.9)
Hemoglobin: 13.3 g/dL (ref 13.0–17.1)
LYMPHS ABS: 0.7 10*3/uL — AB (ref 0.9–3.3)
LYMPHS PCT: 14 %
MCH: 29.5 pg (ref 27.2–33.4)
MCHC: 33.8 g/dL (ref 32.0–36.0)
MCV: 87.4 fL (ref 79.3–98.0)
Monocytes Absolute: 0.3 10*3/uL (ref 0.1–0.9)
Monocytes Relative: 5 %
Neutro Abs: 4.2 10*3/uL (ref 1.5–6.5)
Neutrophils Relative %: 77 %
Platelets: 158 10*3/uL (ref 140–400)
RBC: 4.51 MIL/uL (ref 4.20–5.82)
RDW: 15.5 % — ABNORMAL HIGH (ref 11.0–14.6)
WBC: 5.4 10*3/uL (ref 4.0–10.3)

## 2017-12-14 MED ORDER — SODIUM CHLORIDE 0.9 % IV SOLN
10.4000 mg/kg | Freq: Once | INTRAVENOUS | Status: AC
  Administered 2017-12-14: 740 mg via INTRAVENOUS
  Filled 2017-12-14: qty 10

## 2017-12-14 MED ORDER — SODIUM CHLORIDE 0.9 % IV SOLN
Freq: Once | INTRAVENOUS | Status: AC
Start: 2017-12-14 — End: 2017-12-14
  Administered 2017-12-14: 15:00:00 via INTRAVENOUS
  Filled 2017-12-14: qty 250

## 2017-12-14 NOTE — Patient Instructions (Signed)
[facility] Discharge Instructions for Patients Receiving Chemotherapy  Today you received the following chemotherapy agents: Imfinzi.  To help prevent nausea and vomiting after your treatment, we encourage you to take your nausea medication as directed.   If you develop nausea and vomiting that is not controlled by your nausea medication, call the clinic.   BELOW ARE SYMPTOMS THAT SHOULD BE REPORTED IMMEDIATELY:  *FEVER GREATER THAN 100.5 F  *CHILLS WITH OR WITHOUT FEVER  NAUSEA AND VOMITING THAT IS NOT CONTROLLED WITH YOUR NAUSEA MEDICATION  *UNUSUAL SHORTNESS OF BREATH  *UNUSUAL BRUISING OR BLEEDING  TENDERNESS IN MOUTH AND THROAT WITH OR WITHOUT PRESENCE OF ULCERS  *URINARY PROBLEMS  *BOWEL PROBLEMS  UNUSUAL RASH Items with * indicate a potential emergency and should be followed up as soon as possible.  Feel free to call the clinic should you have any questions or concerns. The clinic phone number is [phone].  Please show the CHEMO ALERT CARD at check-in to the Emergency Department and triage nurse.   

## 2017-12-15 NOTE — Progress Notes (Signed)
[facility] OFFICE PROGRESS NOTE  Matthew Sacramento, MD [address] [place] [address]  DIAGNOSIS: Stage IIIA (T1a, N2, M0) non-small cell lung cancer, squamous cell carcinoma presented with right middle lobe pulmonary nodule in addition to ipsilateral hilar and ipsilateral mediastinal lymphadenopathy diagnosed in June 2018.  PRIOR THERAPY: A course of concurrent chemoradiation with weekly carboplatin for AUC of 2 and paclitaxel 45 MG/M2. Status post 7cycles.  CURRENT THERAPY: Consolidation immunotherapy with Imfinzi (Durvalumab) 10 MG/KG every 2 weeks.first dose 02/22/2017. Status post 14cycles.  INTERVAL HISTORY: Matthew Wilkins 72 y.o. male returns for routine follow-up visit accompanied by his daughter.  Patient is feeling fine today and has no specific complaints.  The patient missed his appointment 2 weeks ago.  Following that, he was admitted to the hospital for possible TIA, dehydration, and UTI.  He is feeling better now.  The patient denies fevers and chills.  Denies chest pain, cough, hemoptysis.  He has baseline shortness of breath with exertion.  Denies nausea, vomiting, constipation, diarrhea.  Denies recent weight loss or night sweats.  The patient had a restaging CT scan on the day of his last visit.  It was not available at the time of his visit so it has not yet been reviewed with him.  The patient is here for evaluation prior to cycle #15 of his treatment and to review his restaging CT scan of the chest.  MEDICAL HISTORY: Past Medical History:  Diagnosis Date  . Adenocarcinoma of right lung, stage 3 ([facility]) 11/17/2016  . Anxiety   . Arthritis   . Coronary artery disease   . Diabetes mellitus   . DVT (deep venous thrombosis) ([facility])   . Encounter for antineoplastic chemotherapy 11/17/2016  . History of radiation therapy 11/28/16-01/06/17   right lung was treated to 60 Gy in 30 fractions  . Hypercholesterolemia   . Hypertension   . PAD (peripheral artery  disease) ([facility])   . SOB (shortness of breath)     ALLERGIES:  is allergic to aspirin.  MEDICATIONS:  Current Outpatient Medications  Medication Sig Dispense Refill  . albuterol (PROVENTIL HFA;VENTOLIN HFA) 108 (90 Base) MCG/ACT inhaler Inhale 1-2 puffs into the lungs every 6 (six) hours as needed for wheezing or shortness of breath.     [name] apixaban (ELIQUIS) 5 MG TABS tablet Take 1 tablet (5 mg total) by mouth 2 (two) times daily. 60 tablet 0  . dicyclomine (BENTYL) 20 MG tablet Take 1 tablet by mouth 3 (three) times daily.    . Fluticasone-Salmeterol (ADVAIR DISKUS) 250-50 MCG/DOSE AEPB Inhale 1 puff into the lungs every 12 (twelve) hours.     . gabapentin (NEURONTIN) 300 MG capsule Take 1 capsule by mouth 3 (three) times daily.    [name] glipiZIDE (GLUCOTROL XL) 5 MG 24 hr tablet Take 10 mg by mouth 2 (two) times daily.     [name] guaiFENesin (MUCINEX) 600 MG 12 hr tablet Take 1 tablet (600 mg total) by mouth 2 (two) times daily. 10 tablet 0  . guaiFENesin-dextromethorphan (ROBITUSSIN DM) 100-10 MG/5ML syrup Take 5 mLs by mouth every 4 (four) hours as needed for cough. 118 mL 0  . levothyroxine (SYNTHROID) 100 MCG tablet Take 1 tablet (100 mcg total) by mouth daily before breakfast. 30 tablet 1  . metFORMIN (GLUCOPHAGE-XR) 500 MG 24 hr tablet Take 2 tablets by mouth 2 (two) times daily.    [name] omeprazole (PRILOSEC) 40 MG capsule Take 1 capsule by mouth daily.    [name]  rosuvastatin (CRESTOR) 20 MG tablet Take 1 tablet (20 mg total) by mouth daily. 30 tablet 11  . sertraline (ZOLOFT) 25 MG tablet Take 50 mg by mouth daily.     [name] ZETIA 10 MG tablet Take 10 mg by mouth daily.    . feeding supplement, GLUCERNA SHAKE, (GLUCERNA SHAKE) LIQD Take 237 mLs by mouth 3 (three) times daily between meals. (Patient not taking: Reported on 12/14/2017) 90 Can 0   No current facility-administered medications for this visit.     SURGICAL HISTORY:  Past Surgical History:  Procedure Laterality Date  . CARDIAC  CATHETERIZATION N/A 06/10/2015   Procedure: Left Heart Cath and Coronary Angiography;  Surgeon: Burnell Blanks, MD;  Location: [facility];  Service: Cardiovascular;  Laterality: N/A;  . LOWER EXTREMITY ANGIOGRAM N/A 07/13/2011   Procedure: LOWER EXTREMITY ANGIOGRAM;  Surgeon: Burnell Blanks, MD;  Location: [facility] CATH LAB;  Service: Cardiovascular;  Laterality: N/A;  . OTHER SURGICAL HISTORY    . OTHER SURGICAL HISTORY    . OTHER SURGICAL HISTORY    . PERCUTANEOUS STENT INTERVENTION Right 07/13/2011   Procedure: PERCUTANEOUS STENT INTERVENTION;  Surgeon: Burnell Blanks, MD;  Location: [facility] CATH LAB;  Service: Cardiovascular;  Laterality: Right;    REVIEW OF SYSTEMS:   Review of Systems  Constitutional: Negative for appetite change, chills, fatigue, fever and unexpected weight change.  HENT:   Negative for mouth sores, nosebleeds, sore throat and trouble swallowing.   Eyes: Negative for eye problems and icterus.  Respiratory: Negative for cough, hemoptysis, and wheezing.  Positive for shortness of breath with exertion.  Cardiovascular: Negative for chest pain and leg swelling.  Gastrointestinal: Negative for abdominal pain, constipation, diarrhea, nausea and vomiting.  Genitourinary: Negative for bladder incontinence, difficulty urinating, dysuria, frequency and hematuria.   Musculoskeletal: Negative for back pain, gait problem, neck pain and neck stiffness.  Skin: Negative for itching and rash.  Neurological: Negative for dizziness, extremity weakness, gait problem, headaches, light-headedness and seizures.  Hematological: Negative for adenopathy. Does not bruise/bleed easily.  Psychiatric/Behavioral: Negative for confusion, depression and sleep disturbance. The patient is not nervous/anxious.     PHYSICAL EXAMINATION:  Blood pressure 127/61, pulse 86, temperature 97.8 F (36.6 C), temperature source Oral, resp. rate 18, height 5\' 9"  (1.753 m), weight 158 lb 8 oz  (71.9 kg), SpO2 100 %.  ECOG PERFORMANCE STATUS: 1 - Symptomatic but completely ambulatory  Physical Exam  Constitutional: Oriented to person, place, and time and well-developed, well-nourished, and in no distress. No distress.  HENT:  Head: Normocephalic and atraumatic.  Mouth/Throat: Oropharynx is clear and moist. No oropharyngeal exudate.  Eyes: Conjunctivae are normal. Right eye exhibits no discharge. Left eye exhibits no discharge. No scleral icterus.  Neck: Normal range of motion. Neck supple.  Cardiovascular: Normal rate, regular rhythm, normal heart sounds and intact distal pulses.   Pulmonary/Chest: Effort normal and breath sounds normal. No respiratory distress. No wheezes. No rales.  Abdominal: Soft. Bowel sounds are normal. Exhibits no distension and no mass. There is no tenderness.  Musculoskeletal: Normal range of motion. Exhibits no edema.  Lymphadenopathy:    No cervical adenopathy.  Neurological: Alert and oriented to person, place, and time. Exhibits normal muscle tone. Gait normal. Coordination normal.  Skin: Skin is warm and dry. No rash noted. Not diaphoretic. No erythema. No pallor.  Psychiatric: Mood, memory and judgment normal.  Vitals reviewed.  LABORATORY DATA: Lab Results  Component Value Date   WBC 5.4 12/14/2017  HGB 13.3 12/14/2017   HCT 39.4 12/14/2017   MCV 87.4 12/14/2017   PLT 158 12/14/2017      Chemistry      Component Value Date/Time   NA 138 12/14/2017 1326   NA 136 05/04/2017 1036   K 4.6 12/14/2017 1326   K 4.0 05/04/2017 1036   CL 106 12/14/2017 1326   CO2 22 12/14/2017 1326   CO2 16 (L) 05/04/2017 1036   BUN 22 12/14/2017 1326   BUN 15.5 05/04/2017 1036   CREATININE 1.15 12/14/2017 1326   CREATININE 1.1 05/04/2017 1036      Component Value Date/Time   CALCIUM 10.2 12/14/2017 1326   CALCIUM 9.6 05/04/2017 1036   ALKPHOS 91 12/14/2017 1326   ALKPHOS 93 05/04/2017 1036   AST 22 12/14/2017 1326   AST 13 05/04/2017 1036   ALT  12 12/14/2017 1326   ALT 7 05/04/2017 1036   BILITOT 0.4 12/14/2017 1326   BILITOT 0.56 05/04/2017 1036       RADIOGRAPHIC STUDIES:  Dg Chest 2 View  Result Date: 11/19/2017 CLINICAL DATA:  Lung cancer, altered mental status EXAM: CHEST - 2 VIEW COMPARISON:  CT chest 11/15/2017, chest radiograph 07/13/2017 FINDINGS: Normal heart size and pulmonary vascularity. Increased markings in the RIGHT perihilar region into the RIGHT middle and RIGHT lower lobes unchanged from the recent CT. No acute infiltrate, pleural effusion or pneumothorax. Bones unremarkable IMPRESSION: Chronic opacity in the RIGHT perihilar region extending in the RIGHT middle and RIGHT lower lobes corresponding to recent CT findings, question related to radiation therapy. No definite interval change. Electronically Signed   By: Lavonia Dana M.D.   On: 11/19/2017 20:56   Ct Head Wo Contrast  Result Date: 11/19/2017 CLINICAL DATA:  Altered level of consciousness, unexplained. EXAM: CT HEAD WITHOUT CONTRAST TECHNIQUE: Contiguous axial images were obtained from the base of the skull through the vertex without intravenous contrast. COMPARISON:  Head CT dated 01/28/2010 and brain MRI dated 11/28/2016. FINDINGS: Brain: Mild generalized age related volume loss with commensurate dilatation of the ventricles and sulci. Mild chronic small vessel ischemic changes again noted within the bilateral periventricular white matter regions. There is no mass, hemorrhage, edema or other evidence of acute parenchymal abnormality. No extra-axial hemorrhage. Vascular: There are chronic calcified atherosclerotic changes of the large vessels at the skull base. No unexpected hyperdense vessel. Skull: Normal. Negative for fracture or focal lesion. Sinuses/Orbits: No acute finding. Other: None. IMPRESSION: 1. No acute findings.  No intracranial mass, hemorrhage or edema. 2. Mild chronic small vessel ischemic changes in the white matter. Electronically Signed   By: Franki Cabot M.D.   On: 11/19/2017 16:57   Ct Chest W Contrast  Result Date: 11/16/2017 CLINICAL DATA:  Non-small-cell lung cancer. EXAM: CT CHEST WITH CONTRAST TECHNIQUE: Multidetector CT imaging of the chest was performed during intravenous contrast administration. CONTRAST:  59mL OMNIPAQUE IOHEXOL 300 MG/ML  SOLN COMPARISON:  07/05/2017 FINDINGS: Cardiovascular: Heart size normal. Trace anterior pericardial fluid evident. Coronary artery calcification is evident. Atherosclerotic calcification is noted in the wall of the thoracic aorta. Mediastinum/Nodes: 11 mm subcarinal lymph node measured previously is stable at 11 mm today. No left hilar lymphadenopathy. Post radiation scarring noted in the right hilum and parahilar lung. Fluid in the mid esophagus compatible with reflux or dysmotility. Lungs/Pleura: The central tracheobronchial airways are patent. Centrilobular emphysema. Evolution of radiation scarring in the medial right lung. Nodular component in the anterior right lower lobe (5:96) is progressive in the interval  and while likely scarring related, attention on follow-up recommended. No new suspicious pulmonary nodule or mass. No pleural effusion. Upper Abdomen: Unremarkable Musculoskeletal: No worrisome lytic or sclerotic osseous abnormality. IMPRESSION: 1. Interval evolution of radiation changes in the medial right lung. Nodular component to these changes in the anterior right lower lobe is most likely evolving scar but continued attention on follow-up recommended. 2.  Emphysema. (ICD10-J43.9) 3.  Aortic Atherosclerois (ICD10-170.0) Electronically Signed   By: Misty Stanley M.D.   On: 11/16/2017 07:19   Mr Brain Wo Contrast  Result Date: 11/20/2017 CLINICAL DATA:  Slurred speech. Weakness of both lower extremities. History of stage III lung cancer. EXAM: MRI HEAD WITHOUT CONTRAST MRA HEAD WITHOUT CONTRAST TECHNIQUE: Multiplanar, multiecho pulse sequences of the brain and surrounding structures were  obtained without intravenous contrast. Angiographic images of the head were obtained using MRA technique without contrast. COMPARISON:  CT 11/19/2017.  MRI 11/28/2016. FINDINGS: MRI HEAD FINDINGS Brain: Diffusion imaging does not show any acute or subacute infarction or other cause of restricted diffusion. Brainstem and cerebellum are normal except for a small vessel infarction in the left cerebellum. Cerebral hemispheres show minimal small vessel change of the white matter. No cortical or large vessel territory infarction. No sign of mass lesion, or hydrocephalus. At the left parieto-occipital convexity, there is a thin rim of subdural signal, 1-2 mm in thickness, that could represent minimal residual from a previous left subdural. This does not have any mass effect. Without contrast, tiny metastasis could be inapparent. Vascular: Major vessels at the base of the brain show flow. The right vertebral artery is a large vessel that primarily terminates in PICA, with only a minimal contribution to the basilar. Skull and upper cervical spine: Negative Sinuses/Orbits: Clear/normal Other: None MRA HEAD FINDINGS Both internal carotid arteries are widely patent into the brain. No siphon stenosis. The anterior and middle cerebral vessels are patent without proximal stenosis, aneurysm or vascular malformation. As noted above, the left vertebral artery is dominant, widely patent to the basilar. The right vertebral artery largely terminates in PICA, with only a tiny contribution to the basilar. No basilar stenosis. Posterior circulation branch vessels appear normal. IMPRESSION: No sign of acute or subacute infarction. No sign of metastatic disease. Tiny metastasis could be inapparent without contrast, but there is no finding to raise that concern. Very small amount of subdural signal visible on FLAIR imaging at the left parieto-occipital convexity could represent a minimal residual from previous subdural hematoma. This does not  have any mass effect. Normal intracranial MR angiography of the large and medium size vessels. Electronically Signed   By: Nelson Chimes M.D.   On: 11/20/2017 09:50   US Carotid Bilateral (at [facility] Only)  Result Date: 11/20/2017 CLINICAL DATA:  Slurred speech and altered mental status EXAM: BILATERAL CAROTID DUPLEX ULTRASOUND TECHNIQUE: Pearline Cables scale imaging, color Doppler and duplex ultrasound were performed of bilateral carotid and vertebral arteries in the neck. COMPARISON:  11/20/2017 FINDINGS: Criteria: Quantification of carotid stenosis is based on velocity parameters that correlate the residual internal carotid diameter with NASCET-based stenosis levels, using the diameter of the distal internal carotid lumen as the denominator for stenosis measurement. The following velocity measurements were obtained: RIGHT ICA: 84/27 cm/sec CCA: 66/29 cm/sec SYSTOLIC ICA/CCA RATIO:  4.76 ECA:  174 cm/sec LEFT ICA: 103/32 cm/sec CCA: 54/65 cm/sec SYSTOLIC ICA/CCA RATIO:  1.1 ECA:  131 cm/sec RIGHT CAROTID ARTERY: Moderate carotid intimal thickening and heterogeneous atherosclerosis despite this, there is no hemodynamically  significant ICA stenosis, velocity elevation, or turbulent flow. Degree of narrowing estimated less than 50% by ultrasound criteria. RIGHT VERTEBRAL ARTERY: Small in caliber and difficult to visualize but appears antegrade LEFT CAROTID ARTERY: Similar moderate carotid intimal thickening and heterogeneous partially calcified atherosclerosis. Despite this, there is no hemodynamically significant left ICA stenosis, velocity elevation, or turbulent flow. Degree of narrowing also estimated less than 50% by ultrasound criteria. LEFT VERTEBRAL ARTERY:  Antegrade IMPRESSION: Moderate carotid atherosclerosis and intimal thickening. No hemodynamically significant ICA stenosis by ultrasound criteria. Degree of narrowing estimated at less than 50% bilaterally. Antegrade vertebral flow bilaterally, right  vertebral artery appears very small in caliber and difficult to visualize. Electronically Signed   By: Jerilynn Mages.  Shick M.D.   On: 11/20/2017 09:51   Mr Jodene Nam Head/brain PX Cm  Result Date: 11/20/2017 CLINICAL DATA:  Slurred speech. Weakness of both lower extremities. History of stage III lung cancer. EXAM: MRI HEAD WITHOUT CONTRAST MRA HEAD WITHOUT CONTRAST TECHNIQUE: Multiplanar, multiecho pulse sequences of the brain and surrounding structures were obtained without intravenous contrast. Angiographic images of the head were obtained using MRA technique without contrast. COMPARISON:  CT 11/19/2017.  MRI 11/28/2016. FINDINGS: MRI HEAD FINDINGS Brain: Diffusion imaging does not show any acute or subacute infarction or other cause of restricted diffusion. Brainstem and cerebellum are normal except for a small vessel infarction in the left cerebellum. Cerebral hemispheres show minimal small vessel change of the white matter. No cortical or large vessel territory infarction. No sign of mass lesion, or hydrocephalus. At the left parieto-occipital convexity, there is a thin rim of subdural signal, 1-2 mm in thickness, that could represent minimal residual from a previous left subdural. This does not have any mass effect. Without contrast, tiny metastasis could be inapparent. Vascular: Major vessels at the base of the brain show flow. The right vertebral artery is a large vessel that primarily terminates in PICA, with only a minimal contribution to the basilar. Skull and upper cervical spine: Negative Sinuses/Orbits: Clear/normal Other: None MRA HEAD FINDINGS Both internal carotid arteries are widely patent into the brain. No siphon stenosis. The anterior and middle cerebral vessels are patent without proximal stenosis, aneurysm or vascular malformation. As noted above, the left vertebral artery is dominant, widely patent to the basilar. The right vertebral artery largely terminates in PICA, with only a tiny contribution to the  basilar. No basilar stenosis. Posterior circulation branch vessels appear normal. IMPRESSION: No sign of acute or subacute infarction. No sign of metastatic disease. Tiny metastasis could be inapparent without contrast, but there is no finding to raise that concern. Very small amount of subdural signal visible on FLAIR imaging at the left parieto-occipital convexity could represent a minimal residual from previous subdural hematoma. This does not have any mass effect. Normal intracranial MR angiography of the large and medium size vessels. Electronically Signed   By: Nelson Chimes M.D.   On: 11/20/2017 09:50   US Abdomen Limited Ruq  Result Date: 11/20/2017 CLINICAL DATA:  Mild right upper quadrant pain and epigastric tenderness EXAM: ULTRASOUND ABDOMEN LIMITED RIGHT UPPER QUADRANT COMPARISON:  CT AP 07/13/2017 FINDINGS: Gallbladder: No gallstones. Gallbladder wall is upper limits of normal in thickness measuring 3 mm. No sonographic Murphy sign noted by sonographer. Common bile duct: Diameter: 3 mm Liver: No focal lesion identified. Within normal limits in parenchymal echogenicity. Portal vein is patent on color Doppler imaging with normal direction of blood flow towards the liver. IMPRESSION: 1. The gallbladder wall thickness is upper limits  of normal at 3 mm. The gallbladder is otherwise normal without stones or sonographic Murphy's sign. Electronically Signed   By: Kerby Moors M.D.   On: 11/20/2017 09:49     ASSESSMENT/PLAN:  Adenocarcinoma of right lung, stage 3 ([facility]) This is a very pleasant 72 year old white male with a stage IIIa non-small cell lung cancer, squamous cell carcinoma  The patient underwent a course of concurrent chemoradiation with weekly carboplatin and paclitaxel status post 7 cycles. He had partial response after this treatment.  The patient is currently undergoing consolidation immunotherapy was Imfinzi (Durvalumab) status post14cycles. The patient continues to tolerate his  treatment well with no concerning complaints. He had a restaging CT scan of the chest performed recently and is here to discuss the results.  The patient was seen with Dr. Julien Nordmann.  CT scan results were discussed with the patient and his daughter which showed no evidence of disease progression.  Recommend that he continue on Imfinzi.  He will proceed with cycle #15 of his treatment today as scheduled. The patient will return in 2 weeks for evaluation prior to cycle #16.  For hypothyroidism, he is currently on levothyroxine 100 mcg daily.  This dose was recently increased about 1 month ago.  TSH from today is pending.  Recommend he continue on levothyroxine 100 mcg daily.  We will continue to watch his TSH level and adjust his medication as needed.  I reminded the patientas usual of the importance of quitting smoking.  He was advised to call immediately if he has any concerning symptoms in the interval. The patient voices understanding of current disease status and treatment options and is in agreement with the current care plan. All questions were answered. The patient knows to call the clinic with any problems, questions or concerns. We can certainly see the patient much sooner if necessary.   No orders of the defined types were placed in this encounter.  Mikey Bussing, DNP, AGPCNP-BC, AOCNP 12/15/17  ADDENDUM: Hematology/Oncology Attending: I had a face-to-face encounter with the patient.  I recommended his care plan.  This is a very pleasant 72 years old white male with a stage IIIa non-small cell lung cancer, squamous cell carcinoma status post a course of concurrent chemoradiation with weekly carboplatin and paclitaxel.  The patient is currently undergoing consolidation treatment with immunotherapy with Imfinzi (Durvalumab) status post 14 cycles.  He has been tolerating this treatment well with no concerning adverse effects. He had repeat CT scan of the chest performed recently. I  personally and independently reviewed the scans and discussed the results with the patient and his daughter. His a scan showed no concerning findings for disease progression. I recommended for the patient to continue his current treatment with Imfinzi (Durvalumab) and he will proceed with cycle #15 as scheduled. We strongly recommend for the patient to quit smoking. For hypothyroidism he will continue on levothyroxine. He will come back for follow-up visit in 2 weeks for evaluation before the next cycle of his treatment. The patient was advised to call immediately if he has any concerning symptoms in the interval.  Disclaimer: This note was dictated with voice recognition software. Similar sounding words can inadvertently be transcribed and may be missed upon review. Eilleen Kempf, MD 12/15/17

## 2017-12-15 NOTE — Assessment & Plan Note (Addendum)
This is a very pleasant 72 year old white male with a stage IIIa non-small cell lung cancer, squamous cell carcinoma  The patient underwent a course of concurrent chemoradiation with weekly carboplatin and paclitaxel status post 7 cycles. He had partial response after this treatment.  The patient is currently undergoing consolidation immunotherapy was Imfinzi (Durvalumab) status post14cycles. The patient continues to tolerate his treatment well with no concerning complaints. He had a restaging CT scan of the chest performed recently and is here to discuss the results.  The patient was seen with Dr. Julien Nordmann.  CT scan results were discussed with the patient and his daughter which showed no evidence of disease progression.  Recommend that he continue on Imfinzi.  He will proceed with cycle #15 of his treatment today as scheduled. The patient will return in 2 weeks for evaluation prior to cycle #16.  For hypothyroidism, he is currently on levothyroxine 100 mcg daily.  This dose was recently increased about 1 month ago.  TSH from today is pending.  Recommend he continue on levothyroxine 100 mcg daily.  We will continue to watch his TSH level and adjust his medication as needed.  I reminded the patientas usual of the importance of quitting smoking.  He was advised to call immediately if he has any concerning symptoms in the interval. The patient voices understanding of current disease status and treatment options and is in agreement with the current care plan. All questions were answered. The patient knows to call the clinic with any problems, questions or concerns. We can certainly see the patient much sooner if necessary.

## 2017-12-28 ENCOUNTER — Inpatient Hospital Stay (HOSPITAL_BASED_OUTPATIENT_CLINIC_OR_DEPARTMENT_OTHER): Payer: Medicare HMO | Admitting: Internal Medicine

## 2017-12-28 ENCOUNTER — Telehealth: Payer: Self-pay

## 2017-12-28 ENCOUNTER — Inpatient Hospital Stay: Payer: Medicare HMO

## 2017-12-28 ENCOUNTER — Telehealth: Payer: Self-pay | Admitting: Internal Medicine

## 2017-12-28 ENCOUNTER — Encounter: Payer: Self-pay | Admitting: Internal Medicine

## 2017-12-28 VITALS — BP 121/62 | HR 77 | Resp 18 | Ht 69.0 in | Wt 155.9 lb

## 2017-12-28 DIAGNOSIS — C342 Malignant neoplasm of middle lobe, bronchus or lung: Secondary | ICD-10-CM | POA: Diagnosis not present

## 2017-12-28 DIAGNOSIS — C3491 Malignant neoplasm of unspecified part of right bronchus or lung: Secondary | ICD-10-CM

## 2017-12-28 DIAGNOSIS — Z5112 Encounter for antineoplastic immunotherapy: Secondary | ICD-10-CM

## 2017-12-28 DIAGNOSIS — I10 Essential (primary) hypertension: Secondary | ICD-10-CM

## 2017-12-28 DIAGNOSIS — R5382 Chronic fatigue, unspecified: Secondary | ICD-10-CM

## 2017-12-28 DIAGNOSIS — E039 Hypothyroidism, unspecified: Secondary | ICD-10-CM

## 2017-12-28 LAB — CBC WITH DIFFERENTIAL/PLATELET
Basophils Absolute: 0.1 10*3/uL (ref 0.0–0.1)
Basophils Relative: 1 %
Eosinophils Absolute: 0.1 10*3/uL (ref 0.0–0.5)
Eosinophils Relative: 2 %
HEMATOCRIT: 37.9 % — AB (ref 38.4–49.9)
Hemoglobin: 12.8 g/dL — ABNORMAL LOW (ref 13.0–17.1)
LYMPHS PCT: 14 %
Lymphs Abs: 0.8 10*3/uL — ABNORMAL LOW (ref 0.9–3.3)
MCH: 29.4 pg (ref 27.2–33.4)
MCHC: 33.8 g/dL (ref 32.0–36.0)
MCV: 86.9 fL (ref 79.3–98.0)
MONO ABS: 0.4 10*3/uL (ref 0.1–0.9)
MONOS PCT: 7 %
Neutro Abs: 4.1 10*3/uL (ref 1.5–6.5)
Neutrophils Relative %: 76 %
Platelets: 168 10*3/uL (ref 140–400)
RBC: 4.36 MIL/uL (ref 4.20–5.82)
RDW: 15.6 % — AB (ref 11.0–14.6)
WBC: 5.4 10*3/uL (ref 4.0–10.3)

## 2017-12-28 LAB — COMPREHENSIVE METABOLIC PANEL
ALBUMIN: 3.9 g/dL (ref 3.5–5.0)
ALT: 8 U/L (ref 0–44)
AST: 15 U/L (ref 15–41)
Alkaline Phosphatase: 65 U/L (ref 38–126)
Anion gap: 11 (ref 5–15)
BUN: 23 mg/dL (ref 8–23)
CHLORIDE: 106 mmol/L (ref 98–111)
CO2: 20 mmol/L — AB (ref 22–32)
Calcium: 9 mg/dL (ref 8.9–10.3)
Creatinine, Ser: 1.68 mg/dL — ABNORMAL HIGH (ref 0.61–1.24)
GFR calc Af Amer: 45 mL/min — ABNORMAL LOW (ref >60)
GFR calc non Af Amer: 39 mL/min — ABNORMAL LOW (ref >60)
GLUCOSE: 217 mg/dL — AB (ref 70–99)
POTASSIUM: 4.3 mmol/L (ref 3.5–5.1)
SODIUM: 137 mmol/L (ref 135–145)
Total Bilirubin: 0.4 mg/dL (ref 0.3–1.2)
Total Protein: 7 g/dL (ref 6.5–8.1)

## 2017-12-28 MED ORDER — SODIUM CHLORIDE 0.9 % IV SOLN
Freq: Once | INTRAVENOUS | Status: AC
  Administered 2017-12-28: 13:00:00 via INTRAVENOUS
  Filled 2017-12-28: qty 250

## 2017-12-28 MED ORDER — DURVALUMAB 500 MG/10ML IV SOLN
740.0000 mg | Freq: Once | INTRAVENOUS | Status: AC
  Administered 2017-12-28: 740 mg via INTRAVENOUS
  Filled 2017-12-28: qty 10

## 2017-12-28 NOTE — Progress Notes (Signed)
Per Dr Julien Nordmann it is okay to treat pt today with Imfinzi and todays creatinine.

## 2017-12-28 NOTE — Patient Instructions (Signed)
[facility] Discharge Instructions for Patients Receiving Chemotherapy  Today you received the following chemotherapy agents Imfinzi.  To help prevent nausea and vomiting after your treatment, we encourage you to take your nausea medication as prescribed.   If you develop nausea and vomiting that is not controlled by your nausea medication, call the clinic.   BELOW ARE SYMPTOMS THAT SHOULD BE REPORTED IMMEDIATELY:  *FEVER GREATER THAN 100.5 F  *CHILLS WITH OR WITHOUT FEVER  NAUSEA AND VOMITING THAT IS NOT CONTROLLED WITH YOUR NAUSEA MEDICATION  *UNUSUAL SHORTNESS OF BREATH  *UNUSUAL BRUISING OR BLEEDING  TENDERNESS IN MOUTH AND THROAT WITH OR WITHOUT PRESENCE OF ULCERS  *URINARY PROBLEMS  *BOWEL PROBLEMS  UNUSUAL RASH Items with * indicate a potential emergency and should be followed up as soon as possible.  Feel free to call the clinic should you have any questions or concerns. The clinic phone number is [phone].  Please show the [place] at check-in to the Emergency Department and triage nurse.

## 2017-12-28 NOTE — Telephone Encounter (Signed)
Per talking with [facility] he said to add patient on Kristin schedule @10 :30 am for 9/12. Due to no over ride asked Mx to assist with patient schedule. Per 8/15 follow up, and corrections will contact patient after schedule is complete

## 2017-12-28 NOTE — Progress Notes (Signed)
[place] Telephone:([phone]   [phone]) [phone]  OFFICE PROGRESS NOTE  [name], MD [address] [place] [address]  DIAGNOSIS: Stage IIIA (T1a, N2, M0) non-small cell lung cancer, squamous cell carcinoma presented with right middle lobe pulmonary nodule in addition to ipsilateral hilar and ipsilateral mediastinal lymphadenopathy diagnosed in June 2018.  PRIOR THERAPY:  A course of concurrent chemoradiation with weekly carboplatin for AUC of 2 and paclitaxel 45 MG/M2. Status post 7 cycles.  CURRENT THERAPY: Consolidation immunotherapy with Imfinzi (Durvalumab) 10 MG/KG every 2 weeks.first dose 02/22/2017.  Status post 15 cycles.  INTERVAL HISTORY: Matthew Wilkins 72 y.o. male returns to the clinic today for follow-up visit accompanied by his daughter.  The patient is feeling fine today with no concerning complaints.  He denied having any chest pain, but continues to have shortness of breath with exertion with mild cough and no hemoptysis.  He denied having any significant nausea, vomiting, diarrhea or constipation.  He denied having any skin rash.  He lost few pounds since his last visit.  Unfortunately he continues to smoke at regular basis.  He is here today for evaluation before starting cycle #16 of his immunotherapy.  MEDICAL HISTORY: Past Medical History:  Diagnosis Date  . Adenocarcinoma of right lung, stage 3 ([place]) 11/17/2016  . Anxiety   . Arthritis   . Coronary artery disease   . Diabetes mellitus   . DVT (deep venous thrombosis) ([place])   . Encounter for antineoplastic chemotherapy 11/17/2016  . History of radiation therapy 11/28/16-01/06/17   right lung was treated to 60 Gy in 30 fractions  . Hypercholesterolemia   . Hypertension   . PAD (peripheral artery disease) ([place] [place])   . SOB (shortness of breath)     ALLERGIES:  is allergic to aspirin.  MEDICATIONS:  Current Outpatient Medications  Medication Sig Dispense Refill  . albuterol  (PROVENTIL HFA;VENTOLIN HFA) 108 (90 Base) MCG/ACT inhaler Inhale 1-2 puffs into the lungs every 6 (six) hours as needed for wheezing or shortness of breath.     [name] apixaban (ELIQUIS) 5 MG TABS tablet Take 1 tablet (5 mg total) by mouth 2 (two) times daily. 60 tablet 0  . dicyclomine (BENTYL) 20 MG tablet Take 1 tablet by mouth 3 (three) times daily.    . Fluticasone-Salmeterol (ADVAIR DISKUS) 250-50 MCG/DOSE AEPB Inhale 1 puff into the lungs every 12 (twelve) hours.     . gabapentin (NEURONTIN) 300 MG capsule Take 1 capsule by mouth 3 (three) times daily.    [name] glipiZIDE (GLUCOTROL XL) 5 MG 24 hr tablet Take 10 mg by mouth 2 (two) times daily.     [name] guaiFENesin (MUCINEX) 600 MG 12 hr tablet Take 1 tablet (600 mg total) by mouth 2 (two) times daily. 10 tablet 0  . guaiFENesin-dextromethorphan (ROBITUSSIN DM) 100-10 MG/5ML syrup Take 5 mLs by mouth every 4 (four) hours as needed for cough. 118 mL 0  . levothyroxine (SYNTHROID) 100 MCG tablet Take 1 tablet (100 mcg total) by mouth daily before breakfast. 30 tablet 1  . metFORMIN (GLUCOPHAGE-XR) 500 MG 24 hr tablet Take 2 tablets by mouth 2 (two) times daily.    [name] omeprazole (PRILOSEC) 40 MG capsule Take 1 capsule by mouth daily.    . rosuvastatin (CRESTOR) 20 MG tablet Take 1 tablet (20 mg total) by mouth daily. 30 tablet 11  . sertraline (ZOLOFT) 25 MG tablet Take 50 mg by mouth daily.     [name]  ZETIA 10 MG tablet Take 10 mg by mouth daily.    . feeding supplement, GLUCERNA SHAKE, (GLUCERNA SHAKE) LIQD Take 237 mLs by mouth 3 (three) times daily between meals. (Patient not taking: Reported on 12/14/2017) 90 Can 0   No current facility-administered medications for this visit.     SURGICAL HISTORY:  Past Surgical History:  Procedure Laterality Date  . CARDIAC CATHETERIZATION N/A 06/10/2015   Procedure: Left Heart Cath and Coronary Angiography;  Surgeon: Burnell Blanks, MD;  Location: [facility] CV LAB;  Service: Cardiovascular;  Laterality: N/A;    . LOWER EXTREMITY ANGIOGRAM N/A 07/13/2011   Procedure: LOWER EXTREMITY ANGIOGRAM;  Surgeon: Burnell Blanks, MD;  Location: [facility] CATH LAB;  Service: Cardiovascular;  Laterality: N/A;  . OTHER SURGICAL HISTORY    . OTHER SURGICAL HISTORY    . OTHER SURGICAL HISTORY    . PERCUTANEOUS STENT INTERVENTION Right 07/13/2011   Procedure: PERCUTANEOUS STENT INTERVENTION;  Surgeon: Burnell Blanks, MD;  Location: [facility] CATH LAB;  Service: Cardiovascular;  Laterality: Right;    REVIEW OF SYSTEMS:  A comprehensive review of systems was negative except for: Constitutional: positive for fatigue and weight loss Respiratory: positive for dyspnea on exertion   PHYSICAL EXAMINATION: General appearance: alert, cooperative, fatigued and no distress Head: Normocephalic, without obvious abnormality, atraumatic Neck: no adenopathy, no JVD, supple, symmetrical, trachea midline and thyroid not enlarged, symmetric, no tenderness/mass/nodules Lymph nodes: Cervical, supraclavicular, and axillary nodes normal. Resp: clear to auscultation bilaterally Back: symmetric, no curvature. ROM normal. No CVA tenderness. Cardio: regular rate and rhythm, S1, S2 normal, no murmur, click, rub or gallop GI: soft, non-tender; bowel sounds normal; no masses,  no organomegaly Extremities: extremities normal, atraumatic, no cyanosis or edema  ECOG PERFORMANCE STATUS: 1 - Symptomatic but completely ambulatory  Blood pressure 121/62, pulse 77, resp. rate 18, height 5\' 9"  (1.753 m), weight 155 lb 14.4 oz (70.7 kg), SpO2 100 %.  LABORATORY DATA: Lab Results  Component Value Date   WBC 5.4 12/28/2017   HGB 12.8 (L) 12/28/2017   HCT 37.9 (L) 12/28/2017   MCV 86.9 12/28/2017   PLT 168 12/28/2017      Chemistry      Component Value Date/Time   NA 137 12/28/2017 1045   NA 136 05/04/2017 1036   K 4.3 12/28/2017 1045   K 4.0 05/04/2017 1036   CL 106 12/28/2017 1045   CO2 20 (L) 12/28/2017 1045   CO2 16 (L) 05/04/2017  1036   BUN 23 12/28/2017 1045   BUN 15.5 05/04/2017 1036   CREATININE 1.68 (H) 12/28/2017 1045   CREATININE 1.1 05/04/2017 1036      Component Value Date/Time   CALCIUM 9.0 12/28/2017 1045   CALCIUM 9.6 05/04/2017 1036   ALKPHOS 65 12/28/2017 1045   ALKPHOS 93 05/04/2017 1036   AST 15 12/28/2017 1045   AST 13 05/04/2017 1036   ALT 8 12/28/2017 1045   ALT 7 05/04/2017 1036   BILITOT 0.4 12/28/2017 1045   BILITOT 0.56 05/04/2017 1036       RADIOGRAPHIC STUDIES: No results found.  ASSESSMENT AND PLAN: This is a very pleasant 73 years old white male with a stage IIIa non-small cell lung cancer, squamous cell carcinoma  The patient underwent a course of concurrent chemoradiation with weekly carboplatin and paclitaxel status post 7 cycles. He had partial response after this treatment.  The patient is currently undergoing consolidation immunotherapy was Imfinzi (Durvalumab) status post 15 cycles. He continues to tolerate  his treatment well with no concerning adverse effects. I recommended for the patient to proceed with cycle #16 today as scheduled. The patient will come back for follow-up visit in 2 weeks for evaluation before starting cycle #17. He was advised to call immediately if he has any concerning symptoms in the interval. The patient voices understanding of current disease status and treatment options and is in agreement with the current care plan. All questions were answered. The patient knows to call the clinic with any problems, questions or concerns. We can certainly see the patient much sooner if necessary.  Disclaimer: This note was dictated with voice recognition software. Similar sounding words can inadvertently be transcribed and may not be corrected upon review.

## 2017-12-28 NOTE — Telephone Encounter (Signed)
Scheduled appt per 8/15 los - pt to get an updated schedule in treatment area.

## 2017-12-28 NOTE — Telephone Encounter (Signed)
Per Beverlee Nims RN okayed patient to be scheduled with Erasmo Downer on Friday 8/30, and 9/13 patient has no scheduled CTat this time. Per 8/15 follow up on scheduled appointments.

## 2018-01-11 ENCOUNTER — Other Ambulatory Visit: Payer: Self-pay | Admitting: Oncology

## 2018-01-12 ENCOUNTER — Inpatient Hospital Stay (HOSPITAL_BASED_OUTPATIENT_CLINIC_OR_DEPARTMENT_OTHER): Payer: Medicare HMO | Admitting: Oncology

## 2018-01-12 ENCOUNTER — Inpatient Hospital Stay: Payer: Medicare HMO

## 2018-01-12 ENCOUNTER — Other Ambulatory Visit: Payer: Self-pay | Admitting: Hematology and Oncology

## 2018-01-12 ENCOUNTER — Telehealth: Payer: Self-pay | Admitting: Oncology

## 2018-01-12 ENCOUNTER — Encounter: Payer: Self-pay | Admitting: Oncology

## 2018-01-12 VITALS — BP 153/68 | HR 87 | Temp 98.0°F | Resp 18

## 2018-01-12 DIAGNOSIS — Z5112 Encounter for antineoplastic immunotherapy: Secondary | ICD-10-CM | POA: Diagnosis not present

## 2018-01-12 DIAGNOSIS — E039 Hypothyroidism, unspecified: Secondary | ICD-10-CM

## 2018-01-12 DIAGNOSIS — C3491 Malignant neoplasm of unspecified part of right bronchus or lung: Secondary | ICD-10-CM

## 2018-01-12 DIAGNOSIS — F17200 Nicotine dependence, unspecified, uncomplicated: Secondary | ICD-10-CM

## 2018-01-12 DIAGNOSIS — R5382 Chronic fatigue, unspecified: Secondary | ICD-10-CM

## 2018-01-12 DIAGNOSIS — C342 Malignant neoplasm of middle lobe, bronchus or lung: Secondary | ICD-10-CM | POA: Diagnosis not present

## 2018-01-12 LAB — CBC WITH DIFFERENTIAL/PLATELET
BASOS ABS: 0.1 10*3/uL (ref 0.0–0.1)
BASOS PCT: 2 %
Eosinophils Absolute: 0.1 10*3/uL (ref 0.0–0.5)
Eosinophils Relative: 2 %
HCT: 37.9 % — ABNORMAL LOW (ref 38.4–49.9)
HEMOGLOBIN: 12.8 g/dL — AB (ref 13.0–17.1)
LYMPHS ABS: 0.8 10*3/uL — AB (ref 0.9–3.3)
Lymphocytes Relative: 15 %
MCH: 29.1 pg (ref 27.2–33.4)
MCHC: 33.8 g/dL (ref 32.0–36.0)
MCV: 86.2 fL (ref 79.3–98.0)
Monocytes Absolute: 0.4 10*3/uL (ref 0.1–0.9)
Monocytes Relative: 7 %
Neutro Abs: 4.1 10*3/uL (ref 1.5–6.5)
Neutrophils Relative %: 74 %
Platelets: 179 10*3/uL (ref 140–400)
RBC: 4.39 MIL/uL (ref 4.20–5.82)
RDW: 16.1 % — ABNORMAL HIGH (ref 11.0–14.6)
WBC: 5.5 10*3/uL (ref 4.0–10.3)

## 2018-01-12 LAB — COMPREHENSIVE METABOLIC PANEL
ALBUMIN: 4.1 g/dL (ref 3.5–5.0)
ALK PHOS: 72 U/L (ref 38–126)
ALT: 9 U/L (ref 0–44)
ANION GAP: 10 (ref 5–15)
AST: 14 U/L — AB (ref 15–41)
BILIRUBIN TOTAL: 0.4 mg/dL (ref 0.3–1.2)
BUN: 19 mg/dL (ref 8–23)
CALCIUM: 9.5 mg/dL (ref 8.9–10.3)
CO2: 22 mmol/L (ref 22–32)
CREATININE: 1.23 mg/dL (ref 0.61–1.24)
Chloride: 105 mmol/L (ref 98–111)
GFR calc Af Amer: >60 mL/min (ref >60)
GFR calc non Af Amer: 56 mL/min — ABNORMAL LOW (ref >60)
GLUCOSE: 226 mg/dL — AB (ref 70–99)
Potassium: 3.9 mmol/L (ref 3.5–5.1)
Sodium: 137 mmol/L (ref 135–145)
TOTAL PROTEIN: 7.2 g/dL (ref 6.5–8.1)

## 2018-01-12 LAB — TSH: TSH: 23.588 u[IU]/mL — ABNORMAL HIGH (ref 0.320–4.118)

## 2018-01-12 MED ORDER — SODIUM CHLORIDE 0.9 % IV SOLN
Freq: Once | INTRAVENOUS | Status: AC
  Administered 2018-01-12: 15:00:00 via INTRAVENOUS
  Filled 2018-01-12: qty 250

## 2018-01-12 MED ORDER — SODIUM CHLORIDE 0.9 % IV SOLN: 10.0000 mg/kg | Freq: Once | INTRAVENOUS | Status: DC

## 2018-01-12 MED ORDER — SODIUM CHLORIDE 0.9 % IV SOLN
740.0000 mg | Freq: Once | INTRAVENOUS | Status: AC
  Administered 2018-01-12: 740 mg via INTRAVENOUS
  Filled 2018-01-12: qty 4.8

## 2018-01-12 NOTE — Patient Instructions (Addendum)
[facility] Discharge Instructions for Patients Receiving Chemotherapy  Today you received the following chemotherapy agents durvalumab (Imfinzi).  To help prevent nausea and vomiting after your treatment, we encourage you to take your nausea medication as prescribed.   If you develop nausea and vomiting that is not controlled by your nausea medication, call the clinic.   BELOW ARE SYMPTOMS THAT SHOULD BE REPORTED IMMEDIATELY:  *FEVER GREATER THAN 100.5 F  *CHILLS WITH OR WITHOUT FEVER  NAUSEA AND VOMITING THAT IS NOT CONTROLLED WITH YOUR NAUSEA MEDICATION  *UNUSUAL SHORTNESS OF BREATH  *UNUSUAL BRUISING OR BLEEDING  TENDERNESS IN MOUTH AND THROAT WITH OR WITHOUT PRESENCE OF ULCERS  *URINARY PROBLEMS  *BOWEL PROBLEMS  UNUSUAL RASH Items with * indicate a potential emergency and should be followed up as soon as possible.  Feel free to call the clinic should you have any questions or concerns. The clinic phone number is [phone].  Please show the [place] at check-in to the Emergency Department and triage nurse.

## 2018-01-12 NOTE — Telephone Encounter (Signed)
Appts already scheduled per 8/30 los.

## 2018-01-12 NOTE — Assessment & Plan Note (Signed)
This is a very pleasant 72 year old white male with a stage IIIa non-small cell lung cancer, squamous cell carcinoma  The patient underwent a course of concurrent chemoradiation with weekly carboplatin and paclitaxel status post 7 cycles. He had partial response after this treatment.  The patient is currently undergoing consolidation immunotherapy was Imfinzi (Durvalumab) status post 16 cycles. He continues to tolerate his treatment well with no concerning adverse effects. I recommended for the patient to proceed with cycle #17 today as scheduled. The patient will come back for follow-up visit in 2 weeks for evaluation before starting cycle #18.  The patient continues to smoke.  He was strongly advised to stop smoking.  He was advised to call immediately if he has any concerning symptoms in the interval. The patient voices understanding of current disease status and treatment options and is in agreement with the current care plan. All questions were answered. The patient knows to call the clinic with any problems, questions or concerns. We can certainly see the patient much sooner if necessary.

## 2018-01-12 NOTE — Progress Notes (Signed)
[place] [facility] OFFICE PROGRESS NOTE  Matthew Sacramento, MD [address] [place] [address]  DIAGNOSIS: Stage IIIA (T1a, N2, M0) non-small cell lung cancer, squamous cell carcinoma presented with right middle lobe pulmonary nodule in addition to ipsilateral hilar and ipsilateral mediastinal lymphadenopathy diagnosed in June 2018.  PRIOR THERAPY:  A course of concurrent chemoradiation with weekly carboplatin for AUC of 2 and paclitaxel 45 MG/M2. Status post 7 cycles.  CURRENT THERAPY: Consolidation immunotherapy with Imfinzi (Durvalumab) 10 MG/KG every 2 weeks.first dose 02/22/2017.  Status post 16 cycles. INTERVAL HISTORY: Matthew Wilkins 72 y.o. male returns for routine follow-up visit accompanied by his daughter.  The patient is feeling fine today and has no specific complaints.  He was seen in the infusion room today.  He denies fevers and chills.  Denies chest pain, but continues to have shortness of breath with exertion.  He reports a mild intermittent cough but no hemoptysis.  He denies nausea, vomiting, constipation, diarrhea.  Denies recent weight loss or night sweats.  The patient is here for evaluation prior to cycle #17 of his treatment.  MEDICAL HISTORY: Past Medical History:  Diagnosis Date  . Adenocarcinoma of right lung, stage 3 ([place]) 11/17/2016  . Anxiety   . Arthritis   . Coronary artery disease   . Diabetes mellitus   . DVT (deep venous thrombosis) ([place])   . Encounter for antineoplastic chemotherapy 11/17/2016  . History of radiation therapy 11/28/16-01/06/17   right lung was treated to 60 Gy in 30 fractions  . Hypercholesterolemia   . Hypertension   . PAD (peripheral artery disease) ([place])   . SOB (shortness of breath)     ALLERGIES:  is allergic to aspirin.  MEDICATIONS:  Current Outpatient Medications  Medication Sig Dispense Refill  . albuterol (PROVENTIL HFA;VENTOLIN HFA) 108 (90 Base) MCG/ACT inhaler Inhale 1-2 puffs into the lungs every 6 (six)  hours as needed for wheezing or shortness of breath.     [name] apixaban (ELIQUIS) 5 MG TABS tablet Take 1 tablet (5 mg total) by mouth 2 (two) times daily. 60 tablet 0  . dicyclomine (BENTYL) 20 MG tablet Take 1 tablet by mouth 3 (three) times daily.    . feeding supplement, GLUCERNA SHAKE, (GLUCERNA SHAKE) LIQD Take 237 mLs by mouth 3 (three) times daily between meals. (Patient not taking: Reported on 12/14/2017) 90 Can 0  . Fluticasone-Salmeterol (ADVAIR DISKUS) 250-50 MCG/DOSE AEPB Inhale 1 puff into the lungs every 12 (twelve) hours.     . gabapentin (NEURONTIN) 300 MG capsule Take 1 capsule by mouth 3 (three) times daily.    [name] glipiZIDE (GLUCOTROL XL) 5 MG 24 hr tablet Take 10 mg by mouth 2 (two) times daily.     [name] guaiFENesin (MUCINEX) 600 MG 12 hr tablet Take 1 tablet (600 mg total) by mouth 2 (two) times daily. 10 tablet 0  . guaiFENesin-dextromethorphan (ROBITUSSIN DM) 100-10 MG/5ML syrup Take 5 mLs by mouth every 4 (four) hours as needed for cough. 118 mL 0  . levothyroxine (SYNTHROID, LEVOTHROID) 100 MCG tablet TAKE 1 TABLET DAILY BEFORE BREAKFAST. 30 tablet 0  . metFORMIN (GLUCOPHAGE-XR) 500 MG 24 hr tablet Take 2 tablets by mouth 2 (two) times daily.    [name] omeprazole (PRILOSEC) 40 MG capsule Take 1 capsule by mouth daily.    . rosuvastatin (CRESTOR) 20 MG tablet Take 1 tablet (20 mg total) by mouth daily. 30 tablet 11  . sertraline (ZOLOFT) 25 MG tablet Take  50 mg by mouth daily.     [name] ZETIA 10 MG tablet Take 10 mg by mouth daily.     No current facility-administered medications for this visit.    Facility-Administered Medications Ordered in Other Visits  Medication Dose Route Frequency Provider Last Rate Last Dose  . durvalumab (IMFINZI) 740 mg in sodium chloride 0.9 % 100 mL chemo infusion  740 mg Intravenous Once Curt Bears, MD        SURGICAL HISTORY:  Past Surgical History:  Procedure Laterality Date  . CARDIAC CATHETERIZATION N/A 06/10/2015   Procedure: Left Heart Cath and  Coronary Angiography;  Surgeon: Burnell Blanks, MD;  Location: [place] CV LAB;  Service: Cardiovascular;  Laterality: N/A;  . LOWER EXTREMITY ANGIOGRAM N/A 07/13/2011   Procedure: LOWER EXTREMITY ANGIOGRAM;  Surgeon: Burnell Blanks, MD;  Location[facility] CATH LAB;  Service: Cardiovascular;  Laterality: N/A;  . OTHER SURGICAL HISTORY    . OTHER SURGICAL HISTORY    . OTHER SURGICAL HISTORY    . PERCUTANEOUS STENT INTERVENTION Right 07/13/2011   Procedure: PERCUTANEOUS STENT INTERVENTION;  Surgeon: Burnell Blanks, MD;  Location: [facility] CATH LAB;  Service: Cardiovascular;  Laterality: Right;    REVIEW OF SYSTEMS:   Review of Systems  Constitutional: Negative for appetite change, chills, fatigue, fever and unexpected weight change.  HENT:   Negative for mouth sores, nosebleeds, sore throat and trouble swallowing.   Eyes: Negative for eye problems and icterus.  Respiratory: Negative for hemoptysis, shortness of breath at rest and wheezing.  Reports cough and shortness of breath with exertion. Cardiovascular: Negative for chest pain and leg swelling.  Gastrointestinal: Negative for abdominal pain, constipation, diarrhea, nausea and vomiting.  Genitourinary: Negative for bladder incontinence, difficulty urinating, dysuria, frequency and hematuria.   Musculoskeletal: Negative for back pain, gait problem, neck pain and neck stiffness.  Skin: Negative for itching and rash.  Neurological: Negative for dizziness, extremity weakness, gait problem, headaches, light-headedness and seizures.  Hematological: Negative for adenopathy. Does not bruise/bleed easily.  Psychiatric/Behavioral: Negative for confusion, depression and sleep disturbance. The patient is not nervous/anxious.     PHYSICAL EXAMINATION:  Temperature 98.0, pulse 87, blood pressure 153/68, respirations 18, O2 sat 99% on room air  ECOG PERFORMANCE STATUS: 1 - Symptomatic but completely ambulatory  Physical Exam   Constitutional: Oriented to person, place, and time and well-developed, well-nourished, and in no distress. No distress.  HENT:  Head: Normocephalic and atraumatic.  Mouth/Throat: Oropharynx is clear and moist. No oropharyngeal exudate.  Eyes: Conjunctivae are normal. Right eye exhibits no discharge. Left eye exhibits no discharge. No scleral icterus.  Neck: Normal range of motion. Neck supple.  Cardiovascular: Normal rate, regular rhythm, normal heart sounds and intact distal pulses.   Pulmonary/Chest: Effort normal and breath sounds normal. No respiratory distress. No wheezes. No rales.  Abdominal: Soft. Bowel sounds are normal. Exhibits no distension and no mass. There is no tenderness.  Musculoskeletal: Normal range of motion. Exhibits no edema.  Lymphadenopathy:    No cervical adenopathy.  Neurological: Alert and oriented to person, place, and time. Exhibits normal muscle tone. Gait normal. Coordination normal.  Skin: Skin is warm and dry. No rash noted. Not diaphoretic. No erythema. No pallor.  Psychiatric: Mood, memory and judgment normal.  Vitals reviewed.  LABORATORY DATA: Lab Results  Component Value Date   WBC 5.5 01/12/2018   HGB 12.8 (L) 01/12/2018   HCT 37.9 (L) 01/12/2018   MCV 86.2 01/12/2018   PLT 179 01/12/2018  Chemistry      Component Value Date/Time   NA 137 01/12/2018 1419   NA 136 05/04/2017 1036   K 3.9 01/12/2018 1419   K 4.0 05/04/2017 1036   CL 105 01/12/2018 1419   CO2 22 01/12/2018 1419   CO2 16 (L) 05/04/2017 1036   BUN 19 01/12/2018 1419   BUN 15.5 05/04/2017 1036   CREATININE 1.23 01/12/2018 1419   CREATININE 1.1 05/04/2017 1036      Component Value Date/Time   CALCIUM 9.5 01/12/2018 1419   CALCIUM 9.6 05/04/2017 1036   ALKPHOS 72 01/12/2018 1419   ALKPHOS 93 05/04/2017 1036   AST 14 (L) 01/12/2018 1419   AST 13 05/04/2017 1036   ALT 9 01/12/2018 1419   ALT 7 05/04/2017 1036   BILITOT 0.4 01/12/2018 1419   BILITOT 0.56  05/04/2017 1036       RADIOGRAPHIC STUDIES:  No results found.   ASSESSMENT/PLAN:  Adenocarcinoma of right lung, stage 3 ([facility]) This is a very pleasant 72 year old white male with a stage IIIa non-small cell lung cancer, squamous cell carcinoma  The patient underwent a course of concurrent chemoradiation with weekly carboplatin and paclitaxel status post 7 cycles. He had partial response after this treatment.  The patient is currently undergoing consolidation immunotherapy was Imfinzi (Durvalumab) status post 16 cycles. He continues to tolerate his treatment well with no concerning adverse effects. I recommended for the patient to proceed with cycle #17 today as scheduled. The patient will come back for follow-up visit in 2 weeks for evaluation before starting cycle #18.  The patient continues to smoke.  He was strongly advised to stop smoking.  He was advised to call immediately if he has any concerning symptoms in the interval. The patient voices understanding of current disease status and treatment options and is in agreement with the current care plan. All questions were answered. The patient knows to call the clinic with any problems, questions or concerns. We can certainly see the patient much sooner if necessary.   No orders of the defined types were placed in this encounter.    Mikey Bussing, DNP, AGPCNP-BC, AOCNP 01/12/18

## 2018-01-18 ENCOUNTER — Other Ambulatory Visit: Payer: Medicare HMO

## 2018-01-18 ENCOUNTER — Ambulatory Visit: Payer: Medicare HMO | Admitting: Oncology

## 2018-01-18 ENCOUNTER — Ambulatory Visit: Payer: Medicare HMO

## 2018-01-25 ENCOUNTER — Ambulatory Visit: Payer: Medicare HMO

## 2018-01-25 ENCOUNTER — Other Ambulatory Visit: Payer: Medicare HMO

## 2018-01-26 ENCOUNTER — Inpatient Hospital Stay: Payer: Medicare HMO

## 2018-01-26 ENCOUNTER — Encounter: Payer: Self-pay | Admitting: Oncology

## 2018-01-26 ENCOUNTER — Ambulatory Visit: Payer: Medicare HMO

## 2018-01-26 ENCOUNTER — Other Ambulatory Visit: Payer: Self-pay | Admitting: Internal Medicine

## 2018-01-26 ENCOUNTER — Other Ambulatory Visit: Payer: Medicare HMO

## 2018-01-26 ENCOUNTER — Ambulatory Visit: Payer: Medicare HMO | Admitting: Oncology

## 2018-01-26 ENCOUNTER — Telehealth: Payer: Self-pay

## 2018-01-26 ENCOUNTER — Inpatient Hospital Stay (HOSPITAL_BASED_OUTPATIENT_CLINIC_OR_DEPARTMENT_OTHER): Payer: Medicare HMO | Admitting: Oncology

## 2018-01-26 ENCOUNTER — Inpatient Hospital Stay: Payer: Medicare HMO | Attending: Internal Medicine

## 2018-01-26 ENCOUNTER — Telehealth: Payer: Self-pay | Admitting: Oncology

## 2018-01-26 ENCOUNTER — Encounter: Payer: Self-pay | Admitting: General Practice

## 2018-01-26 VITALS — BP 113/59 | HR 87 | Resp 19 | Wt 159.1 lb

## 2018-01-26 DIAGNOSIS — C342 Malignant neoplasm of middle lobe, bronchus or lung: Secondary | ICD-10-CM | POA: Diagnosis not present

## 2018-01-26 DIAGNOSIS — Z5112 Encounter for antineoplastic immunotherapy: Secondary | ICD-10-CM | POA: Insufficient documentation

## 2018-01-26 DIAGNOSIS — Z79899 Other long term (current) drug therapy: Secondary | ICD-10-CM | POA: Diagnosis not present

## 2018-01-26 DIAGNOSIS — C3491 Malignant neoplasm of unspecified part of right bronchus or lung: Secondary | ICD-10-CM

## 2018-01-26 DIAGNOSIS — R5382 Chronic fatigue, unspecified: Secondary | ICD-10-CM

## 2018-01-26 LAB — COMPREHENSIVE METABOLIC PANEL
ALT: 11 U/L (ref 0–44)
ANION GAP: 12 (ref 5–15)
AST: 18 U/L (ref 15–41)
Albumin: 4.2 g/dL (ref 3.5–5.0)
Alkaline Phosphatase: 79 U/L (ref 38–126)
BUN: 17 mg/dL (ref 8–23)
CHLORIDE: 107 mmol/L (ref 98–111)
CO2: 21 mmol/L — ABNORMAL LOW (ref 22–32)
Calcium: 9.4 mg/dL (ref 8.9–10.3)
Creatinine, Ser: 1.26 mg/dL — ABNORMAL HIGH (ref 0.61–1.24)
GFR, EST NON AFRICAN AMERICAN: 55 mL/min — AB (ref >60)
Glucose, Bld: 163 mg/dL — ABNORMAL HIGH (ref 70–99)
POTASSIUM: 4.2 mmol/L (ref 3.5–5.1)
Sodium: 140 mmol/L (ref 135–145)
Total Bilirubin: 0.4 mg/dL (ref 0.3–1.2)
Total Protein: 7.7 g/dL (ref 6.5–8.1)

## 2018-01-26 LAB — CBC WITH DIFFERENTIAL/PLATELET
BASOS ABS: 0.1 10*3/uL (ref 0.0–0.1)
BASOS PCT: 1 %
EOS PCT: 2 %
Eosinophils Absolute: 0.1 10*3/uL (ref 0.0–0.5)
HEMATOCRIT: 38.5 % (ref 38.4–49.9)
Hemoglobin: 13 g/dL (ref 13.0–17.1)
Lymphocytes Relative: 14 %
Lymphs Abs: 0.8 10*3/uL — ABNORMAL LOW (ref 0.9–3.3)
MCH: 29.3 pg (ref 27.2–33.4)
MCHC: 33.8 g/dL (ref 32.0–36.0)
MCV: 86.7 fL (ref 79.3–98.0)
MONO ABS: 0.3 10*3/uL (ref 0.1–0.9)
MONOS PCT: 5 %
NEUTROS ABS: 4.5 10*3/uL (ref 1.5–6.5)
Neutrophils Relative %: 78 %
PLATELETS: 190 10*3/uL (ref 140–400)
RBC: 4.44 MIL/uL (ref 4.20–5.82)
RDW: 14.4 % (ref 11.0–14.6)
WBC: 5.8 10*3/uL (ref 4.0–10.3)

## 2018-01-26 LAB — TSH: TSH: 26.691 u[IU]/mL — AB (ref 0.320–4.118)

## 2018-01-26 MED ORDER — SODIUM CHLORIDE 0.9 % IV SOLN
Freq: Once | INTRAVENOUS | Status: AC
  Administered 2018-01-26: 13:00:00 via INTRAVENOUS
  Filled 2018-01-26: qty 250

## 2018-01-26 MED ORDER — SODIUM CHLORIDE 0.9 % IV SOLN
10.5000 mg/kg | Freq: Once | INTRAVENOUS | Status: AC
  Administered 2018-01-26: 740 mg via INTRAVENOUS
  Filled 2018-01-26: qty 4.8

## 2018-01-26 NOTE — Progress Notes (Signed)
[facility] CSW Progress Notes  Caregiver requests food bag - bag provided from supply in pantry.  Edwyna Shell, LCSW Clinical Social Worker Phone:  [phone]

## 2018-01-26 NOTE — Patient Instructions (Signed)
[facility] Discharge Instructions for Patients Receiving Chemotherapy  Today you received the following chemotherapy agents: Imfinzi.  To help prevent nausea and vomiting after your treatment, we encourage you to take your nausea medication as directed.   If you develop nausea and vomiting that is not controlled by your nausea medication, call the clinic.   BELOW ARE SYMPTOMS THAT SHOULD BE REPORTED IMMEDIATELY:  *FEVER GREATER THAN 100.5 F  *CHILLS WITH OR WITHOUT FEVER  NAUSEA AND VOMITING THAT IS NOT CONTROLLED WITH YOUR NAUSEA MEDICATION  *UNUSUAL SHORTNESS OF BREATH  *UNUSUAL BRUISING OR BLEEDING  TENDERNESS IN MOUTH AND THROAT WITH OR WITHOUT PRESENCE OF ULCERS  *URINARY PROBLEMS  *BOWEL PROBLEMS  UNUSUAL RASH Items with * indicate a potential emergency and should be followed up as soon as possible.  Feel free to call the clinic should you have any questions or concerns. The clinic phone number is [phone].  Please show the CHEMO ALERT CARD at check-in to the Emergency Department and triage nurse.   

## 2018-01-26 NOTE — Assessment & Plan Note (Addendum)
This is a very pleasant 72 year old white male with a stage IIIa non-small cell lung cancer, squamous cell carcinoma  The patient underwent a course of concurrent chemoradiation with weekly carboplatin and paclitaxel status post 7 cycles. He had partial response after this treatment.  The patient is currently undergoing consolidation immunotherapy was Imfinzi (Durvalumab) status post 17cycles. He continues to tolerate his treatment well with no concerning adverse effects. I recommended for the patient to proceed with cycle #18 today as scheduled. The patient will have a restaging CT scan of the chest prior to his next visit.  He will follow-up in 2 weeks for evaluation prior to cycle #19 and to review his restaging CT scan results.  The patient continues to smoke.  He was strongly advised to stop smoking.  He was advised to call immediately if he has any concerning symptoms in the interval. The patient voices understanding of current disease status and treatment options and is in agreement with the current care plan. All questions were answered. The patient knows to call the clinic with any problems, questions or concerns. We can certainly see the patient much sooner if necessary.

## 2018-01-26 NOTE — Telephone Encounter (Signed)
Printed avs and calender of upcoming appointment. per 9/13 los

## 2018-01-26 NOTE — Telephone Encounter (Signed)
3 cycles already scheduled per 9/13 los.

## 2018-01-26 NOTE — Progress Notes (Signed)
[facility] OFFICE PROGRESS NOTE  Matthew Sacramento, MD [address] [place] [address]  DIAGNOSIS:Stage IIIA (T1a, N2, M0) non-small cell lung cancer, squamous cell carcinoma presented with right middle lobe pulmonary nodule in addition to ipsilateral hilar and ipsilateral mediastinal lymphadenopathy diagnosed in June 2018.  PRIOR THERAPY: A course of concurrent chemoradiation with weekly carboplatin for AUC of 2 and paclitaxel 45 MG/M2. Status post 7 cycles.  CURRENT THERAPY: Consolidation immunotherapy with Imfinzi (Durvalumab) 10 MG/KG every 2 weeks.first dose 02/22/2017. Status post 17cycles.  INTERVAL HISTORY: Matthew Wilkins 72 y.o. male returns for routine follow-up visit accompanied by his daughter.  The patient is feeling fine today and has no specific complaints.  He denies fevers and chills.  Denies chest pain, cough, hemoptysis.  He reports his baseline shortness of breath with exertion.  Denies nausea, vomiting, constipation, diarrhea.  Denies recent weight loss or night sweats.  The patient is here for evaluation prior to cycle #18 of his treatment.  MEDICAL HISTORY: Past Medical History:  Diagnosis Date  . Adenocarcinoma of right lung, stage 3 ([place]) 11/17/2016  . Anxiety   . Arthritis   . Coronary artery disease   . Diabetes mellitus   . DVT (deep venous thrombosis) ([place])   . Encounter for antineoplastic chemotherapy 11/17/2016  . History of radiation therapy 11/28/16-01/06/17   right lung was treated to 60 Gy in 30 fractions  . Hypercholesterolemia   . Hypertension   . PAD (peripheral artery disease) ([place])   . SOB (shortness of breath)     ALLERGIES:  is allergic to aspirin.  MEDICATIONS:  Current Outpatient Medications  Medication Sig Dispense Refill  . albuterol (PROVENTIL HFA;VENTOLIN HFA) 108 (90 Base) MCG/ACT inhaler Inhale 1-2 puffs into the lungs every 6 (six) hours as needed for wheezing or shortness of breath.     [name] apixaban  (ELIQUIS) 5 MG TABS tablet Take 1 tablet (5 mg total) by mouth 2 (two) times daily. 60 tablet 0  . dicyclomine (BENTYL) 20 MG tablet Take 1 tablet by mouth 3 (three) times daily.    . feeding supplement, GLUCERNA SHAKE, (GLUCERNA SHAKE) LIQD Take 237 mLs by mouth 3 (three) times daily between meals. (Patient not taking: Reported on 12/14/2017) 90 Can 0  . Fluticasone-Salmeterol (ADVAIR DISKUS) 250-50 MCG/DOSE AEPB Inhale 1 puff into the lungs every 12 (twelve) hours.     . gabapentin (NEURONTIN) 300 MG capsule Take 1 capsule by mouth 3 (three) times daily.    [name] glipiZIDE (GLUCOTROL XL) 5 MG 24 hr tablet Take 10 mg by mouth 2 (two) times daily.     [name] guaiFENesin (MUCINEX) 600 MG 12 hr tablet Take 1 tablet (600 mg total) by mouth 2 (two) times daily. 10 tablet 0  . guaiFENesin-dextromethorphan (ROBITUSSIN DM) 100-10 MG/5ML syrup Take 5 mLs by mouth every 4 (four) hours as needed for cough. 118 mL 0  . levothyroxine (SYNTHROID, LEVOTHROID) 100 MCG tablet TAKE 1 TABLET DAILY BEFORE BREAKFAST. 30 tablet 0  . metFORMIN (GLUCOPHAGE-XR) 500 MG 24 hr tablet Take 2 tablets by mouth 2 (two) times daily.    [name] omeprazole (PRILOSEC) 40 MG capsule Take 1 capsule by mouth daily.    . rosuvastatin (CRESTOR) 20 MG tablet Take 1 tablet (20 mg total) by mouth daily. 30 tablet 11  . sertraline (ZOLOFT) 25 MG tablet Take 50 mg by mouth daily.     [name] ZETIA 10 MG tablet Take 10 mg by mouth daily.  No current facility-administered medications for this visit.    Facility-Administered Medications Ordered in Other Visits  Medication Dose Route Frequency Provider Last Rate Last Dose  . durvalumab (IMFINZI) 740 mg in sodium chloride 0.9 % 100 mL chemo infusion  10.5 mg/kg (Treatment Plan Recorded) Intravenous Once Curt Bears, MD        SURGICAL HISTORY:  Past Surgical History:  Procedure Laterality Date  . CARDIAC CATHETERIZATION N/A 06/10/2015   Procedure: Left Heart Cath and Coronary Angiography;  Surgeon:  Burnell Blanks, MD;  Location: [facility];  Service: Cardiovascular;  Laterality: N/A;  . LOWER EXTREMITY ANGIOGRAM N/A 07/13/2011   Procedure: LOWER EXTREMITY ANGIOGRAM;  Surgeon: Burnell Blanks, MD;  Location: [facility] CATH LAB;  Service: Cardiovascular;  Laterality: N/A;  . OTHER SURGICAL HISTORY    . OTHER SURGICAL HISTORY    . OTHER SURGICAL HISTORY    . PERCUTANEOUS STENT INTERVENTION Right 07/13/2011   Procedure: PERCUTANEOUS STENT INTERVENTION;  Surgeon: Burnell Blanks, MD;  Location: [facility] CATH LAB;  Service: Cardiovascular;  Laterality: Right;    REVIEW OF SYSTEMS:   Review of Systems  Constitutional: Negative for appetite change, chills, fatigue, fever and unexpected weight change.  HENT:   Negative for mouth sores, nosebleeds, sore throat and trouble swallowing.   Eyes: Negative for eye problems and icterus.  Respiratory: Negative for cough, hemoptysis, and wheezing.  Positive for shortness of breath with exertion which is at baseline. Cardiovascular: Negative for chest pain and leg swelling.  Gastrointestinal: Negative for abdominal pain, constipation, diarrhea, nausea and vomiting.  Genitourinary: Negative for bladder incontinence, difficulty urinating, dysuria, frequency and hematuria.   Musculoskeletal: Negative for back pain, gait problem, neck pain and neck stiffness.  Skin: Negative for itching and rash.  Neurological: Negative for dizziness, extremity weakness, gait problem, headaches, light-headedness and seizures.  Hematological: Negative for adenopathy. Does not bruise/bleed easily.  Psychiatric/Behavioral: Negative for confusion, depression and sleep disturbance. The patient is not nervous/anxious.     PHYSICAL EXAMINATION:  Blood pressure (!) 113/59, pulse 87, resp. rate 19, weight 159 lb 1 oz (72.2 kg), SpO2 100 %.  ECOG PERFORMANCE STATUS: 1 - Symptomatic but completely ambulatory  Physical Exam  Constitutional: Oriented to person,  place, and time and well-developed, well-nourished, and in no distress. No distress.  HENT:  Head: Normocephalic and atraumatic.  Mouth/Throat: Oropharynx is clear and moist. No oropharyngeal exudate.  Eyes: Conjunctivae are normal. Right eye exhibits no discharge. Left eye exhibits no discharge. No scleral icterus.  Neck: Normal range of motion. Neck supple.  Cardiovascular: Normal rate, regular rhythm, normal heart sounds and intact distal pulses.   Pulmonary/Chest: Effort normal and breath sounds normal. No respiratory distress. No wheezes. No rales.  Abdominal: Soft. Bowel sounds are normal. Exhibits no distension and no mass. There is no tenderness.  Musculoskeletal: Normal range of motion. Exhibits no edema.  Lymphadenopathy:    No cervical adenopathy.  Neurological: Alert and oriented to person, place, and time. Exhibits normal muscle tone. Gait normal. Coordination normal.  Skin: Skin is warm and dry. No rash noted. Not diaphoretic. No erythema. No pallor.  Psychiatric: Mood, memory and judgment normal.  Vitals reviewed.  LABORATORY DATA: Lab Results  Component Value Date   WBC 5.8 01/26/2018   HGB 13.0 01/26/2018   HCT 38.5 01/26/2018   MCV 86.7 01/26/2018   PLT 190 01/26/2018      Chemistry      Component Value Date/Time   NA 140 01/26/2018 1140  NA 136 05/04/2017 1036   K 4.2 01/26/2018 1140   K 4.0 05/04/2017 1036   CL 107 01/26/2018 1140   CO2 21 (L) 01/26/2018 1140   CO2 16 (L) 05/04/2017 1036   BUN 17 01/26/2018 1140   BUN 15.5 05/04/2017 1036   CREATININE 1.26 (H) 01/26/2018 1140   CREATININE 1.1 05/04/2017 1036      Component Value Date/Time   CALCIUM 9.4 01/26/2018 1140   CALCIUM 9.6 05/04/2017 1036   ALKPHOS 79 01/26/2018 1140   ALKPHOS 93 05/04/2017 1036   AST 18 01/26/2018 1140   AST 13 05/04/2017 1036   ALT 11 01/26/2018 1140   ALT 7 05/04/2017 1036   BILITOT 0.4 01/26/2018 1140   BILITOT 0.56 05/04/2017 1036       RADIOGRAPHIC  STUDIES:  No results found.   ASSESSMENT/PLAN:  Adenocarcinoma of right lung, stage 3 ([facility]) This is a very pleasant 72 year old white male with a stage IIIa non-small cell lung cancer, squamous cell carcinoma  The patient underwent a course of concurrent chemoradiation with weekly carboplatin and paclitaxel status post 7 cycles. He had partial response after this treatment.  The patient is currently undergoing consolidation immunotherapy was Imfinzi (Durvalumab) status post 17cycles. He continues to tolerate his treatment well with no concerning adverse effects. I recommended for the patient to proceed with cycle #18 today as scheduled. The patient will have a restaging CT scan of the chest prior to his next visit.  He will follow-up in 2 weeks for evaluation prior to cycle #19 and to review his restaging CT scan results.  The patient continues to smoke.  He was strongly advised to stop smoking.  He was advised to call immediately if he has any concerning symptoms in the interval. The patient voices understanding of current disease status and treatment options and is in agreement with the current care plan. All questions were answered. The patient knows to call the clinic with any problems, questions or concerns. We can certainly see the patient much sooner if necessary.   Orders Placed This Encounter  Procedures  . CT CHEST W CONTRAST    Standing Status:   Future    Standing Expiration Date:   01/27/2019    Scheduling Instructions:     Call daughter [phone] to schedule.    Order Specific Question:   If indicated for the ordered procedure, I authorize the administration of contrast media per Radiology protocol    Answer:   Yes    Order Specific Question:   Preferred imaging location?    Answer:   [facility]    Order Specific Question:   Radiology Contrast Protocol - do NOT remove file path    Answer:   [email or web address]    Order Specific  Question:   ** REASON FOR EXAM (FREE TEXT)    Answer:   Lung cancer. Restaging.     Mikey Bussing, DNP, AGPCNP-BC, AOCNP 01/26/18

## 2018-02-01 ENCOUNTER — Ambulatory Visit: Payer: Medicare HMO | Admitting: Oncology

## 2018-02-01 ENCOUNTER — Ambulatory Visit: Payer: Medicare HMO

## 2018-02-01 ENCOUNTER — Other Ambulatory Visit: Payer: Medicare HMO

## 2018-02-06 ENCOUNTER — Ambulatory Visit (HOSPITAL_COMMUNITY)
Admission: RE | Admit: 2018-02-06 | Discharge: 2018-02-06 | Disposition: A | Discharge status: Home / Self Care | Payer: Medicare HMO | Source: Ambulatory Visit | Attending: Oncology | Admitting: Oncology

## 2018-02-06 DIAGNOSIS — N200 Calculus of kidney: Secondary | ICD-10-CM | POA: Diagnosis not present

## 2018-02-06 DIAGNOSIS — I2510 Atherosclerotic heart disease of native coronary artery without angina pectoris: Secondary | ICD-10-CM | POA: Diagnosis not present

## 2018-02-06 DIAGNOSIS — C3490 Malignant neoplasm of unspecified part of unspecified bronchus or lung: Secondary | ICD-10-CM | POA: Diagnosis not present

## 2018-02-06 DIAGNOSIS — C3491 Malignant neoplasm of unspecified part of right bronchus or lung: Secondary | ICD-10-CM | POA: Diagnosis not present

## 2018-02-06 DIAGNOSIS — I700 Atherosclerosis of aorta: Secondary | ICD-10-CM | POA: Diagnosis not present

## 2018-02-06 DIAGNOSIS — J439 Emphysema, unspecified: Secondary | ICD-10-CM | POA: Insufficient documentation

## 2018-02-06 MED ORDER — IOHEXOL 300 MG/ML  SOLN
75.0000 mL | Freq: Once | INTRAMUSCULAR | Status: AC | PRN
  Administered 2018-02-06: 75 mL via INTRAVENOUS

## 2018-02-08 ENCOUNTER — Inpatient Hospital Stay: Payer: Medicare HMO

## 2018-02-08 ENCOUNTER — Ambulatory Visit: Payer: Medicare HMO

## 2018-02-08 ENCOUNTER — Encounter: Payer: Self-pay | Admitting: Oncology

## 2018-02-08 ENCOUNTER — Inpatient Hospital Stay (HOSPITAL_BASED_OUTPATIENT_CLINIC_OR_DEPARTMENT_OTHER): Payer: Medicare HMO | Admitting: Oncology

## 2018-02-08 VITALS — BP 128/71 | HR 73 | Temp 97.6°F | Resp 17 | Ht 69.0 in | Wt 159.4 lb

## 2018-02-08 DIAGNOSIS — C3491 Malignant neoplasm of unspecified part of right bronchus or lung: Secondary | ICD-10-CM

## 2018-02-08 DIAGNOSIS — Z79899 Other long term (current) drug therapy: Secondary | ICD-10-CM | POA: Diagnosis not present

## 2018-02-08 DIAGNOSIS — C342 Malignant neoplasm of middle lobe, bronchus or lung: Secondary | ICD-10-CM

## 2018-02-08 DIAGNOSIS — Z5112 Encounter for antineoplastic immunotherapy: Secondary | ICD-10-CM | POA: Diagnosis not present

## 2018-02-08 DIAGNOSIS — F17200 Nicotine dependence, unspecified, uncomplicated: Secondary | ICD-10-CM

## 2018-02-08 LAB — COMPREHENSIVE METABOLIC PANEL
ALK PHOS: 66 U/L (ref 38–126)
ALT: 12 U/L (ref 0–44)
AST: 15 U/L (ref 15–41)
Albumin: 4.1 g/dL (ref 3.5–5.0)
Anion gap: 8 (ref 5–15)
BUN: 12 mg/dL (ref 8–23)
CALCIUM: 9.4 mg/dL (ref 8.9–10.3)
CO2: 22 mmol/L (ref 22–32)
Chloride: 108 mmol/L (ref 98–111)
Creatinine, Ser: 1.22 mg/dL (ref 0.61–1.24)
GFR calc Af Amer: >60 mL/min (ref >60)
GFR, EST NON AFRICAN AMERICAN: 57 mL/min — AB (ref >60)
GLUCOSE: 186 mg/dL — AB (ref 70–99)
POTASSIUM: 4.2 mmol/L (ref 3.5–5.1)
Sodium: 138 mmol/L (ref 135–145)
Total Bilirubin: 0.5 mg/dL (ref 0.3–1.2)
Total Protein: 7.5 g/dL (ref 6.5–8.1)

## 2018-02-08 LAB — CBC WITH DIFFERENTIAL (CANCER CENTER ONLY)
BASOS PCT: 3 %
Basophils Absolute: 0.2 10*3/uL — ABNORMAL HIGH (ref 0.0–0.1)
Eosinophils Absolute: 0.1 10*3/uL (ref 0.0–0.5)
Eosinophils Relative: 2 %
HEMATOCRIT: 37.2 % — AB (ref 38.4–49.9)
HEMOGLOBIN: 12.6 g/dL — AB (ref 13.0–17.1)
LYMPHS ABS: 0.7 10*3/uL — AB (ref 0.9–3.3)
Lymphocytes Relative: 14 %
MCH: 29.1 pg (ref 27.2–33.4)
MCHC: 33.9 g/dL (ref 32.0–36.0)
MCV: 85.9 fL (ref 79.3–98.0)
MONOS PCT: 6 %
Monocytes Absolute: 0.3 10*3/uL (ref 0.1–0.9)
NEUTROS ABS: 3.7 10*3/uL (ref 1.5–6.5)
NEUTROS PCT: 75 %
Platelet Count: 182 10*3/uL (ref 140–400)
RBC: 4.34 MIL/uL (ref 4.20–5.82)
RDW: 15.8 % — ABNORMAL HIGH (ref 11.0–14.6)
WBC Count: 5 10*3/uL (ref 4.0–10.3)

## 2018-02-08 MED ORDER — SODIUM CHLORIDE 0.9 % IV SOLN
Freq: Once | INTRAVENOUS | Status: AC
  Administered 2018-02-08: 13:00:00 via INTRAVENOUS
  Filled 2018-02-08: qty 250

## 2018-02-08 MED ORDER — SODIUM CHLORIDE 0.9 % IV SOLN
740.0000 mg | Freq: Once | INTRAVENOUS | Status: AC
  Administered 2018-02-08: 740 mg via INTRAVENOUS
  Filled 2018-02-08: qty 4.8

## 2018-02-08 NOTE — Progress Notes (Signed)
[facility] OFFICE PROGRESS NOTE  Matthew Sacramento, MD [address] [place] [address]  DIAGNOSIS:Stage IIIA (T1a, N2, M0) non-small cell lung cancer, squamous cell carcinoma presented with right middle lobe pulmonary nodule in addition to ipsilateral hilar and ipsilateral mediastinal lymphadenopathy diagnosed in June 2018.  PRIOR THERAPY: A course of concurrent chemoradiation with weekly carboplatin for AUC of 2 and paclitaxel 45 MG/M2. Status post 7 cycles.  CURRENT THERAPY: Consolidation immunotherapy with Imfinzi (Durvalumab) 10 MG/KG every 2 weeks.first dose 02/22/2017. Status post 18cycles.  INTERVAL HISTORY: Matthew Wilkins 72 y.o. male returns for routine follow-up visit accompanied by his daughter.  The patient is feeling fine today and has no specific complaints.  He denies fevers and chills.  Denies chest pain, cough, hemoptysis.  He has baseline shortness of breath.  Denies nausea, vomiting, constipation, diarrhea.  Denies recent weight loss or night sweats.  He had a restaging CT scan of the chest and is here to discuss the results.  MEDICAL HISTORY: Past Medical History:  Diagnosis Date  . Adenocarcinoma of right lung, stage 3 (Matthew Wilkins) 11/17/2016  . Anxiety   . Arthritis   . Coronary artery disease   . Diabetes mellitus   . DVT (deep venous thrombosis) ([place])   . Encounter for antineoplastic chemotherapy 11/17/2016  . History of radiation therapy 11/28/16-01/06/17   right lung was treated to 60 Gy in 30 fractions  . Hypercholesterolemia   . Hypertension   . PAD (peripheral artery disease) ([place])   . SOB (shortness of breath)     ALLERGIES:  is allergic to aspirin.  MEDICATIONS:  Current Outpatient Medications  Medication Sig Dispense Refill  . albuterol (PROVENTIL HFA;VENTOLIN HFA) 108 (90 Base) MCG/ACT inhaler Inhale 1-2 puffs into the lungs every 6 (six) hours as needed for wheezing or shortness of breath.     [name] apixaban (ELIQUIS) 5 MG TABS  tablet Take 1 tablet (5 mg total) by mouth 2 (two) times daily. 60 tablet 0  . dicyclomine (BENTYL) 20 MG tablet Take 1 tablet by mouth 3 (three) times daily.    . Fluticasone-Salmeterol (ADVAIR DISKUS) 250-50 MCG/DOSE AEPB Inhale 1 puff into the lungs every 12 (twelve) hours.     . gabapentin (NEURONTIN) 300 MG capsule Take 1 capsule by mouth 3 (three) times daily.    [name] glipiZIDE (GLUCOTROL XL) 5 MG 24 hr tablet Take 10 mg by mouth 2 (two) times daily.     [name] guaiFENesin (MUCINEX) 600 MG 12 hr tablet Take 1 tablet (600 mg total) by mouth 2 (two) times daily. 10 tablet 0  . guaiFENesin-dextromethorphan (ROBITUSSIN DM) 100-10 MG/5ML syrup Take 5 mLs by mouth every 4 (four) hours as needed for cough. 118 mL 0  . levothyroxine (SYNTHROID, LEVOTHROID) 100 MCG tablet TAKE 1 TABLET DAILY BEFORE BREAKFAST. 30 tablet 0  . metFORMIN (GLUCOPHAGE-XR) 500 MG 24 hr tablet Take 2 tablets by mouth 2 (two) times daily.    [name] omeprazole (PRILOSEC) 40 MG capsule Take 1 capsule by mouth daily.    . rosuvastatin (CRESTOR) 20 MG tablet Take 1 tablet (20 mg total) by mouth daily. 30 tablet 11  . sertraline (ZOLOFT) 25 MG tablet Take 50 mg by mouth daily.     [name] ZETIA 10 MG tablet Take 10 mg by mouth daily.    . feeding supplement, GLUCERNA SHAKE, (GLUCERNA SHAKE) LIQD Take 237 mLs by mouth 3 (three) times daily between meals. (Patient not taking: Reported on 02/08/2018) 90  Can 0   No current facility-administered medications for this visit.    Facility-Administered Medications Ordered in Other Visits  Medication Dose Route Frequency Provider Last Rate Last Dose  . durvalumab (IMFINZI) 740 mg in sodium chloride 0.9 % 100 mL chemo infusion  740 mg Intravenous Once Curt Bears, MD        SURGICAL HISTORY:  Past Surgical History:  Procedure Laterality Date  . CARDIAC CATHETERIZATION N/A 06/10/2015   Procedure: Left Heart Cath and Coronary Angiography;  Surgeon: Burnell Blanks, MD;  Location: [facility];  Service: Cardiovascular;  Laterality: N/A;  . LOWER EXTREMITY ANGIOGRAM N/A 07/13/2011   Procedure: LOWER EXTREMITY ANGIOGRAM;  Surgeon: Burnell Blanks, MD;  Location: [facility] CATH LAB;  Service: Cardiovascular;  Laterality: N/A;  . OTHER SURGICAL HISTORY    . OTHER SURGICAL HISTORY    . OTHER SURGICAL HISTORY    . PERCUTANEOUS STENT INTERVENTION Right 07/13/2011   Procedure: PERCUTANEOUS STENT INTERVENTION;  Surgeon: Burnell Blanks, MD;  Location: [facility] CATH LAB;  Service: Cardiovascular;  Laterality: Right;    REVIEW OF SYSTEMS:   Review of Systems  Constitutional: Negative for appetite change, chills, fatigue, fever and unexpected weight change.  HENT:   Negative for mouth sores, nosebleeds, sore throat and trouble swallowing.   Eyes: Negative for eye problems and icterus.  Respiratory: Negative for cough, hemoptysis, and wheezing.  Positive for his baseline shortness of breath. Cardiovascular: Negative for chest pain and leg swelling.  Gastrointestinal: Negative for abdominal pain, constipation, diarrhea, nausea and vomiting.  Genitourinary: Negative for bladder incontinence, difficulty urinating, dysuria, frequency and hematuria.   Musculoskeletal: Negative for back pain, gait problem, neck pain and neck stiffness.  Skin: Negative for itching and rash.  Neurological: Negative for dizziness, extremity weakness, gait problem, headaches, light-headedness and seizures.  Hematological: Negative for adenopathy. Does not bruise/bleed easily.  Psychiatric/Behavioral: Negative for confusion, depression and sleep disturbance. The patient is not nervous/anxious.     PHYSICAL EXAMINATION:  Blood pressure 128/71, pulse 73, temperature 97.6 F (36.4 C), temperature source Oral, resp. rate 17, height 5\' 9"  (1.753 m), weight 159 lb 6.4 oz (72.3 kg), SpO2 100 %.  ECOG PERFORMANCE STATUS: 1 - Symptomatic but completely ambulatory  Physical Exam  Constitutional: Oriented to person,  place, and time and well-developed, well-nourished, and in no distress. No distress.  HENT:  Head: Normocephalic and atraumatic.  Mouth/Throat: Oropharynx is clear and moist. No oropharyngeal exudate.  Eyes: Conjunctivae are normal. Right eye exhibits no discharge. Left eye exhibits no discharge. No scleral icterus.  Neck: Normal range of motion. Neck supple.  Cardiovascular: Normal rate, regular rhythm, normal heart sounds and intact distal pulses.   Pulmonary/Chest: Effort normal and breath sounds normal. No respiratory distress. No wheezes. No rales.  Abdominal: Soft. Bowel sounds are normal. Exhibits no distension and no mass. There is no tenderness.  Musculoskeletal: Normal range of motion. Exhibits no edema.  Lymphadenopathy:    No cervical adenopathy.  Neurological: Alert and oriented to person, place, and time. Exhibits normal muscle tone. Gait normal. Coordination normal.  Skin: Skin is warm and dry. No rash noted. Not diaphoretic. No erythema. No pallor.  Psychiatric: Mood, memory and judgment normal.  Vitals reviewed.  LABORATORY DATA: Lab Results  Component Value Date   WBC 5.0 02/08/2018   HGB 12.6 (L) 02/08/2018   HCT 37.2 (L) 02/08/2018   MCV 85.9 02/08/2018   PLT 182 02/08/2018      Chemistry  Component Value Date/Time   NA 138 02/08/2018 1120   NA 136 05/04/2017 1036   K 4.2 02/08/2018 1120   K 4.0 05/04/2017 1036   CL 108 02/08/2018 1120   CO2 22 02/08/2018 1120   CO2 16 (L) 05/04/2017 1036   BUN 12 02/08/2018 1120   BUN 15.5 05/04/2017 1036   CREATININE 1.22 02/08/2018 1120   CREATININE 1.1 05/04/2017 1036      Component Value Date/Time   CALCIUM 9.4 02/08/2018 1120   CALCIUM 9.6 05/04/2017 1036   ALKPHOS 66 02/08/2018 1120   ALKPHOS 93 05/04/2017 1036   AST 15 02/08/2018 1120   AST 13 05/04/2017 1036   ALT 12 02/08/2018 1120   ALT 7 05/04/2017 1036   BILITOT 0.5 02/08/2018 1120   BILITOT 0.56 05/04/2017 1036       RADIOGRAPHIC  STUDIES:  Ct Chest W Contrast  Result Date: 02/07/2018 CLINICAL DATA:  Non-small cell lung cancer, chemo radiation. EXAM: CT CHEST WITH CONTRAST TECHNIQUE: Multidetector CT imaging of the chest was performed during intravenous contrast administration. CONTRAST:  19mL OMNIPAQUE IOHEXOL 300 MG/ML  SOLN COMPARISON:  11/15/2017 and CT abdomen pelvis 07/13/2017. FINDINGS: Cardiovascular: Atherosclerotic calcification of the arterial vasculature, including coronary arteries. Heart size normal. No pericardial effusion. Mediastinum/Nodes: No pathologically enlarged mediastinal, left hilar or axillary lymph nodes. Esophagus is grossly unremarkable. Lungs/Pleura: Centrilobular and paraseptal emphysema. Fluent soft tissue, patchy ground-glass, bronchiectasis and architectural distortion are seen in the medial right hemithorax, related to radiation therapy, stable. Scarring extends into the anterior right lower lobe, stable. No pleural fluid. Airway is otherwise unremarkable. Upper Abdomen: Visualized portions of the liver, gallbladder and adrenal glands are unremarkable. Partially imaged 2.0 cm stone is seen in the right renal pelvis with calyceal dilatation or mild hydronephrosis. 11 mm low-attenuation lesion in the pole right kidney is too small to characterize but a cyst is most likely. Visualized portions of the left kidney, spleen, pancreas, stomach and bowel are grossly unremarkable. No upper abdominal adenopathy. Musculoskeletal: Degenerative changes in the spine. No worrisome lytic or sclerotic lesions. IMPRESSION: 1. Post treatment scarring in the medial right hemithorax without evidence of recurrent or metastatic disease. 2. Aortic atherosclerosis (ICD10-170.0). Coronary artery calcification. 3.  Emphysema (ICD10-J43.9). 4. Partially imaged right renal pelvis stone with caliceal dilatation or mild hydronephrosis, stable from 07/13/2017. Electronically Signed   By: Lorin Picket M.D.   On: 02/07/2018 09:39      ASSESSMENT/PLAN:  Adenocarcinoma of right lung, stage 3 ([place]) This is a very pleasant 72 year old white male with a stage IIIa non-small cell lung cancer, squamous cell carcinoma  The patient underwent a course of concurrent chemoradiation with weekly carboplatin and paclitaxel status post 7 cycles. He had partial response after this treatment.  The patient is currently undergoing consolidation immunotherapy was Imfinzi (Durvalumab) status post 18cycles. He continues to tolerate his treatment well with no concerning adverse effects. He had a restaging CT scan of the chest.  I reviewed the CT scan with the patient and his daughter today.  We discussed that there was no evidence of disease progression.  Recommend that he continue on Imfinzi every 2 weeks. He will proceed with cycle #19 today as scheduled.  The patient will follow-up in 2 weeks for evaluation prior to cycle #20.  The patient continues to smoke. He was strongly advised to stop smoking.  He was advised to call immediately if he has any concerning symptoms in the interval. The patient voices understanding of current  disease status and treatment options and is in agreement with the current care plan. All questions were answered. The patient knows to call the clinic with any problems, questions or concerns. We can certainly see the patient much sooner if necessary.   Orders Placed This Encounter  Procedures  . CBC with Differential (Cancer Center Only)    Standing Status:   Standing    Number of Occurrences:   20    Standing Expiration Date:   02/09/2019  . CMP ([facility] only)    Standing Status:   Standing    Number of Occurrences:   20    Standing Expiration Date:   02/09/2019  . TSH    Standing Status:   Standing    Number of Occurrences:   12    Standing Expiration Date:   02/09/2019     Mikey Bussing, DNP, AGPCNP-BC, AOCNP 02/08/18

## 2018-02-08 NOTE — Assessment & Plan Note (Addendum)
This is a very pleasant 72 year old white male with a stage IIIa non-small cell lung cancer, squamous cell carcinoma  The patient underwent a course of concurrent chemoradiation with weekly carboplatin and paclitaxel status post 7 cycles. He had partial response after this treatment.  The patient is currently undergoing consolidation immunotherapy was Imfinzi (Durvalumab) status post 18cycles. He continues to tolerate his treatment well with no concerning adverse effects. He had a restaging CT scan of the chest.  I reviewed the CT scan with the patient and his daughter today.  We discussed that there was no evidence of disease progression.  Recommend that he continue on Imfinzi every 2 weeks. He will proceed with cycle #19 today as scheduled.  The patient will follow-up in 2 weeks for evaluation prior to cycle #20.  The patient continues to smoke. He was strongly advised to stop smoking.  He was advised to call immediately if he has any concerning symptoms in the interval. The patient voices understanding of current disease status and treatment options and is in agreement with the current care plan. All questions were answered. The patient knows to call the clinic with any problems, questions or concerns. We can certainly see the patient much sooner if necessary.

## 2018-02-08 NOTE — Patient Instructions (Signed)
[facility] Discharge Instructions for Patients Receiving Chemotherapy  Today you received the following chemotherapy agents: Imfinzi.  To help prevent nausea and vomiting after your treatment, we encourage you to take your nausea medication as directed.   If you develop nausea and vomiting that is not controlled by your nausea medication, call the clinic.   BELOW ARE SYMPTOMS THAT SHOULD BE REPORTED IMMEDIATELY:  *FEVER GREATER THAN 100.5 F  *CHILLS WITH OR WITHOUT FEVER  NAUSEA AND VOMITING THAT IS NOT CONTROLLED WITH YOUR NAUSEA MEDICATION  *UNUSUAL SHORTNESS OF BREATH  *UNUSUAL BRUISING OR BLEEDING  TENDERNESS IN MOUTH AND THROAT WITH OR WITHOUT PRESENCE OF ULCERS  *URINARY PROBLEMS  *BOWEL PROBLEMS  UNUSUAL RASH Items with * indicate a potential emergency and should be followed up as soon as possible.  Feel free to call the clinic should you have any questions or concerns. The clinic phone number is [phone].  Please show the CHEMO ALERT CARD at check-in to the Emergency Department and triage nurse.   

## 2018-02-12 ENCOUNTER — Other Ambulatory Visit: Payer: Self-pay | Admitting: Oncology

## 2018-02-20 ENCOUNTER — Other Ambulatory Visit: Payer: Medicare HMO

## 2018-02-20 ENCOUNTER — Ambulatory Visit: Payer: Medicare HMO | Admitting: Oncology

## 2018-02-21 ENCOUNTER — Emergency Department (HOSPITAL_COMMUNITY): Payer: Medicare HMO

## 2018-02-21 ENCOUNTER — Ambulatory Visit: Payer: Medicare HMO

## 2018-02-21 ENCOUNTER — Encounter (HOSPITAL_COMMUNITY): Payer: Self-pay | Admitting: *Deleted

## 2018-02-21 ENCOUNTER — Other Ambulatory Visit: Payer: Self-pay

## 2018-02-21 ENCOUNTER — Emergency Department (HOSPITAL_COMMUNITY)
Admission: EM | Admit: 2018-02-21 | Discharge: 2018-02-21 | Disposition: A | Discharge status: Home / Self Care | Payer: Medicare HMO | Attending: Emergency Medicine | Admitting: Emergency Medicine

## 2018-02-21 DIAGNOSIS — K5792 Diverticulitis of intestine, part unspecified, without perforation or abscess without bleeding: Secondary | ICD-10-CM

## 2018-02-21 DIAGNOSIS — Z79899 Other long term (current) drug therapy: Secondary | ICD-10-CM | POA: Diagnosis not present

## 2018-02-21 DIAGNOSIS — Z8673 Personal history of transient ischemic attack (TIA), and cerebral infarction without residual deficits: Secondary | ICD-10-CM | POA: Diagnosis not present

## 2018-02-21 DIAGNOSIS — I2510 Atherosclerotic heart disease of native coronary artery without angina pectoris: Secondary | ICD-10-CM | POA: Diagnosis not present

## 2018-02-21 DIAGNOSIS — K4090 Unilateral inguinal hernia, without obstruction or gangrene, not specified as recurrent: Secondary | ICD-10-CM | POA: Diagnosis not present

## 2018-02-21 DIAGNOSIS — I10 Essential (primary) hypertension: Secondary | ICD-10-CM | POA: Diagnosis not present

## 2018-02-21 DIAGNOSIS — R112 Nausea with vomiting, unspecified: Secondary | ICD-10-CM | POA: Diagnosis not present

## 2018-02-21 DIAGNOSIS — Z7901 Long term (current) use of anticoagulants: Secondary | ICD-10-CM | POA: Insufficient documentation

## 2018-02-21 DIAGNOSIS — R109 Unspecified abdominal pain: Secondary | ICD-10-CM

## 2018-02-21 DIAGNOSIS — Z85118 Personal history of other malignant neoplasm of bronchus and lung: Secondary | ICD-10-CM | POA: Diagnosis not present

## 2018-02-21 DIAGNOSIS — F17210 Nicotine dependence, cigarettes, uncomplicated: Secondary | ICD-10-CM | POA: Insufficient documentation

## 2018-02-21 DIAGNOSIS — Z7984 Long term (current) use of oral hypoglycemic drugs: Secondary | ICD-10-CM | POA: Diagnosis not present

## 2018-02-21 DIAGNOSIS — E119 Type 2 diabetes mellitus without complications: Secondary | ICD-10-CM | POA: Insufficient documentation

## 2018-02-21 DIAGNOSIS — R69 Illness, unspecified: Secondary | ICD-10-CM | POA: Diagnosis not present

## 2018-02-21 DIAGNOSIS — R079 Chest pain, unspecified: Secondary | ICD-10-CM | POA: Diagnosis not present

## 2018-02-21 LAB — CBC
HCT: 42.9 % (ref 39.0–52.0)
HEMOGLOBIN: 13.9 g/dL (ref 13.0–17.0)
MCH: 28 pg (ref 26.0–34.0)
MCHC: 32.4 g/dL (ref 30.0–36.0)
MCV: 86.3 fL (ref 80.0–100.0)
Platelets: 260 10*3/uL (ref 150–400)
RBC: 4.97 MIL/uL (ref 4.22–5.81)
RDW: 14.4 % (ref 11.5–15.5)
WBC: 11.5 10*3/uL — ABNORMAL HIGH (ref 4.0–10.5)
nRBC: 0 % (ref 0.0–0.2)

## 2018-02-21 LAB — URINALYSIS, ROUTINE W REFLEX MICROSCOPIC
BILIRUBIN URINE: NEGATIVE
Bacteria, UA: NONE SEEN
Glucose, UA: >=500 mg/dL — AB
Ketones, ur: NEGATIVE mg/dL
Leukocytes, UA: NEGATIVE
NITRITE: NEGATIVE
PROTEIN: NEGATIVE mg/dL
Specific Gravity, Urine: >1.046 — ABNORMAL HIGH (ref 1.005–1.030)
pH: 7 (ref 5.0–8.0)

## 2018-02-21 LAB — COMPREHENSIVE METABOLIC PANEL
ALBUMIN: 4.7 g/dL (ref 3.5–5.0)
ALT: 13 U/L (ref 0–44)
ANION GAP: 12 (ref 5–15)
AST: 26 U/L (ref 15–41)
Alkaline Phosphatase: 67 U/L (ref 38–126)
BILIRUBIN TOTAL: 0.8 mg/dL (ref 0.3–1.2)
BUN: 14 mg/dL (ref 8–23)
CHLORIDE: 105 mmol/L (ref 98–111)
CO2: 21 mmol/L — ABNORMAL LOW (ref 22–32)
Calcium: 9.5 mg/dL (ref 8.9–10.3)
Creatinine, Ser: 1.14 mg/dL (ref 0.61–1.24)
GFR calc Af Amer: >60 mL/min (ref >60)
GFR calc non Af Amer: >60 mL/min (ref >60)
GLUCOSE: 274 mg/dL — AB (ref 70–99)
POTASSIUM: 4.2 mmol/L (ref 3.5–5.1)
SODIUM: 138 mmol/L (ref 135–145)
TOTAL PROTEIN: 8.1 g/dL (ref 6.5–8.1)

## 2018-02-21 LAB — LIPASE, BLOOD: LIPASE: 24 U/L (ref 11–51)

## 2018-02-21 LAB — TROPONIN I

## 2018-02-21 MED ORDER — SODIUM CHLORIDE 0.9 % IV BOLUS
500.0000 mL | Freq: Once | INTRAVENOUS | Status: AC
  Administered 2018-02-21: 500 mL via INTRAVENOUS

## 2018-02-21 MED ORDER — ONDANSETRON HCL 4 MG PO TABS: 4.0000 mg | ORAL_TABLET | Freq: Four times a day (QID) | ORAL | 0 refills | Status: AC | PRN

## 2018-02-21 MED ORDER — HYDROCODONE-ACETAMINOPHEN 5-325 MG PO TABS
1.0000 | ORAL_TABLET | Freq: Once | ORAL | Status: AC
  Administered 2018-02-21: 1 via ORAL
  Filled 2018-02-21: qty 1

## 2018-02-21 MED ORDER — MORPHINE SULFATE (PF) 2 MG/ML IV SOLN
2.0000 mg | Freq: Once | INTRAVENOUS | Status: AC
  Administered 2018-02-21: 2 mg via INTRAVENOUS
  Filled 2018-02-21: qty 1

## 2018-02-21 MED ORDER — METRONIDAZOLE 500 MG PO TABS: 500.0000 mg | ORAL_TABLET | Freq: Two times a day (BID) | ORAL | 0 refills | Status: DC

## 2018-02-21 MED ORDER — IOPAMIDOL (ISOVUE-300) INJECTION 61%
100.0000 mL | Freq: Once | INTRAVENOUS | Status: AC | PRN
  Administered 2018-02-21: 100 mL via INTRAVENOUS

## 2018-02-21 MED ORDER — METRONIDAZOLE IN NACL 5-0.79 MG/ML-% IV SOLN
500.0000 mg | Freq: Once | INTRAVENOUS | Status: AC
  Administered 2018-02-21: 500 mg via INTRAVENOUS
  Filled 2018-02-21: qty 100

## 2018-02-21 MED ORDER — ONDANSETRON HCL 4 MG/2ML IJ SOLN
4.0000 mg | Freq: Once | INTRAMUSCULAR | Status: AC
  Administered 2018-02-21: 4 mg via INTRAVENOUS
  Filled 2018-02-21: qty 2

## 2018-02-21 MED ORDER — CIPROFLOXACIN HCL 500 MG PO TABS: 500.0000 mg | ORAL_TABLET | Freq: Two times a day (BID) | ORAL | 0 refills | Status: DC

## 2018-02-21 MED ORDER — CIPROFLOXACIN IN D5W 400 MG/200ML IV SOLN
400.0000 mg | Freq: Once | INTRAVENOUS | Status: AC
  Administered 2018-02-21: 400 mg via INTRAVENOUS
  Filled 2018-02-21: qty 200

## 2018-02-21 NOTE — ED Provider Notes (Signed)
[facility] EMERGENCY DEPARTMENT Provider Note   CSN: [id number] Arrival date & time: 02/21/18  1436     History   Chief Complaint Chief Complaint  Patient presents with  . Chest Pain  . Abdominal Pain    HPI Matthew Wilkins is a 72 y.o. male.  HPI Patient states he developed central abdominal pain which he describes as burning radiating into his chest overnight.  States he had greater than 20 episodes of vomiting.  He has increased shortness of breath and chronic cough without sputum production. no fever or chills.  States he last had a bowel movement before symptoms began.  He has been passing gas since onset of symptoms.  No blood in vomit or stool. Past Medical History:  Diagnosis Date  . Adenocarcinoma of right lung, stage 3 ([place]) 11/17/2016  . Anxiety   . Arthritis   . Coronary artery disease   . Diabetes mellitus   . DVT (deep venous thrombosis) ([place])   . Encounter for antineoplastic chemotherapy 11/17/2016  . History of radiation therapy 11/28/16-01/06/17   right lung was treated to 60 Gy in 30 fractions  . Hypercholesterolemia   . Hypertension   . PAD (peripheral artery disease) ([place])   . SOB (shortness of breath)     Patient Active Problem List   Diagnosis Date Noted  . AKI (acute kidney injury) ([place]) 11/20/2017  . UTI (urinary tract infection) 11/20/2017  . Aphasia   . RUQ abdominal pain   . Hypothyroidism 07/27/2017  . HCAP (healthcare-associated pneumonia) 07/17/2017  . Diabetes mellitus due to underlying condition, uncontrolled ([place]) 07/17/2017  . Acute respiratory failure with hypoxia ([place]) 07/17/2017  . Encounter for antineoplastic immunotherapy 02/23/2017  . Adenocarcinoma of right lung, stage 3 ([place]) 11/17/2016  . Encounter for antineoplastic chemotherapy 11/17/2016  . Goals of care, counseling/discussion 11/17/2016  . Encounter for smoking cessation counseling 11/17/2016  . Left leg DVT ([place]) 08/31/2016  . Malnutrition of moderate degree 08/30/2016   . Thrombocytopenia ([place]) 08/29/2016  . PE (pulmonary thromboembolism) ([place]) 08/28/2016  . Pulmonary nodule 08/28/2016  . Ureteral stone with hydronephrosis 08/28/2016  . Chest pain 08/28/2016  . CAD (coronary artery disease)   . TIA (transient ischemic attack) 11/27/2011  . Research study patient 08/30/2011  . PAD (peripheral artery disease) ([place]) 06/30/2011  . PVD (peripheral vascular disease) ([place]) 01/10/2011  . COPD (chronic obstructive pulmonary disease) ([place]) 07/06/2010  . TOBACCO ABUSE 11/12/2008  . Coronary atherosclerosis 09/08/2008  . DM type 2 (diabetes mellitus, type 2) ([place]) 08/26/2008  . HYPERCHOLESTEROLEMIA  IIA 08/26/2008  . ANXIETY 08/26/2008  . PARKINSON'S DISEASE 08/26/2008  . Essential hypertension 08/26/2008  . ARTHRITIS 08/26/2008  . SHORTNESS OF BREATH 08/26/2008    Past Surgical History:  Procedure Laterality Date  . CARDIAC CATHETERIZATION N/A 06/10/2015   Procedure: Left Heart Cath and Coronary Angiography;  Surgeon: Burnell Blanks, MD;  Location: [facility];  Service: Cardiovascular;  Laterality: N/A;  . LOWER EXTREMITY ANGIOGRAM N/A 07/13/2011   Procedure: LOWER EXTREMITY ANGIOGRAM;  Surgeon: Burnell Blanks, MD;  Location: [facility] CATH LAB;  Service: Cardiovascular;  Laterality: N/A;  . OTHER SURGICAL HISTORY    . OTHER SURGICAL HISTORY    . OTHER SURGICAL HISTORY    . PERCUTANEOUS STENT INTERVENTION Right 07/13/2011   Procedure: PERCUTANEOUS STENT INTERVENTION;  Surgeon: Burnell Blanks, MD;  Location: [facility] CATH LAB;  Service: Cardiovascular;  Laterality: Right;        Home Medications  Prior to Admission medications   Medication Sig Start Date End Date Taking? Authorizing Provider  apixaban (ELIQUIS) 5 MG TABS tablet Take 1 tablet (5 mg total) by mouth 2 (two) times daily. 07/17/17  Yes Dhungel, Nishant, MD  metFORMIN (GLUCOPHAGE-XR) 500 MG 24 hr tablet Take 2 tablets by mouth 2 (two) times daily. 04/19/16  Yes [provider]  albuterol (PROVENTIL HFA;VENTOLIN HFA) 108 (90 Base) MCG/ACT inhaler Inhale 1-2 puffs into the lungs every 6 (six) hours as needed for wheezing or shortness of breath.  04/21/17   [provider]  ciprofloxacin (CIPRO) 500 MG tablet Take 1 tablet (500 mg total) by mouth 2 (two) times daily. One po bid x 7 days 02/21/18   Julianne Rice, MD  dicyclomine (BENTYL) 20 MG tablet Take 1 tablet by mouth 3 (three) times daily. 10/26/17   [provider]  feeding supplement, GLUCERNA SHAKE, (GLUCERNA SHAKE) LIQD Take 237 mLs by mouth 3 (three) times daily between meals. Patient not taking: Reported on 02/08/2018 07/17/17   Dhungel, Flonnie Overman, MD  Fluticasone-Salmeterol (ADVAIR DISKUS) 250-50 MCG/DOSE AEPB Inhale 1 puff into the lungs every 12 (twelve) hours.  04/21/17   [provider]  gabapentin (NEURONTIN) 300 MG capsule Take 1 capsule by mouth 3 (three) times daily. 04/19/16   [provider]  glipiZIDE (GLUCOTROL XL) 5 MG 24 hr tablet Take 10 mg by mouth 2 (two) times daily.  03/11/15   [provider]  guaiFENesin (MUCINEX) 600 MG 12 hr tablet Take 1 tablet (600 mg total) by mouth 2 (two) times daily. 07/17/17 07/17/18  Dhungel, Nishant, MD  guaiFENesin-dextromethorphan (ROBITUSSIN DM) 100-10 MG/5ML syrup Take 5 mLs by mouth every 4 (four) hours as needed for cough. 07/17/17   Dhungel, Nishant, MD  levothyroxine (SYNTHROID, LEVOTHROID) 100 MCG tablet TAKE 1 TABLET DAILY BEFORE BREAKFAST. 02/12/18   Maryanna Shape, NP  metroNIDAZOLE (FLAGYL) 500 MG tablet Take 1 tablet (500 mg total) by mouth 2 (two) times daily. One po bid x 7 days 02/21/18   Julianne Rice, MD  omeprazole (PRILOSEC) 40 MG capsule Take 1 capsule by mouth daily. 10/26/17 10/26/18  [provider]  ondansetron (ZOFRAN) 4 MG tablet Take 1 tablet (4 mg total) by mouth every 6 (six) hours as needed. 02/21/18   Julianne Rice, MD  rosuvastatin (CRESTOR) 20 MG tablet Take 1 tablet (20 mg  total) by mouth daily. 10/07/14   Burnell Blanks, MD  sertraline (ZOLOFT) 25 MG tablet Take 50 mg by mouth daily.  10/27/16   [provider]  ZETIA 10 MG tablet Take 10 mg by mouth daily. 04/03/15   [provider]    Family History Family History  Family history unknown: Yes    Social History Social History   Tobacco Use  . Smoking status: Current Every Day Smoker    Packs/day: 1.00    Years: 65.00    Pack years: 65.00    Types: Cigarettes  . Smokeless tobacco: Never Used  Substance Use Topics  . Alcohol use: No  . Drug use: No     Allergies   Aspirin   Review of Systems Review of Systems  Constitutional: Negative for chills and fever.  Respiratory: Positive for shortness of breath. Negative for cough.   Cardiovascular: Positive for chest pain. Negative for palpitations and leg swelling.  Gastrointestinal: Positive for abdominal pain, nausea and vomiting. Negative for blood in stool, constipation and diarrhea.  Musculoskeletal: Negative for back pain, myalgias and neck  pain.  Skin: Negative for rash and wound.  Neurological: Negative for dizziness, weakness, light-headedness, numbness and headaches.  All other systems reviewed and are negative.    Physical Exam Updated Vital Signs BP 129/77   Pulse 80   Temp (!) 97.5 F (36.4 C) (Oral)   Resp 18   Ht 5\' 9"  (1.753 m)   Wt 72.3 kg   SpO2 98%   BMI 23.54 kg/m   Physical Exam  Constitutional: He is oriented to person, place, and time. He appears well-developed and well-nourished.  HENT:  Head: Normocephalic and atraumatic.  Mouth/Throat: Oropharynx is clear and moist. No oropharyngeal exudate.  Eyes: Pupils are equal, round, and reactive to light. EOM are normal.  Neck: Normal range of motion. Neck supple.  Cardiovascular: Regular rhythm.  Tachycardia  Pulmonary/Chest: Effort normal.  Tachypnea  Abdominal: Soft. Bowel sounds are normal. There is tenderness. There is no rebound  and no guarding.  Periumbilical, epigastric tenderness to palpation.  No rebound or guarding.  Musculoskeletal: Normal range of motion. He exhibits no edema or tenderness.  No midline thoracic or lumbar tenderness.  No CVA tenderness.  No lower extremity swelling, asymmetry or tenderness.  Distal pulses intact.  Neurological: He is alert and oriented to person, place, and time.  Moving all extremities without focal deficit.  Sensation intact.  Skin: Skin is warm and dry. Capillary refill takes less than 2 seconds. No rash noted. No erythema.  Psychiatric: He has a normal mood and affect. His behavior is normal.  Nursing note and vitals reviewed.    ED Treatments / Results  Labs (all labs ordered are listed, but only abnormal results are displayed) Labs Reviewed  COMPREHENSIVE METABOLIC PANEL - Abnormal; Notable for the following components:      Result Value   CO2 21 (*)    Glucose, Bld 274 (*)    All other components within normal limits  CBC - Abnormal; Notable for the following components:   WBC 11.5 (*)    All other components within normal limits  URINALYSIS, ROUTINE W REFLEX MICROSCOPIC - Abnormal; Notable for the following components:   Specific Gravity, Urine >1.046 (*)    Glucose, UA >=500 (*)    Hgb urine dipstick SMALL (*)    All other components within normal limits  TROPONIN I  LIPASE, BLOOD  TROPONIN I    EKG EKG Interpretation  Date/Time:  Wednesday February 21 2018 14:50:43 EDT Ventricular Rate:  98 PR Interval:    QRS Duration: 95 QT Interval:  370 QTC Calculation: 473 R Axis:   59 Text Interpretation:  Sinus rhythm Nonspecific T abnormalities, lateral leads Confirmed by Julianne Rice [phone]) on 02/21/2018 3:17:26 PM   Radiology Dg Chest 2 View  Result Date: 02/21/2018 CLINICAL DATA:  Chest pain EXAM: CHEST - 2 VIEW COMPARISON:  11/19/2017 chest x-ray, 02/06/2018 chest CT FINDINGS: Cardiac shadows within normal limits. The lungs are well aerated  bilaterally. Slight improvement in the degree of right middle and lower lobe density related to prior radiation therapy. No acute infiltrate or sizable effusion is noted. No acute bony abnormality is seen. IMPRESSION: Post radiation changes in the right middle and lower lobes although improved when compared with the prior chest x-ray. No acute abnormality noted. Electronically Signed   By: Inez Catalina M.D.   On: 02/21/2018 15:55   Ct Abdomen Pelvis W Contrast  Result Date: 02/21/2018 CLINICAL DATA:  Generalized lower abdomen pain. History of lung cancer in 2018. EXAM: CT ABDOMEN  AND PELVIS WITH CONTRAST TECHNIQUE: Multidetector CT imaging of the abdomen and pelvis was performed using the standard protocol following bolus administration of intravenous contrast. CONTRAST:  162mL ISOVUE-300 IOPAMIDOL (ISOVUE-300) INJECTION 61% COMPARISON:  Oct 02, 2009, chest CT August 28, 2016 FINDINGS: Lower chest: Patchy opacity is identified in the superior right lower lobe on image 1, incompletely included. The heart size is normal. There is no pleural effusion. Hepatobiliary: No focal liver abnormality is seen. No gallstones, gallbladder wall thickening, or biliary dilatation. Pancreas: Unremarkable. No pancreatic ductal dilatation or surrounding inflammatory changes. Spleen: Normal in size without focal abnormality. Adrenals/Urinary Tract: Adrenal glands are unremarkable. Kidneys are normal, without renal calculi or hydronephrosis. Right kidney cysts is identified. Bladder is unremarkable. Stomach/Bowel: There is minimal stranding surrounding the mid descending colon on image 37 series 2. There is diverticulosis of colon. The small bowel is normal. The appendix is normal. There is a small hiatal hernia. Fluid-filled stomach is identified. Vascular/Lymphatic: Aortic atherosclerosis. No enlarged abdominal or pelvic lymph nodes. Reproductive: Prostate calcifications are noted. Other: Small bilateral inguinal herniation of  mesenteric fat are noted. Musculoskeletal: Degenerative joint changes of the spine are identified. IMPRESSION: Minimal stranding surrounding the mid descending colon. This can be seen in and early developing diverticulitis. Patchy opacity is identified in the superior right lower lobe on image 1, incompletely included. This is nonspecific. Electronically Signed   By: Abelardo Diesel M.D.   On: 02/21/2018 16:36    Procedures Procedures (including critical care time)  Medications Ordered in ED Medications  sodium chloride 0.9 % bolus 500 mL (0 mLs Intravenous Stopped 02/21/18 1618)  ondansetron (ZOFRAN) injection 4 mg (4 mg Intravenous Given 02/21/18 1545)  morphine 2 MG/ML injection 2 mg (2 mg Intravenous Given 02/21/18 1545)  iopamidol (ISOVUE-300) 61 % injection 100 mL (100 mLs Intravenous Contrast Given 02/21/18 1555)  ciprofloxacin (CIPRO) IVPB 400 mg (0 mg Intravenous Stopped 02/21/18 1913)  metroNIDAZOLE (FLAGYL) IVPB 500 mg (0 mg Intravenous Stopped 02/21/18 2011)  HYDROcodone-acetaminophen (NORCO/VICODIN) 5-325 MG per tablet 1 tablet (1 tablet Oral Given 02/21/18 1935)  ondansetron (ZOFRAN) injection 4 mg (4 mg Intravenous Given 02/21/18 1934)     Initial Impression / Assessment and Plan / ED Course  I have reviewed the triage vital signs and the nursing notes.  Pertinent labs & imaging results that were available during my care of the patient were reviewed by me and considered in my medical decision making (see chart for details).     Patient is on Eliquis.  Low suspicion for PE.  Does appear to have possible early diverticulitis on CT.  Mild elevation in white blood cell count.  Will start antibiotics.  Patient is comfortable appearing.  Vital signs are stable.  Plan to discharge home with oral antibiotics and close follow-up with primary physician.  Final Clinical Impressions(s) / ED Diagnoses   Final diagnoses:  Diverticulitis  Non-intractable vomiting with nausea, unspecified  vomiting type  Abdominal pain, unspecified abdominal location    ED Discharge Orders         Ordered    ondansetron (ZOFRAN) 4 MG tablet  Every 6 hours PRN     02/21/18 2152    ciprofloxacin (CIPRO) 500 MG tablet  2 times daily     02/21/18 2152    metroNIDAZOLE (FLAGYL) 500 MG tablet  2 times daily     02/21/18 2152           Julianne Rice, MD 02/22/18 1622

## 2018-02-21 NOTE — ED Notes (Signed)
Patient transported to X-ray 

## 2018-02-21 NOTE — ED Triage Notes (Signed)
Pt c/o burning feeling that starts in his lower abdomen and radiates up to his chest along with vomiting and feeling tired since last night.

## 2018-02-23 ENCOUNTER — Inpatient Hospital Stay: Payer: Medicare HMO

## 2018-02-23 ENCOUNTER — Observation Stay (HOSPITAL_COMMUNITY)
Admission: EM | Admit: 2018-02-23 | Discharge: 2018-02-24 | Disposition: A | Discharge status: Home / Self Care | Payer: Medicare HMO | Attending: Internal Medicine | Admitting: Internal Medicine

## 2018-02-23 ENCOUNTER — Encounter (HOSPITAL_COMMUNITY): Payer: Self-pay | Admitting: Emergency Medicine

## 2018-02-23 ENCOUNTER — Other Ambulatory Visit: Payer: Self-pay

## 2018-02-23 ENCOUNTER — Inpatient Hospital Stay: Payer: Medicare HMO | Attending: Internal Medicine

## 2018-02-23 ENCOUNTER — Telehealth: Payer: Self-pay | Admitting: Medical Oncology

## 2018-02-23 ENCOUNTER — Inpatient Hospital Stay: Payer: Medicare HMO | Admitting: Internal Medicine

## 2018-02-23 DIAGNOSIS — E119 Type 2 diabetes mellitus without complications: Secondary | ICD-10-CM | POA: Diagnosis not present

## 2018-02-23 DIAGNOSIS — C3491 Malignant neoplasm of unspecified part of right bronchus or lung: Secondary | ICD-10-CM | POA: Diagnosis not present

## 2018-02-23 DIAGNOSIS — R630 Anorexia: Secondary | ICD-10-CM | POA: Diagnosis not present

## 2018-02-23 DIAGNOSIS — K2100 Gastro-esophageal reflux disease with esophagitis, without bleeding: Secondary | ICD-10-CM | POA: Diagnosis present

## 2018-02-23 DIAGNOSIS — K5793 Diverticulitis of intestine, part unspecified, without perforation or abscess with bleeding: Secondary | ICD-10-CM | POA: Diagnosis not present

## 2018-02-23 DIAGNOSIS — K209 Esophagitis, unspecified: Secondary | ICD-10-CM | POA: Insufficient documentation

## 2018-02-23 DIAGNOSIS — E039 Hypothyroidism, unspecified: Secondary | ICD-10-CM | POA: Diagnosis not present

## 2018-02-23 DIAGNOSIS — F17210 Nicotine dependence, cigarettes, uncomplicated: Secondary | ICD-10-CM | POA: Diagnosis not present

## 2018-02-23 DIAGNOSIS — C342 Malignant neoplasm of middle lobe, bronchus or lung: Secondary | ICD-10-CM | POA: Insufficient documentation

## 2018-02-23 DIAGNOSIS — N179 Acute kidney failure, unspecified: Secondary | ICD-10-CM | POA: Diagnosis not present

## 2018-02-23 DIAGNOSIS — Z79899 Other long term (current) drug therapy: Secondary | ICD-10-CM | POA: Insufficient documentation

## 2018-02-23 DIAGNOSIS — I10 Essential (primary) hypertension: Secondary | ICD-10-CM | POA: Diagnosis not present

## 2018-02-23 DIAGNOSIS — Z5112 Encounter for antineoplastic immunotherapy: Secondary | ICD-10-CM | POA: Insufficient documentation

## 2018-02-23 DIAGNOSIS — C3490 Malignant neoplasm of unspecified part of unspecified bronchus or lung: Secondary | ICD-10-CM | POA: Diagnosis not present

## 2018-02-23 DIAGNOSIS — Z86711 Personal history of pulmonary embolism: Secondary | ICD-10-CM | POA: Diagnosis present

## 2018-02-23 DIAGNOSIS — K5732 Diverticulitis of large intestine without perforation or abscess without bleeding: Secondary | ICD-10-CM | POA: Diagnosis not present

## 2018-02-23 DIAGNOSIS — J449 Chronic obstructive pulmonary disease, unspecified: Secondary | ICD-10-CM | POA: Diagnosis not present

## 2018-02-23 DIAGNOSIS — K5792 Diverticulitis of intestine, part unspecified, without perforation or abscess without bleeding: Secondary | ICD-10-CM | POA: Diagnosis present

## 2018-02-23 DIAGNOSIS — K219 Gastro-esophageal reflux disease without esophagitis: Secondary | ICD-10-CM | POA: Diagnosis not present

## 2018-02-23 DIAGNOSIS — K2210 Ulcer of esophagus without bleeding: Secondary | ICD-10-CM | POA: Diagnosis present

## 2018-02-23 DIAGNOSIS — R1084 Generalized abdominal pain: Secondary | ICD-10-CM | POA: Diagnosis not present

## 2018-02-23 DIAGNOSIS — R69 Illness, unspecified: Secondary | ICD-10-CM | POA: Diagnosis not present

## 2018-02-23 LAB — DIFFERENTIAL
BASOS ABS: 0.1 10*3/uL (ref 0.0–0.1)
Basophils Relative: 1 %
EOS ABS: 0.1 10*3/uL (ref 0.0–0.5)
Eosinophils Relative: 1 %
LYMPHS ABS: 0.9 10*3/uL (ref 0.7–4.0)
Lymphocytes Relative: 17 %
Monocytes Absolute: 0.5 10*3/uL (ref 0.1–1.0)
Monocytes Relative: 9 %
NEUTROS ABS: 3.6 10*3/uL (ref 1.7–7.7)
NEUTROS PCT: 71 %

## 2018-02-23 LAB — CBC
HCT: 39.4 % (ref 39.0–52.0)
Hemoglobin: 12.9 g/dL — ABNORMAL LOW (ref 13.0–17.0)
MCH: 28.2 pg (ref 26.0–34.0)
MCHC: 32.7 g/dL (ref 30.0–36.0)
MCV: 86 fL (ref 80.0–100.0)
Platelets: 205 10*3/uL (ref 150–400)
RBC: 4.58 MIL/uL (ref 4.22–5.81)
RDW: 14.4 % (ref 11.5–15.5)
WBC: 5.1 10*3/uL (ref 4.0–10.5)
nRBC: 0 % (ref 0.0–0.2)

## 2018-02-23 LAB — COMPREHENSIVE METABOLIC PANEL
ALBUMIN: 4.5 g/dL (ref 3.5–5.0)
ALK PHOS: 56 U/L (ref 38–126)
ALT: 13 U/L (ref 0–44)
AST: 22 U/L (ref 15–41)
Anion gap: 8 (ref 5–15)
BILIRUBIN TOTAL: 0.6 mg/dL (ref 0.3–1.2)
BUN: 22 mg/dL (ref 8–23)
CALCIUM: 9.2 mg/dL (ref 8.9–10.3)
CO2: 21 mmol/L — ABNORMAL LOW (ref 22–32)
CREATININE: 1.61 mg/dL — AB (ref 0.61–1.24)
Chloride: 104 mmol/L (ref 98–111)
GFR calc Af Amer: 47 mL/min — ABNORMAL LOW (ref >60)
GFR, EST NON AFRICAN AMERICAN: 41 mL/min — AB (ref >60)
Glucose, Bld: 225 mg/dL — ABNORMAL HIGH (ref 70–99)
Potassium: 3.9 mmol/L (ref 3.5–5.1)
Sodium: 133 mmol/L — ABNORMAL LOW (ref 135–145)
TOTAL PROTEIN: 7.6 g/dL (ref 6.5–8.1)

## 2018-02-23 LAB — LIPASE, BLOOD: LIPASE: 23 U/L (ref 11–51)

## 2018-02-23 MED ORDER — SODIUM CHLORIDE 0.9 % IV BOLUS
500.0000 mL | Freq: Once | INTRAVENOUS | Status: AC
  Administered 2018-02-23: 500 mL via INTRAVENOUS

## 2018-02-23 MED ORDER — CIPROFLOXACIN IN D5W 400 MG/200ML IV SOLN
400.0000 mg | Freq: Two times a day (BID) | INTRAVENOUS | Status: DC
  Administered 2018-02-24 (×2): 400 mg via INTRAVENOUS
  Filled 2018-02-23 (×2): qty 200

## 2018-02-23 MED ORDER — METRONIDAZOLE IN NACL 5-0.79 MG/ML-% IV SOLN
500.0000 mg | Freq: Three times a day (TID) | INTRAVENOUS | Status: DC
  Administered 2018-02-23 – 2018-02-24 (×3): 500 mg via INTRAVENOUS
  Filled 2018-02-23 (×3): qty 100

## 2018-02-23 NOTE — ED Triage Notes (Signed)
Patient here for recurrent abdominal pain, unable to eat last 3 days, seen for same 2 days ago.  Patient currently receiving chemo treatments and refuses to wear mask in public areas.

## 2018-02-23 NOTE — Telephone Encounter (Addendum)
Pt called back. I instructed him the importance of going back to ED tomorrow  if symptoms persist tomorrow. He then reports a red knot above his right wrist that is tender. I told him to go to Ed if the redness enlarges and or increasing pain in the area.

## 2018-02-23 NOTE — Telephone Encounter (Addendum)
Pt cancelled appt today -not feeling well -ED > " early diverticulitis" and started on antibiotics. I left message for pt to call back.

## 2018-02-23 NOTE — H&P (Signed)
History and Physical  AHMERE HEMENWAY YOM:[id number] DOB: 1945/10/09 DOA: 02/23/2018  Referring physician: Dr Dewayne Hatch, ED physician PCP: [name], MD   Patient Coming From: home  Chief Complaint: abdominal pain  HPI: Matthew Wilkins is a 72 y.o. male with a history of DM2, h/o DVT and PE, HTN, adenocarcinoma of right lung stage 3 on chemo, CAD, GERD. Patient seen on 10/9 and diagnosed with diverticulitis with CT scan. Prescribed cipro and flagyl. Has continued to have abdominal pain and not able to tolerate diet, but still taking his medications. Describes abdominal pain periumbilical and epigastric, radiating up into chest. Pain worsening. No palliating or provoking factors. Has been tolerating a few bites of tomato soup.  Emergency Department Course: WBC 6k. Cr 1.6.   Review of Systems:   Pt denies any fevers, chills, nausea, vomiting, diarrhea, constipation, shortness of breath, dyspnea on exertion, orthopnea, cough, wheezing, palpitations, headache, vision changes, lightheadedness, dizziness, melena, rectal bleeding.  Review of systems are otherwise negative  Past Medical History:  Diagnosis Date  . Adenocarcinoma of right lung, stage 3 ([place]) 11/17/2016  . Anxiety   . Arthritis   . Coronary artery disease   . Diabetes mellitus   . DVT (deep venous thrombosis) ([place])   . Encounter for antineoplastic chemotherapy 11/17/2016  . History of radiation therapy 11/28/16-01/06/17   right lung was treated to 60 Gy in 30 fractions  . Hypercholesterolemia   . Hypertension   . PAD (peripheral artery disease) ([place])   . SOB (shortness of breath)    Past Surgical History:  Procedure Laterality Date  . CARDIAC CATHETERIZATION N/A 06/10/2015   Procedure: Left Heart Cath and Coronary Angiography;  Surgeon: Burnell Blanks, MD;  Location: [place] CV LAB;  Service: Cardiovascular;  Laterality: N/A;  . LOWER EXTREMITY ANGIOGRAM N/A 07/13/2011   Procedure: LOWER EXTREMITY ANGIOGRAM;   Surgeon: Burnell Blanks, MD;  Location: [facility] CATH LAB;  Service: Cardiovascular;  Laterality: N/A;  . OTHER SURGICAL HISTORY    . OTHER SURGICAL HISTORY    . OTHER SURGICAL HISTORY    . PERCUTANEOUS STENT INTERVENTION Right 07/13/2011   Procedure: PERCUTANEOUS STENT INTERVENTION;  Surgeon: Burnell Blanks, MD;  Location: [facility] CATH LAB;  Service: Cardiovascular;  Laterality: Right;   Social History:  reports that he has been smoking cigarettes. He has a 65.00 pack-year smoking history. He has never used smokeless tobacco. He reports that he does not drink alcohol or use drugs. Patient lives at home  Allergies  Allergen Reactions  . Aspirin Other (See Comments)    Nose bleeds     Family History  Family history unknown: Yes      Prior to Admission medications   Medication Sig Start Date End Date Taking? Authorizing Provider  albuterol (PROVENTIL HFA;VENTOLIN HFA) 108 (90 Base) MCG/ACT inhaler Inhale 1-2 puffs into the lungs every 6 (six) hours as needed for wheezing or shortness of breath.  04/21/17   [provider]  apixaban (ELIQUIS) 5 MG TABS tablet Take 1 tablet (5 mg total) by mouth 2 (two) times daily. 07/17/17   Dhungel, Flonnie Overman, MD  ciprofloxacin (CIPRO) 500 MG tablet Take 1 tablet (500 mg total) by mouth 2 (two) times daily. One po bid x 7 days 02/21/18   Julianne Rice, MD  dicyclomine (BENTYL) 20 MG tablet Take 1 tablet by mouth 3 (three) times daily. 10/26/17   [provider]  feeding supplement, GLUCERNA SHAKE, (GLUCERNA SHAKE) LIQD Take 237 mLs by mouth  3 (three) times daily between meals. Patient not taking: Reported on 02/08/2018 07/17/17   Dhungel, Flonnie Overman, MD  Fluticasone-Salmeterol (ADVAIR DISKUS) 250-50 MCG/DOSE AEPB Inhale 1 puff into the lungs every 12 (twelve) hours.  04/21/17   [provider]  gabapentin (NEURONTIN) 300 MG capsule Take 1 capsule by mouth 3 (three) times daily. 04/19/16   [provider]  glipiZIDE (GLUCOTROL  XL) 5 MG 24 hr tablet Take 10 mg by mouth 2 (two) times daily.  03/11/15   [provider]  guaiFENesin (MUCINEX) 600 MG 12 hr tablet Take 1 tablet (600 mg total) by mouth 2 (two) times daily. 07/17/17 07/17/18  Dhungel, Nishant, MD  guaiFENesin-dextromethorphan (ROBITUSSIN DM) 100-10 MG/5ML syrup Take 5 mLs by mouth every 4 (four) hours as needed for cough. 07/17/17   Dhungel, Nishant, MD  levothyroxine (SYNTHROID, LEVOTHROID) 100 MCG tablet TAKE 1 TABLET DAILY BEFORE BREAKFAST. 02/12/18   Maryanna Shape, NP  metFORMIN (GLUCOPHAGE-XR) 500 MG 24 hr tablet Take 2 tablets by mouth 2 (two) times daily. 04/19/16   [provider]  metroNIDAZOLE (FLAGYL) 500 MG tablet Take 1 tablet (500 mg total) by mouth 2 (two) times daily. One po bid x 7 days 02/21/18   Julianne Rice, MD  omeprazole (PRILOSEC) 40 MG capsule Take 1 capsule by mouth daily. 10/26/17 10/26/18  [provider]  ondansetron (ZOFRAN) 4 MG tablet Take 1 tablet (4 mg total) by mouth every 6 (six) hours as needed. 02/21/18   Julianne Rice, MD  rosuvastatin (CRESTOR) 20 MG tablet Take 1 tablet (20 mg total) by mouth daily. 10/07/14   Burnell Blanks, MD  sertraline (ZOLOFT) 25 MG tablet Take 50 mg by mouth daily.  10/27/16   [provider]  ZETIA 10 MG tablet Take 10 mg by mouth daily. 04/03/15   [provider]    Physical Exam: BP 119/65 (BP Location: Right Arm)   Pulse 85   Temp 98.2 F (36.8 C) (Oral)   Resp 20   Ht 5\' 9"  (1.753 m)   Wt 72.3 kg   SpO2 100%   BMI 23.54 kg/m   . General: Elderly male. Awake and alert and oriented x3. No acute cardiopulmonary distress.  [name] HEENT: Normocephalic atraumatic.  Right and left ears normal in appearance.  Pupils equal, round, reactive to light. Extraocular muscles are intact. Sclerae anicteric and noninjected.  Moist mucosal membranes. No mucosal lesions.  . Neck: Neck supple without lymphadenopathy. No carotid bruits. No masses palpated.   . Cardiovascular: Regular rate with normal S1-S2 sounds. No murmurs, rubs, gallops auscultated. No JVD.  [name] Respiratory: Good respiratory effort with no wheezes, rales, rhonchi. Lungs clear to auscultation bilaterally.  No accessory muscle use. . Abdomen: Soft, tender in the periumbilical and epigastric tenderness. No rebound tenderness GUor guarding. Nondistended. Active bowel sounds. No masses or hepatosplenomegaly  . Skin: No rashes, lesions, or ulcerations.  Dry, warm to touch. 2+ dorsalis pedis and radial pulses. . Musculoskeletal: No calf or leg pain. All major joints not erythematous nontender.  No upper or lower joint deformation.  Good ROM.  No contractures  . Psychiatric: Intact judgment and insight. Pleasant and cooperative. . Neurologic: No focal neurological deficits. Strength is 5/5 and symmetric in upper and lower extremities.  Cranial nerves II through XII are grossly intact.           Labs on Admission: I have personally reviewed following labs and imaging studies  CBC: Recent Labs  Lab 02/21/18 1455 02/23/18  2014  WBC 11.5* 5.1  NEUTROABS  --  3.6  HGB 13.9 12.9*  HCT 42.9 39.4  MCV 86.3 86.0  PLT 260 413   Basic Metabolic Panel: Recent Labs  Lab 02/21/18 1455 02/23/18 2014  NA 138 133*  K 4.2 3.9  CL 105 104  CO2 21* 21*  GLUCOSE 274* 225*  BUN 14 22  CREATININE 1.14 1.61*  CALCIUM 9.5 9.2   GFR: Estimated Creatinine Clearance: 40.9 mL/min (A) (by C-G formula based on SCr of 1.61 mg/dL (H)). Liver Function Tests: Recent Labs  Lab 02/21/18 1455 02/23/18 2014  AST 26 22  ALT 13 13  ALKPHOS 67 56  BILITOT 0.8 0.6  PROT 8.1 7.6  ALBUMIN 4.7 4.5   Recent Labs  Lab 02/21/18 1455 02/23/18 2014  LIPASE 24 23   No results for input(s): AMMONIA in the last 168 hours. Coagulation Profile: No results for input(s): INR, PROTIME in the last 168 hours. Cardiac Enzymes: Recent Labs  Lab 02/21/18 1455 02/21/18 2123  TROPONINI <0.03 <0.03   BNP  (last 3 results) No results for input(s): PROBNP in the last 8760 hours. HbA1C: No results for input(s): HGBA1C in the last 72 hours. CBG: No results for input(s): GLUCAP in the last 168 hours. Lipid Profile: No results for input(s): CHOL, HDL, LDLCALC, TRIG, CHOLHDL, LDLDIRECT in the last 72 hours. Thyroid Function Tests: No results for input(s): TSH, T4TOTAL, FREET4, T3FREE, THYROIDAB in the last 72 hours. Anemia Panel: No results for input(s): VITAMINB12, FOLATE, FERRITIN, TIBC, IRON, RETICCTPCT in the last 72 hours. Urine analysis:    Component Value Date/Time   COLORURINE YELLOW 02/21/2018 1913   APPEARANCEUR CLEAR 02/21/2018 1913   LABSPEC >1.046 (H) 02/21/2018 1913   PHURINE 7.0 02/21/2018 1913   GLUCOSEU >=500 (A) 02/21/2018 1913   HGBUR SMALL (A) 02/21/2018 1913   BILIRUBINUR NEGATIVE 02/21/2018 1913   KETONESUR NEGATIVE 02/21/2018 1913   PROTEINUR NEGATIVE 02/21/2018 1913   UROBILINOGEN 0.2 01/27/2010 1529   NITRITE NEGATIVE 02/21/2018 1913   LEUKOCYTESUR NEGATIVE 02/21/2018 1913   Sepsis Labs: @LABRCNTIP (procalcitonin:4,lacticidven:4) )No results found for this or any previous visit (from the past 240 hour(s)).   Radiological Exams on Admission: No results found.  EKG: Independently reviewed.  Sinus rhythm with non-specific ST changes  Assessment/Plan: Principal Problem:   Acute renal injury ([place]) Active Problems:   DM type 2 (diabetes mellitus, type 2) ([place])   Essential hypertension   COPD (chronic obstructive pulmonary disease) (H[facility])   Adenocarcinoma of right lung, stage 3 (H[facility])   Hypothyroidism   Diverticulitis of intestine    This patient was discussed with the ED physician, including pertinent vitals, physical exam findings, labs, and imaging.  We also discussed care given by the ED provider.  1. Acute renal injury a. IV fluids b. Check creatinine in the morning 2. Diverticulitis a. Patient's white count is improved since Wednesday.  I do not  think that the patient's increased pain is secondary to the outpatient failure, but more likely secondary to GERD and acid reflux, particularly with acidic diet and antibiotics. b. We will give patient Flagyl Cipro IV 3. GERD a. Continue Protonix b. Start Pepcid IV twice daily 4. Hypertension a. Continue antihypertensives 5. Diabetes a. Continue diabetes medications 6. COPD a. No current signs of respiratory distress 7. Adenocarcinoma of lung  DVT prophylaxis: On Eliquis Consultants: None Code Status: Full code Family Communication: Daughter in the room Disposition Plan: Patient to return home following improvement of pain  Truett Mainland, DO [facility] Pager [phone]  If 7PM-7AM, please contact night-coverage [email or web address] Password [email or web address]

## 2018-02-23 NOTE — ED Provider Notes (Signed)
[facility] EMERGENCY DEPARTMENT Provider Note   CSN: [id number] Arrival date & time: 02/23/18  1949     History   Chief Complaint Chief Complaint  Patient presents with  . Abdominal Pain    HPI Matthew Wilkins is a 72 y.o. male.  Patient complains of continued abdominal pain.  He is also not been able to eat or drink anything for the last couple days.  Patient was diagnosed with diverticulitis 2 days ago  The history is provided by the patient. No language interpreter was used.  Abdominal Pain   This is a recurrent problem. The current episode started more than 2 days ago. The problem occurs constantly. The pain is associated with an unknown factor. The pain is located in the generalized abdominal region. The quality of the pain is aching. The pain is at a severity of 5/10. The pain is moderate. Associated symptoms include anorexia. Pertinent negatives include diarrhea, frequency, hematuria and headaches.    Past Medical History:  Diagnosis Date  . Adenocarcinoma of right lung, stage 3 ([place]) 11/17/2016  . Anxiety   . Arthritis   . Coronary artery disease   . Diabetes mellitus   . DVT (deep venous thrombosis) ([place])   . Encounter for antineoplastic chemotherapy 11/17/2016  . History of radiation therapy 11/28/16-01/06/17   right lung was treated to 60 Gy in 30 fractions  . Hypercholesterolemia   . Hypertension   . PAD (peripheral artery disease) ([place])   . SOB (shortness of breath)     Patient Active Problem List   Diagnosis Date Noted  . Acute renal injury ([place]) 02/23/2018  . Diverticulitis of intestine 02/23/2018  . AKI (acute kidney injury) ([place]) 11/20/2017  . UTI (urinary tract infection) 11/20/2017  . Aphasia   . RUQ abdominal pain   . Hypothyroidism 07/27/2017  . HCAP (healthcare-associated pneumonia) 07/17/2017  . Diabetes mellitus due to underlying condition, uncontrolled ([place]) 07/17/2017  . Acute respiratory failure with hypoxia ([place]) 07/17/2017  . Encounter  for antineoplastic immunotherapy 02/23/2017  . Adenocarcinoma of right lung, stage 3 ([place]) 11/17/2016  . Encounter for antineoplastic chemotherapy 11/17/2016  . Goals of care, counseling/discussion 11/17/2016  . Encounter for smoking cessation counseling 11/17/2016  . Left leg DVT ([place]) 08/31/2016  . Malnutrition of moderate degree 08/30/2016  . Thrombocytopenia ([place]) 08/29/2016  . PE (pulmonary thromboembolism) ([place]) 08/28/2016  . Pulmonary nodule 08/28/2016  . Ureteral stone with hydronephrosis 08/28/2016  . Chest pain 08/28/2016  . CAD (coronary artery disease)   . TIA (transient ischemic attack) 11/27/2011  . Research study patient 08/30/2011  . PAD (peripheral artery disease) ([place]) 06/30/2011  . PVD (peripheral vascular disease) ([place]) 01/10/2011  . COPD (chronic obstructive pulmonary disease) ([place]) 07/06/2010  . TOBACCO ABUSE 11/12/2008  . Coronary atherosclerosis 09/08/2008  . DM type 2 (diabetes mellitus, type 2) ([place]) 08/26/2008  . HYPERCHOLESTEROLEMIA  IIA 08/26/2008  . ANXIETY 08/26/2008  . PARKINSON'S DISEASE 08/26/2008  . Essential hypertension 08/26/2008  . ARTHRITIS 08/26/2008  . SHORTNESS OF BREATH 08/26/2008    Past Surgical History:  Procedure Laterality Date  . CARDIAC CATHETERIZATION N/A 06/10/2015   Procedure: Left Heart Cath and Coronary Angiography;  Surgeon: Burnell Blanks, MD;  Location: [place] CV LAB;  Service: Cardiovascular;  Laterality: N/A;  . LOWER EXTREMITY ANGIOGRAM N/A 07/13/2011   Procedure: LOWER EXTREMITY ANGIOGRAM;  Surgeon: Burnell Blanks, MD;  Location: [facility] CATH LAB;  Service: Cardiovascular;  Laterality: N/A;  . OTHER SURGICAL HISTORY    .  OTHER SURGICAL HISTORY    . OTHER SURGICAL HISTORY    . PERCUTANEOUS STENT INTERVENTION Right 07/13/2011   Procedure: PERCUTANEOUS STENT INTERVENTION;  Surgeon: Burnell Blanks, MD;  Location: [facility] CATH LAB;  Service: Cardiovascular;  Laterality: Right;        Home  Medications    Prior to Admission medications   Medication Sig Start Date End Date Taking? Authorizing Provider  albuterol (PROVENTIL HFA;VENTOLIN HFA) 108 (90 Base) MCG/ACT inhaler Inhale 1-2 puffs into the lungs every 6 (six) hours as needed for wheezing or shortness of breath.  04/21/17   [provider]  apixaban (ELIQUIS) 5 MG TABS tablet Take 1 tablet (5 mg total) by mouth 2 (two) times daily. 07/17/17   Dhungel, Flonnie Overman, MD  ciprofloxacin (CIPRO) 500 MG tablet Take 1 tablet (500 mg total) by mouth 2 (two) times daily. One po bid x 7 days 02/21/18   Julianne Rice, MD  dicyclomine (BENTYL) 20 MG tablet Take 1 tablet by mouth 3 (three) times daily. 10/26/17   [provider]  feeding supplement, GLUCERNA SHAKE, (GLUCERNA SHAKE) LIQD Take 237 mLs by mouth 3 (three) times daily between meals. Patient not taking: Reported on 02/08/2018 07/17/17   Dhungel, Flonnie Overman, MD  Fluticasone-Salmeterol (ADVAIR DISKUS) 250-50 MCG/DOSE AEPB Inhale 1 puff into the lungs every 12 (twelve) hours.  04/21/17   [provider]  gabapentin (NEURONTIN) 300 MG capsule Take 1 capsule by mouth 3 (three) times daily. 04/19/16   [provider]  glipiZIDE (GLUCOTROL XL) 5 MG 24 hr tablet Take 10 mg by mouth 2 (two) times daily.  03/11/15   [provider]  guaiFENesin (MUCINEX) 600 MG 12 hr tablet Take 1 tablet (600 mg total) by mouth 2 (two) times daily. 07/17/17 07/17/18  Dhungel, Nishant, MD  guaiFENesin-dextromethorphan (ROBITUSSIN DM) 100-10 MG/5ML syrup Take 5 mLs by mouth every 4 (four) hours as needed for cough. 07/17/17   Dhungel, Nishant, MD  levothyroxine (SYNTHROID, LEVOTHROID) 100 MCG tablet TAKE 1 TABLET DAILY BEFORE BREAKFAST. 02/12/18   Maryanna Shape, NP  metFORMIN (GLUCOPHAGE-XR) 500 MG 24 hr tablet Take 2 tablets by mouth 2 (two) times daily. 04/19/16   [provider]  metroNIDAZOLE (FLAGYL) 500 MG tablet Take 1 tablet (500 mg total) by mouth 2 (two) times daily.  One po bid x 7 days 02/21/18   Julianne Rice, MD  omeprazole (PRILOSEC) 40 MG capsule Take 1 capsule by mouth daily. 10/26/17 10/26/18  [provider]  ondansetron (ZOFRAN) 4 MG tablet Take 1 tablet (4 mg total) by mouth every 6 (six) hours as needed. 02/21/18   Julianne Rice, MD  rosuvastatin (CRESTOR) 20 MG tablet Take 1 tablet (20 mg total) by mouth daily. 10/07/14   Burnell Blanks, MD  sertraline (ZOLOFT) 25 MG tablet Take 50 mg by mouth daily.  10/27/16   [provider]  ZETIA 10 MG tablet Take 10 mg by mouth daily. 04/03/15   [provider]    Family History Family History  Family history unknown: Yes    Social History Social History   Tobacco Use  . Smoking status: Current Every Day Smoker    Packs/day: 1.00    Years: 65.00    Pack years: 65.00    Types: Cigarettes  . Smokeless tobacco: Never Used  Substance Use Topics  . Alcohol use: No  . Drug use: No     Allergies   Aspirin   Review of Systems Review of Systems  Constitutional:  Negative for appetite change and fatigue.  HENT: Negative for congestion, ear discharge and sinus pressure.   Eyes: Negative for discharge.  Respiratory: Negative for cough.   Cardiovascular: Negative for chest pain.  Gastrointestinal: Positive for abdominal pain and anorexia. Negative for diarrhea.  Genitourinary: Negative for frequency and hematuria.  Musculoskeletal: Negative for back pain.  Skin: Negative for rash.  Neurological: Negative for seizures and headaches.  Psychiatric/Behavioral: Negative for hallucinations.     Physical Exam Updated Vital Signs BP 119/65 (BP Location: Right Arm)   Pulse 85   Temp 98.2 F (36.8 C) (Oral)   Resp 20   Ht 5\' 9"  (1.753 m)   Wt 72.3 kg   SpO2 100%   BMI 23.54 kg/m   Physical Exam  Constitutional: He is oriented to person, place, and time. He appears well-developed.  HENT:  Head: Normocephalic.  Eyes: Conjunctivae and EOM are normal. No  scleral icterus.  Neck: Neck supple. No thyromegaly present.  Cardiovascular: Normal rate and regular rhythm. Exam reveals no gallop and no friction rub.  No murmur heard. Pulmonary/Chest: No stridor. He has no wheezes. He has no rales. He exhibits no tenderness.  Abdominal: He exhibits no distension. There is tenderness. There is no rebound.  Musculoskeletal: Normal range of motion. He exhibits no edema.  Lymphadenopathy:    He has no cervical adenopathy.  Neurological: He is oriented to person, place, and time. He exhibits normal muscle tone. Coordination normal.  Skin: No rash noted. No erythema.  Psychiatric: He has a normal mood and affect. His behavior is normal.     ED Treatments / Results  Labs (all labs ordered are listed, but only abnormal results are displayed) Labs Reviewed  COMPREHENSIVE METABOLIC PANEL - Abnormal; Notable for the following components:      Result Value   Sodium 133 (*)    CO2 21 (*)    Glucose, Bld 225 (*)    Creatinine, Ser 1.61 (*)    GFR calc non Af Amer 41 (*)    GFR calc Af Amer 47 (*)    All other components within normal limits  CBC - Abnormal; Notable for the following components:   Hemoglobin 12.9 (*)    All other components within normal limits  LIPASE, BLOOD  DIFFERENTIAL  URINALYSIS, ROUTINE W REFLEX MICROSCOPIC    EKG None  Radiology No results found.  Procedures Procedures (including critical care time)  Medications Ordered in ED Medications  metroNIDAZOLE (FLAGYL) IVPB 500 mg (has no administration in time range)  ciprofloxacin (CIPRO) IVPB 400 mg (has no administration in time range)  sodium chloride 0.9 % bolus 500 mL (500 mLs Intravenous New Bag/Given 02/23/18 2023)     Initial Impression / Assessment and Plan / ED Course  I have reviewed the triage vital signs and the nursing notes.  Pertinent labs & imaging results that were available during my care of the patient were reviewed by me and considered in my  medical decision making (see chart for details).     Patient with diverticulitis and AKI he will be admitted to medicine  Final Clinical Impressions(s) / ED Diagnoses   Final diagnoses:  AKI (acute kidney injury) [facility])    ED Discharge Orders    None       Milton Ferguson, MD 02/23/18 2159

## 2018-02-24 DIAGNOSIS — K2100 Gastro-esophageal reflux disease with esophagitis, without bleeding: Secondary | ICD-10-CM | POA: Diagnosis present

## 2018-02-24 DIAGNOSIS — K2210 Ulcer of esophagus without bleeding: Secondary | ICD-10-CM

## 2018-02-24 DIAGNOSIS — N179 Acute kidney failure, unspecified: Secondary | ICD-10-CM | POA: Diagnosis not present

## 2018-02-24 LAB — URINALYSIS, ROUTINE W REFLEX MICROSCOPIC
Bacteria, UA: NONE SEEN
Bilirubin Urine: NEGATIVE
Ketones, ur: 5 mg/dL — AB
Nitrite: NEGATIVE
PH: 6 (ref 5.0–8.0)
PROTEIN: NEGATIVE mg/dL
Specific Gravity, Urine: 1.026 (ref 1.005–1.030)

## 2018-02-24 LAB — BASIC METABOLIC PANEL
ANION GAP: 7 (ref 5–15)
Anion gap: 8 (ref 5–15)
BUN: 16 mg/dL (ref 8–23)
BUN: 14 mg/dL (ref 8–23)
CALCIUM: 8.5 mg/dL — AB (ref 8.9–10.3)
CHLORIDE: 107 mmol/L (ref 98–111)
CO2: 22 mmol/L (ref 22–32)
CO2: 20 mmol/L — ABNORMAL LOW (ref 22–32)
Calcium: 8.6 mg/dL — ABNORMAL LOW (ref 8.9–10.3)
Chloride: 106 mmol/L (ref 98–111)
Creatinine, Ser: 1.35 mg/dL — ABNORMAL HIGH (ref 0.61–1.24)
Creatinine, Ser: 1.28 mg/dL — ABNORMAL HIGH (ref 0.61–1.24)
GFR calc Af Amer: >60 mL/min (ref >60)
GFR, EST AFRICAN AMERICAN: 59 mL/min — AB (ref >60)
GFR, EST NON AFRICAN AMERICAN: 50 mL/min — AB (ref >60)
GFR, EST NON AFRICAN AMERICAN: 54 mL/min — AB (ref >60)
Glucose, Bld: 230 mg/dL — ABNORMAL HIGH (ref 70–99)
Glucose, Bld: 199 mg/dL — ABNORMAL HIGH (ref 70–99)
Potassium: 4.2 mmol/L (ref 3.5–5.1)
Potassium: 3.8 mmol/L (ref 3.5–5.1)
SODIUM: 137 mmol/L (ref 135–145)
Sodium: 133 mmol/L — ABNORMAL LOW (ref 135–145)

## 2018-02-24 LAB — CBC
HCT: 36.4 % — ABNORMAL LOW (ref 39.0–52.0)
HEMOGLOBIN: 11.8 g/dL — AB (ref 13.0–17.0)
MCH: 29.1 pg (ref 26.0–34.0)
MCHC: 32.4 g/dL (ref 30.0–36.0)
MCV: 89.7 fL (ref 80.0–100.0)
NRBC: 0 % (ref 0.0–0.2)
Platelets: 175 10*3/uL (ref 150–400)
RBC: 4.06 MIL/uL — ABNORMAL LOW (ref 4.22–5.81)
RDW: 14.1 % (ref 11.5–15.5)
WBC: 4.8 10*3/uL (ref 4.0–10.5)

## 2018-02-24 MED ORDER — GABAPENTIN 300 MG PO CAPS
300.0000 mg | ORAL_CAPSULE | Freq: Three times a day (TID) | ORAL | Status: DC
  Administered 2018-02-24 (×2): 300 mg via ORAL
  Filled 2018-02-24 (×2): qty 1

## 2018-02-24 MED ORDER — FAMOTIDINE IN NACL 20-0.9 MG/50ML-% IV SOLN
20.0000 mg | Freq: Two times a day (BID) | INTRAVENOUS | Status: DC
  Administered 2018-02-24 (×2): 20 mg via INTRAVENOUS
  Filled 2018-02-24 (×2): qty 50

## 2018-02-24 MED ORDER — INFLUENZA VAC SPLIT HIGH-DOSE 0.5 ML IM SUSY: 0.5000 mL | PREFILLED_SYRINGE | INTRAMUSCULAR | Status: DC

## 2018-02-24 MED ORDER — MOMETASONE FURO-FORMOTEROL FUM 200-5 MCG/ACT IN AERO
2.0000 | INHALATION_SPRAY | Freq: Two times a day (BID) | RESPIRATORY_TRACT | Status: DC
  Administered 2018-02-24: 2 via RESPIRATORY_TRACT
  Filled 2018-02-24: qty 8.8

## 2018-02-24 MED ORDER — LEVOTHYROXINE SODIUM 100 MCG PO TABS
100.0000 ug | ORAL_TABLET | Freq: Every day | ORAL | Status: DC
  Administered 2018-02-24: 100 ug via ORAL
  Filled 2018-02-24: qty 1

## 2018-02-24 MED ORDER — ROSUVASTATIN CALCIUM 20 MG PO TABS
20.0000 mg | ORAL_TABLET | Freq: Every day | ORAL | Status: DC
  Administered 2018-02-24: 20 mg via ORAL
  Filled 2018-02-24: qty 1

## 2018-02-24 MED ORDER — SUCRALFATE 1 GM/10ML PO SUSP
1.0000 g | Freq: Three times a day (TID) | ORAL | Status: DC
  Administered 2018-02-24 (×2): 1 g via ORAL
  Filled 2018-02-24 (×2): qty 10

## 2018-02-24 MED ORDER — EZETIMIBE 10 MG PO TABS
10.0000 mg | ORAL_TABLET | Freq: Every day | ORAL | Status: DC
  Administered 2018-02-24: 10 mg via ORAL
  Filled 2018-02-24: qty 1

## 2018-02-24 MED ORDER — SERTRALINE HCL 50 MG PO TABS
50.0000 mg | ORAL_TABLET | Freq: Every day | ORAL | Status: DC
  Administered 2018-02-24: 50 mg via ORAL
  Filled 2018-02-24: qty 1

## 2018-02-24 MED ORDER — SODIUM CHLORIDE 0.9 % IV SOLN
INTRAVENOUS | Status: DC
  Administered 2018-02-24: 10:00:00 via INTRAVENOUS

## 2018-02-24 MED ORDER — ONDANSETRON HCL 4 MG PO TABS: 4.0000 mg | ORAL_TABLET | Freq: Four times a day (QID) | ORAL | Status: DC | PRN

## 2018-02-24 MED ORDER — APIXABAN 5 MG PO TABS
5.0000 mg | ORAL_TABLET | Freq: Two times a day (BID) | ORAL | Status: DC
  Administered 2018-02-24 (×2): 5 mg via ORAL
  Filled 2018-02-24 (×2): qty 1

## 2018-02-24 MED ORDER — GLUCERNA SHAKE PO LIQD
237.0000 mL | Freq: Three times a day (TID) | ORAL | Status: DC
  Administered 2018-02-24 (×2): 237 mL via ORAL

## 2018-02-24 MED ORDER — ALBUTEROL SULFATE (2.5 MG/3ML) 0.083% IN NEBU: 3.0000 mL | INHALATION_SOLUTION | Freq: Four times a day (QID) | RESPIRATORY_TRACT | Status: DC | PRN

## 2018-02-24 MED ORDER — PANTOPRAZOLE SODIUM 40 MG PO TBEC: 40.0000 mg | DELAYED_RELEASE_TABLET | Freq: Two times a day (BID) | ORAL | Status: DC

## 2018-02-24 MED ORDER — DICYCLOMINE HCL 10 MG PO CAPS
20.0000 mg | ORAL_CAPSULE | Freq: Three times a day (TID) | ORAL | Status: DC
  Administered 2018-02-24 (×2): 20 mg via ORAL
  Filled 2018-02-24 (×2): qty 2

## 2018-02-24 MED ORDER — METFORMIN HCL ER 500 MG PO TB24
1000.0000 mg | ORAL_TABLET | Freq: Two times a day (BID) | ORAL | Status: DC
  Administered 2018-02-24 (×2): 1000 mg via ORAL
  Filled 2018-02-24 (×2): qty 2

## 2018-02-24 MED ORDER — SUCRALFATE 1 GM/10ML PO SUSP: 1.0000 g | Freq: Three times a day (TID) | ORAL | 1 refills | Status: AC

## 2018-02-24 MED ORDER — POTASSIUM CHLORIDE IN NACL 40-0.9 MEQ/L-% IV SOLN
INTRAVENOUS | Status: DC
  Administered 2018-02-24: 125 mL/h via INTRAVENOUS

## 2018-02-24 MED ORDER — PANTOPRAZOLE SODIUM 40 MG PO TBEC: 40.0000 mg | DELAYED_RELEASE_TABLET | Freq: Two times a day (BID) | ORAL | 0 refills | Status: DC

## 2018-02-24 MED ORDER — ENSURE ENLIVE PO LIQD
237.0000 mL | Freq: Two times a day (BID) | ORAL | Status: DC
  Administered 2018-02-24: 237 mL via ORAL

## 2018-02-24 MED ORDER — ACETAMINOPHEN 650 MG RE SUPP: 650.0000 mg | Freq: Four times a day (QID) | RECTAL | Status: DC | PRN

## 2018-02-24 MED ORDER — DICYCLOMINE HCL 20 MG PO TABS
20.0000 mg | ORAL_TABLET | Freq: Three times a day (TID) | ORAL | Status: DC
  Filled 2018-02-24 (×7): qty 1

## 2018-02-24 MED ORDER — PANTOPRAZOLE SODIUM 40 MG PO TBEC
40.0000 mg | DELAYED_RELEASE_TABLET | Freq: Every day | ORAL | Status: DC
  Administered 2018-02-24: 40 mg via ORAL
  Filled 2018-02-24: qty 1

## 2018-02-24 MED ORDER — ACETAMINOPHEN 325 MG PO TABS: 650.0000 mg | ORAL_TABLET | Freq: Four times a day (QID) | ORAL | Status: DC | PRN

## 2018-02-24 NOTE — Progress Notes (Signed)
Patient is to be discharged home and in stable condition. Patient's IV removed, WNL. Patient given discharge instructions and verbalized understanding. Patient to be escorted out by staff via wheelchair upon daughter's arrival.  Celestia Khat, RN

## 2018-02-24 NOTE — Discharge Summary (Signed)
Discharge Summary  Matthew Wilkins BMW:[id number] DOB: 1945/12/15  PCP: [name], MD  Admit date: 02/23/2018 Discharge date: 02/24/2018  Time spent: 35 minutes  Recommendations for Outpatient Follow-up:  1. New medication: Carafate 1 g p.o. 4 times a day 2. Medication change: Protonix increased to 40 mg twice a day for the next 2 weeks 3. Patient will resume recent new medication of Cipro and Flagyl until course complete 4. Patient will follow with PCP in the next month  Discharge Diagnoses:  Active Hospital Problems   Diagnosis Date Noted  . Acute renal injury ([place]) 02/23/2018  . Diverticulitis of intestine 02/23/2018  . GERD (gastroesophageal reflux disease) 02/23/2018  . Hypothyroidism 07/27/2017  . Adenocarcinoma of right lung, stage 3 ([place]) 11/17/2016  . History of pulmonary embolus (PE) 08/28/2016  . COPD (chronic obstructive pulmonary disease) ([place]) 07/06/2010  . DM type 2 (diabetes mellitus, type 2) ([place]) 08/26/2008  . Essential hypertension 08/26/2008    Resolved Hospital Problems  No resolved problems to display.    Discharge Condition: Improved, being discharged home  Diet recommendation: Carb modified, advised to avoid foods that can cause his acid reflux to worsen including spicy foods, chocolate, peppermint  Vitals:   02/24/18 1342 02/24/18 1343  BP: 131/71 131/71  Pulse: 88 83  Resp: 17 17  Temp: 98.2 F (36.8 C) 98.2 F (36.8 C)  SpO2: 100% 100%    History of present illness:  72 year old male with past mental history of diabetes mellitus, DVT and PE on anticoagulation, hypertension, GERD and adenocarcinoma of the right lung stage III on chemotherapy who was seen in the emergency room complaining of abdominal pain on 10/9 at that time was diagnosed with diverticulitis.  Patient was prescribed Cipro and Flagyl and sent home.  He is continued to have abdominal pain and return to the emergency room on 10/11 with complaints of additional  periumbilical and epigastric pain.  A CT scan of the abdomen and pelvis was unremarkable.  At time of admission, creatinine also noted to be elevated from baseline.  Hospital Course:  Principal Problem:   Acute renal injury ([place]) in the setting of stage II chronic kidney disease: GFR on admission at 14 with a creatinine of 1.6.  With IV fluids, GFR is improved to 50 and creatinine 1.35.  Patient continued to receive IV fluids and should be at baseline upon discharge Active Problems:   DM type 2 (diabetes mellitus, type 2) ([place]): Stable, continued on sliding scale.   Essential hypertension   COPD (chronic obstructive pulmonary disease) ([place]): Stable   History of pulmonary embolus (PE): Stable, continued on anticoagulation   Adenocarcinoma of right lung, stage 3 ([place]): Stable.  Last received radiation 7 months ago so do not think that this is radiation-induced esophagitis.  Continue chemotherapy as per oncology   Hypothyroidism: Stable continue Synthroid   Diverticulitis of intestine: Patient still complains of some lower quadrant abdominal pain although mild.  Will continue oral antibiotics upon discharge.  During hospitalization, he received several doses of IV instead. Esophagitis with suspected esophageal ulcer exacerbated by GERD (gastroesophageal reflux disease): With normal CT scan other than mild diverticulitis noted, patient has severe pain which she says is much worse after he eats or drinks.  However I was also observing that when he would swallow saliva, this would cause severe pain in a focal area in the midline of his chest.  I suspect that he has an esophageal ulcer and likely this is being made much  worse by severe acid reflux as he also complains of some gastric burning which she says radiates upwards.  Have increased his PPI from once a day to twice a day for the next 2 weeks.  I started him on Carafate which she received to respond well to so he is given a prescription for this.  I also  recommended that he avoid certain foods which can trigger worsening reflux.  Otherwise doing well.  No events on telemetry.  Enzymes negative.     Procedures:  None  Consultations:  None  Discharge Exam: BP 131/71   Pulse 83   Temp 98.2 F (36.8 C) (Oral)   Resp 17   Ht 5\' 9"  (1.753 m)   Wt 71.1 kg   SpO2 100%   BMI 23.15 kg/m   General: Alert and oriented x3, no acute distress Cardiovascular: Regular rate and rhythm, S1-S2 Respiratory: Clear to auscultation bilaterally  Discharge Instructions You were cared for by a hospitalist during your hospital stay. If you have any questions about your discharge medications or the care you received while you were in the hospital after you are discharged, you can call the unit and asked to speak with the hospitalist on call if the hospitalist that took care of you is not available. Once you are discharged, your primary care physician will handle any further medical issues. Please note that NO REFILLS for any discharge medications will be authorized once you are discharged, as it is imperative that you return to your primary care physician (or establish a relationship with a primary care physician if you do not have one) for your aftercare needs so that they can reassess your need for medications and monitor your lab values.  Discharge Instructions    Diet - low sodium heart healthy   Complete by:  As directed    Increase activity slowly   Complete by:  As directed      Allergies as of 02/24/2018      Reactions   Aspirin Other (See Comments)   Nose bleeds       Medication List    STOP taking these medications   omeprazole 40 MG capsule Commonly known as:  PRILOSEC Replaced by:  pantoprazole 40 MG tablet     TAKE these medications   ADVAIR DISKUS 250-50 MCG/DOSE Aepb Generic drug:  Fluticasone-Salmeterol Inhale 1 puff into the lungs every 12 (twelve) hours.   albuterol 108 (90 Base) MCG/ACT inhaler Commonly known as:   PROVENTIL HFA;VENTOLIN HFA Inhale 1-2 puffs into the lungs every 6 (six) hours as needed for wheezing or shortness of breath.   apixaban 5 MG Tabs tablet Commonly known as:  ELIQUIS Take 1 tablet (5 mg total) by mouth 2 (two) times daily.   ciprofloxacin 500 MG tablet Commonly known as:  CIPRO Take 1 tablet (500 mg total) by mouth 2 (two) times daily. One po bid x 7 days   dicyclomine 20 MG tablet Commonly known as:  BENTYL Take 1 tablet by mouth 3 (three) times daily.   feeding supplement (GLUCERNA SHAKE) Liqd Take 237 mLs by mouth 3 (three) times daily between meals.   gabapentin 300 MG capsule Commonly known as:  NEURONTIN Take 1 capsule by mouth 3 (three) times daily.   glipiZIDE 5 MG 24 hr tablet Commonly known as:  GLUCOTROL XL Take 10 mg by mouth 2 (two) times daily.   guaiFENesin 600 MG 12 hr tablet Commonly known as:  MUCINEX Take 1 tablet (600 mg  total) by mouth 2 (two) times daily.   guaiFENesin-dextromethorphan 100-10 MG/5ML syrup Commonly known as:  ROBITUSSIN DM Take 5 mLs by mouth every 4 (four) hours as needed for cough.   levothyroxine 100 MCG tablet Commonly known as:  SYNTHROID, LEVOTHROID TAKE 1 TABLET DAILY BEFORE BREAKFAST.   metFORMIN 500 MG 24 hr tablet Commonly known as:  GLUCOPHAGE-XR Take 2 tablets by mouth 2 (two) times daily.   metroNIDAZOLE 500 MG tablet Commonly known as:  FLAGYL Take 1 tablet (500 mg total) by mouth 2 (two) times daily. One po bid x 7 days   ondansetron 4 MG tablet Commonly known as:  ZOFRAN Take 1 tablet (4 mg total) by mouth every 6 (six) hours as needed.   pantoprazole 40 MG tablet Commonly known as:  PROTONIX Take 1 tablet (40 mg total) by mouth 2 (two) times daily. Replaces:  omeprazole 40 MG capsule   rosuvastatin 20 MG tablet Commonly known as:  CRESTOR Take 1 tablet (20 mg total) by mouth daily.   sertraline 25 MG tablet Commonly known as:  ZOLOFT Take 50 mg by mouth daily.   sucralfate 1 GM/10ML  suspension Commonly known as:  CARAFATE Take 10 mLs (1 g total) by mouth 4 (four) times daily -  with meals and at bedtime.   ZETIA 10 MG tablet Generic drug:  ezetimibe Take 10 mg by mouth daily.      Allergies  Allergen Reactions  . Aspirin Other (See Comments)    Nose bleeds       The results of significant diagnostics from this hospitalization (including imaging, microbiology, ancillary and laboratory) are listed below for reference.    Significant Diagnostic Studies: Dg Chest 2 View  Result Date: 02/21/2018 CLINICAL DATA:  Chest pain EXAM: CHEST - 2 VIEW COMPARISON:  11/19/2017 chest x-ray, 02/06/2018 chest CT FINDINGS: Cardiac shadows within normal limits. The lungs are well aerated bilaterally. Slight improvement in the degree of right middle and lower lobe density related to prior radiation therapy. No acute infiltrate or sizable effusion is noted. No acute bony abnormality is seen. IMPRESSION: Post radiation changes in the right middle and lower lobes although improved when compared with the prior chest x-ray. No acute abnormality noted. Electronically Signed   By: Inez Catalina M.D.   On: 02/21/2018 15:55   Ct Chest W Contrast  Result Date: 02/07/2018 CLINICAL DATA:  Non-small cell lung cancer, chemo radiation. EXAM: CT CHEST WITH CONTRAST TECHNIQUE: Multidetector CT imaging of the chest was performed during intravenous contrast administration. CONTRAST:  110mL OMNIPAQUE IOHEXOL 300 MG/ML  SOLN COMPARISON:  11/15/2017 and CT abdomen pelvis 07/13/2017. FINDINGS: Cardiovascular: Atherosclerotic calcification of the arterial vasculature, including coronary arteries. Heart size normal. No pericardial effusion. Mediastinum/Nodes: No pathologically enlarged mediastinal, left hilar or axillary lymph nodes. Esophagus is grossly unremarkable. Lungs/Pleura: Centrilobular and paraseptal emphysema. Fluent soft tissue, patchy ground-glass, bronchiectasis and architectural distortion are seen in  the medial right hemithorax, related to radiation therapy, stable. Scarring extends into the anterior right lower lobe, stable. No pleural fluid. Airway is otherwise unremarkable. Upper Abdomen: Visualized portions of the liver, gallbladder and adrenal glands are unremarkable. Partially imaged 2.0 cm stone is seen in the right renal pelvis with calyceal dilatation or mild hydronephrosis. 11 mm low-attenuation lesion in the pole right kidney is too small to characterize but a cyst is most likely. Visualized portions of the left kidney, spleen, pancreas, stomach and bowel are grossly unremarkable. No upper abdominal adenopathy. Musculoskeletal: Degenerative changes in the  spine. No worrisome lytic or sclerotic lesions. IMPRESSION: 1. Post treatment scarring in the medial right hemithorax without evidence of recurrent or metastatic disease. 2. Aortic atherosclerosis (ICD10-170.0). Coronary artery calcification. 3.  Emphysema (ICD10-J43.9). 4. Partially imaged right renal pelvis stone with caliceal dilatation or mild hydronephrosis, stable from 07/13/2017. Electronically Signed   By: Lorin Picket M.D.   On: 02/07/2018 09:39   Ct Abdomen Pelvis W Contrast  Result Date: 02/21/2018 CLINICAL DATA:  Generalized lower abdomen pain. History of lung cancer in 2018. EXAM: CT ABDOMEN AND PELVIS WITH CONTRAST TECHNIQUE: Multidetector CT imaging of the abdomen and pelvis was performed using the standard protocol following bolus administration of intravenous contrast. CONTRAST:  147mL ISOVUE-300 IOPAMIDOL (ISOVUE-300) INJECTION 61% COMPARISON:  Oct 02, 2009, chest CT August 28, 2016 FINDINGS: Lower chest: Patchy opacity is identified in the superior right lower lobe on image 1, incompletely included. The heart size is normal. There is no pleural effusion. Hepatobiliary: No focal liver abnormality is seen. No gallstones, gallbladder wall thickening, or biliary dilatation. Pancreas: Unremarkable. No pancreatic ductal dilatation  or surrounding inflammatory changes. Spleen: Normal in size without focal abnormality. Adrenals/Urinary Tract: Adrenal glands are unremarkable. Kidneys are normal, without renal calculi or hydronephrosis. Right kidney cysts is identified. Bladder is unremarkable. Stomach/Bowel: There is minimal stranding surrounding the mid descending colon on image 37 series 2. There is diverticulosis of colon. The small bowel is normal. The appendix is normal. There is a small hiatal hernia. Fluid-filled stomach is identified. Vascular/Lymphatic: Aortic atherosclerosis. No enlarged abdominal or pelvic lymph nodes. Reproductive: Prostate calcifications are noted. Other: Small bilateral inguinal herniation of mesenteric fat are noted. Musculoskeletal: Degenerative joint changes of the spine are identified. IMPRESSION: Minimal stranding surrounding the mid descending colon. This can be seen in and early developing diverticulitis. Patchy opacity is identified in the superior right lower lobe on image 1, incompletely included. This is nonspecific. Electronically Signed   By: Abelardo Diesel M.D.   On: 02/21/2018 16:36    Microbiology: No results found for this or any previous visit (from the past 240 hour(s)).   Labs: Basic Metabolic Panel: Recent Labs  Lab 02/21/18 1455 02/23/18 2014 02/24/18 0630  NA 138 133* 137  K 4.2 3.9 4.2  CL 105 104 107  CO2 21* 21* 22  GLUCOSE 274* 225* 230*  BUN 14 22 16   CREATININE 1.14 1.61* 1.35*  CALCIUM 9.5 9.2 8.6*   Liver Function Tests: Recent Labs  Lab 02/21/18 1455 02/23/18 2014  AST 26 22  ALT 13 13  ALKPHOS 67 56  BILITOT 0.8 0.6  PROT 8.1 7.6  ALBUMIN 4.7 4.5   Recent Labs  Lab 02/21/18 1455 02/23/18 2014  LIPASE 24 23   No results for input(s): AMMONIA in the last 168 hours. CBC: Recent Labs  Lab 02/21/18 1455 02/23/18 2014 02/24/18 0630  WBC 11.5* 5.1 4.8  NEUTROABS  --  3.6  --   HGB 13.9 12.9* 11.8*  HCT 42.9 39.4 36.4*  MCV 86.3 86.0 89.7    PLT 260 205 175   Cardiac Enzymes: Recent Labs  Lab 02/21/18 1455 02/21/18 2123  TROPONINI <0.03 <0.03   BNP: BNP (last 3 results) No results for input(s): BNP in the last 8760 hours.  ProBNP (last 3 results) No results for input(s): PROBNP in the last 8760 hours.  CBG: No results for input(s): GLUCAP in the last 168 hours.     Signed:  Annita Brod, MD [facility] 02/24/2018, 3:17 PM

## 2018-02-27 ENCOUNTER — Telehealth: Payer: Self-pay | Admitting: Medical Oncology

## 2018-02-27 NOTE — Telephone Encounter (Signed)
Pt not sure he is coming back because " you did not call me back on Monday like you said you would. I will see what my daughter says".

## 2018-03-07 DIAGNOSIS — E1142 Type 2 diabetes mellitus with diabetic polyneuropathy: Secondary | ICD-10-CM | POA: Diagnosis not present

## 2018-03-07 DIAGNOSIS — E039 Hypothyroidism, unspecified: Secondary | ICD-10-CM | POA: Diagnosis not present

## 2018-03-07 DIAGNOSIS — K219 Gastro-esophageal reflux disease without esophagitis: Secondary | ICD-10-CM | POA: Diagnosis not present

## 2018-03-07 DIAGNOSIS — E782 Mixed hyperlipidemia: Secondary | ICD-10-CM | POA: Diagnosis not present

## 2018-03-07 DIAGNOSIS — Z86711 Personal history of pulmonary embolism: Secondary | ICD-10-CM | POA: Diagnosis not present

## 2018-03-07 DIAGNOSIS — N179 Acute kidney failure, unspecified: Secondary | ICD-10-CM | POA: Diagnosis not present

## 2018-03-07 DIAGNOSIS — I10 Essential (primary) hypertension: Secondary | ICD-10-CM | POA: Diagnosis not present

## 2018-03-07 DIAGNOSIS — Z23 Encounter for immunization: Secondary | ICD-10-CM | POA: Diagnosis not present

## 2018-03-07 DIAGNOSIS — E1165 Type 2 diabetes mellitus with hyperglycemia: Secondary | ICD-10-CM | POA: Diagnosis not present

## 2018-03-07 DIAGNOSIS — R69 Illness, unspecified: Secondary | ICD-10-CM | POA: Diagnosis not present

## 2018-03-07 DIAGNOSIS — G2581 Restless legs syndrome: Secondary | ICD-10-CM | POA: Diagnosis not present

## 2018-03-08 ENCOUNTER — Inpatient Hospital Stay: Payer: Medicare HMO

## 2018-03-08 ENCOUNTER — Encounter: Payer: Self-pay | Admitting: Oncology

## 2018-03-08 ENCOUNTER — Telehealth: Payer: Self-pay | Admitting: Oncology

## 2018-03-08 ENCOUNTER — Ambulatory Visit: Payer: Medicare HMO

## 2018-03-08 ENCOUNTER — Inpatient Hospital Stay (HOSPITAL_BASED_OUTPATIENT_CLINIC_OR_DEPARTMENT_OTHER): Payer: Medicare HMO | Admitting: Oncology

## 2018-03-08 VITALS — BP 134/81 | HR 92 | Temp 97.7°F | Resp 16 | Ht 69.0 in | Wt 163.7 lb

## 2018-03-08 DIAGNOSIS — Z5112 Encounter for antineoplastic immunotherapy: Secondary | ICD-10-CM

## 2018-03-08 DIAGNOSIS — C3491 Malignant neoplasm of unspecified part of right bronchus or lung: Secondary | ICD-10-CM

## 2018-03-08 DIAGNOSIS — C342 Malignant neoplasm of middle lobe, bronchus or lung: Secondary | ICD-10-CM | POA: Diagnosis not present

## 2018-03-08 DIAGNOSIS — Z79899 Other long term (current) drug therapy: Secondary | ICD-10-CM | POA: Diagnosis not present

## 2018-03-08 LAB — CMP (CANCER CENTER ONLY)
ALBUMIN: 3.8 g/dL (ref 3.5–5.0)
ALK PHOS: 65 U/L (ref 38–126)
ALT: 9 U/L (ref 0–44)
AST: 17 U/L (ref 15–41)
Anion gap: 11 (ref 5–15)
BILIRUBIN TOTAL: 0.4 mg/dL (ref 0.3–1.2)
BUN: 16 mg/dL (ref 8–23)
CALCIUM: 9.2 mg/dL (ref 8.9–10.3)
CO2: 20 mmol/L — AB (ref 22–32)
Chloride: 106 mmol/L (ref 98–111)
Creatinine: 1.19 mg/dL (ref 0.61–1.24)
GFR, Est AFR Am: >60 mL/min (ref >60)
GFR, Estimated: 59 mL/min — ABNORMAL LOW (ref >60)
GLUCOSE: 167 mg/dL — AB (ref 70–99)
Potassium: 4 mmol/L (ref 3.5–5.1)
SODIUM: 137 mmol/L (ref 135–145)
TOTAL PROTEIN: 7.2 g/dL (ref 6.5–8.1)

## 2018-03-08 LAB — CBC WITH DIFFERENTIAL (CANCER CENTER ONLY)
ABS IMMATURE GRANULOCYTES: 0.02 10*3/uL (ref 0.00–0.07)
BASOS PCT: 1 %
Basophils Absolute: 0.1 10*3/uL (ref 0.0–0.1)
EOS ABS: 0.1 10*3/uL (ref 0.0–0.5)
Eosinophils Relative: 2 %
HEMATOCRIT: 35.7 % — AB (ref 39.0–52.0)
Hemoglobin: 11.9 g/dL — ABNORMAL LOW (ref 13.0–17.0)
IMMATURE GRANULOCYTES: 0 %
LYMPHS ABS: 0.4 10*3/uL — AB (ref 0.7–4.0)
Lymphocytes Relative: 6 %
MCH: 28.7 pg (ref 26.0–34.0)
MCHC: 33.3 g/dL (ref 30.0–36.0)
MCV: 86.2 fL (ref 80.0–100.0)
MONO ABS: 0.4 10*3/uL (ref 0.1–1.0)
MONOS PCT: 6 %
NEUTROS ABS: 5 10*3/uL (ref 1.7–7.7)
Neutrophils Relative %: 85 %
PLATELETS: 190 10*3/uL (ref 150–400)
RBC: 4.14 MIL/uL — ABNORMAL LOW (ref 4.22–5.81)
RDW: 14.3 % (ref 11.5–15.5)
WBC Count: 5.9 10*3/uL (ref 4.0–10.5)
nRBC: 0 % (ref 0.0–0.2)

## 2018-03-08 LAB — TSH: TSH: 39.139 u[IU]/mL — ABNORMAL HIGH (ref 0.320–4.118)

## 2018-03-08 MED ORDER — SODIUM CHLORIDE 0.9 % IV SOLN
Freq: Once | INTRAVENOUS | Status: AC
  Administered 2018-03-08: 13:00:00 via INTRAVENOUS
  Filled 2018-03-08: qty 250

## 2018-03-08 MED ORDER — SODIUM CHLORIDE 0.9 % IV SOLN
10.5000 mg/kg | Freq: Once | INTRAVENOUS | Status: AC
  Administered 2018-03-08: 740 mg via INTRAVENOUS
  Filled 2018-03-08: qty 4.8

## 2018-03-08 NOTE — Telephone Encounter (Signed)
All appts already scheduled per 10/24 los and per treatment plan -  No additional appts to schedule.

## 2018-03-08 NOTE — Progress Notes (Signed)
[facility] OFFICE PROGRESS NOTE  [name], MD [address] [place] [address]  DIAGNOSIS:Stage IIIA (T1a, N2, M0) non-small cell lung cancer, squamous cell carcinoma presented with right middle lobe pulmonary nodule in addition to ipsilateral hilar and ipsilateral mediastinal lymphadenopathy diagnosed in June 2018.  PRIOR THERAPY: A course of concurrent chemoradiation with weekly carboplatin for AUC of 2 and paclitaxel 45 MG/M2. Status post 7 cycles.  CURRENT THERAPY: Consolidation immunotherapy with Imfinzi (Durvalumab) 10 MG/KG every 2 weeks.first dose 02/22/2017. Status post 19cycles.  INTERVAL HISTORY: Matthew Wilkins 72 y.o. male returns for a routine follow-up visit accompanied by his daughter.  The patient is feeling fine today and has no specific complaints except for mild fatigue.  He was recently hospitalized for dehydration and diverticulitis.  He was discharged home on metronidazole and Cipro.  He has completed all of his antibiotics.  He denies fevers and chills.  Denies chest pain and hemoptysis.  He has a baseline cough and shortness of breath.  Denies nausea, vomiting, constipation, diarrhea.  Denies recent weight loss or night sweats.  He reports that he has a spot on his right arm that is likely a skin cancer.  He is due to have this removed early next week.  The patient is here for evaluation prior to cycle #20 of Imfinzi.  MEDICAL HISTORY: Past Medical History:  Diagnosis Date  . Adenocarcinoma of right lung, stage 3 ([place]) 11/17/2016  . Anxiety   . Arthritis   . Coronary artery disease   . Diabetes mellitus   . DVT (deep venous thrombosis) ([place])   . Encounter for antineoplastic chemotherapy 11/17/2016  . History of radiation therapy 11/28/16-01/06/17   right lung was treated to 60 Gy in 30 fractions  . Hypercholesterolemia   . Hypertension   . PAD (peripheral artery disease) ([place])   . SOB (shortness of breath)     ALLERGIES:  is  allergic to aspirin.  MEDICATIONS:  Current Outpatient Medications  Medication Sig Dispense Refill  . albuterol (PROVENTIL HFA;VENTOLIN HFA) 108 (90 Base) MCG/ACT inhaler Inhale 1-2 puffs into the lungs every 6 (six) hours as needed for wheezing or shortness of breath.     [name] apixaban (ELIQUIS) 5 MG TABS tablet Take 1 tablet (5 mg total) by mouth 2 (two) times daily. 60 tablet 0  . ciprofloxacin (CIPRO) 500 MG tablet Take 1 tablet (500 mg total) by mouth 2 (two) times daily. One po bid x 7 days 14 tablet 0  . dicyclomine (BENTYL) 20 MG tablet Take 1 tablet by mouth 3 (three) times daily.    . feeding supplement, GLUCERNA SHAKE, (GLUCERNA SHAKE) LIQD Take 237 mLs by mouth 3 (three) times daily between meals. 90 Can 0  . Fluticasone-Salmeterol (ADVAIR DISKUS) 250-50 MCG/DOSE AEPB Inhale 1 puff into the lungs every 12 (twelve) hours.     . gabapentin (NEURONTIN) 300 MG capsule Take 1 capsule by mouth 3 (three) times daily.    [name] glipiZIDE (GLUCOTROL XL) 5 MG 24 hr tablet Take 10 mg by mouth 2 (two) times daily.     [name] guaiFENesin (MUCINEX) 600 MG 12 hr tablet Take 1 tablet (600 mg total) by mouth 2 (two) times daily. 10 tablet 0  . guaiFENesin-dextromethorphan (ROBITUSSIN DM) 100-10 MG/5ML syrup Take 5 mLs by mouth every 4 (four) hours as needed for cough. 118 mL 0  . levothyroxine (SYNTHROID, LEVOTHROID) 100 MCG tablet TAKE 1 TABLET DAILY BEFORE BREAKFAST. 30 tablet 0  .  metFORMIN (GLUCOPHAGE-XR) 500 MG 24 hr tablet Take 2 tablets by mouth 2 (two) times daily.    . metroNIDAZOLE (FLAGYL) 500 MG tablet Take 1 tablet (500 mg total) by mouth 2 (two) times daily. One po bid x 7 days 14 tablet 0  . ondansetron (ZOFRAN) 4 MG tablet Take 1 tablet (4 mg total) by mouth every 6 (six) hours as needed. 12 tablet 0  . pantoprazole (PROTONIX) 40 MG tablet Take 1 tablet (40 mg total) by mouth 2 (two) times daily. 30 tablet 0  . rosuvastatin (CRESTOR) 20 MG tablet Take 1 tablet (20 mg total) by mouth daily. 30  tablet 11  . sertraline (ZOLOFT) 25 MG tablet Take 50 mg by mouth daily.     . sucralfate (CARAFATE) 1 GM/10ML suspension Take 10 mLs (1 g total) by mouth 4 (four) times daily -  with meals and at bedtime. 420 mL 1  . ZETIA 10 MG tablet Take 10 mg by mouth daily.     No current facility-administered medications for this visit.    Facility-Administered Medications Ordered in Other Visits  Medication Dose Route Frequency Provider Last Rate Last Dose  . 0.9 %  sodium chloride infusion   Intravenous Once Curt Bears, MD      . durvalumab (IMFINZI) 700 mg in sodium chloride 0.9 % 100 mL chemo infusion  10 mg/kg (Treatment Plan Recorded) Intravenous Once Curt Bears, MD        SURGICAL HISTORY:  Past Surgical History:  Procedure Laterality Date  . CARDIAC CATHETERIZATION N/A 06/10/2015   Procedure: Left Heart Cath and Coronary Angiography;  Surgeon: Burnell Blanks, MD;  Location: [facility];  Service: Cardiovascular;  Laterality: N/A;  . LOWER EXTREMITY ANGIOGRAM N/A 07/13/2011   Procedure: LOWER EXTREMITY ANGIOGRAM;  Surgeon: Burnell Blanks, MD;  Location: [facility] CATH LAB;  Service: Cardiovascular;  Laterality: N/A;  . OTHER SURGICAL HISTORY    . OTHER SURGICAL HISTORY    . OTHER SURGICAL HISTORY    . PERCUTANEOUS STENT INTERVENTION Right 07/13/2011   Procedure: PERCUTANEOUS STENT INTERVENTION;  Surgeon: Burnell Blanks, MD;  Location: [facility] CATH LAB;  Service: Cardiovascular;  Laterality: Right;    REVIEW OF SYSTEMS:   Review of Systems  Constitutional: Negative for appetite change, chills, fever and unexpected weight change.  Positive for fatigue. HENT:   Negative for mouth sores, nosebleeds, sore throat and trouble swallowing.   Eyes: Negative for eye problems and icterus.  Respiratory: Negative for hemoptysis and wheezing.  Positive for his baseline cough and shortness of breath. Cardiovascular: Negative for chest pain and leg swelling.   Gastrointestinal: Negative for abdominal pain, constipation, diarrhea, nausea and vomiting.  Genitourinary: Negative for bladder incontinence, difficulty urinating, dysuria, frequency and hematuria.   Musculoskeletal: Negative for back pain, gait problem, neck pain and neck stiffness.  Skin: Positive for lesion to his right forearm.  He is due to have this removed early next week. Neurological: Negative for dizziness, extremity weakness, gait problem, headaches, light-headedness and seizures.  Hematological: Negative for adenopathy. Does not bruise/bleed easily.  Psychiatric/Behavioral: Negative for confusion, depression and sleep disturbance. The patient is not nervous/anxious.     PHYSICAL EXAMINATION:  Blood pressure 134/81, pulse 92, temperature 97.7 F (36.5 C), temperature source Oral, resp. rate 16, height 5\' 9"  (1.753 m), weight 163 lb 11.2 oz (74.3 kg), SpO2 100 %.  ECOG PERFORMANCE STATUS: 1 - Symptomatic but completely ambulatory  Physical Exam  Constitutional: Oriented to person, place, and  time and well-developed, well-nourished, and in no distress. No distress.  HENT:  Head: Normocephalic and atraumatic.  Mouth/Throat: Oropharynx is clear and moist. No oropharyngeal exudate.  Eyes: Conjunctivae are normal. Right eye exhibits no discharge. Left eye exhibits no discharge. No scleral icterus.  Neck: Normal range of motion. Neck supple.  Cardiovascular: Normal rate, regular rhythm, normal heart sounds and intact distal pulses.   Pulmonary/Chest: Effort normal and breath sounds normal. No respiratory distress. No wheezes. No rales.  Abdominal: Soft. Bowel sounds are normal. Exhibits no distension and no mass. There is no tenderness.  Musculoskeletal: Normal range of motion. Exhibits no edema.  Lymphadenopathy:    No cervical adenopathy.  Neurological: Alert and oriented to person, place, and time. Exhibits normal muscle tone. Gait normal. Coordination normal.  Skin: Reddened,  raised area to his right forearm measuring approximately 1 cm in diameter.  No tenderness or drainage. Psychiatric: Mood, memory and judgment normal.  Vitals reviewed.  LABORATORY DATA: Lab Results  Component Value Date   WBC 5.9 03/08/2018   HGB 11.9 (L) 03/08/2018   HCT 35.7 (L) 03/08/2018   MCV 86.2 03/08/2018   PLT 190 03/08/2018      Chemistry      Component Value Date/Time   NA 137 03/08/2018 1132   NA 136 05/04/2017 1036   K 4.0 03/08/2018 1132   K 4.0 05/04/2017 1036   CL 106 03/08/2018 1132   CO2 20 (L) 03/08/2018 1132   CO2 16 (L) 05/04/2017 1036   BUN 16 03/08/2018 1132   BUN 15.5 05/04/2017 1036   CREATININE 1.19 03/08/2018 1132   CREATININE 1.1 05/04/2017 1036      Component Value Date/Time   CALCIUM 9.2 03/08/2018 1132   CALCIUM 9.6 05/04/2017 1036   ALKPHOS 65 03/08/2018 1132   ALKPHOS 93 05/04/2017 1036   AST 17 03/08/2018 1132   AST 13 05/04/2017 1036   ALT 9 03/08/2018 1132   ALT 7 05/04/2017 1036   BILITOT 0.4 03/08/2018 1132   BILITOT 0.56 05/04/2017 1036       RADIOGRAPHIC STUDIES:  Dg Chest 2 View  Result Date: 02/21/2018 CLINICAL DATA:  Chest pain EXAM: CHEST - 2 VIEW COMPARISON:  11/19/2017 chest x-ray, 02/06/2018 chest CT FINDINGS: Cardiac shadows within normal limits. The lungs are well aerated bilaterally. Slight improvement in the degree of right middle and lower lobe density related to prior radiation therapy. No acute infiltrate or sizable effusion is noted. No acute bony abnormality is seen. IMPRESSION: Post radiation changes in the right middle and lower lobes although improved when compared with the prior chest x-ray. No acute abnormality noted. Electronically Signed   By: Inez Catalina M.D.   On: 02/21/2018 15:55   Ct Chest W Contrast  Result Date: 02/07/2018 CLINICAL DATA:  Non-small cell lung cancer, chemo radiation. EXAM: CT CHEST WITH CONTRAST TECHNIQUE: Multidetector CT imaging of the chest was performed during intravenous  contrast administration. CONTRAST:  11mL OMNIPAQUE IOHEXOL 300 MG/ML  SOLN COMPARISON:  11/15/2017 and CT abdomen pelvis 07/13/2017. FINDINGS: Cardiovascular: Atherosclerotic calcification of the arterial vasculature, including coronary arteries. Heart size normal. No pericardial effusion. Mediastinum/Nodes: No pathologically enlarged mediastinal, left hilar or axillary lymph nodes. Esophagus is grossly unremarkable. Lungs/Pleura: Centrilobular and paraseptal emphysema. Fluent soft tissue, patchy ground-glass, bronchiectasis and architectural distortion are seen in the medial right hemithorax, related to radiation therapy, stable. Scarring extends into the anterior right lower lobe, stable. No pleural fluid. Airway is otherwise unremarkable. Upper Abdomen: Visualized portions of  the liver, gallbladder and adrenal glands are unremarkable. Partially imaged 2.0 cm stone is seen in the right renal pelvis with calyceal dilatation or mild hydronephrosis. 11 mm low-attenuation lesion in the pole right kidney is too small to characterize but a cyst is most likely. Visualized portions of the left kidney, spleen, pancreas, stomach and bowel are grossly unremarkable. No upper abdominal adenopathy. Musculoskeletal: Degenerative changes in the spine. No worrisome lytic or sclerotic lesions. IMPRESSION: 1. Post treatment scarring in the medial right hemithorax without evidence of recurrent or metastatic disease. 2. Aortic atherosclerosis (ICD10-170.0). Coronary artery calcification. 3.  Emphysema (ICD10-J43.9). 4. Partially imaged right renal pelvis stone with caliceal dilatation or mild hydronephrosis, stable from 07/13/2017. Electronically Signed   By: Lorin Picket M.D.   On: 02/07/2018 09:39   Ct Abdomen Pelvis W Contrast  Result Date: 02/21/2018 CLINICAL DATA:  Generalized lower abdomen pain. History of lung cancer in 2018. EXAM: CT ABDOMEN AND PELVIS WITH CONTRAST TECHNIQUE: Multidetector CT imaging of the abdomen and  pelvis was performed using the standard protocol following bolus administration of intravenous contrast. CONTRAST:  110mL ISOVUE-300 IOPAMIDOL (ISOVUE-300) INJECTION 61% COMPARISON:  Oct 02, 2009, chest CT August 28, 2016 FINDINGS: Lower chest: Patchy opacity is identified in the superior right lower lobe on image 1, incompletely included. The heart size is normal. There is no pleural effusion. Hepatobiliary: No focal liver abnormality is seen. No gallstones, gallbladder wall thickening, or biliary dilatation. Pancreas: Unremarkable. No pancreatic ductal dilatation or surrounding inflammatory changes. Spleen: Normal in size without focal abnormality. Adrenals/Urinary Tract: Adrenal glands are unremarkable. Kidneys are normal, without renal calculi or hydronephrosis. Right kidney cysts is identified. Bladder is unremarkable. Stomach/Bowel: There is minimal stranding surrounding the mid descending colon on image 37 series 2. There is diverticulosis of colon. The small bowel is normal. The appendix is normal. There is a small hiatal hernia. Fluid-filled stomach is identified. Vascular/Lymphatic: Aortic atherosclerosis. No enlarged abdominal or pelvic lymph nodes. Reproductive: Prostate calcifications are noted. Other: Small bilateral inguinal herniation of mesenteric fat are noted. Musculoskeletal: Degenerative joint changes of the spine are identified. IMPRESSION: Minimal stranding surrounding the mid descending colon. This can be seen in and early developing diverticulitis. Patchy opacity is identified in the superior right lower lobe on image 1, incompletely included. This is nonspecific. Electronically Signed   By: Abelardo Diesel M.D.   On: 02/21/2018 16:36     ASSESSMENT/PLAN:  Adenocarcinoma of right lung, stage 3 ([place]) This is a very pleasant 72 year old white male with a stage IIIa non-small cell lung cancer, squamous cell carcinoma  The patient underwent a course of concurrent chemoradiation with weekly  carboplatin and paclitaxel status post 7 cycles. He had partial response after this treatment.  The patient is currently undergoing consolidation immunotherapy was Imfinzi (Durvalumab) status post 19cycles. He continues to tolerate his treatment well with no concerning adverse effects. Recommend for him to proceed with cycle #20 of his treatment today as scheduled.  He will follow-up in 2 weeks for evaluation prior to cycle #21.  For his right forearm lesion, he will undergo a biopsy by his primary care provider early next week.  The patient continues to smoke. He was strongly advised to stop smoking.  He was advised to call immediately if he has any concerning symptoms in the interval. The patient voices understanding of current disease status and treatment options and is in agreement with the current care plan. All questions were answered. The patient knows to call the  clinic with any problems, questions or concerns. We can certainly see the patient much sooner if necessary.   No orders of the defined types were placed in this encounter.    Mikey Bussing, DNP, AGPCNP-BC, AOCNP 03/08/18

## 2018-03-08 NOTE — Assessment & Plan Note (Addendum)
This is a very pleasant 72 year old white male with a stage IIIa non-small cell lung cancer, squamous cell carcinoma  The patient underwent a course of concurrent chemoradiation with weekly carboplatin and paclitaxel status post 7 cycles. He had partial response after this treatment.  The patient is currently undergoing consolidation immunotherapy was Imfinzi (Durvalumab) status post 19cycles. He continues to tolerate his treatment well with no concerning adverse effects. Recommend for him to proceed with cycle #20 of his treatment today as scheduled.  He will follow-up in 2 weeks for evaluation prior to cycle #21.  For his right forearm lesion, he will undergo a biopsy by his primary care provider early next week.  The patient continues to smoke. He was strongly advised to stop smoking.  He was advised to call immediately if he has any concerning symptoms in the interval. The patient voices understanding of current disease status and treatment options and is in agreement with the current care plan. All questions were answered. The patient knows to call the clinic with any problems, questions or concerns. We can certainly see the patient much sooner if necessary.

## 2018-03-08 NOTE — Patient Instructions (Signed)
[facility] Discharge Instructions for Patients Receiving Chemotherapy  Today you received the following chemotherapy agents: Durvalumab (Imfinzi)   To help prevent nausea and vomiting after your treatment, we encourage you to take your nausea medication as directed.   If you develop nausea and vomiting that is not controlled by your nausea medication, call the clinic.   BELOW ARE SYMPTOMS THAT SHOULD BE REPORTED IMMEDIATELY:  *FEVER GREATER THAN 100.5 F  *CHILLS WITH OR WITHOUT FEVER  NAUSEA AND VOMITING THAT IS NOT CONTROLLED WITH YOUR NAUSEA MEDICATION  *UNUSUAL SHORTNESS OF BREATH  *UNUSUAL BRUISING OR BLEEDING  TENDERNESS IN MOUTH AND THROAT WITH OR WITHOUT PRESENCE OF ULCERS  *URINARY PROBLEMS  *BOWEL PROBLEMS  UNUSUAL RASH Items with * indicate a potential emergency and should be followed up as soon as possible.  Feel free to call the clinic should you have any questions or concerns. The clinic phone number is [phone].  Please show the CHEMO ALERT CARD at check-in to the Emergency Department and triage nurse.   

## 2018-03-13 DIAGNOSIS — C44622 Squamous cell carcinoma of skin of right upper limb, including shoulder: Secondary | ICD-10-CM | POA: Diagnosis not present

## 2018-03-13 DIAGNOSIS — C44602 Unspecified malignant neoplasm of skin of right upper limb, including shoulder: Secondary | ICD-10-CM | POA: Diagnosis not present

## 2018-03-16 DIAGNOSIS — Z4800 Encounter for change or removal of nonsurgical wound dressing: Secondary | ICD-10-CM | POA: Diagnosis not present

## 2018-03-21 DIAGNOSIS — T8141XA Infection following a procedure, superficial incisional surgical site, initial encounter: Secondary | ICD-10-CM | POA: Diagnosis not present

## 2018-03-21 NOTE — Telephone Encounter (Signed)
Error opening  

## 2018-03-22 ENCOUNTER — Telehealth: Payer: Self-pay | Admitting: Internal Medicine

## 2018-03-22 ENCOUNTER — Inpatient Hospital Stay (HOSPITAL_BASED_OUTPATIENT_CLINIC_OR_DEPARTMENT_OTHER): Payer: Medicare HMO | Admitting: Internal Medicine

## 2018-03-22 ENCOUNTER — Inpatient Hospital Stay: Payer: Medicare HMO

## 2018-03-22 ENCOUNTER — Encounter: Payer: Self-pay | Admitting: General Practice

## 2018-03-22 ENCOUNTER — Inpatient Hospital Stay: Payer: Medicare HMO | Attending: Internal Medicine

## 2018-03-22 ENCOUNTER — Encounter: Payer: Self-pay | Admitting: Internal Medicine

## 2018-03-22 VITALS — BP 112/78 | HR 77 | Temp 97.7°F | Resp 12 | Ht 69.0 in | Wt 161.8 lb

## 2018-03-22 DIAGNOSIS — C342 Malignant neoplasm of middle lobe, bronchus or lung: Secondary | ICD-10-CM | POA: Insufficient documentation

## 2018-03-22 DIAGNOSIS — J189 Pneumonia, unspecified organism: Secondary | ICD-10-CM | POA: Diagnosis not present

## 2018-03-22 DIAGNOSIS — C3491 Malignant neoplasm of unspecified part of right bronchus or lung: Secondary | ICD-10-CM

## 2018-03-22 DIAGNOSIS — Z5112 Encounter for antineoplastic immunotherapy: Secondary | ICD-10-CM | POA: Insufficient documentation

## 2018-03-22 DIAGNOSIS — Z79899 Other long term (current) drug therapy: Secondary | ICD-10-CM | POA: Diagnosis not present

## 2018-03-22 DIAGNOSIS — I10 Essential (primary) hypertension: Secondary | ICD-10-CM

## 2018-03-22 DIAGNOSIS — F17200 Nicotine dependence, unspecified, uncomplicated: Secondary | ICD-10-CM

## 2018-03-22 LAB — CBC WITH DIFFERENTIAL (CANCER CENTER ONLY)
ABS IMMATURE GRANULOCYTES: 0.01 10*3/uL (ref 0.00–0.07)
Basophils Absolute: 0.1 10*3/uL (ref 0.0–0.1)
Basophils Relative: 2 %
EOS ABS: 0.2 10*3/uL (ref 0.0–0.5)
Eosinophils Relative: 3 %
HEMATOCRIT: 41.4 % (ref 39.0–52.0)
HEMOGLOBIN: 13.5 g/dL (ref 13.0–17.0)
IMMATURE GRANULOCYTES: 0 %
LYMPHS ABS: 0.9 10*3/uL (ref 0.7–4.0)
LYMPHS PCT: 19 %
MCH: 27.9 pg (ref 26.0–34.0)
MCHC: 32.6 g/dL (ref 30.0–36.0)
MCV: 85.5 fL (ref 80.0–100.0)
MONOS PCT: 8 %
Monocytes Absolute: 0.4 10*3/uL (ref 0.1–1.0)
NEUTROS ABS: 3.4 10*3/uL (ref 1.7–7.7)
NEUTROS PCT: 68 %
Platelet Count: 235 10*3/uL (ref 150–400)
RBC: 4.84 MIL/uL (ref 4.22–5.81)
RDW: 14.2 % (ref 11.5–15.5)
WBC Count: 4.9 10*3/uL (ref 4.0–10.5)
nRBC: 0 % (ref 0.0–0.2)

## 2018-03-22 LAB — CMP (CANCER CENTER ONLY)
ALBUMIN: 4.2 g/dL (ref 3.5–5.0)
ALK PHOS: 68 U/L (ref 38–126)
ALT: 9 U/L (ref 0–44)
AST: 19 U/L (ref 15–41)
Anion gap: 11 (ref 5–15)
BILIRUBIN TOTAL: 0.5 mg/dL (ref 0.3–1.2)
BUN: 11 mg/dL (ref 8–23)
CO2: 22 mmol/L (ref 22–32)
Calcium: 9.7 mg/dL (ref 8.9–10.3)
Chloride: 103 mmol/L (ref 98–111)
Creatinine: 1.41 mg/dL — ABNORMAL HIGH (ref 0.61–1.24)
GFR, EST NON AFRICAN AMERICAN: 48 mL/min — AB (ref >60)
GFR, Est AFR Am: 56 mL/min — ABNORMAL LOW (ref >60)
GLUCOSE: 140 mg/dL — AB (ref 70–99)
POTASSIUM: 4.2 mmol/L (ref 3.5–5.1)
Sodium: 136 mmol/L (ref 135–145)
TOTAL PROTEIN: 7.7 g/dL (ref 6.5–8.1)

## 2018-03-22 MED ORDER — SODIUM CHLORIDE 0.9 % IV SOLN
Freq: Once | INTRAVENOUS | Status: AC
  Administered 2018-03-22: 12:00:00 via INTRAVENOUS
  Filled 2018-03-22: qty 250

## 2018-03-22 MED ORDER — SODIUM CHLORIDE 0.9 % IV SOLN
10.4000 mg/kg | Freq: Once | INTRAVENOUS | Status: AC
  Administered 2018-03-22: 740 mg via INTRAVENOUS
  Filled 2018-03-22: qty 10

## 2018-03-22 NOTE — Patient Instructions (Signed)
[facility]cer Center Discharge Instructions for Patients Receiving Chemotherapy  Today you received the following chemotherapy agents: Imfinzi.  To help prevent nausea and vomiting after your treatment, we encourage you to take your nausea medication as directed.   If you develop nausea and vomiting that is not controlled by your nausea medication, call the clinic.   BELOW ARE SYMPTOMS THAT SHOULD BE REPORTED IMMEDIATELY:  *FEVER GREATER THAN 100.5 F  *CHILLS WITH OR WITHOUT FEVER  NAUSEA AND VOMITING THAT IS NOT CONTROLLED WITH YOUR NAUSEA MEDICATION  *UNUSUAL SHORTNESS OF BREATH  *UNUSUAL BRUISING OR BLEEDING  TENDERNESS IN MOUTH AND THROAT WITH OR WITHOUT PRESENCE OF ULCERS  *URINARY PROBLEMS  *BOWEL PROBLEMS  UNUSUAL RASH Items with * indicate a potential emergency and should be followed up as soon as possible.  Feel free to call the clinic should you have any questions or concerns. The clinic phone number is [phone].  Please show the CHEMO ALERT CARD at check-in to the Emergency Department and triage nurse.   

## 2018-03-22 NOTE — Progress Notes (Signed)
[place] Telephone:([phone]) [phone]   Fax:([phone]) [phone]  OFFICE PROGRESS NOTE  [name], MD [address] [place] [address]  DIAGNOSIS: Stage IIIA (T1a, N2, M0) non-small cell lung cancer, squamous cell carcinoma presented with right middle lobe pulmonary nodule in addition to ipsilateral hilar and ipsilateral mediastinal lymphadenopathy diagnosed in June 2018.  PRIOR THERAPY:  A course of concurrent chemoradiation with weekly carboplatin for AUC of 2 and paclitaxel 45 MG/M2. Status post 7 cycles.  CURRENT THERAPY: Consolidation immunotherapy with Imfinzi (Durvalumab) 10 MG/KG every 2 weeks.first dose 02/22/2017.  Status post 20 cycles.  INTERVAL HISTORY: Matthew Wilkins 72 y.o. male returns to the clinic today for follow-up visit accompanied by his daughter.  The patient is feeling fine today with no concerning complaints.  He had a recent skin surgery for skin cancer.  He denied having any current chest pain, shortness of breath except with exertion with mild cough and no hemoptysis.  He denied having any fever or chills.  He has no nausea, vomiting, diarrhea or constipation.  The patient is here today for evaluation before starting cycle #21.  MEDICAL HISTORY: Past Medical History:  Diagnosis Date  . Adenocarcinoma of right lung, stage 3 ([place]) 11/17/2016  . Anxiety   . Arthritis   . Coronary artery disease   . Diabetes mellitus   . DVT (deep venous thrombosis) ([place])   . Encounter for antineoplastic chemotherapy 11/17/2016  . History of radiation therapy 11/28/16-01/06/17   right lung was treated to 60 Gy in 30 fractions  . Hypercholesterolemia   . Hypertension   . PAD (peripheral artery disease) ([address])   . SOB (shortness of breath)     ALLERGIES:  is allergic to aspirin.  MEDICATIONS:  Current Outpatient Medications  Medication Sig Dispense Refill  . albuterol (PROVENTIL HFA;VENTOLIN HFA) 108 (90 Base) MCG/ACT inhaler Inhale 1-2 puffs into the  lungs every 6 (six) hours as needed for wheezing or shortness of breath.     [name] apixaban (ELIQUIS) 5 MG TABS tablet Take 1 tablet (5 mg total) by mouth 2 (two) times daily. 60 tablet 0  . ciprofloxacin (CIPRO) 500 MG tablet Take 1 tablet (500 mg total) by mouth 2 (two) times daily. One po bid x 7 days 14 tablet 0  . dicyclomine (BENTYL) 20 MG tablet Take 1 tablet by mouth 3 (three) times daily.    . feeding supplement, GLUCERNA SHAKE, (GLUCERNA SHAKE) LIQD Take 237 mLs by mouth 3 (three) times daily between meals. 90 Can 0  . Fluticasone-Salmeterol (ADVAIR DISKUS) 250-50 MCG/DOSE AEPB Inhale 1 puff into the lungs every 12 (twelve) hours.     . gabapentin (NEURONTIN) 300 MG capsule Take 1 capsule by mouth 3 (three) times daily.    [name] glipiZIDE (GLUCOTROL XL) 5 MG 24 hr tablet Take 10 mg by mouth 2 (two) times daily.     [name] guaiFENesin (MUCINEX) 600 MG 12 hr tablet Take 1 tablet (600 mg total) by mouth 2 (two) times daily. 10 tablet 0  . guaiFENesin-dextromethorphan (ROBITUSSIN DM) 100-10 MG/5ML syrup Take 5 mLs by mouth every 4 (four) hours as needed for cough. 118 mL 0  . levothyroxine (SYNTHROID, LEVOTHROID) 100 MCG tablet TAKE 1 TABLET DAILY BEFORE BREAKFAST. 30 tablet 0  . metFORMIN (GLUCOPHAGE-XR) 500 MG 24 hr tablet Take 2 tablets by mouth 2 (two) times daily.    . metroNIDAZOLE (FLAGYL) 500 MG tablet Take 1 tablet (500 mg total) by  mouth 2 (two) times daily. One po bid x 7 days 14 tablet 0  . ondansetron (ZOFRAN) 4 MG tablet Take 1 tablet (4 mg total) by mouth every 6 (six) hours as needed. 12 tablet 0  . pantoprazole (PROTONIX) 40 MG tablet Take 1 tablet (40 mg total) by mouth 2 (two) times daily. 30 tablet 0  . rosuvastatin (CRESTOR) 20 MG tablet Take 1 tablet (20 mg total) by mouth daily. 30 tablet 11  . sertraline (ZOLOFT) 25 MG tablet Take 50 mg by mouth daily.     . sucralfate (CARAFATE) 1 GM/10ML suspension Take 10 mLs (1 g total) by mouth 4 (four) times daily -  with meals and at bedtime.  420 mL 1  . ZETIA 10 MG tablet Take 10 mg by mouth daily.     No current facility-administered medications for this visit.     SURGICAL HISTORY:  Past Surgical History:  Procedure Laterality Date  . CARDIAC CATHETERIZATION N/A 06/10/2015   Procedure: Left Heart Cath and Coronary Angiography;  Surgeon: Burnell Blanks, MD;  Location: [facility] CV LAB;  Service: Cardiovascular;  Laterality: N/A;  . LOWER EXTREMITY ANGIOGRAM N/A 07/13/2011   Procedure: LOWER EXTREMITY ANGIOGRAM;  Surgeon: Burnell Blanks, MD;  Location[facility] CATH LAB;  Service: Cardiovascular;  Laterality: N/A;  . OTHER SURGICAL HISTORY    . OTHER SURGICAL HISTORY    . OTHER SURGICAL HISTORY    . PERCUTANEOUS STENT INTERVENTION Right 07/13/2011   Procedure: PERCUTANEOUS STENT INTERVENTION;  Surgeon: Burnell Blanks, MD;  Location: [facility] CATH LAB;  Service: Cardiovascular;  Laterality: Right;    REVIEW OF SYSTEMS:  A comprehensive review of systems was negative except for: Constitutional: positive for fatigue Respiratory: positive for dyspnea on exertion   PHYSICAL EXAMINATION: General appearance: alert, cooperative, fatigued and no distress Head: Normocephalic, without obvious abnormality, atraumatic Neck: no adenopathy, no JVD, supple, symmetrical, trachea midline and thyroid not enlarged, symmetric, no tenderness/mass/nodules Lymph nodes: Cervical, supraclavicular, and axillary nodes normal. Resp: clear to auscultation bilaterally Back: symmetric, no curvature. ROM normal. No CVA tenderness. Cardio: regular rate and rhythm, S1, S2 normal, no murmur, click, rub or gallop GI: soft, non-tender; bowel sounds normal; no masses,  no organomegaly Extremities: extremities normal, atraumatic, no cyanosis or edema  ECOG PERFORMANCE STATUS: 1 - Symptomatic but completely ambulatory  Blood pressure 112/78, pulse 77, temperature 97.7 F (36.5 C), temperature source Oral, resp. rate 12, height 5\' 9"  (1.753 m),  weight 161 lb 12.8 oz (73.4 kg), SpO2 100 %.  LABORATORY DATA: Lab Results  Component Value Date   WBC 4.9 03/22/2018   HGB 13.5 03/22/2018   HCT 41.4 03/22/2018   MCV 85.5 03/22/2018   PLT 235 03/22/2018      Chemistry      Component Value Date/Time   NA 137 03/08/2018 1132   NA 136 05/04/2017 1036   K 4.0 03/08/2018 1132   K 4.0 05/04/2017 1036   CL 106 03/08/2018 1132   CO2 20 (L) 03/08/2018 1132   CO2 16 (L) 05/04/2017 1036   BUN 16 03/08/2018 1132   BUN 15.5 05/04/2017 1036   CREATININE 1.19 03/08/2018 1132   CREATININE 1.1 05/04/2017 1036      Component Value Date/Time   CALCIUM 9.2 03/08/2018 1132   CALCIUM 9.6 05/04/2017 1036   ALKPHOS 65 03/08/2018 1132   ALKPHOS 93 05/04/2017 1036   AST 17 03/08/2018 1132   AST 13 05/04/2017 1036   ALT 9 03/08/2018 1132  ALT 7 05/04/2017 1036   BILITOT 0.4 03/08/2018 1132   BILITOT 0.56 05/04/2017 1036       RADIOGRAPHIC STUDIES: Dg Chest 2 View  Result Date: 02/21/2018 CLINICAL DATA:  Chest pain EXAM: CHEST - 2 VIEW COMPARISON:  11/19/2017 chest x-ray, 02/06/2018 chest CT FINDINGS: Cardiac shadows within normal limits. The lungs are well aerated bilaterally. Slight improvement in the degree of right middle and lower lobe density related to prior radiation therapy. No acute infiltrate or sizable effusion is noted. No acute bony abnormality is seen. IMPRESSION: Post radiation changes in the right middle and lower lobes although improved when compared with the prior chest x-ray. No acute abnormality noted. Electronically Signed   By: Inez Catalina M.D.   On: 02/21/2018 15:55   Ct Abdomen Pelvis W Contrast  Result Date: 02/21/2018 CLINICAL DATA:  Generalized lower abdomen pain. History of lung cancer in 2018. EXAM: CT ABDOMEN AND PELVIS WITH CONTRAST TECHNIQUE: Multidetector CT imaging of the abdomen and pelvis was performed using the standard protocol following bolus administration of intravenous contrast. CONTRAST:  136mL  ISOVUE-300 IOPAMIDOL (ISOVUE-300) INJECTION 61% COMPARISON:  Oct 02, 2009, chest CT August 28, 2016 FINDINGS: Lower chest: Patchy opacity is identified in the superior right lower lobe on image 1, incompletely included. The heart size is normal. There is no pleural effusion. Hepatobiliary: No focal liver abnormality is seen. No gallstones, gallbladder wall thickening, or biliary dilatation. Pancreas: Unremarkable. No pancreatic ductal dilatation or surrounding inflammatory changes. Spleen: Normal in size without focal abnormality. Adrenals/Urinary Tract: Adrenal glands are unremarkable. Kidneys are normal, without renal calculi or hydronephrosis. Right kidney cysts is identified. Bladder is unremarkable. Stomach/Bowel: There is minimal stranding surrounding the mid descending colon on image 37 series 2. There is diverticulosis of colon. The small bowel is normal. The appendix is normal. There is a small hiatal hernia. Fluid-filled stomach is identified. Vascular/Lymphatic: Aortic atherosclerosis. No enlarged abdominal or pelvic lymph nodes. Reproductive: Prostate calcifications are noted. Other: Small bilateral inguinal herniation of mesenteric fat are noted. Musculoskeletal: Degenerative joint changes of the spine are identified. IMPRESSION: Minimal stranding surrounding the mid descending colon. This can be seen in and early developing diverticulitis. Patchy opacity is identified in the superior right lower lobe on image 1, incompletely included. This is nonspecific. Electronically Signed   By: Abelardo Diesel M.D.   On: 02/21/2018 16:36    ASSESSMENT AND PLAN: This is a very pleasant 72 years old white male with a stage IIIA non-small cell lung cancer, squamous cell carcinoma  The patient underwent a course of concurrent chemoradiation with weekly carboplatin and paclitaxel status post 7 cycles. He had partial response after this treatment.  The patient is currently undergoing consolidation immunotherapy was  Imfinzi (Durvalumab) status post 20 cycles. The patient continues to tolerate his treatment well with no concerning adverse effects. I recommended for him to proceed with cycle #21 today as scheduled. I will see him back for follow-up visit in 2 weeks for evaluation before starting cycle #22. The patient voices understanding of current disease status and treatment options and is in agreement with the current care plan. All questions were answered. The patient knows to call the clinic with any problems, questions or concerns. We can certainly see the patient much sooner if necessary.  Disclaimer: This note was dictated with voice recognition software. Similar sounding words can inadvertently be transcribed and may not be corrected upon review.

## 2018-03-22 NOTE — Telephone Encounter (Signed)
3 cylces already scheduled per 11/7 los - no additional appts added

## 2018-03-22 NOTE — Telephone Encounter (Signed)
Appts scheduled avs/calendar printed per 11/7 los

## 2018-03-22 NOTE — Progress Notes (Signed)
[place] CSW Progress Note  Food bag provided to patient's step daughter from [employer].  Edwyna Shell, LCSW Clinical Social Worker Phone:  [phone]

## 2018-03-28 DIAGNOSIS — Z5189 Encounter for other specified aftercare: Secondary | ICD-10-CM | POA: Diagnosis not present

## 2018-04-01 ENCOUNTER — Encounter (HOSPITAL_COMMUNITY): Payer: Self-pay

## 2018-04-01 ENCOUNTER — Other Ambulatory Visit: Payer: Self-pay

## 2018-04-01 ENCOUNTER — Observation Stay (HOSPITAL_COMMUNITY)
Admission: EM | Admit: 2018-04-01 | Discharge: 2018-04-03 | Disposition: A | Discharge status: Home / Self Care | Payer: Medicare HMO | Attending: Family Medicine | Admitting: Family Medicine

## 2018-04-01 ENCOUNTER — Emergency Department (HOSPITAL_COMMUNITY): Payer: Medicare HMO

## 2018-04-01 DIAGNOSIS — C3490 Malignant neoplasm of unspecified part of unspecified bronchus or lung: Secondary | ICD-10-CM | POA: Diagnosis not present

## 2018-04-01 DIAGNOSIS — N200 Calculus of kidney: Secondary | ICD-10-CM

## 2018-04-01 DIAGNOSIS — J189 Pneumonia, unspecified organism: Secondary | ICD-10-CM | POA: Diagnosis not present

## 2018-04-01 DIAGNOSIS — G20 Parkinson's disease: Secondary | ICD-10-CM | POA: Diagnosis not present

## 2018-04-01 DIAGNOSIS — I2510 Atherosclerotic heart disease of native coronary artery without angina pectoris: Secondary | ICD-10-CM | POA: Diagnosis not present

## 2018-04-01 DIAGNOSIS — E0865 Diabetes mellitus due to underlying condition with hyperglycemia: Secondary | ICD-10-CM | POA: Diagnosis present

## 2018-04-01 DIAGNOSIS — R05 Cough: Secondary | ICD-10-CM | POA: Diagnosis present

## 2018-04-01 DIAGNOSIS — E119 Type 2 diabetes mellitus without complications: Secondary | ICD-10-CM | POA: Diagnosis not present

## 2018-04-01 DIAGNOSIS — Z79899 Other long term (current) drug therapy: Secondary | ICD-10-CM | POA: Diagnosis not present

## 2018-04-01 DIAGNOSIS — C3491 Malignant neoplasm of unspecified part of right bronchus or lung: Secondary | ICD-10-CM | POA: Diagnosis present

## 2018-04-01 DIAGNOSIS — E039 Hypothyroidism, unspecified: Secondary | ICD-10-CM | POA: Diagnosis not present

## 2018-04-01 DIAGNOSIS — F17210 Nicotine dependence, cigarettes, uncomplicated: Secondary | ICD-10-CM | POA: Diagnosis not present

## 2018-04-01 DIAGNOSIS — N1330 Unspecified hydronephrosis: Secondary | ICD-10-CM | POA: Diagnosis present

## 2018-04-01 DIAGNOSIS — Z86711 Personal history of pulmonary embolism: Secondary | ICD-10-CM | POA: Diagnosis not present

## 2018-04-01 DIAGNOSIS — J449 Chronic obstructive pulmonary disease, unspecified: Secondary | ICD-10-CM | POA: Diagnosis present

## 2018-04-01 DIAGNOSIS — J181 Lobar pneumonia, unspecified organism: Principal | ICD-10-CM | POA: Insufficient documentation

## 2018-04-01 DIAGNOSIS — IMO0002 Reserved for concepts with insufficient information to code with codable children: Secondary | ICD-10-CM | POA: Diagnosis present

## 2018-04-01 DIAGNOSIS — R531 Weakness: Secondary | ICD-10-CM | POA: Diagnosis not present

## 2018-04-01 DIAGNOSIS — R69 Illness, unspecified: Secondary | ICD-10-CM | POA: Diagnosis not present

## 2018-04-01 DIAGNOSIS — Z7984 Long term (current) use of oral hypoglycemic drugs: Secondary | ICD-10-CM | POA: Insufficient documentation

## 2018-04-01 DIAGNOSIS — Z720 Tobacco use: Secondary | ICD-10-CM | POA: Diagnosis present

## 2018-04-01 LAB — URINALYSIS, ROUTINE W REFLEX MICROSCOPIC
Bacteria, UA: NONE SEEN
Bilirubin Urine: NEGATIVE
Ketones, ur: NEGATIVE mg/dL
LEUKOCYTES UA: NEGATIVE
NITRITE: NEGATIVE
Protein, ur: NEGATIVE mg/dL
SPECIFIC GRAVITY, URINE: 1.021 (ref 1.005–1.030)
pH: 7 (ref 5.0–8.0)

## 2018-04-01 LAB — BASIC METABOLIC PANEL
ANION GAP: 8 (ref 5–15)
BUN: 14 mg/dL (ref 8–23)
CALCIUM: 9.6 mg/dL (ref 8.9–10.3)
CO2: 24 mmol/L (ref 22–32)
CREATININE: 1.25 mg/dL — AB (ref 0.61–1.24)
Chloride: 105 mmol/L (ref 98–111)
GFR calc Af Amer: >60 mL/min (ref >60)
GFR, EST NON AFRICAN AMERICAN: 55 mL/min — AB (ref >60)
Glucose, Bld: 212 mg/dL — ABNORMAL HIGH (ref 70–99)
Potassium: 3.7 mmol/L (ref 3.5–5.1)
Sodium: 137 mmol/L (ref 135–145)

## 2018-04-01 LAB — HEPATIC FUNCTION PANEL
ALT: 10 U/L (ref 0–44)
AST: 22 U/L (ref 15–41)
Albumin: 4.2 g/dL (ref 3.5–5.0)
Alkaline Phosphatase: 48 U/L (ref 38–126)
TOTAL PROTEIN: 7.4 g/dL (ref 6.5–8.1)
Total Bilirubin: 0.6 mg/dL (ref 0.3–1.2)

## 2018-04-01 LAB — CBC
HCT: 36.9 % — ABNORMAL LOW (ref 39.0–52.0)
Hemoglobin: 12.1 g/dL — ABNORMAL LOW (ref 13.0–17.0)
MCH: 28.2 pg (ref 26.0–34.0)
MCHC: 32.8 g/dL (ref 30.0–36.0)
MCV: 86 fL (ref 80.0–100.0)
NRBC: 0 % (ref 0.0–0.2)
PLATELETS: 181 10*3/uL (ref 150–400)
RBC: 4.29 MIL/uL (ref 4.22–5.81)
RDW: 14.5 % (ref 11.5–15.5)
WBC: 3.4 10*3/uL — ABNORMAL LOW (ref 4.0–10.5)

## 2018-04-01 LAB — LIPASE, BLOOD: Lipase: 27 U/L (ref 11–51)

## 2018-04-01 LAB — MRSA PCR SCREENING: MRSA by PCR: NEGATIVE

## 2018-04-01 MED ORDER — SODIUM CHLORIDE 0.9 % IV BOLUS
1000.0000 mL | Freq: Once | INTRAVENOUS | Status: AC
  Administered 2018-04-01: 1000 mL via INTRAVENOUS

## 2018-04-01 MED ORDER — VANCOMYCIN HCL IN DEXTROSE 750-5 MG/150ML-% IV SOLN
750.0000 mg | Freq: Two times a day (BID) | INTRAVENOUS | Status: DC
  Administered 2018-04-02: 750 mg via INTRAVENOUS
  Filled 2018-04-01 (×4): qty 150

## 2018-04-01 MED ORDER — ROSUVASTATIN CALCIUM 20 MG PO TABS
20.0000 mg | ORAL_TABLET | Freq: Every day | ORAL | Status: DC
  Administered 2018-04-02 – 2018-04-03 (×2): 20 mg via ORAL
  Filled 2018-04-01 (×2): qty 1

## 2018-04-01 MED ORDER — DICYCLOMINE HCL 10 MG PO CAPS
20.0000 mg | ORAL_CAPSULE | Freq: Three times a day (TID) | ORAL | Status: DC
  Administered 2018-04-02 – 2018-04-03 (×6): 20 mg via ORAL
  Filled 2018-04-01 (×7): qty 2

## 2018-04-01 MED ORDER — INSULIN ASPART 100 UNIT/ML ~~LOC~~ SOLN
0.0000 [IU] | Freq: Three times a day (TID) | SUBCUTANEOUS | Status: DC
  Administered 2018-04-02 – 2018-04-03 (×4): 2 [IU] via SUBCUTANEOUS

## 2018-04-01 MED ORDER — ONDANSETRON HCL 4 MG/2ML IJ SOLN: 4.0000 mg | Freq: Four times a day (QID) | INTRAMUSCULAR | Status: DC | PRN

## 2018-04-01 MED ORDER — VANCOMYCIN HCL 10 G IV SOLR
1500.0000 mg | Freq: Once | INTRAVENOUS | Status: AC
  Administered 2018-04-01: 1500 mg via INTRAVENOUS
  Filled 2018-04-01 (×2): qty 1500

## 2018-04-01 MED ORDER — ACETAMINOPHEN 650 MG RE SUPP: 650.0000 mg | Freq: Four times a day (QID) | RECTAL | Status: DC | PRN

## 2018-04-01 MED ORDER — ONDANSETRON HCL 4 MG PO TABS: 4.0000 mg | ORAL_TABLET | Freq: Four times a day (QID) | ORAL | Status: DC | PRN

## 2018-04-01 MED ORDER — GABAPENTIN 300 MG PO CAPS
300.0000 mg | ORAL_CAPSULE | Freq: Three times a day (TID) | ORAL | Status: DC
  Administered 2018-04-02 – 2018-04-03 (×6): 300 mg via ORAL
  Filled 2018-04-01 (×6): qty 1

## 2018-04-01 MED ORDER — SODIUM CHLORIDE 0.9 % IV SOLN: Freq: Once | INTRAVENOUS | Status: DC

## 2018-04-01 MED ORDER — EZETIMIBE 10 MG PO TABS
10.0000 mg | ORAL_TABLET | Freq: Every day | ORAL | Status: DC
  Administered 2018-04-02 – 2018-04-03 (×2): 10 mg via ORAL
  Filled 2018-04-01 (×2): qty 1

## 2018-04-01 MED ORDER — FENTANYL CITRATE (PF) 100 MCG/2ML IJ SOLN
50.0000 ug | Freq: Once | INTRAMUSCULAR | Status: AC
  Administered 2018-04-01: 50 ug via INTRAVENOUS
  Filled 2018-04-01: qty 2

## 2018-04-01 MED ORDER — SODIUM CHLORIDE 0.9 % IV SOLN
INTRAVENOUS | Status: DC | PRN
  Administered 2018-04-01 (×2): 500 mL via INTRAVENOUS

## 2018-04-01 MED ORDER — ALBUTEROL SULFATE (2.5 MG/3ML) 0.083% IN NEBU: 3.0000 mL | INHALATION_SOLUTION | Freq: Four times a day (QID) | RESPIRATORY_TRACT | Status: DC | PRN

## 2018-04-01 MED ORDER — GUAIFENESIN ER 600 MG PO TB12
600.0000 mg | ORAL_TABLET | Freq: Two times a day (BID) | ORAL | Status: DC
  Administered 2018-04-02 – 2018-04-03 (×4): 600 mg via ORAL
  Filled 2018-04-01 (×4): qty 1

## 2018-04-01 MED ORDER — SODIUM CHLORIDE 0.9 % IV SOLN
INTRAVENOUS | Status: DC
  Administered 2018-04-02 (×2): via INTRAVENOUS

## 2018-04-01 MED ORDER — ACETAMINOPHEN 325 MG PO TABS: 650.0000 mg | ORAL_TABLET | Freq: Four times a day (QID) | ORAL | Status: DC | PRN

## 2018-04-01 MED ORDER — MOMETASONE FURO-FORMOTEROL FUM 200-5 MCG/ACT IN AERO
2.0000 | INHALATION_SPRAY | Freq: Two times a day (BID) | RESPIRATORY_TRACT | Status: DC
  Administered 2018-04-02 – 2018-04-03 (×3): 2 via RESPIRATORY_TRACT
  Filled 2018-04-01: qty 8.8

## 2018-04-01 MED ORDER — IOPAMIDOL (ISOVUE-300) INJECTION 61%
100.0000 mL | Freq: Once | INTRAVENOUS | Status: AC | PRN
  Administered 2018-04-01: 100 mL via INTRAVENOUS

## 2018-04-01 MED ORDER — LEVOTHYROXINE SODIUM 100 MCG PO TABS
100.0000 ug | ORAL_TABLET | Freq: Every day | ORAL | Status: DC
  Administered 2018-04-02 – 2018-04-03 (×2): 100 ug via ORAL
  Filled 2018-04-01 (×2): qty 1

## 2018-04-01 MED ORDER — SODIUM CHLORIDE 0.9 % IV SOLN
2.0000 g | Freq: Three times a day (TID) | INTRAVENOUS | Status: DC
  Administered 2018-04-01 – 2018-04-03 (×4): 2 g via INTRAVENOUS
  Filled 2018-04-01 (×10): qty 2

## 2018-04-01 MED ORDER — APIXABAN 5 MG PO TABS
5.0000 mg | ORAL_TABLET | Freq: Two times a day (BID) | ORAL | Status: DC
  Administered 2018-04-02 – 2018-04-03 (×4): 5 mg via ORAL
  Filled 2018-04-01 (×4): qty 1

## 2018-04-01 MED ORDER — PANTOPRAZOLE SODIUM 40 MG PO TBEC
40.0000 mg | DELAYED_RELEASE_TABLET | Freq: Two times a day (BID) | ORAL | Status: DC
  Administered 2018-04-02 – 2018-04-03 (×4): 40 mg via ORAL
  Filled 2018-04-01 (×4): qty 1

## 2018-04-01 NOTE — Progress Notes (Signed)
Pharmacy Antibiotic Note  Matthew Wilkins is a 72 y.o. male admitted on 04/01/2018 with pneumonia.  Pharmacy has been consulted for cefepime and vancomycin dosing.  Plan:vancomycin 1.5gm iv x 1  Vancomycin 750mg  IV every 12 hours.  Goal trough 15-20 mcg/mL. cefepime 2gm iv q8h  Weight: 161 lb 13.1 oz (73.4 kg)  Temp (24hrs), Avg:97.6 F (36.4 C), Min:97.6 F (36.4 C), Max:97.6 F (36.4 C)  Recent Labs  Lab 04/01/18 1759  WBC 3.4*  CREATININE 1.25*    Estimated Creatinine Clearance: 52.6 mL/min (A) (by C-G formula based on SCr of 1.25 mg/dL (H)).    Allergies  Allergen Reactions  . Aspirin Other (See Comments)    Nose bleeds     Antimicrobials this admission: 11/17 cefepime >>  11/17 vancomycin >>   Microbiology results: 11/17 MRSA PCR: sent  Thank you for allowing pharmacy to be a part of this patient's care.  Matthew Wilkins 04/01/2018 8:05 PM

## 2018-04-01 NOTE — ED Provider Notes (Addendum)
[facility] EMERGENCY DEPARTMENT Provider Note   CSN: [id number] Arrival date & time: 04/01/18  1709     History   Chief Complaint Chief Complaint  Patient presents with  . Weakness    HPI Matthew Wilkins is a 72 y.o. male.  Level 5 caveat for acuity of condition.  Patient with a known diagnosis of adenocarcinoma of the right lung stage III presents with weakness today and pain in his upper abdomen.  Past medical history includes CAD, diabetes, hypertension, hypercholesterolemia.  No specific fever, chills, chest pain, dyspnea, cough, dysuria.     Past Medical History:  Diagnosis Date  . Adenocarcinoma of right lung, stage 3 ([place]) 11/17/2016  . Anxiety   . Arthritis   . Coronary artery disease   . Diabetes mellitus   . DVT (deep venous thrombosis) ([place])   . Encounter for antineoplastic chemotherapy 11/17/2016  . History of radiation therapy 11/28/16-01/06/17   right lung was treated to 60 Gy in 30 fractions  . Hypercholesterolemia   . Hypertension   . PAD (peripheral artery disease) ([place])   . SOB (shortness of breath)     Patient Active Problem List   Diagnosis Date Noted  . Reflux esophagitis 02/24/2018  . Esophageal ulcer without bleeding 02/24/2018  . Acute renal injury ([place]) 02/23/2018  . Diverticulitis of intestine 02/23/2018  . GERD (gastroesophageal reflux disease) 02/23/2018  . AKI (acute kidney injury) ([place]) 11/20/2017  . UTI (urinary tract infection) 11/20/2017  . Aphasia   . RUQ abdominal pain   . Hypothyroidism 07/27/2017  . HCAP (healthcare-associated pneumonia) 07/17/2017  . Diabetes mellitus due to underlying condition, uncontrolled ([place]) 07/17/2017  . Acute respiratory failure with hypoxia ([place]) 07/17/2017  . Encounter for antineoplastic immunotherapy 02/23/2017  . Adenocarcinoma of right lung, stage 3 ([place]) 11/17/2016  . Encounter for antineoplastic chemotherapy 11/17/2016  . Goals of care, counseling/discussion 11/17/2016  . Encounter for  smoking cessation counseling 11/17/2016  . Left leg DVT ([place]) 08/31/2016  . Malnutrition of moderate degree 08/30/2016  . Thrombocytopenia ([place]) 08/29/2016  . History of pulmonary embolus (PE) 08/28/2016  . Pulmonary nodule 08/28/2016  . Ureteral stone with hydronephrosis 08/28/2016  . Chest pain 08/28/2016  . CAD (coronary artery disease)   . TIA (transient ischemic attack) 11/27/2011  . Research study patient 08/30/2011  . PAD (peripheral artery disease) ([place]) 06/30/2011  . PVD (peripheral vascular disease) ([place]) 01/10/2011  . COPD (chronic obstructive pulmonary disease) ([place]) 07/06/2010  . TOBACCO ABUSE 11/12/2008  . Coronary atherosclerosis 09/08/2008  . DM type 2 (diabetes mellitus, type 2) ([place]) 08/26/2008  . HYPERCHOLESTEROLEMIA  IIA 08/26/2008  . ANXIETY 08/26/2008  . PARKINSON'S DISEASE 08/26/2008  . Essential hypertension 08/26/2008  . ARTHRITIS 08/26/2008  . SHORTNESS OF BREATH 08/26/2008    Past Surgical History:  Procedure Laterality Date  . CARDIAC CATHETERIZATION N/A 06/10/2015   Procedure: Left Heart Cath and Coronary Angiography;  Surgeon: Burnell Blanks, MD;  Location: [place] [facility] LAB;  Service: Cardiovascular;  Laterality: N/A;  . LOWER EXTREMITY ANGIOGRAM N/A 07/13/2011   Procedure: LOWER EXTREMITY ANGIOGRAM;  Surgeon: Burnell Blanks, MD;  Location: [facility] CATH LAB;  Service: Cardiovascular;  Laterality: N/A;  . OTHER SURGICAL HISTORY    . OTHER SURGICAL HISTORY    . OTHER SURGICAL HISTORY    . PERCUTANEOUS STENT INTERVENTION Right 07/13/2011   Procedure: PERCUTANEOUS STENT INTERVENTION;  Surgeon: Burnell Blanks, MD;  Location[facility] CATH LAB;  Service: Cardiovascular;  Laterality: Right;  Home Medications    Prior to Admission medications   Medication Sig Start Date End Date Taking? Authorizing Provider  albuterol (PROVENTIL HFA;VENTOLIN HFA) 108 (90 Base) MCG/ACT inhaler Inhale 1-2 puffs into the lungs every 6 (six) hours as  needed for wheezing or shortness of breath.  04/21/17  Yes [provider]  apixaban (ELIQUIS) 5 MG TABS tablet Take 1 tablet (5 mg total) by mouth 2 (two) times daily. 07/17/17  Yes Dhungel, Nishant, MD  ciprofloxacin (CIPRO) 500 MG tablet Take 1 tablet (500 mg total) by mouth 2 (two) times daily. One po bid x 7 days 02/21/18  Yes Julianne Rice, MD  dicyclomine (BENTYL) 20 MG tablet Take 1 tablet by mouth 3 (three) times daily. 10/26/17  Yes [provider]  feeding supplement, GLUCERNA SHAKE, (GLUCERNA SHAKE) LIQD Take 237 mLs by mouth 3 (three) times daily between meals. 07/17/17  Yes Dhungel, Nishant, MD  Fluticasone-Salmeterol (ADVAIR DISKUS) 250-50 MCG/DOSE AEPB Inhale 1 puff into the lungs every 12 (twelve) hours.  04/21/17  Yes [provider]  gabapentin (NEURONTIN) 300 MG capsule Take 1 capsule by mouth 3 (three) times daily. 04/19/16  Yes [provider]  glipiZIDE (GLUCOTROL XL) 5 MG 24 hr tablet Take 10 mg by mouth 2 (two) times daily.  03/11/15  Yes [provider]  guaiFENesin (MUCINEX) 600 MG 12 hr tablet Take 1 tablet (600 mg total) by mouth 2 (two) times daily. 07/17/17 07/17/18 Yes Dhungel, Nishant, MD  guaiFENesin-dextromethorphan (ROBITUSSIN DM) 100-10 MG/5ML syrup Take 5 mLs by mouth every 4 (four) hours as needed for cough. 07/17/17  Yes Dhungel, Nishant, MD  levothyroxine (SYNTHROID, LEVOTHROID) 100 MCG tablet TAKE 1 TABLET DAILY BEFORE BREAKFAST. 02/12/18  Yes Curcio, Roselie Awkward, NP  metFORMIN (GLUCOPHAGE-XR) 500 MG 24 hr tablet Take 2 tablets by mouth 2 (two) times daily. 04/19/16  Yes [provider]  metroNIDAZOLE (FLAGYL) 500 MG tablet Take 1 tablet (500 mg total) by mouth 2 (two) times daily. One po bid x 7 days 02/21/18  Yes Julianne Rice, MD  ondansetron (ZOFRAN) 4 MG tablet Take 1 tablet (4 mg total) by mouth every 6 (six) hours as needed. 02/21/18  Yes Julianne Rice, MD  pantoprazole (PROTONIX) 40 MG tablet Take 1 tablet (40  mg total) by mouth 2 (two) times daily. 02/24/18  Yes Annita Brod, MD  rosuvastatin (CRESTOR) 20 MG tablet Take 1 tablet (20 mg total) by mouth daily. 10/07/14  Yes Burnell Blanks, MD  sertraline (ZOLOFT) 25 MG tablet Take 50 mg by mouth daily.  10/27/16  Yes [provider]  sucralfate (CARAFATE) 1 GM/10ML suspension Take 10 mLs (1 g total) by mouth 4 (four) times daily -  with meals and at bedtime. 02/24/18  Yes Annita Brod, MD  ZETIA 10 MG tablet Take 10 mg by mouth daily. 04/03/15  Yes [provider]    Family History Family History  Family history unknown: Yes    Social History Social History   Tobacco Use  . Smoking status: Current Every Day Smoker    Packs/day: 1.00    Years: 65.00    Pack years: 65.00    Types: Cigarettes  . Smokeless tobacco: Never Used  Substance Use Topics  . Alcohol use: No  . Drug use: No     Allergies   Aspirin   Review of Systems Review of Systems  Unable to perform ROS: Acuity of condition     Physical Exam Updated Vital Signs BP [name])  118/48   Pulse 76   Temp 97.6 F (36.4 C) (Oral)   Resp (!) 21   Wt 73.4 kg   SpO2 100%   BMI 23.90 kg/m   Physical Exam  Constitutional: He is oriented to person, place, and time.  Pale, dehydrated  HENT:  Head: Normocephalic and atraumatic.  Eyes: Conjunctivae are normal.  Neck: Neck supple.  Cardiovascular: Normal rate and regular rhythm.  Pulmonary/Chest: Effort normal and breath sounds normal.  Abdominal: Soft. Bowel sounds are normal.  Musculoskeletal: Normal range of motion.  Neurological: He is alert and oriented to person, place, and time.  Skin: Skin is warm and dry.  Psychiatric: He has a normal mood and affect. His behavior is normal.  Nursing note and vitals reviewed.    ED Treatments / Results  Labs (all labs ordered are listed, but only abnormal results are displayed) Labs Reviewed  BASIC METABOLIC PANEL - Abnormal; Notable for  the following components:      Result Value   Glucose, Bld 212 (*)    Creatinine, Ser 1.25 (*)    GFR calc non Af Amer 55 (*)    All other components within normal limits  CBC - Abnormal; Notable for the following components:   WBC 3.4 (*)    Hemoglobin 12.1 (*)    HCT 36.9 (*)    All other components within normal limits  MRSA PCR SCREENING  HEPATIC FUNCTION PANEL  LIPASE, BLOOD  URINALYSIS, ROUTINE W REFLEX MICROSCOPIC    EKG EKG Interpretation  Date/Time:  Sunday April 01 2018 17:42:42 EST Ventricular Rate:  85 PR Interval:    QRS Duration: 86 QT Interval:  403 QTC Calculation: 480 R Axis:   49 Text Interpretation:  Sinus rhythm Low voltage, precordial leads Borderline T wave abnormalities Borderline prolonged QT interval Confirmed by Nat Christen [phone]) on 04/01/2018 7:16:08 PM Also confirmed by Nat Christen [phone])  on 04/01/2018 8:00:19 PM   Radiology Dg Chest 2 View  Result Date: 04/01/2018 CLINICAL DATA:  Weakness EXAM: CHEST - 2 VIEW COMPARISON:  02/21/2018 FINDINGS: Heart is normal size. Patchy airspace disease in the right middle and lower lobe lobes are similar prior study and likely reflect radiation changes. No confluent opacity on the left. No effusions or acute bony abnormality. IMPRESSION: Postradiation changes in the right middle and lower lobes, stable since prior study. No definite acute process. Electronically Signed   By: Rolm Baptise M.D.   On: 04/01/2018 19:36   Ct Abdomen Pelvis W Contrast  Result Date: 04/01/2018 CLINICAL DATA:  Weakness today. EXAM: CT ABDOMEN AND PELVIS WITH CONTRAST TECHNIQUE: Multidetector CT imaging of the abdomen and pelvis was performed using the standard protocol following bolus administration of intravenous contrast. CONTRAST:  198mL ISOVUE-300 IOPAMIDOL (ISOVUE-300) INJECTION 61% COMPARISON:  02/21/2018. FINDINGS: Lower chest: Progressive patchy opacity in the right lower lobe with air bronchograms. Minimal dependent  atelectasis in the left lower lobe. No pleural fluid seen. Hepatobiliary: No focal liver abnormality is seen. No gallstones, gallbladder wall thickening, or biliary dilatation. Pancreas: Unremarkable. No pancreatic ductal dilatation or surrounding inflammatory changes. Spleen: Normal in size without focal abnormality. Adrenals/Urinary Tract: 2.1 cm calculus in the right renal pelvis with mild dilatation of the right renal collecting system. There is also an additional 7 mm calculus in the mid to lower right kidney anteriorly. Also demonstrated is a small right renal cyst. There are also several tiny lower pole left renal calculi. Normal appearing adrenal glands, ureters and urinary bladder. Stomach/Bowel:  Multiple sigmoid and descending colon diverticula. Prominent stool throughout the colon. No evidence of appendicitis. Mildly dilated, mildly dilated stomach containing fluid and ingested material. Unremarkable small bowel. Vascular/Lymphatic: Atheromatous arterial calcifications without aneurysm. No enlarged lymph nodes. Reproductive: Mildly enlarged and heterogeneous prostate gland. Other: Small bilateral inguinal hernias containing fat. Musculoskeletal: Lumbar and lower thoracic spine degenerative changes and mild scoliosis. IMPRESSION: 1. Progressive right lower lobe pneumonia. 2. 2.1 cm calculus in the right renal pelvis causing mild right hydronephrosis. 3. Small, nonobstructing bilateral renal calculi. 4. Prominent stool throughout the colon. 5. Sigmoid and descending colon diverticulosis. 6. Mild prostatic hypertrophy. Electronically Signed   By: Claudie Revering M.D.   On: 04/01/2018 19:29    Procedures Procedures (including critical care time)  Medications Ordered in ED Medications  sodium chloride 0.9 % bolus 1,000 mL (1,000 mLs Intravenous New Bag/Given 04/01/18 1955)  sodium chloride 0.9 % bolus 1,000 mL (1,000 mLs Intravenous New Bag/Given 04/01/18 1957)  0.9 %  sodium chloride infusion (has no  administration in time range)  ceFEPIme (MAXIPIME) 2 g in sodium chloride 0.9 % 100 mL IVPB (has no administration in time range)  vancomycin (VANCOCIN) 1,500 mg in sodium chloride 0.9 % 500 mL IVPB (has no administration in time range)  vancomycin (VANCOCIN) IVPB 750 mg/150 ml premix (has no administration in time range)  iopamidol (ISOVUE-300) 61 % injection 100 mL (100 mLs Intravenous Contrast Given 04/01/18 1852)  fentaNYL (SUBLIMAZE) injection 50 mcg (50 mcg Intravenous Given 04/01/18 2021)     Initial Impression / Assessment and Plan / ED Course  I have reviewed the triage vital signs and the nursing notes.  Pertinent labs & imaging results that were available during my care of the patient were reviewed by me and considered in my medical decision making (see chart for details).     Patient presents with malaise starting earlier today.  CT abdomen pelvis reveals a progressive right lower lobe pneumonia.  Will Rx antibiotics for healthcare associated pneumonia.  Admit to general medicine.   CRITICAL CARE Performed by: Nat Christen Total critical care time: 30 minutes Critical care time was exclusive of separately billable procedures and treating other patients. Critical care was necessary to treat or prevent imminent or life-threatening deterioration. Critical care was time spent personally by me on the following activities: development of treatment plan with patient and/or surrogate as well as nursing, discussions with consultants, evaluation of patient's response to treatment, examination of patient, obtaining history from patient or surrogate, ordering and performing treatments and interventions, ordering and review of laboratory studies, ordering and review of radiographic studies, pulse oximetry and re-evaluation of patient's condition. Final Clinical Impressions(s) / ED Diagnoses   Final diagnoses:  HCAP (healthcare-associated pneumonia)    ED Discharge Orders    None         Nat Christen, MD 04/01/18 2042    Nat Christen, MD 04/01/18 2126

## 2018-04-01 NOTE — ED Triage Notes (Signed)
Pt reports that he woke up feeling weak. Pt reports abdominal tightness for 6 months. Pt reports pain with palpation. Pt states he is receiving chemo for lung cancer

## 2018-04-01 NOTE — H&P (Signed)
TRH H&P    Patient Demographics:    Matthew Wilkins, is a 72 y.o. male  MRN: [id number]  DOB - Jul 20, 1945  Admit Date - 04/01/2018  Referring MD/NP/PA: Dr. Lacinda Axon  Outpatient Primary MD for the patient is Matthew Sacramento, MD  Patient coming from: Home  Chief complaint-abdominal pain   HPI:    Matthew Wilkins  is a 72 y.o. male, with a history of diabetes mellitus type 2, DVT and pulmonary embolism on anticoagulation with apixaban, hypertension, GERD, adenocarcinoma of the right lung stage III on chemotherapy came to hospital with chief complaint of abdominal pain.  Patient was recently discharged in October with diverticulitis on Cipro and Flagyl.  Today patient says that he had abdominal pain since 11 AM also has been having cough with difficulty breathing when he lies down.  He denies coughing up any phlegm.  No fever or chills. Denies nausea vomiting or diarrhea. Denies dizziness or blurred vision. In the ED CT scan of the abdomen and pelvis showed progressive right lower lobe pneumonia.  Patient started on vancomycin and cefepime.    Review of systems:    In addition to the HPI above,    All other systems reviewed and are negative.   With Past History of the following :    Past Medical History:  Diagnosis Date  . Adenocarcinoma of right lung, stage 3 ([place]) 11/17/2016  . Anxiety   . Arthritis   . Coronary artery disease   . Diabetes mellitus   . DVT (deep venous thrombosis) ([place])   . Encounter for antineoplastic chemotherapy 11/17/2016  . History of radiation therapy 11/28/16-01/06/17   right lung was treated to 60 Gy in 30 fractions  . Hypercholesterolemia   . Hypertension   . PAD (peripheral artery disease) ([place])   . SOB (shortness of breath)       Past Surgical History:  Procedure Laterality Date  . CARDIAC CATHETERIZATION N/A 06/10/2015   Procedure: Left Heart Cath and Coronary Angiography;   Surgeon: Burnell Blanks, MD;  Location: [facility];  Service: Cardiovascular;  Laterality: N/A;  . LOWER EXTREMITY ANGIOGRAM N/A 07/13/2011   Procedure: LOWER EXTREMITY ANGIOGRAM;  Surgeon: Burnell Blanks, MD;  Location: [facility] CATH LAB;  Service: Cardiovascular;  Laterality: N/A;  . OTHER SURGICAL HISTORY    . OTHER SURGICAL HISTORY    . OTHER SURGICAL HISTORY    . PERCUTANEOUS STENT INTERVENTION Right 07/13/2011   Procedure: PERCUTANEOUS STENT INTERVENTION;  Surgeon: Burnell Blanks, MD;  Location: [facility] CATH LAB;  Service: Cardiovascular;  Laterality: Right;      Social History:      Social History   Tobacco Use  . Smoking status: Current Every Day Smoker    Packs/day: 1.00    Years: 65.00    Pack years: 65.00    Types: Cigarettes  . Smokeless tobacco: Never Used  Substance Use Topics  . Alcohol use: No       Family History :     Family History  Family history unknown:  Yes      Home Medications:   Prior to Admission medications   Medication Sig Start Date End Date Taking? Authorizing Provider  albuterol (PROVENTIL HFA;VENTOLIN HFA) 108 (90 Base) MCG/ACT inhaler Inhale 1-2 puffs into the lungs every 6 (six) hours as needed for wheezing or shortness of breath.  04/21/17  Yes [provider]  apixaban (ELIQUIS) 5 MG TABS tablet Take 1 tablet (5 mg total) by mouth 2 (two) times daily. 07/17/17  Yes Dhungel, Nishant, MD  ciprofloxacin (CIPRO) 500 MG tablet Take 1 tablet (500 mg total) by mouth 2 (two) times daily. One po bid x 7 days 02/21/18  Yes Julianne Rice, MD  dicyclomine (BENTYL) 20 MG tablet Take 1 tablet by mouth 3 (three) times daily. 10/26/17  Yes [provider]  feeding supplement, GLUCERNA SHAKE, (GLUCERNA SHAKE) LIQD Take 237 mLs by mouth 3 (three) times daily between meals. 07/17/17  Yes Dhungel, Nishant, MD  Fluticasone-Salmeterol (ADVAIR DISKUS) 250-50 MCG/DOSE AEPB Inhale 1 puff into the lungs every 12 (twelve) hours.   04/21/17  Yes [provider]  gabapentin (NEURONTIN) 300 MG capsule Take 1 capsule by mouth 3 (three) times daily. 04/19/16  Yes [provider]  glipiZIDE (GLUCOTROL XL) 5 MG 24 hr tablet Take 10 mg by mouth 2 (two) times daily.  03/11/15  Yes [provider]  guaiFENesin (MUCINEX) 600 MG 12 hr tablet Take 1 tablet (600 mg total) by mouth 2 (two) times daily. 07/17/17 07/17/18 Yes Dhungel, Nishant, MD  guaiFENesin-dextromethorphan (ROBITUSSIN DM) 100-10 MG/5ML syrup Take 5 mLs by mouth every 4 (four) hours as needed for cough. 07/17/17  Yes Dhungel, Nishant, MD  levothyroxine (SYNTHROID, LEVOTHROID) 100 MCG tablet TAKE 1 TABLET DAILY BEFORE BREAKFAST. 02/12/18  Yes Curcio, Roselie Awkward, NP  metFORMIN (GLUCOPHAGE-XR) 500 MG 24 hr tablet Take 2 tablets by mouth 2 (two) times daily. 04/19/16  Yes [provider]  metroNIDAZOLE (FLAGYL) 500 MG tablet Take 1 tablet (500 mg total) by mouth 2 (two) times daily. One po bid x 7 days 02/21/18  Yes Julianne Rice, MD  ondansetron (ZOFRAN) 4 MG tablet Take 1 tablet (4 mg total) by mouth every 6 (six) hours as needed. 02/21/18  Yes Julianne Rice, MD  pantoprazole (PROTONIX) 40 MG tablet Take 1 tablet (40 mg total) by mouth 2 (two) times daily. 02/24/18  Yes Annita Brod, MD  rosuvastatin (CRESTOR) 20 MG tablet Take 1 tablet (20 mg total) by mouth daily. 10/07/14  Yes Burnell Blanks, MD  sertraline (ZOLOFT) 25 MG tablet Take 50 mg by mouth daily.  10/27/16  Yes [provider]  sucralfate (CARAFATE) 1 GM/10ML suspension Take 10 mLs (1 g total) by mouth 4 (four) times daily -  with meals and at bedtime. 02/24/18  Yes Annita Brod, MD  ZETIA 10 MG tablet Take 10 mg by mouth daily. 04/03/15  Yes [provider]     Allergies:     Allergies  Allergen Reactions  . Aspirin Other (See Comments)    Nose bleeds      Physical Exam:   Vitals  Blood pressure 122/70, pulse 71, temperature 97.6 F  (36.4 C), temperature source Oral, resp. rate (!) 24, weight 73.4 kg, SpO2 98 %.  1.  General: Appears in no acute distress  2. Psychiatric:  Intact judgement and  insight, awake alert, oriented x 3.  3. Neurologic: No focal neurological deficits, all cranial nerves intact.Strength 5/5 all 4 extremities, sensation intact all 4 extremities,  plantars down going.  4. Eyes :  anicteric sclerae, moist conjunctivae with no lid lag. PERRLA.  5. ENMT:  Oropharynx clear with moist mucous membranes and good dentition  6. Neck:  supple, no cervical lymphadenopathy appriciated, No thyromegaly  7. Respiratory : Normal respiratory effort, good air movement bilaterally,clear to  auscultation bilaterally  8. Cardiovascular : RRR, no gallops, rubs or murmurs, no leg edema  9. Gastrointestinal:  Positive bowel sounds, abdomen soft,tender to palpation in right upper quadrant,no hepatosplenomegaly, no rigidity or guarding       10. Skin:  No cyanosis, normal texture and turgor, no rash, lesions or ulcers  11.Musculoskeletal:  Good muscle tone,  joints appear normal , no effusions,  normal range of motion    Data Review:    CBC Recent Labs  Lab 04/01/18 1759  WBC 3.4*  HGB 12.1*  HCT 36.9*  PLT 181  MCV 86.0  MCH 28.2  MCHC 32.8  RDW 14.5   ------------------------------------------------------------------------------------------------------------------  Results for orders placed or performed during the hospital encounter of 04/01/18 (from the past 48 hour(s))  Basic metabolic panel     Status: Abnormal   Collection Time: 04/01/18  5:59 PM  Result Value Ref Range   Sodium 137 135 - 145 mmol/L   Potassium 3.7 3.5 - 5.1 mmol/L   Chloride 105 98 - 111 mmol/L   CO2 24 22 - 32 mmol/L   Glucose, Bld 212 (H) 70 - 99 mg/dL   BUN 14 8 - 23 mg/dL   Creatinine, Ser 1.25 (H) 0.61 - 1.24 mg/dL   Calcium 9.6 8.9 - 10.3 mg/dL   GFR calc non Af Amer 55 (L) >60 mL/min   GFR calc Af Amer  >60 >60 mL/min    Comment: (NOTE) The eGFR has been calculated using the CKD EPI equation. This calculation has not been validated in all clinical situations. eGFR's persistently <60 mL/min signify possible Chronic Kidney Disease.    Anion gap 8 5 - 15    Comment: Performed at [facility], [address] [place] [address] [place], [place] [address]  CBC     Status: Abnormal   Collection Time: 04/01/18  5:59 PM  Result Value Ref Range   WBC 3.4 (L) 4.0 - 10.5 K/uL   RBC 4.29 4.22 - 5.81 MIL/uL   Hemoglobin 12.1 (L) 13.0 - 17.0 g/dL   HCT 36.9 (L) 39.0 - 52.0 %   MCV 86.0 80.0 - 100.0 fL   MCH 28.2 26.0 - 34.0 pg   MCHC 32.8 30.0 - 36.0 g/dL   RDW 14.5 11.5 - 15.5 %   Platelets 181 150 - 400 K/uL   nRBC 0.0 0.0 - 0.2 %    Comment: Performed at [facility], [address] [place], [place] [address]  Hepatic function panel     Status: None   Collection Time: 04/01/18  5:59 PM  Result Value Ref Range   Total Protein 7.4 6.5 - 8.1 g/dL   Albumin 4.2 3.5 - 5.0 g/dL   AST 22 15 - 41 U/L   ALT 10 0 - 44 U/L   Alkaline Phosphatase 48 38 - 126 U/L   Total Bilirubin 0.6 0.3 - 1.2 mg/dL   Bilirubin, Direct <0.1 0.0 - 0.2 mg/dL   Indirect Bilirubin NOT CALCULATED 0.3 - 0.9 mg/dL    Comment: Performed at [facility], [address], [place], [place] [address]  Lipase, blood     Status: None   Collection Time: 04/01/18  5:59  PM  Result Value Ref Range   Lipase 27 11 - 51 U/L    Comment: Performed at [facility], [address], [place], [place] [address]  Urinalysis, Routine w reflex microscopic     Status: Abnormal   Collection Time: 04/01/18  7:30 PM  Result Value Ref Range   Color, Urine YELLOW YELLOW   APPearance CLOUDY (A) CLEAR   Specific Gravity, Urine 1.021 1.005 - 1.030   pH 7.0 5.0 - 8.0   Glucose, UA >=500 (A) NEGATIVE mg/dL   Hgb urine dipstick SMALL (A) NEGATIVE   Bilirubin Urine NEGATIVE NEGATIVE   Ketones, ur NEGATIVE NEGATIVE mg/dL   Protein, ur NEGATIVE NEGATIVE mg/dL   Nitrite  NEGATIVE NEGATIVE   Leukocytes, UA NEGATIVE NEGATIVE   RBC / HPF 11-20 0 - 5 RBC/hpf   WBC, UA 6-10 0 - 5 WBC/hpf   Bacteria, UA NONE SEEN NONE SEEN   Mucus PRESENT    Budding Yeast PRESENT    Ca Oxalate Crys, UA PRESENT     Comment: Performed at [facility], [address], [place], [place] [address]    Chemistries  Recent Labs  Lab 04/01/18 1759  NA 137  K 3.7  CL 105  CO2 24  GLUCOSE 212*  BUN 14  CREATININE 1.25*  CALCIUM 9.6  AST 22  ALT 10  ALKPHOS 48  BILITOT 0.6   ------------------------------------------------------------------------------------------------------------------  ------------------------------------------------------------------------------------------------------------------ GFR: Estimated Creatinine Clearance: 52.6 mL/min (A) (by C-G formula based on SCr of 1.25 mg/dL (H)). Liver Function Tests: Recent Labs  Lab 04/01/18 1759  AST 22  ALT 10  ALKPHOS 48  BILITOT 0.6  PROT 7.4  ALBUMIN 4.2   Recent Labs  Lab 04/01/18 1759  LIPASE 27    --------------------------------------------------------------------------------------------------------------- Urine analysis:    Component Value Date/Time   COLORURINE YELLOW 04/01/2018 1930   APPEARANCEUR CLOUDY (A) 04/01/2018 1930   LABSPEC 1.021 04/01/2018 1930   PHURINE 7.0 04/01/2018 1930   GLUCOSEU >=500 (A) 04/01/2018 1930   HGBUR SMALL (A) 04/01/2018 1930   BILIRUBINUR NEGATIVE 04/01/2018 1930   KETONESUR NEGATIVE 04/01/2018 1930   PROTEINUR NEGATIVE 04/01/2018 1930   UROBILINOGEN 0.2 01/27/2010 1529   NITRITE NEGATIVE 04/01/2018 1930   LEUKOCYTESUR NEGATIVE 04/01/2018 1930      Imaging Results:    Dg Chest 2 View  Result Date: 04/01/2018 CLINICAL DATA:  Weakness EXAM: CHEST - 2 VIEW COMPARISON:  02/21/2018 FINDINGS: Heart is normal size. Patchy airspace disease in the right middle and lower lobe lobes are similar prior study and likely reflect radiation changes. No confluent  opacity on the left. No effusions or acute bony abnormality. IMPRESSION: Postradiation changes in the right middle and lower lobes, stable since prior study. No definite acute process. Electronically Signed   By: Rolm Baptise M.D.   On: 04/01/2018 19:36   Ct Abdomen Pelvis W Contrast  Result Date: 04/01/2018 CLINICAL DATA:  Weakness today. EXAM: CT ABDOMEN AND PELVIS WITH CONTRAST TECHNIQUE: Multidetector CT imaging of the abdomen and pelvis was performed using the standard protocol following bolus administration of intravenous contrast. CONTRAST:  145m ISOVUE-300 IOPAMIDOL (ISOVUE-300) INJECTION 61% COMPARISON:  02/21/2018. FINDINGS: Lower chest: Progressive patchy opacity in the right lower lobe with air bronchograms. Minimal dependent atelectasis in the left lower lobe. No pleural fluid seen. Hepatobiliary: No focal liver abnormality is seen. No gallstones, gallbladder wall thickening, or biliary dilatation. Pancreas: Unremarkable. No pancreatic ductal dilatation or surrounding inflammatory changes. Spleen: Normal in size without focal abnormality. Adrenals/Urinary Tract:  2.1 cm calculus in the right renal pelvis with mild dilatation of the right renal collecting system. There is also an additional 7 mm calculus in the mid to lower right kidney anteriorly. Also demonstrated is a small right renal cyst. There are also several tiny lower pole left renal calculi. Normal appearing adrenal glands, ureters and urinary bladder. Stomach/Bowel: Multiple sigmoid and descending colon diverticula. Prominent stool throughout the colon. No evidence of appendicitis. Mildly dilated, mildly dilated stomach containing fluid and ingested material. Unremarkable small bowel. Vascular/Lymphatic: Atheromatous arterial calcifications without aneurysm. No enlarged lymph nodes. Reproductive: Mildly enlarged and heterogeneous prostate gland. Other: Small bilateral inguinal hernias containing fat. Musculoskeletal: Lumbar and lower  thoracic spine degenerative changes and mild scoliosis. IMPRESSION: 1. Progressive right lower lobe pneumonia. 2. 2.1 cm calculus in the right renal pelvis causing mild right hydronephrosis. 3. Small, nonobstructing bilateral renal calculi. 4. Prominent stool throughout the colon. 5. Sigmoid and descending colon diverticulosis. 6. Mild prostatic hypertrophy. Electronically Signed   By: Claudie Revering M.D.   On: 04/01/2018 19:29    My personal review of EKG: Rhythm NSR, borderline QT prolongation    Assessment & Plan:    Active Problems:   CAP (community acquired pneumonia)   1. Community-acquired pneumonia-patient presenting with abdominal pain, CT abdomen showed progressive right lower lobe pneumonia.  Patient started on vancomycin and cefepime in the ED.  Will obtain blood cultures x2.  Continue vancomycin and cefepime per pharmacy consultation.  2. Mild right hydronephrosis-CT scan showed 2.1 cm calculus in the right renal pelvis causing mild right hydronephrosis.  Creatinine is stable at 1.25, will obtain renal ultrasound in a.m. consider urology consultation.  3. Diabetes mellitus type 2-hold oral hypoglycemic agents, will start sliding scale insulin with NovoLog.  4. History of pulmonary embolism-continue apixaban  5. Adenocarcinoma of right lung stage III -continue chemotherapy as per oncology.  6. GERD-continue Protonix, will hold Carafate at this time.  Patient has been taking Carafate since October.    DVT Prophylaxis-   Apixaban  AM Labs Ordered, also please review Full Orders  Family Communication: Admission, patients condition and plan of care including tests being ordered have been discussed with the patient and his daughter at bedside* who indicate understanding and agree with the plan and Code Status.  Code Status: Full code  Admission status: Observation  Time spent in minutes : 60 minutes   Oswald Hillock M.D on 04/01/2018 at 9:55 PM  Between 7am to 7pm - Pager  - [phone]. After 7pm go to [email or web address] - password [facility]  [facility] - Office  [phone]

## 2018-04-02 DIAGNOSIS — Z86711 Personal history of pulmonary embolism: Secondary | ICD-10-CM | POA: Diagnosis not present

## 2018-04-02 DIAGNOSIS — J449 Chronic obstructive pulmonary disease, unspecified: Secondary | ICD-10-CM | POA: Diagnosis not present

## 2018-04-02 DIAGNOSIS — E0865 Diabetes mellitus due to underlying condition with hyperglycemia: Secondary | ICD-10-CM | POA: Diagnosis not present

## 2018-04-02 DIAGNOSIS — J181 Lobar pneumonia, unspecified organism: Secondary | ICD-10-CM

## 2018-04-02 DIAGNOSIS — J189 Pneumonia, unspecified organism: Secondary | ICD-10-CM | POA: Diagnosis not present

## 2018-04-02 DIAGNOSIS — C3491 Malignant neoplasm of unspecified part of right bronchus or lung: Secondary | ICD-10-CM | POA: Diagnosis not present

## 2018-04-02 DIAGNOSIS — N1330 Unspecified hydronephrosis: Secondary | ICD-10-CM | POA: Diagnosis not present

## 2018-04-02 DIAGNOSIS — R69 Illness, unspecified: Secondary | ICD-10-CM | POA: Diagnosis not present

## 2018-04-02 DIAGNOSIS — N200 Calculus of kidney: Secondary | ICD-10-CM | POA: Diagnosis not present

## 2018-04-02 LAB — COMPREHENSIVE METABOLIC PANEL
ALK PHOS: 50 U/L (ref 38–126)
ALT: 9 U/L (ref 0–44)
ANION GAP: 7 (ref 5–15)
AST: 20 U/L (ref 15–41)
Albumin: 3.8 g/dL (ref 3.5–5.0)
BILIRUBIN TOTAL: 0.9 mg/dL (ref 0.3–1.2)
BUN: 10 mg/dL (ref 8–23)
CALCIUM: 8.6 mg/dL — AB (ref 8.9–10.3)
CO2: 24 mmol/L (ref 22–32)
Chloride: 106 mmol/L (ref 98–111)
Creatinine, Ser: 1.25 mg/dL — ABNORMAL HIGH (ref 0.61–1.24)
GFR calc Af Amer: >60 mL/min (ref >60)
GFR, EST NON AFRICAN AMERICAN: 55 mL/min — AB (ref >60)
GLUCOSE: 197 mg/dL — AB (ref 70–99)
POTASSIUM: 3.4 mmol/L — AB (ref 3.5–5.1)
Sodium: 137 mmol/L (ref 135–145)
TOTAL PROTEIN: 7 g/dL (ref 6.5–8.1)

## 2018-04-02 LAB — GLUCOSE, CAPILLARY
GLUCOSE-CAPILLARY: 197 mg/dL — AB (ref 70–99)
GLUCOSE-CAPILLARY: 151 mg/dL — AB (ref 70–99)
GLUCOSE-CAPILLARY: 109 mg/dL — AB (ref 70–99)
GLUCOSE-CAPILLARY: 175 mg/dL — AB (ref 70–99)

## 2018-04-02 LAB — CBC
HEMATOCRIT: 37.7 % — AB (ref 39.0–52.0)
HEMOGLOBIN: 12 g/dL — AB (ref 13.0–17.0)
MCH: 27.8 pg (ref 26.0–34.0)
MCHC: 31.8 g/dL (ref 30.0–36.0)
MCV: 87.3 fL (ref 80.0–100.0)
Platelets: 161 10*3/uL (ref 150–400)
RBC: 4.32 MIL/uL (ref 4.22–5.81)
RDW: 14.4 % (ref 11.5–15.5)
WBC: 4.3 10*3/uL (ref 4.0–10.5)
nRBC: 0 % (ref 0.0–0.2)

## 2018-04-02 LAB — HEMOGLOBIN A1C
Hgb A1c MFr Bld: 8.1 % — ABNORMAL HIGH (ref 4.8–5.6)
Mean Plasma Glucose: 185.77 mg/dL

## 2018-04-02 MED ORDER — ALUM & MAG HYDROXIDE-SIMETH 200-200-20 MG/5ML PO SUSP
30.0000 mL | Freq: Four times a day (QID) | ORAL | Status: DC | PRN
  Administered 2018-04-02: 30 mL via ORAL
  Filled 2018-04-02: qty 30

## 2018-04-02 MED ORDER — SUCRALFATE 1 GM/10ML PO SUSP
1.0000 g | Freq: Three times a day (TID) | ORAL | Status: DC
  Administered 2018-04-02 – 2018-04-03 (×6): 1 g via ORAL
  Filled 2018-04-02 (×5): qty 10

## 2018-04-02 MED ORDER — SIMETHICONE 80 MG PO CHEW: 80.0000 mg | CHEWABLE_TABLET | Freq: Four times a day (QID) | ORAL | Status: DC | PRN

## 2018-04-02 NOTE — Plan of Care (Signed)
  Problem: Acute Rehab PT Goals(only PT should resolve) Goal: Patient Will Transfer Sit To/From Stand Outcome: Progressing Flowsheets (Taken 04/02/2018 1541) Patient will transfer sit to/from stand: with modified independence Goal: Pt Will Transfer Bed To Chair/Chair To Bed Outcome: Progressing Flowsheets (Taken 04/02/2018 1541) Pt will Transfer Bed to Chair/Chair to Bed: with modified independence Goal: Pt Will Ambulate Outcome: Progressing Flowsheets (Taken 04/02/2018 1541) Pt will Ambulate: 50 feet; with modified independence; with rolling walker   3:42 PM, 04/02/18 Lonell Grandchild, MPT Physical Therapist with [facility] 336 [phone] office [phone] mobile phone

## 2018-04-02 NOTE — Care Management Obs Status (Signed)
[place] NOTIFICATION   Patient Details  Name: Matthew Wilkins MRN: [id number] Date of Birth: 08-08-1945   Medicare Observation Status Notification Given:  Yes    Sherald Barge, RN 04/02/2018, 12:46 PM

## 2018-04-02 NOTE — Progress Notes (Signed)
PROGRESS NOTE  Matthew Wilkins  GYI:[id number] DOB: 03/19/1946 DOA: 04/01/2018 PCP: [name], MD  Outpatient Specialists: Oncology, Dr. Julien Nordmann Brief Narrative: 72yo male presented with abdominal pain, cough, and intermittent dyspnea. CT abdomen and pelvis showed no recurrent diverticulitis but did demonstrate right lung base opacification with air bronchograms consistent with pneumonia. Abx started and patient admitted. PO intake has been negligible.  Assessment & Plan: Active Problems:   CAP (community acquired pneumonia)  RLL HCAP:  - Continue cefepime, DC vanc with negative MRSA swab. - Monitor blood cultures at least 24 hours in patient with weakness, malaise who is undergoing active immunotherapy.   Stage IIIA NSCLC: Undergoing concurrent radiation and consolidation immunotherapy, s/p 7 cycles carboplatin + paclitaxel. - Due for cycle #22 of imfinzi (durvalumab) soon. Will need to be evaluated by his oncologist, Dr. Julien Nordmann prior to this in the setting of pulmonary infection, common with this medication.    Mild right hydronephrosis, nephrolithiasis: 2.1cm calculus in right renal pelvis, +CaOxalate crystals in microscopy. Renal function is stable and the patient's symptoms are not likely attributable to renal colic. Though erroneously not mentioned on report from recent CT abd/pelvis, this stone is stable and hydro is stable for >1 year. - Renal U/S ordered.  - Discussed with Dr. Jeffie Pollock who recommends outpatient follow up for consideration of percutaneous nephrolithotomy. Would need to hold anticocgulation. [facility] to reach out to patient for scheduling.  History of PE:  - Continue eliquis  T2DM:  - SSI  GERD, IBS, and abdominal pain: No evidence of diverticulitis or complication from recent infection on CT abd/pelvis. - Continue protonix, restart carafate, add simethicone, and continue bentyl  DVT prophylaxis: Eliquis Code Status: Full Family Communication: At  bedside Disposition Plan: Home when improved, getting PT evaluation.  Consultants:   Urology, Dr. Jeffie Pollock by phone only  Procedures:   None  Antimicrobials:  Vancomycin 11/17 - 11/18  Cefepime 11/17 >>   Subjective: Remains weak, hasn't been able to eat anything since arrival to the floor. Denies shortness of breath or chest pain. Describes pain diffusely in abdomen radiating up to the mid chest into the neck. Denies dysphagia, odynophagia.   Objective: Vitals:   04/01/18 2300 04/01/18 2329 04/02/18 0625 04/02/18 0919  BP: (!) 146/87 (!) 179/98 (!) 168/97   Pulse: 84 88 92   Resp: 14 18 18    Temp:  (!) 97.5 F (36.4 C) (!) 97.4 F (36.3 C)   TempSrc:  Oral Oral   SpO2: 99% 100% 100% 98%  Weight:  71.8 kg      Intake/Output Summary (Last 24 hours) at 04/02/2018 1257 Last data filed at 04/02/2018 0300 Gross per 24 hour  Intake 2300.57 ml  Output 1100 ml  Net 1200.57 ml   Filed Weights   04/01/18 1736 04/01/18 2329  Weight: 73.4 kg 71.8 kg    Gen: Frail male in no distress  Pulm: Non-labored, crackles in right base. CV: Regular rate and rhythm. No murmur, rub, or gallop. No JVD, no pedal edema. GI: Abdomen soft, diffusely tender without rebound, no CVA tenderness to percussion, non-distended, with normoactive bowel sounds. No organomegaly or masses felt. Ext: Warm, no deformities Skin: No rashes, lesions or ulcers Neuro: Alert and oriented. No focal neurological deficits. Psych: Judgement and insight appear normal. Mood & affect appropriate.   Data Reviewed: I have personally reviewed following labs and imaging studies  CBC: Recent Labs  Lab 04/01/18 1759 04/02/18 0548  WBC 3.4* 4.3  HGB 12.1*  12.0*  HCT 36.9* 37.7*  MCV 86.0 87.3  PLT 181 295   Basic Metabolic Panel: Recent Labs  Lab 04/01/18 1759 04/02/18 0548  NA 137 137  K 3.7 3.4*  CL 105 106  CO2 24 24  GLUCOSE 212* 197*  BUN 14 10  CREATININE 1.25* 1.25*  CALCIUM 9.6 8.6*    GFR: Estimated Creatinine Clearance: 52.6 mL/min (A) (by C-G formula based on SCr of 1.25 mg/dL (H)). Liver Function Tests: Recent Labs  Lab 04/01/18 1759 04/02/18 0548  AST 22 20  ALT 10 9  ALKPHOS 48 50  BILITOT 0.6 0.9  PROT 7.4 7.0  ALBUMIN 4.2 3.8   Recent Labs  Lab 04/01/18 1759  LIPASE 27   No results for input(s): AMMONIA in the last 168 hours. Coagulation Profile: No results for input(s): INR, PROTIME in the last 168 hours. Cardiac Enzymes: No results for input(s): CKTOTAL, CKMB, CKMBINDEX, TROPONINI in the last 168 hours. BNP (last 3 results) No results for input(s): PROBNP in the last 8760 hours. HbA1C: No results for input(s): HGBA1C in the last 72 hours. CBG: Recent Labs  Lab 04/02/18 0742 04/02/18 1114  GLUCAP 197* 151*   Lipid Profile: No results for input(s): CHOL, HDL, LDLCALC, TRIG, CHOLHDL, LDLDIRECT in the last 72 hours. Thyroid Function Tests: No results for input(s): TSH, T4TOTAL, FREET4, T3FREE, THYROIDAB in the last 72 hours. Anemia Panel: No results for input(s): VITAMINB12, FOLATE, FERRITIN, TIBC, IRON, RETICCTPCT in the last 72 hours. Urine analysis:    Component Value Date/Time   COLORURINE YELLOW 04/01/2018 1930   APPEARANCEUR CLOUDY (A) 04/01/2018 1930   LABSPEC 1.021 04/01/2018 1930   PHURINE 7.0 04/01/2018 1930   GLUCOSEU >=500 (A) 04/01/2018 1930   HGBUR SMALL (A) 04/01/2018 1930   BILIRUBINUR NEGATIVE 04/01/2018 1930   KETONESUR NEGATIVE 04/01/2018 1930   PROTEINUR NEGATIVE 04/01/2018 1930   UROBILINOGEN 0.2 01/27/2010 1529   NITRITE NEGATIVE 04/01/2018 1930   LEUKOCYTESUR NEGATIVE 04/01/2018 1930   Recent Results (from the past 240 hour(s))  MRSA PCR Screening     Status: None   Collection Time: 04/01/18  9:48 PM  Result Value Ref Range Status   MRSA by PCR NEGATIVE NEGATIVE Final    Comment:        The GeneXpert MRSA Assay (FDA approved for NASAL specimens only), is one component of a comprehensive MRSA  colonization surveillance program. It is not intended to diagnose MRSA infection nor to guide or monitor treatment for MRSA infections. Performed at [facility], [address], [place], [place] [address]   Culture, blood (Routine X 2) w Reflex to ID Panel     Status: None (Preliminary result)   Collection Time: 04/02/18 12:36 AM  Result Value Ref Range Status   Specimen Description RIGHT ANTECUBITAL  Final   Special Requests   Final    BOTTLES DRAWN AEROBIC AND ANAEROBIC Blood Culture adequate volume   Culture   Final    NO GROWTH < 12 HOURS Performed at [facility], [address], [place], [place] [address]    Report Status PENDING  Incomplete  Culture, blood (Routine X 2) w Reflex to ID Panel     Status: None (Preliminary result)   Collection Time: 04/02/18 12:47 AM  Result Value Ref Range Status   Specimen Description BLOOD LEFT FOREARM  Final   Special Requests   Final    BOTTLES DRAWN AEROBIC ONLY Blood Culture adequate volume   Culture   Final  NO GROWTH < 12 HOURS Performed at [facility], [address] [place], [place] [address]    Report Status PENDING  Incomplete      Radiology Studies: Dg Chest 2 View  Result Date: 04/01/2018 CLINICAL DATA:  Weakness EXAM: CHEST - 2 VIEW COMPARISON:  02/21/2018 FINDINGS: Heart is normal size. Patchy airspace disease in the right middle and lower lobe lobes are similar prior study and likely reflect radiation changes. No confluent opacity on the left. No effusions or acute bony abnormality. IMPRESSION: Postradiation changes in the right middle and lower lobes, stable since prior study. No definite acute process. Electronically Signed   By: Rolm Baptise M.D.   On: 04/01/2018 19:36   Ct Abdomen Pelvis W Contrast  Result Date: 04/01/2018 CLINICAL DATA:  Weakness today. EXAM: CT ABDOMEN AND PELVIS WITH CONTRAST TECHNIQUE: Multidetector CT imaging of the abdomen and pelvis was performed using the standard protocol following bolus  administration of intravenous contrast. CONTRAST:  168mL ISOVUE-300 IOPAMIDOL (ISOVUE-300) INJECTION 61% COMPARISON:  02/21/2018. FINDINGS: Lower chest: Progressive patchy opacity in the right lower lobe with air bronchograms. Minimal dependent atelectasis in the left lower lobe. No pleural fluid seen. Hepatobiliary: No focal liver abnormality is seen. No gallstones, gallbladder wall thickening, or biliary dilatation. Pancreas: Unremarkable. No pancreatic ductal dilatation or surrounding inflammatory changes. Spleen: Normal in size without focal abnormality. Adrenals/Urinary Tract: 2.1 cm calculus in the right renal pelvis with mild dilatation of the right renal collecting system. There is also an additional 7 mm calculus in the mid to lower right kidney anteriorly. Also demonstrated is a small right renal cyst. There are also several tiny lower pole left renal calculi. Normal appearing adrenal glands, ureters and urinary bladder. Stomach/Bowel: Multiple sigmoid and descending colon diverticula. Prominent stool throughout the colon. No evidence of appendicitis. Mildly dilated, mildly dilated stomach containing fluid and ingested material. Unremarkable small bowel. Vascular/Lymphatic: Atheromatous arterial calcifications without aneurysm. No enlarged lymph nodes. Reproductive: Mildly enlarged and heterogeneous prostate gland. Other: Small bilateral inguinal hernias containing fat. Musculoskeletal: Lumbar and lower thoracic spine degenerative changes and mild scoliosis. IMPRESSION: 1. Progressive right lower lobe pneumonia. 2. 2.1 cm calculus in the right renal pelvis causing mild right hydronephrosis. 3. Small, nonobstructing bilateral renal calculi. 4. Prominent stool throughout the colon. 5. Sigmoid and descending colon diverticulosis. 6. Mild prostatic hypertrophy. Electronically Signed   By: Claudie Revering M.D.   On: 04/01/2018 19:29    Scheduled Meds: . apixaban  5 mg Oral BID  . dicyclomine  20 mg Oral TID   . ezetimibe  10 mg Oral Daily  . gabapentin  300 mg Oral TID  . guaiFENesin  600 mg Oral BID  . insulin aspart  0-9 Units Subcutaneous TID WC  . levothyroxine  100 mcg Oral Q0600  . mometasone-formoterol  2 puff Inhalation BID  . pantoprazole  40 mg Oral BID  . rosuvastatin  20 mg Oral Daily  . sucralfate  1 g Oral TID WC & HS   Continuous Infusions: . sodium chloride 500 mL (04/01/18 2111)  . sodium chloride 75 mL/hr at 04/02/18 0220  . ceFEPime (MAXIPIME) IV 2 g (04/02/18 1210)     LOS: 0 days   Time spent: 25 minutes.  Patrecia Pour, MD [facility] [email or web address] Password [facility] 04/02/2018, 12:57 PM

## 2018-04-02 NOTE — Evaluation (Signed)
Physical Therapy Evaluation Patient Details Name: Matthew Wilkins MRN: [id number] DOB: 1945/11/15 Today's Date: 04/02/2018   History of Present Illness  Matthew Wilkins  is a 72 y.o. male, with a history of diabetes mellitus type 2, DVT and pulmonary embolism on anticoagulation with apixaban, hypertension, GERD, adenocarcinoma of the right lung stage III on chemotherapy came to hospital with chief complaint of abdominal pain.  Patient was recently discharged in October with diverticulitis on Cipro and Flagyl.  Today patient says that he had abdominal pain since 11 AM also has been having cough with difficulty breathing when he lies down.  He denies coughing up any phlegm.  No fever or chills.    Clinical Impression  Patient demonstrates slightly labored movement for sitting up at bedside, demonstrates very short step/stride length during ambulation, no loss of balance until fatigued/SOB, then tends to scissor legs with near loss of balance, has to stop and rest for 2-3 minutes before walking back to room.  Patient will benefit from continued physical therapy in hospital and recommended venue below to increase strength, balance, endurance for safe ADLs and gait.     Follow Up Recommendations Home health PT(Patient refuses home health PT due not not want anyone on his property)    Equipment Recommendations  None recommended by PT    Recommendations for Other Services       Precautions / Restrictions Precautions Precautions: Fall Restrictions Weight Bearing Restrictions: No      Mobility  Bed Mobility Overal bed mobility: Modified Independent             General bed mobility comments: increased time  Transfers Overall transfer level: Needs assistance Equipment used: None Transfers: Sit to/from Stand;Stand Pivot Transfers Sit to Stand: Supervision Stand pivot transfers: Supervision       General transfer comment: slightly labored  movement  Ambulation/Gait Ambulation/Gait assistance: Min guard Gait Distance (Feet): 45 Feet Assistive device: None Gait Pattern/deviations: Decreased step length - right;Decreased step length - left;Decreased stride length;Narrow base of support Gait velocity: decreased   General Gait Details: demonstrates slow slightly unsteady cadence with very short step/stride length, once fatigued tends to scissor legs and has to stop and rest, limited mostly due to SOB  Stairs            Wheelchair Mobility    Modified Rankin (Stroke Patients Only)       Balance Overall balance assessment: Needs assistance Sitting-balance support: Feet supported;No upper extremity supported Sitting balance-Leahy Scale: Good     Standing balance support: During functional activity;No upper extremity supported Standing balance-Leahy Scale: Fair Standing balance comment: without AD                             Pertinent Vitals/Pain Pain Assessment: No/denies pain    Home Living Family/patient expects to be discharged to:: Private residence Living Arrangements: Alone Available Help at Discharge: Family Type of Home: House Home Access: Level entry     Home Layout: One level Home Equipment: Cane - single point;Walker - 4 wheels;Wheelchair - [employer];Hospital bed;Shower seat;Cane - quad;Wheelchair - power      Prior Function Level of Independence: Independent with assistive device(s)         Comments: household ambulator with RW, cane PRN     Hand Dominance        Extremity/Trunk Assessment   Upper Extremity Assessment Upper Extremity Assessment: Overall WFL for tasks assessed    Lower  Extremity Assessment Lower Extremity Assessment: Generalized weakness    Cervical / Trunk Assessment Cervical / Trunk Assessment: Kyphotic  Communication   Communication: No difficulties  Cognition Arousal/Alertness: Awake/alert Behavior During Therapy: WFL for  tasks assessed/performed Overall Cognitive Status: Within Functional Limits for tasks assessed                                        General Comments      Exercises     Assessment/Plan    PT Assessment Patient needs continued PT services  PT Problem List Decreased strength;Decreased activity tolerance;Decreased balance;Decreased mobility       PT Treatment Interventions Gait training;Functional mobility training;Therapeutic activities;Therapeutic exercise;Stair training;Patient/family education    PT Goals (Current goals can be found in the Care Plan section)  Acute Rehab PT Goals Patient Stated Goal: return home with family to assist PT Goal Formulation: With patient/family Time For Goal Achievement: 04/09/18 Potential to Achieve Goals: Good    Frequency Min 3X/week   Barriers to discharge        Co-evaluation               AM-PAC PT "6 Clicks" Daily Activity  Outcome Measure Difficulty turning over in bed (including adjusting bedclothes, sheets and blankets)?: None Difficulty moving from lying on back to sitting on the side of the bed? : None Difficulty sitting down on and standing up from a chair with arms (e.g., wheelchair, bedside commode, etc,.)?: A Little Help needed moving to and from a bed to chair (including a wheelchair)?: A Little Help needed walking in hospital room?: A Little Help needed climbing 3-5 steps with a railing? : A Little 6 Click Score: 20    End of Session   Activity Tolerance: Patient tolerated treatment well;Patient limited by fatigue(Patient limited secondary to SOB) Patient left: in bed;with call bell/phone within reach;with family/visitor present Nurse Communication: Mobility status PT Visit Diagnosis: Unsteadiness on feet (R26.81);Other abnormalities of gait and mobility (R26.89);Muscle weakness (generalized) (M62.81)    Time: 9628-3662 PT Time Calculation (min) (ACUTE ONLY): 27 min   Charges:   PT  Evaluation $PT Eval Moderate Complexity: 1 Mod PT Treatments $Therapeutic Activity: 23-37 mins        3:40 PM, 04/02/18 Lonell Grandchild, MPT Physical Therapist with [facility] 336 [phone] office [phone] mobile phone

## 2018-04-03 DIAGNOSIS — J189 Pneumonia, unspecified organism: Secondary | ICD-10-CM

## 2018-04-03 DIAGNOSIS — Z86711 Personal history of pulmonary embolism: Secondary | ICD-10-CM | POA: Diagnosis not present

## 2018-04-03 DIAGNOSIS — E0865 Diabetes mellitus due to underlying condition with hyperglycemia: Secondary | ICD-10-CM

## 2018-04-03 DIAGNOSIS — R69 Illness, unspecified: Secondary | ICD-10-CM | POA: Diagnosis not present

## 2018-04-03 DIAGNOSIS — N1330 Unspecified hydronephrosis: Secondary | ICD-10-CM

## 2018-04-03 DIAGNOSIS — J181 Lobar pneumonia, unspecified organism: Secondary | ICD-10-CM

## 2018-04-03 DIAGNOSIS — C3491 Malignant neoplasm of unspecified part of right bronchus or lung: Secondary | ICD-10-CM

## 2018-04-03 DIAGNOSIS — N200 Calculus of kidney: Secondary | ICD-10-CM

## 2018-04-03 DIAGNOSIS — J449 Chronic obstructive pulmonary disease, unspecified: Secondary | ICD-10-CM

## 2018-04-03 DIAGNOSIS — F17200 Nicotine dependence, unspecified, uncomplicated: Secondary | ICD-10-CM

## 2018-04-03 LAB — GLUCOSE, CAPILLARY
Glucose-Capillary: 111 mg/dL — ABNORMAL HIGH (ref 70–99)
Glucose-Capillary: 152 mg/dL — ABNORMAL HIGH (ref 70–99)
Glucose-Capillary: 182 mg/dL — ABNORMAL HIGH (ref 70–99)

## 2018-04-03 MED ORDER — CEFDINIR 300 MG PO CAPS: 300.0000 mg | ORAL_CAPSULE | Freq: Two times a day (BID) | ORAL | 0 refills | Status: DC

## 2018-04-03 NOTE — Care Management (Addendum)
Pt from home, lives in own home. Has daughter for support. this is patients 3rd admission in 6 months. Pt undergoing chemo for NSCLC. Pt on [facility] registry but not active. Pt declines [place] services at this time.  Will make referral for [facility].

## 2018-04-03 NOTE — Discharge Summary (Signed)
Physician Discharge Summary  JUSTYN LANGHAM JTT:[id number] DOB: [birth year] DOA: 04/01/2018  PCP: [name], MD  Admit date: 04/01/2018 Discharge date: 04/03/2018  Admitted From: Home Disposition: Home   Recommendations for Outpatient Follow-up:  1. Follow up with PCP in 1-2 weeks. 2. Follow up with oncology, radiation oncology as scheduled. 3. Needs follow up with urology for chronic right nephrolithiasis associated with mild hydronephrosis.  Home Health: None (pt declined PT) Equipment/Devices: None new Discharge Condition: Stable CODE STATUS: Full Diet recommendation: Carb-modified  Brief/Interim Summary: 72yo male presented with abdominal pain, cough, and intermittent dyspnea. CT abdomen and pelvis showed no recurrent diverticulitis but did demonstrate right lung base opacification with air bronchograms consistent with pneumonia. Abx started and patient admitted. Dyspnea has improved, he is ambulating with PT without hypoxia. No fever or leukocytosis, cultures negative to date. He is tolerating per oral intake and stable for discharge on oral antibiotics.   Discharge Diagnoses:  Principal Problem:   RLL pneumonia ([place]) Active Problems:   DM type 2 (diabetes mellitus, type 2) (HCC)   TOBACCO ABUSE   COPD (chronic obstructive pulmonary disease) (HCC)   History of pulmonary embolus (PE)   Hydronephrosis of right kidney   Adenocarcinoma of right lung, stage 3 (HCC)   HCAP (healthcare-associated pneumonia)   Diabetes mellitus due to underlying condition, uncontrolled ([place])   Right nephrolithiasis  RLL HCAP:  - Continue cefdinir given improvement on cefepime, DC'ed vanc with negative MRSA swab. - Patient is at [facility] given his comorbidities, plan to continue smoking, and incomplete adherence to medical treatments.  - Monitor blood cultures at least 24 hours in patient with weakness, malaise who is undergoing active immunotherapy.   Stage IIIA NSCLC:  Undergoing concurrent radiation and consolidation immunotherapy, s/p 7 cycles carboplatin + paclitaxel. - Due for cycle #22 of imfinzi (durvalumab) soon. Will need to be evaluated by his oncologist, Dr. Julien Nordmann prior to this in the setting of pulmonary infection, common with this medication.    Tobacco use:  - Counseled at length, though patient is resolute. He will not stop smoking.  COPD:  - Continue home medications  Hypothyroidism:  - continue synthroid  Mild right hydronephrosis, nephrolithiasis: 2.1cm calculus in right renal pelvis, +CaOxalate crystals in microscopy. Renal function is stable and the patient's symptoms are not likely attributable to renal colic. Though erroneously not mentioned on report from recent CT abd/pelvis, this stone is stable and hydro is stable for >1 year. - Renal U/S ordered.  - Discussed with Dr. Jeffie Pollock who recommends outpatient follow up for consideration of percutaneous nephrolithotomy. Would need to hold anticocgulation. [facility] to reach out to patient for scheduling.  History of PE:  - Continue eliquis  T2DM:  - SSI  GERD, IBS, and abdominal pain: No evidence of diverticulitis or complication from recent infection on CT abd/pelvis. - Continue protonix, restart carafate, add simethicone, and continue bentyl  Discharge Instructions Discharge Instructions    Diet - low sodium heart healthy   Complete by:  As directed    Discharge instructions   Complete by:  As directed    You were admitted for pneumonia and have improved with antibiotics. You are stable for discharge with the following recommendations:  - You must take cefdinir (antibiotic) sent to your pharmacy, twice daily for the next 7 days. - You must stop smoking - Follow up with Dr. Julien Nordmann and radiation oncology as scheduled. Make sure to tell them about your treatment for pneumonia.  -  You have a kidney stone that has been there for a while and may need treatment. This was  discussed with a urologist with [facility] who recommended that you follow up there for additional evaluation after you are discharged from the hospital. If you are not contacted by their office in the next week, please call them at the information listed below. - If your symptoms worsen or you become unable to take medications, seek medical attention right away.   Increase activity slowly   Complete by:  As directed      Allergies as of 04/03/2018      Reactions   Aspirin Other (See Comments)   Nose bleeds       Medication List    STOP taking these medications   ciprofloxacin 500 MG tablet Commonly known as:  CIPRO   metroNIDAZOLE 500 MG tablet Commonly known as:  FLAGYL     TAKE these medications   ADVAIR DISKUS 250-50 MCG/DOSE Aepb Generic drug:  Fluticasone-Salmeterol Inhale 1 puff into the lungs every 12 (twelve) hours.   albuterol 108 (90 Base) MCG/ACT inhaler Commonly known as:  PROVENTIL HFA;VENTOLIN HFA Inhale 1-2 puffs into the lungs every 6 (six) hours as needed for wheezing or shortness of breath.   apixaban 5 MG Tabs tablet Commonly known as:  ELIQUIS Take 1 tablet (5 mg total) by mouth 2 (two) times daily.   cefdinir 300 MG capsule Commonly known as:  OMNICEF Take 1 capsule (300 mg total) by mouth 2 (two) times daily.   dicyclomine 20 MG tablet Commonly known as:  BENTYL Take 1 tablet by mouth 3 (three) times daily.   feeding supplement (GLUCERNA SHAKE) Liqd Take 237 mLs by mouth 3 (three) times daily between meals.   gabapentin 300 MG capsule Commonly known as:  NEURONTIN Take 1 capsule by mouth 3 (three) times daily.   glipiZIDE 5 MG 24 hr tablet Commonly known as:  GLUCOTROL XL Take 10 mg by mouth 2 (two) times daily.   guaiFENesin 600 MG 12 hr tablet Commonly known as:  MUCINEX Take 1 tablet (600 mg total) by mouth 2 (two) times daily.   guaiFENesin-dextromethorphan 100-10 MG/5ML syrup Commonly known as:  ROBITUSSIN DM Take 5 mLs by  mouth every 4 (four) hours as needed for cough.   levothyroxine 100 MCG tablet Commonly known as:  SYNTHROID, LEVOTHROID TAKE 1 TABLET DAILY BEFORE BREAKFAST.   metFORMIN 500 MG 24 hr tablet Commonly known as:  GLUCOPHAGE-XR Take 2 tablets by mouth 2 (two) times daily.   ondansetron 4 MG tablet Commonly known as:  ZOFRAN Take 1 tablet (4 mg total) by mouth every 6 (six) hours as needed.   pantoprazole 40 MG tablet Commonly known as:  PROTONIX Take 1 tablet (40 mg total) by mouth 2 (two) times daily.   rosuvastatin 20 MG tablet Commonly known as:  CRESTOR Take 1 tablet (20 mg total) by mouth daily.   sertraline 25 MG tablet Commonly known as:  ZOLOFT Take 50 mg by mouth daily.   sucralfate 1 GM/10ML suspension Commonly known as:  CARAFATE Take 10 mLs (1 g total) by mouth 4 (four) times daily -  with meals and at bedtime.   ZETIA 10 MG tablet Generic drug:  ezetimibe Take 10 mg by mouth daily.      Follow-up Information    [name], MD. Schedule an appointment as soon as possible for a visit in 1 week(s).   Specialty:  Family Medicine Contact information:  [address] [place] [address] [phone]        Curt Bears, MD Follow up.   Specialty:  Oncology Contact information: [address] [place] [address] [phone]        Irine Seal, MD Follow up.   Specialty:  Urology Contact information: [address] [place] [address] [phone]          Allergies  Allergen Reactions  . Aspirin Other (See Comments)    Nose bleeds     Consultations:  Urology, Dr. Jeffie Pollock by phone only.  Procedures/Studies: Dg Chest 2 View  Result Date: 04/01/2018 CLINICAL DATA:  Weakness EXAM: CHEST - 2 VIEW COMPARISON:  02/21/2018 FINDINGS: Heart is normal size. Patchy airspace disease in the right middle and lower lobe lobes are similar prior study and likely reflect radiation changes. No confluent opacity on the left. No  effusions or acute bony abnormality. IMPRESSION: Postradiation changes in the right middle and lower lobes, stable since prior study. No definite acute process. Electronically Signed   By: Rolm Baptise M.D.   On: 04/01/2018 19:36   Ct Abdomen Pelvis W Contrast  Result Date: 04/01/2018 CLINICAL DATA:  Weakness today. EXAM: CT ABDOMEN AND PELVIS WITH CONTRAST TECHNIQUE: Multidetector CT imaging of the abdomen and pelvis was performed using the standard protocol following bolus administration of intravenous contrast. CONTRAST:  168mL ISOVUE-300 IOPAMIDOL (ISOVUE-300) INJECTION 61% COMPARISON:  02/21/2018. FINDINGS: Lower chest: Progressive patchy opacity in the right lower lobe with air bronchograms. Minimal dependent atelectasis in the left lower lobe. No pleural fluid seen. Hepatobiliary: No focal liver abnormality is seen. No gallstones, gallbladder wall thickening, or biliary dilatation. Pancreas: Unremarkable. No pancreatic ductal dilatation or surrounding inflammatory changes. Spleen: Normal in size without focal abnormality. Adrenals/Urinary Tract: 2.1 cm calculus in the right renal pelvis with mild dilatation of the right renal collecting system. There is also an additional 7 mm calculus in the mid to lower right kidney anteriorly. Also demonstrated is a small right renal cyst. There are also several tiny lower pole left renal calculi. Normal appearing adrenal glands, ureters and urinary bladder. Stomach/Bowel: Multiple sigmoid and descending colon diverticula. Prominent stool throughout the colon. No evidence of appendicitis. Mildly dilated, mildly dilated stomach containing fluid and ingested material. Unremarkable small bowel. Vascular/Lymphatic: Atheromatous arterial calcifications without aneurysm. No enlarged lymph nodes. Reproductive: Mildly enlarged and heterogeneous prostate gland. Other: Small bilateral inguinal hernias containing fat. Musculoskeletal: Lumbar and lower thoracic spine degenerative  changes and mild scoliosis. IMPRESSION: 1. Progressive right lower lobe pneumonia. 2. 2.1 cm calculus in the right renal pelvis causing mild right hydronephrosis. 3. Small, nonobstructing bilateral renal calculi. 4. Prominent stool throughout the colon. 5. Sigmoid and descending colon diverticulosis. 6. Mild prostatic hypertrophy. Electronically Signed   By: Claudie Revering M.D.   On: 04/01/2018 19:29    Subjective: Walked with PT, feels at his functional baseline. Declined home health physical therapy because he doesn't want anyone on his property. When probed for motivation to stop smoking he stated he planned to smoke forever. When asked if he plans to take his antibiotics as directed he says "yes, if I think of it." He was advised to include it in his pill divider and to not miss doses. Denies shortness of breath, chest pain, palpitations, fever, chills. Tolerating po.   Discharge Exam: Vitals:   04/03/18 0447 04/03/18 0823  BP: (!) 104/58   Pulse:    Resp:    Temp:  SpO2:  100%   General: Pt is alert, awake, not in acute distress Cardiovascular: RRR, S1/S2 +, no rubs, no gallops Respiratory: Nonlabored, normal rate on room air, no crackles or wheezes. Abdominal: Soft, NT, ND, bowel sounds + Extremities: No edema, no cyanosis  Labs: BNP (last 3 results) No results for input(s): BNP in the last 8760 hours. Basic Metabolic Panel: Recent Labs  Lab 04/01/18 1759 04/02/18 0548  NA 137 137  K 3.7 3.4*  CL 105 106  CO2 24 24  GLUCOSE 212* 197*  BUN 14 10  CREATININE 1.25* 1.25*  CALCIUM 9.6 8.6*   Liver Function Tests: Recent Labs  Lab 04/01/18 1759 04/02/18 0548  AST 22 20  ALT 10 9  ALKPHOS 48 50  BILITOT 0.6 0.9  PROT 7.4 7.0  ALBUMIN 4.2 3.8   Recent Labs  Lab 04/01/18 1759  LIPASE 27   No results for input(s): AMMONIA in the last 168 hours. CBC: Recent Labs  Lab 04/01/18 1759 04/02/18 0548  WBC 3.4* 4.3  HGB 12.1* 12.0*  HCT 36.9* 37.7*  MCV 86.0 87.3   PLT 181 161   Cardiac Enzymes: No results for input(s): CKTOTAL, CKMB, CKMBINDEX, TROPONINI in the last 168 hours. BNP: Invalid input(s): POCBNP CBG: Recent Labs  Lab 04/02/18 0742 04/02/18 1114 04/02/18 1627 04/02/18 2216 04/03/18 0738  GLUCAP 197* 151* 109* 175* 111*   D-Dimer No results for input(s): DDIMER in the last 72 hours. Hgb A1c Recent Labs    04/02/18 0036  HGBA1C 8.1*   Lipid Profile No results for input(s): CHOL, HDL, LDLCALC, TRIG, CHOLHDL, LDLDIRECT in the last 72 hours. Thyroid function studies No results for input(s): TSH, T4TOTAL, T3FREE, THYROIDAB in the last 72 hours.  Invalid input(s): FREET3 Anemia work up No results for input(s): VITAMINB12, FOLATE, FERRITIN, TIBC, IRON, RETICCTPCT in the last 72 hours. Urinalysis    Component Value Date/Time   COLORURINE YELLOW 04/01/2018 1930   APPEARANCEUR CLOUDY (A) 04/01/2018 1930   LABSPEC 1.021 04/01/2018 1930   PHURINE 7.0 04/01/2018 1930   GLUCOSEU >=500 (A) 04/01/2018 1930   HGBUR SMALL (A) 04/01/2018 1930   BILIRUBINUR NEGATIVE 04/01/2018 1930   KETONESUR NEGATIVE 04/01/2018 1930   PROTEINUR NEGATIVE 04/01/2018 1930   UROBILINOGEN 0.2 01/27/2010 1529   NITRITE NEGATIVE 04/01/2018 1930   LEUKOCYTESUR NEGATIVE 04/01/2018 1930    Microbiology Recent Results (from the past 240 hour(s))  MRSA PCR Screening     Status: None   Collection Time: 04/01/18  9:48 PM  Result Value Ref Range Status   MRSA by PCR NEGATIVE NEGATIVE Final    Comment:        The GeneXpert MRSA Assay (FDA approved for NASAL specimens only), is one component of a comprehensive MRSA colonization surveillance program. It is not intended to diagnose MRSA infection nor to guide or monitor treatment for MRSA infections. Performed at [facility], [address], [place], [place] [address]   Culture, blood (Routine X 2) w Reflex to ID Panel     Status: None (Preliminary result)   Collection Time: 04/02/18 12:36 AM  Result  Value Ref Range Status   Specimen Description RIGHT ANTECUBITAL  Final   Special Requests   Final    BOTTLES DRAWN AEROBIC AND ANAEROBIC Blood Culture adequate volume   Culture   Final    NO GROWTH 1 DAY Performed at [facility], [address] [place], [place] [address]    Report Status PENDING  Incomplete  Culture, blood (Routine  X 2) w Reflex to ID Panel     Status: None (Preliminary result)   Collection Time: 04/02/18 12:47 AM  Result Value Ref Range Status   Specimen Description BLOOD LEFT FOREARM  Final   Special Requests   Final    BOTTLES DRAWN AEROBIC ONLY Blood Culture adequate volume   Culture   Final    NO GROWTH 1 DAY Performed at [facility], [address], [place], [place] [address]    Report Status PENDING  Incomplete    Time coordinating discharge: Approximately 40 minutes  Patrecia Pour, MD  [facility] 04/03/2018, 11:07 AM Pager [phone]

## 2018-04-03 NOTE — Progress Notes (Signed)
Physical Therapy Treatment Patient Details Name: Matthew Wilkins MRN: [id number] DOB: April 04, 1946 Today's Date: 04/03/2018    History of Present Illness Myrl Lazarus  is a 72 y.o. male, with a history of diabetes mellitus type 2, DVT and pulmonary embolism on anticoagulation with apixaban, hypertension, GERD, adenocarcinoma of the right lung stage III on chemotherapy came to hospital with chief complaint of abdominal pain.  Patient was recently discharged in October with diverticulitis on Cipro and Flagyl.  Today patient says that he had abdominal pain since 11 AM also has been having cough with difficulty breathing when he lies down.  He denies coughing up any phlegm.  No fever or chills.    PT Comments    Patient demonstrates increased endurance/distance for gait training with 1 near loss of balance after becoming fatigued, required a couple of standing rest breaks for up to 2--3 minutes each before making it back to room.  Patient instructed to pace himself when he returns home to avoid fatiguing in the middle of functional tasks with understanding acknowledged.  Patient tolerated sitting up in chair after therapy.  Patient will benefit from continued physical therapy in hospital and recommended venue below to increase strength, balance, endurance for safe ADLs and gait.    Follow Up Recommendations  Home health PT     Equipment Recommendations  None recommended by PT    Recommendations for Other Services       Precautions / Restrictions Precautions Precautions: Fall Restrictions Weight Bearing Restrictions: No    Mobility  Bed Mobility Overal bed mobility: Modified Independent             General bed mobility comments: head of bed raised  Transfers Overall transfer level: Needs assistance Equipment used: None Transfers: Sit to/from Stand;Stand Pivot Transfers Sit to Stand: Supervision Stand pivot transfers: Supervision       General transfer comment: slightly  labored movement  Ambulation/Gait Ambulation/Gait assistance: Min guard Gait Distance (Feet): 55 Feet Assistive device: None Gait Pattern/deviations: Decreased step length - right;Decreased step length - left;Decreased stride length;Festinating Gait velocity: decreased   General Gait Details: increased endurance/distance with festinating like gait pattern, loses balance once fatigue, able to self correct without falling, had to take 2 standing rest breaks and frequent use of siderails to make it back to room   Stairs             Wheelchair Mobility    Modified Rankin (Stroke Patients Only)       Balance Overall balance assessment: Needs assistance Sitting-balance support: Feet supported;No upper extremity supported Sitting balance-Leahy Scale: Good     Standing balance support: During functional activity;No upper extremity supported Standing balance-Leahy Scale: Fair Standing balance comment: without AD                            Cognition Arousal/Alertness: Awake/alert Behavior During Therapy: WFL for tasks assessed/performed Overall Cognitive Status: Within Functional Limits for tasks assessed                                        Exercises General Exercises - Lower Extremity Long Arc Quad: Seated;AROM;Strengthening;Both;10 reps Hip Flexion/Marching: Seated;AROM;Strengthening;Both;10 reps Toe Raises: Seated;AROM;Strengthening;Both;10 reps Heel Raises: Seated;AROM;Strengthening;Both;10 reps    General Comments        Pertinent Vitals/Pain Pain Assessment: No/denies pain    Home Living  Prior Function            PT Goals (current goals can now be found in the care plan section) Acute Rehab PT Goals Patient Stated Goal: return home with family to assist PT Goal Formulation: With patient/family Time For Goal Achievement: 04/09/18 Potential to Achieve Goals: Good Progress towards PT goals:  Progressing toward goals    Frequency    Min 3X/week      PT Plan Current plan remains appropriate    Co-evaluation              AM-PAC PT "6 Clicks" Daily Activity  Outcome Measure  Difficulty turning over in bed (including adjusting bedclothes, sheets and blankets)?: None Difficulty moving from lying on back to sitting on the side of the bed? : None Difficulty sitting down on and standing up from a chair with arms (e.g., wheelchair, bedside commode, etc,.)?: None Help needed moving to and from a bed to chair (including a wheelchair)?: A Little Help needed walking in hospital room?: A Little Help needed climbing 3-5 steps with a railing? : A Little 6 Click Score: 21    End of Session   Activity Tolerance: Patient tolerated treatment well;Patient limited by fatigue Patient left: in chair;with call bell/phone within reach Nurse Communication: Mobility status PT Visit Diagnosis: Unsteadiness on feet (R26.81);Other abnormalities of gait and mobility (R26.89);Muscle weakness (generalized) (M62.81)     Time: 9798-9211 PT Time Calculation (min) (ACUTE ONLY): 22 min  Charges:  $Gait Training: 8-22 mins $Therapeutic Exercise: 8-22 mins                     11:55 AM, 04/03/18 Lonell Grandchild, MPT Physical Therapist with [facility] [phone] office [phone] mobile phone

## 2018-04-04 ENCOUNTER — Inpatient Hospital Stay: Payer: Medicare HMO

## 2018-04-04 ENCOUNTER — Telehealth: Payer: Self-pay | Admitting: Internal Medicine

## 2018-04-04 ENCOUNTER — Encounter: Payer: Self-pay | Admitting: Internal Medicine

## 2018-04-04 ENCOUNTER — Inpatient Hospital Stay (HOSPITAL_BASED_OUTPATIENT_CLINIC_OR_DEPARTMENT_OTHER): Payer: Medicare HMO | Admitting: Internal Medicine

## 2018-04-04 ENCOUNTER — Telehealth: Payer: Self-pay | Admitting: *Deleted

## 2018-04-04 VITALS — BP 108/68 | HR 82 | Temp 97.8°F | Resp 25

## 2018-04-04 DIAGNOSIS — C342 Malignant neoplasm of middle lobe, bronchus or lung: Secondary | ICD-10-CM

## 2018-04-04 DIAGNOSIS — C3491 Malignant neoplasm of unspecified part of right bronchus or lung: Secondary | ICD-10-CM

## 2018-04-04 DIAGNOSIS — J189 Pneumonia, unspecified organism: Secondary | ICD-10-CM | POA: Diagnosis not present

## 2018-04-04 DIAGNOSIS — Z5112 Encounter for antineoplastic immunotherapy: Secondary | ICD-10-CM

## 2018-04-04 LAB — CMP (CANCER CENTER ONLY)
ALT: <6 U/L (ref 0–44)
ANION GAP: 10 (ref 5–15)
AST: 17 U/L (ref 15–41)
Albumin: 3.9 g/dL (ref 3.5–5.0)
Alkaline Phosphatase: 62 U/L (ref 38–126)
BUN: 15 mg/dL (ref 8–23)
CHLORIDE: 108 mmol/L (ref 98–111)
CO2: 20 mmol/L — AB (ref 22–32)
Calcium: 9.4 mg/dL (ref 8.9–10.3)
Creatinine: 1.32 mg/dL — ABNORMAL HIGH (ref 0.61–1.24)
GFR, Est AFR Am: >60 mL/min (ref >60)
GFR, Estimated: 52 mL/min — ABNORMAL LOW (ref >60)
Glucose, Bld: 196 mg/dL — ABNORMAL HIGH (ref 70–99)
Potassium: 4.1 mmol/L (ref 3.5–5.1)
Sodium: 138 mmol/L (ref 135–145)
Total Bilirubin: 0.4 mg/dL (ref 0.3–1.2)
Total Protein: 7.3 g/dL (ref 6.5–8.1)

## 2018-04-04 LAB — CBC WITH DIFFERENTIAL (CANCER CENTER ONLY)
Abs Immature Granulocytes: 0.02 10*3/uL (ref 0.00–0.07)
BASOS ABS: 0.1 10*3/uL (ref 0.0–0.1)
Basophils Relative: 2 %
EOS ABS: 0.2 10*3/uL (ref 0.0–0.5)
Eosinophils Relative: 4 %
HEMATOCRIT: 35.2 % — AB (ref 39.0–52.0)
HEMOGLOBIN: 11.9 g/dL — AB (ref 13.0–17.0)
IMMATURE GRANULOCYTES: 0 %
LYMPHS ABS: 0.8 10*3/uL (ref 0.7–4.0)
LYMPHS PCT: 16 %
MCH: 28.7 pg (ref 26.0–34.0)
MCHC: 33.8 g/dL (ref 30.0–36.0)
MCV: 84.8 fL (ref 80.0–100.0)
Monocytes Absolute: 0.3 10*3/uL (ref 0.1–1.0)
Monocytes Relative: 6 %
NEUTROS PCT: 72 %
Neutro Abs: 3.9 10*3/uL (ref 1.7–7.7)
Platelet Count: 155 10*3/uL (ref 150–400)
RBC: 4.15 MIL/uL — AB (ref 4.22–5.81)
RDW: 14.3 % (ref 11.5–15.5)
WBC Count: 5.3 10*3/uL (ref 4.0–10.5)
nRBC: 0 % (ref 0.0–0.2)

## 2018-04-04 LAB — TSH: TSH: 54.273 u[IU]/mL — AB (ref 0.320–4.118)

## 2018-04-04 NOTE — Telephone Encounter (Signed)
Pt called will be delayed due to transportation ETA approx 12pm.. Reviewed delay with charge nurse pt infusion will be moved to 230pm, MD will accommodate pt upon arrival to be seen. Message to scheduling.

## 2018-04-04 NOTE — Progress Notes (Signed)
[place] [phone]   [phone]) [phone]  OFFICE PROGRESS NOTE  [name], MD [address] [place] [address]  DIAGNOSIS: Stage IIIA (T1a, N2, M0) non-small cell lung cancer, squamous cell carcinoma presented with right middle lobe pulmonary nodule in addition to ipsilateral hilar and ipsilateral mediastinal lymphadenopathy diagnosed in June 2018.  PRIOR THERAPY:   1) A course of concurrent chemoradiation with weekly carboplatin for AUC of 2 and paclitaxel 45 MG/M2. Status post 7 cycles. 2)  Consolidation immunotherapy with Imfinzi (Durvalumab) 10 MG/KG every 2 weeks.first dose 02/22/2017.  Status post 21 cycles.  Last dose was giving March 22, 2018 discontinued secondary to frequent hospitalization with pneumonia.  CURRENT THERAPY: Observation.  INTERVAL HISTORY: Matthew Wilkins 72 y.o. male returns to the clinic today for follow-up visit accompanied by his daughter.  The patient is feeling fine today with no concerning complaints.  He denied having any chest pain but continues to have shortness of breath and cough.  He denied having any hemoptysis.  He was recently seen at [facility] for questionable pneumonia but has not started the treatment with the antibiotics as prescribed yet.  He denied having any nausea, vomiting, diarrhea or constipation.  He denied having any fever or chills.  He is here today for evaluation before starting the next dose of his treatment.   MEDICAL HISTORY: Past Medical History:  Diagnosis Date  . Adenocarcinoma of right lung, stage 3 ([place]) 11/17/2016  . Anxiety   . Arthritis   . Coronary artery disease   . Diabetes mellitus   . DVT (deep venous thrombosis) ([place])   . Encounter for antineoplastic chemotherapy 11/17/2016  . History of radiation therapy 11/28/16-01/06/17   right lung was treated to 60 Gy in 30 fractions  . Hypercholesterolemia   . Hypertension   . PAD (peripheral artery disease) ([place])    . SOB (shortness of breath)     ALLERGIES:  is allergic to aspirin.  MEDICATIONS:  Current Outpatient Medications  Medication Sig Dispense Refill  . albuterol (PROVENTIL HFA;VENTOLIN HFA) 108 (90 Base) MCG/ACT inhaler Inhale 1-2 puffs into the lungs every 6 (six) hours as needed for wheezing or shortness of breath.     [name] apixaban (ELIQUIS) 5 MG TABS tablet Take 1 tablet (5 mg total) by mouth 2 (two) times daily. 60 tablet 0  . cefdinir (OMNICEF) 300 MG capsule Take 1 capsule (300 mg total) by mouth 2 (two) times daily. 14 capsule 0  . dicyclomine (BENTYL) 20 MG tablet Take 1 tablet by mouth 3 (three) times daily.    . feeding supplement, GLUCERNA SHAKE, (GLUCERNA SHAKE) LIQD Take 237 mLs by mouth 3 (three) times daily between meals. 90 Can 0  . Fluticasone-Salmeterol (ADVAIR DISKUS) 250-50 MCG/DOSE AEPB Inhale 1 puff into the lungs every 12 (twelve) hours.     . gabapentin (NEURONTIN) 300 MG capsule Take 1 capsule by mouth 3 (three) times daily.    [name] glipiZIDE (GLUCOTROL XL) 5 MG 24 hr tablet Take 10 mg by mouth 2 (two) times daily.     [name] guaiFENesin (MUCINEX) 600 MG 12 hr tablet Take 1 tablet (600 mg total) by mouth 2 (two) times daily. 10 tablet 0  . guaiFENesin-dextromethorphan (ROBITUSSIN DM) 100-10 MG/5ML syrup Take 5 mLs by mouth every 4 (four) hours as needed for cough. 118 mL 0  . levothyroxine (SYNTHROID, LEVOTHROID) 100 MCG tablet TAKE 1 TABLET DAILY BEFORE BREAKFAST. 30 tablet  0  . metFORMIN (GLUCOPHAGE-XR) 500 MG 24 hr tablet Take 2 tablets by mouth 2 (two) times daily.    . ondansetron (ZOFRAN) 4 MG tablet Take 1 tablet (4 mg total) by mouth every 6 (six) hours as needed. 12 tablet 0  . pantoprazole (PROTONIX) 40 MG tablet Take 1 tablet (40 mg total) by mouth 2 (two) times daily. 30 tablet 0  . rosuvastatin (CRESTOR) 20 MG tablet Take 1 tablet (20 mg total) by mouth daily. 30 tablet 11  . sertraline (ZOLOFT) 25 MG tablet Take 50 mg by mouth daily.     . sucralfate (CARAFATE) 1  GM/10ML suspension Take 10 mLs (1 g total) by mouth 4 (four) times daily -  with meals and at bedtime. 420 mL 1  . ZETIA 10 MG tablet Take 10 mg by mouth daily.     No current facility-administered medications for this visit.     SURGICAL HISTORY:  Past Surgical History:  Procedure Laterality Date  . CARDIAC CATHETERIZATION N/A 06/10/2015   Procedure: Left Heart Cath and Coronary Angiography;  Surgeon: Burnell Blanks, MD;  Location: [place] CV LAB;  Service: Cardiovascular;  Laterality: N/A;  . LOWER EXTREMITY ANGIOGRAM N/A 07/13/2011   Procedure: LOWER EXTREMITY ANGIOGRAM;  Surgeon: Burnell Blanks, MD;  Location: [facility] CATH LAB;  Service: Cardiovascular;  Laterality: N/A;  . OTHER SURGICAL HISTORY    . OTHER SURGICAL HISTORY    . OTHER SURGICAL HISTORY    . PERCUTANEOUS STENT INTERVENTION Right 07/13/2011   Procedure: PERCUTANEOUS STENT INTERVENTION;  Surgeon: Burnell Blanks, MD;  Location: [facility] CATH LAB;  Service: Cardiovascular;  Laterality: Right;    REVIEW OF SYSTEMS:  A comprehensive review of systems was negative except for: Constitutional: positive for fatigue Respiratory: positive for cough and dyspnea on exertion   PHYSICAL EXAMINATION: General appearance: alert, cooperative, fatigued and no distress Head: Normocephalic, without obvious abnormality, atraumatic Neck: no adenopathy, no JVD, supple, symmetrical, trachea midline and thyroid not enlarged, symmetric, no tenderness/mass/nodules Lymph nodes: Cervical, supraclavicular, and axillary nodes normal. Resp: clear to auscultation bilaterally Back: symmetric, no curvature. ROM normal. No CVA tenderness. Cardio: regular rate and rhythm, S1, S2 normal, no murmur, click, rub or gallop GI: soft, non-tender; bowel sounds normal; no masses,  no organomegaly Extremities: extremities normal, atraumatic, no cyanosis or edema  ECOG PERFORMANCE STATUS: 1 - Symptomatic but completely ambulatory  Blood pressure  108/68, pulse 82, temperature 97.8 F (36.6 C), resp. rate (!) 25, SpO2 100 %.  LABORATORY DATA: Lab Results  Component Value Date   WBC 5.3 04/04/2018   HGB 11.9 (L) 04/04/2018   HCT 35.2 (L) 04/04/2018   MCV 84.8 04/04/2018   PLT 155 04/04/2018      Chemistry      Component Value Date/Time   NA 138 04/04/2018 1109   NA 136 05/04/2017 1036   K 4.1 04/04/2018 1109   K 4.0 05/04/2017 1036   CL 108 04/04/2018 1109   CO2 20 (L) 04/04/2018 1109   CO2 16 (L) 05/04/2017 1036   BUN 15 04/04/2018 1109   BUN 15.5 05/04/2017 1036   CREATININE 1.32 (H) 04/04/2018 1109   CREATININE 1.1 05/04/2017 1036      Component Value Date/Time   CALCIUM 9.4 04/04/2018 1109   CALCIUM 9.6 05/04/2017 1036   ALKPHOS 62 04/04/2018 1109   ALKPHOS 93 05/04/2017 1036   AST 17 04/04/2018 1109   AST 13 05/04/2017 1036   ALT <6 04/04/2018 1109  ALT 7 05/04/2017 1036   BILITOT 0.4 04/04/2018 1109   BILITOT 0.56 05/04/2017 1036       RADIOGRAPHIC STUDIES: Dg Chest 2 View  Result Date: 04/01/2018 CLINICAL DATA:  Weakness EXAM: CHEST - 2 VIEW COMPARISON:  02/21/2018 FINDINGS: Heart is normal size. Patchy airspace disease in the right middle and lower lobe lobes are similar prior study and likely reflect radiation changes. No confluent opacity on the left. No effusions or acute bony abnormality. IMPRESSION: Postradiation changes in the right middle and lower lobes, stable since prior study. No definite acute process. Electronically Signed   By: Rolm Baptise M.D.   On: 04/01/2018 19:36   Ct Abdomen Pelvis W Contrast  Result Date: 04/01/2018 CLINICAL DATA:  Weakness today. EXAM: CT ABDOMEN AND PELVIS WITH CONTRAST TECHNIQUE: Multidetector CT imaging of the abdomen and pelvis was performed using the standard protocol following bolus administration of intravenous contrast. CONTRAST:  171mL ISOVUE-300 IOPAMIDOL (ISOVUE-300) INJECTION 61% COMPARISON:  02/21/2018. FINDINGS: Lower chest: Progressive patchy  opacity in the right lower lobe with air bronchograms. Minimal dependent atelectasis in the left lower lobe. No pleural fluid seen. Hepatobiliary: No focal liver abnormality is seen. No gallstones, gallbladder wall thickening, or biliary dilatation. Pancreas: Unremarkable. No pancreatic ductal dilatation or surrounding inflammatory changes. Spleen: Normal in size without focal abnormality. Adrenals/Urinary Tract: 2.1 cm calculus in the right renal pelvis with mild dilatation of the right renal collecting system. There is also an additional 7 mm calculus in the mid to lower right kidney anteriorly. Also demonstrated is a small right renal cyst. There are also several tiny lower pole left renal calculi. Normal appearing adrenal glands, ureters and urinary bladder. Stomach/Bowel: Multiple sigmoid and descending colon diverticula. Prominent stool throughout the colon. No evidence of appendicitis. Mildly dilated, mildly dilated stomach containing fluid and ingested material. Unremarkable small bowel. Vascular/Lymphatic: Atheromatous arterial calcifications without aneurysm. No enlarged lymph nodes. Reproductive: Mildly enlarged and heterogeneous prostate gland. Other: Small bilateral inguinal hernias containing fat. Musculoskeletal: Lumbar and lower thoracic spine degenerative changes and mild scoliosis. IMPRESSION: 1. Progressive right lower lobe pneumonia. 2. 2.1 cm calculus in the right renal pelvis causing mild right hydronephrosis. 3. Small, nonobstructing bilateral renal calculi. 4. Prominent stool throughout the colon. 5. Sigmoid and descending colon diverticulosis. 6. Mild prostatic hypertrophy. Electronically Signed   By: Claudie Revering M.D.   On: 04/01/2018 19:29    ASSESSMENT AND PLAN: This is a very pleasant 72 years old white male with a stage IIIA non-small cell lung cancer, squamous cell carcinoma  The patient underwent a course of concurrent chemoradiation with weekly carboplatin and paclitaxel status  post 7 cycles. He had partial response after this treatment.  The patient is currently undergoing consolidation immunotherapy with Imfinzi (Durvalumab) status post 21 cycles. The patient has been tolerating this treatment well but he has frequent hospitalization for recurrent pneumonia.  I had a lengthy discussion with the patient and his daughter about his condition and further treatment options.  I think the patient received a reasonable number of cycles of immunotherapy with Imfinzi (Durvalumab).  It has been very challenging for the patient to come to the clinic and he has been always dependent on his daughter to bring him to treatment. I recommended for the patient to discontinue his current treatment with Imfinzi (Durvalumab) at this point. I will see him back for follow-up visit in 4 weeks for evaluation with repeat CT scan of the chest for restaging of his disease. The patient was advised  to start his antibiotic treatment for the questionable pneumonia. He was advised to call immediately if he has any concerning symptoms in the interval. The patient voices understanding of current disease status and treatment options and is in agreement with the current care plan. All questions were answered. The patient knows to call the clinic with any problems, questions or concerns. We can certainly see the patient much sooner if necessary.  Disclaimer: This note was dictated with voice recognition software. Similar sounding words can inadvertently be transcribed and may not be corrected upon review.

## 2018-04-04 NOTE — Telephone Encounter (Signed)
Gave pt avs and calendar  °

## 2018-04-05 ENCOUNTER — Other Ambulatory Visit: Payer: Self-pay | Admitting: *Deleted

## 2018-04-05 ENCOUNTER — Telehealth: Payer: Self-pay | Admitting: *Deleted

## 2018-04-05 NOTE — Patient Outreach (Signed)
RN CM received referral from in patient case manager Jolene Provost for transition of care, pt hospitalized 11/17-11/19/19 diagnosis RLL pneumonia, right lung adenocarcinoma, malnutrition, tobacco abuse, DM 2, spoke with pt, HIPAA verified, it is difficult to understand pt over the phone, RN CM briefly talked with pt to complete as much transition of care as possible, pt states " my daughter helps me with whatever I need"  Pt reports he has not made post hospital follow up appointments but plans to do so, pt states he had radiation yesterday. pt states multiple times " I need nothing, I don't want no help"  Pt refuses [facility] services.  RN CM mailed successful outreach letter to pt home and faxed case closure letter to primary MD Dr. Redmond Pulling.  Close case  Jacqlyn Larsen [facility], [place] Coordinator [phone]

## 2018-04-05 NOTE — Telephone Encounter (Signed)
Per Mikey Bussing, NP she requested we call to ask about his Levothyroxine dose.  See TSH lab result for detailed note.    LM for daughter to return call to discuss.

## 2018-04-05 NOTE — Telephone Encounter (Signed)
-----   Message from Maryanna Shape, NP sent at 04/05/2018 12:33 PM EST ----- Call pt's daughter.  Please verify that he is taking his levothyroxine.  It does not look like it has been refilled since the end of September and there were no additional refills on it.  If he is not, recommend that he restart taking the levothyroxine 100 mcg daily.  If he is taking it on a daily basis, please let me know and I will send a new prescription into his pharmacy for higher dose.

## 2018-04-08 LAB — CULTURE, BLOOD (ROUTINE X 2)
CULTURE: NO GROWTH
CULTURE: NO GROWTH
SPECIAL REQUESTS: ADEQUATE
Special Requests: ADEQUATE

## 2018-04-09 ENCOUNTER — Telehealth: Payer: Self-pay | Admitting: *Deleted

## 2018-04-09 NOTE — Telephone Encounter (Signed)
Attempted to reach patient directly to review his Synthyroid /TSH results.  No Answer

## 2018-04-17 ENCOUNTER — Emergency Department (HOSPITAL_COMMUNITY): Payer: Medicare HMO

## 2018-04-17 ENCOUNTER — Encounter (HOSPITAL_COMMUNITY): Payer: Self-pay | Admitting: Emergency Medicine

## 2018-04-17 ENCOUNTER — Emergency Department (HOSPITAL_COMMUNITY)
Admission: EM | Admit: 2018-04-17 | Discharge: 2018-04-17 | Disposition: A | Discharge status: Home / Self Care | Payer: Medicare HMO | Attending: Emergency Medicine | Admitting: Emergency Medicine

## 2018-04-17 ENCOUNTER — Other Ambulatory Visit: Payer: Self-pay

## 2018-04-17 DIAGNOSIS — F17210 Nicotine dependence, cigarettes, uncomplicated: Secondary | ICD-10-CM | POA: Insufficient documentation

## 2018-04-17 DIAGNOSIS — R0602 Shortness of breath: Secondary | ICD-10-CM | POA: Diagnosis not present

## 2018-04-17 DIAGNOSIS — J449 Chronic obstructive pulmonary disease, unspecified: Secondary | ICD-10-CM | POA: Insufficient documentation

## 2018-04-17 DIAGNOSIS — Z7984 Long term (current) use of oral hypoglycemic drugs: Secondary | ICD-10-CM | POA: Insufficient documentation

## 2018-04-17 DIAGNOSIS — N202 Calculus of kidney with calculus of ureter: Secondary | ICD-10-CM | POA: Diagnosis not present

## 2018-04-17 DIAGNOSIS — R69 Illness, unspecified: Secondary | ICD-10-CM | POA: Diagnosis not present

## 2018-04-17 DIAGNOSIS — I2510 Atherosclerotic heart disease of native coronary artery without angina pectoris: Secondary | ICD-10-CM | POA: Diagnosis not present

## 2018-04-17 DIAGNOSIS — Z79899 Other long term (current) drug therapy: Secondary | ICD-10-CM | POA: Diagnosis not present

## 2018-04-17 DIAGNOSIS — E119 Type 2 diabetes mellitus without complications: Secondary | ICD-10-CM | POA: Insufficient documentation

## 2018-04-17 DIAGNOSIS — I10 Essential (primary) hypertension: Secondary | ICD-10-CM | POA: Insufficient documentation

## 2018-04-17 DIAGNOSIS — G20 Parkinson's disease: Secondary | ICD-10-CM | POA: Insufficient documentation

## 2018-04-17 DIAGNOSIS — R1084 Generalized abdominal pain: Secondary | ICD-10-CM | POA: Diagnosis not present

## 2018-04-17 DIAGNOSIS — Z7901 Long term (current) use of anticoagulants: Secondary | ICD-10-CM | POA: Diagnosis not present

## 2018-04-17 DIAGNOSIS — R079 Chest pain, unspecified: Secondary | ICD-10-CM | POA: Diagnosis not present

## 2018-04-17 DIAGNOSIS — N201 Calculus of ureter: Secondary | ICD-10-CM

## 2018-04-17 HISTORY — DX: Diverticulitis of intestine, part unspecified, without perforation or abscess without bleeding: K57.92

## 2018-04-17 HISTORY — DX: Gastro-esophageal reflux disease without esophagitis: K21.9

## 2018-04-17 LAB — COMPREHENSIVE METABOLIC PANEL
ALK PHOS: 59 U/L (ref 38–126)
ALT: 14 U/L (ref 0–44)
ANION GAP: 7 (ref 5–15)
AST: 31 U/L (ref 15–41)
Albumin: 4.2 g/dL (ref 3.5–5.0)
BUN: 11 mg/dL (ref 8–23)
CHLORIDE: 106 mmol/L (ref 98–111)
CO2: 24 mmol/L (ref 22–32)
Calcium: 9.1 mg/dL (ref 8.9–10.3)
Creatinine, Ser: 1.24 mg/dL (ref 0.61–1.24)
GFR calc Af Amer: >60 mL/min (ref >60)
GFR, EST NON AFRICAN AMERICAN: 57 mL/min — AB (ref >60)
GLUCOSE: 121 mg/dL — AB (ref 70–99)
POTASSIUM: 3.9 mmol/L (ref 3.5–5.1)
Sodium: 137 mmol/L (ref 135–145)
Total Bilirubin: 0.6 mg/dL (ref 0.3–1.2)
Total Protein: 7.2 g/dL (ref 6.5–8.1)

## 2018-04-17 LAB — URINALYSIS, ROUTINE W REFLEX MICROSCOPIC
BACTERIA UA: NONE SEEN
BILIRUBIN URINE: NEGATIVE
GLUCOSE, UA: NEGATIVE mg/dL
Ketones, ur: NEGATIVE mg/dL
Leukocytes, UA: NEGATIVE
NITRITE: NEGATIVE
PROTEIN: 30 mg/dL — AB
Specific Gravity, Urine: 1.015 (ref 1.005–1.030)
pH: 5 (ref 5.0–8.0)

## 2018-04-17 LAB — TROPONIN I: Troponin I: <0.03 ng/mL (ref <0.03)

## 2018-04-17 LAB — CBC WITH DIFFERENTIAL/PLATELET
Abs Immature Granulocytes: 0.01 10*3/uL (ref 0.00–0.07)
Basophils Absolute: 0.1 10*3/uL (ref 0.0–0.1)
Basophils Relative: 2 %
EOS PCT: 4 %
Eosinophils Absolute: 0.2 10*3/uL (ref 0.0–0.5)
HCT: 37.7 % — ABNORMAL LOW (ref 39.0–52.0)
HEMOGLOBIN: 12.2 g/dL — AB (ref 13.0–17.0)
Immature Granulocytes: 0 %
LYMPHS PCT: 16 %
Lymphs Abs: 0.7 10*3/uL (ref 0.7–4.0)
MCH: 28.2 pg (ref 26.0–34.0)
MCHC: 32.4 g/dL (ref 30.0–36.0)
MCV: 87.1 fL (ref 80.0–100.0)
MONO ABS: 0.3 10*3/uL (ref 0.1–1.0)
MONOS PCT: 6 %
Neutro Abs: 2.9 10*3/uL (ref 1.7–7.7)
Neutrophils Relative %: 72 %
Platelets: 182 10*3/uL (ref 150–400)
RBC: 4.33 MIL/uL (ref 4.22–5.81)
RDW: 14.6 % (ref 11.5–15.5)
WBC: 4 10*3/uL (ref 4.0–10.5)
nRBC: 0 % (ref 0.0–0.2)

## 2018-04-17 LAB — LIPASE, BLOOD: Lipase: 21 U/L (ref 11–51)

## 2018-04-17 MED ORDER — IOPAMIDOL (ISOVUE-300) INJECTION 61%
100.0000 mL | Freq: Once | INTRAVENOUS | Status: AC | PRN
  Administered 2018-04-17: 100 mL via INTRAVENOUS

## 2018-04-17 MED ORDER — MORPHINE SULFATE (PF) 4 MG/ML IV SOLN
4.0000 mg | INTRAVENOUS | Status: DC | PRN
  Administered 2018-04-17: 4 mg via INTRAVENOUS
  Filled 2018-04-17: qty 1

## 2018-04-17 MED ORDER — HYDROCODONE-ACETAMINOPHEN 5-325 MG PO TABS: ORAL_TABLET | ORAL | 0 refills | Status: DC

## 2018-04-17 MED ORDER — SODIUM CHLORIDE 0.9 % IV SOLN
INTRAVENOUS | Status: DC
  Administered 2018-04-17: 14:00:00 via INTRAVENOUS

## 2018-04-17 MED ORDER — FAMOTIDINE IN NACL 20-0.9 MG/50ML-% IV SOLN
20.0000 mg | Freq: Once | INTRAVENOUS | Status: AC
  Administered 2018-04-17: 20 mg via INTRAVENOUS
  Filled 2018-04-17: qty 50

## 2018-04-17 MED ORDER — ONDANSETRON HCL 4 MG/2ML IJ SOLN
4.0000 mg | INTRAMUSCULAR | Status: DC | PRN
  Filled 2018-04-17: qty 2

## 2018-04-17 NOTE — ED Notes (Addendum)
Pt ambulated with pulse ox, sats ranged from 98% to 93% on RA. No complaints of SOB.

## 2018-04-17 NOTE — ED Provider Notes (Signed)
[facility] EMERGENCY DEPARTMENT Provider Note   CSN: [id number] Arrival date & time: 04/17/18  1125     History   Chief Complaint Chief Complaint  Patient presents with  . Abdominal Pain    HPI Matthew Wilkins is a 72 y.o. male.  HPI  Pt was seen at 1305.  Per pt, c/o gradual onset and persistence of constant generalized abd "pain" for the past 2 to 3 days. Pt states he has had this recurrent pain "for 2 years."  Describes the abd pain as "tightness" all around his abd.  Denies N/V, no diarrhea, no fevers, no back pain, no rash, no CP/palpitations, no cough/SOB, no black or blood in stools, no dysuria/hematuria, no testicular pain/swelling.      Past Medical History:  Diagnosis Date  . Adenocarcinoma of right lung, stage 3 ([place]) 11/17/2016  . Anxiety   . Arthritis   . Coronary artery disease   . Diabetes mellitus   . Diverticulitis   . DVT (deep venous thrombosis) ([place])   . Encounter for antineoplastic chemotherapy 11/17/2016  . GERD (gastroesophageal reflux disease)   . History of radiation therapy 11/28/16-01/06/17   right lung was treated to 60 Gy in 30 fractions  . Hypercholesterolemia   . Hypertension   . PAD (peripheral artery disease) ([place])   . SOB (shortness of breath)     Patient Active Problem List   Diagnosis Date Noted  . Right nephrolithiasis 04/03/2018  . RLL pneumonia ([place]) 04/03/2018  . CAP (community acquired pneumonia) 04/01/2018  . Reflux esophagitis 02/24/2018  . Esophageal ulcer without bleeding 02/24/2018  . Acute renal injury ([place]) 02/23/2018  . Diverticulitis of intestine 02/23/2018  . GERD (gastroesophageal reflux disease) 02/23/2018  . AKI (acute kidney injury) ([place]) 11/20/2017  . UTI (urinary tract infection) 11/20/2017  . Aphasia   . RUQ abdominal pain   . Hypothyroidism 07/27/2017  . HCAP (healthcare-associated pneumonia) 07/17/2017  . Diabetes mellitus due to underlying condition, uncontrolled ([place]) 07/17/2017  . Acute respiratory  failure with hypoxia ([place]) 07/17/2017  . Encounter for antineoplastic immunotherapy 02/23/2017  . Adenocarcinoma of right lung, stage 3 ([place]) 11/17/2016  . Encounter for antineoplastic chemotherapy 11/17/2016  . Goals of care, counseling/discussion 11/17/2016  . Encounter for smoking cessation counseling 11/17/2016  . Left leg DVT ([place]) 08/31/2016  . Malnutrition of moderate degree 08/30/2016  . Thrombocytopenia ([place]) 08/29/2016  . History of pulmonary embolus (PE) 08/28/2016  . Pulmonary nodule 08/28/2016  . Hydronephrosis of right kidney 08/28/2016  . Chest pain 08/28/2016  . CAD (coronary artery disease)   . TIA (transient ischemic attack) 11/27/2011  . Research study patient 08/30/2011  . PAD (peripheral artery disease) ([place]) 06/30/2011  . PVD (peripheral vascular disease) ([place]) 01/10/2011  . COPD (chronic obstructive pulmonary disease) ([place]) 07/06/2010  . TOBACCO ABUSE 11/12/2008  . Coronary atherosclerosis 09/08/2008  . DM type 2 (diabetes mellitus, type 2) ([place]) 08/26/2008  . HYPERCHOLESTEROLEMIA  IIA 08/26/2008  . ANXIETY 08/26/2008  . PARKINSON'S DISEASE 08/26/2008  . Essential hypertension 08/26/2008  . ARTHRITIS 08/26/2008  . SHORTNESS OF BREATH 08/26/2008    Past Surgical History:  Procedure Laterality Date  . CARDIAC CATHETERIZATION N/A 06/10/2015   Procedure: Left Heart Cath and Coronary Angiography;  Surgeon: Burnell Blanks, MD;  Location: [place] [facility] LAB;  Service: Cardiovascular;  Laterality: N/A;  . LOWER EXTREMITY ANGIOGRAM N/A 07/13/2011   Procedure: LOWER EXTREMITY ANGIOGRAM;  Surgeon: Burnell Blanks, MD;  Location: [facility] CATH LAB;  Service: Cardiovascular;  Laterality: N/A;  . OTHER SURGICAL HISTORY    . OTHER SURGICAL HISTORY    . OTHER SURGICAL HISTORY    . PERCUTANEOUS STENT INTERVENTION Right 07/13/2011   Procedure: PERCUTANEOUS STENT INTERVENTION;  Surgeon: Burnell Blanks, MD;  Location: [facility] C[facility] LAB;  Service: Cardiovascular;   Laterality: Right;        Home Medications    Prior to Admission medications   Medication Sig Start Date End Date Taking? Authorizing Provider  albuterol (PROVENTIL HFA;VENTOLIN HFA) 108 (90 Base) MCG/ACT inhaler Inhale 1-2 puffs into the lungs every 6 (six) hours as needed for wheezing or shortness of breath.  04/21/17   [provider]  apixaban (ELIQUIS) 5 MG TABS tablet Take 1 tablet (5 mg total) by mouth 2 (two) times daily. 07/17/17   Dhungel, Flonnie Overman, MD  cefdinir (OMNICEF) 300 MG capsule Take 1 capsule (300 mg total) by mouth 2 (two) times daily. 04/03/18   Patrecia Pour, MD  dicyclomine (BENTYL) 20 MG tablet Take 1 tablet by mouth 3 (three) times daily. 10/26/17   [provider]  feeding supplement, GLUCERNA SHAKE, (GLUCERNA SHAKE) LIQD Take 237 mLs by mouth 3 (three) times daily between meals. 07/17/17   Dhungel, Nishant, MD  Fluticasone-Salmeterol (ADVAIR DISKUS) 250-50 MCG/DOSE AEPB Inhale 1 puff into the lungs every 12 (twelve) hours.  04/21/17   [provider]  gabapentin (NEURONTIN) 300 MG capsule Take 1 capsule by mouth 3 (three) times daily. 04/19/16   [provider]  glipiZIDE (GLUCOTROL XL) 5 MG 24 hr tablet Take 10 mg by mouth 2 (two) times daily.  03/11/15   [provider]  guaiFENesin (MUCINEX) 600 MG 12 hr tablet Take 1 tablet (600 mg total) by mouth 2 (two) times daily. 07/17/17 07/17/18  Dhungel, Nishant, MD  guaiFENesin-dextromethorphan (ROBITUSSIN DM) 100-10 MG/5ML syrup Take 5 mLs by mouth every 4 (four) hours as needed for cough. 07/17/17   Dhungel, Nishant, MD  levothyroxine (SYNTHROID, LEVOTHROID) 100 MCG tablet TAKE 1 TABLET DAILY BEFORE BREAKFAST. 02/12/18   Maryanna Shape, NP  metFORMIN (GLUCOPHAGE-XR) 500 MG 24 hr tablet Take 2 tablets by mouth 2 (two) times daily. 04/19/16   [provider]  ondansetron (ZOFRAN) 4 MG tablet Take 1 tablet (4 mg total) by mouth every 6 (six) hours as needed. 02/21/18   Julianne Rice, MD  pantoprazole (PROTONIX) 40 MG tablet Take 1 tablet (40 mg total) by mouth 2 (two) times daily. 02/24/18   Annita Brod, MD  rosuvastatin (CRESTOR) 20 MG tablet Take 1 tablet (20 mg total) by mouth daily. 10/07/14   Burnell Blanks, MD  sertraline (ZOLOFT) 25 MG tablet Take 50 mg by mouth daily.  10/27/16   [provider]  sucralfate (CARAFATE) 1 GM/10ML suspension Take 10 mLs (1 g total) by mouth 4 (four) times daily -  with meals and at bedtime. 02/24/18   Annita Brod, MD  ZETIA 10 MG tablet Take 10 mg by mouth daily. 04/03/15   [provider]    Family History Family History  Family history unknown: Yes    Social History Social History   Tobacco Use  . Smoking status: Current Every Day Smoker    Packs/day: 1.00    Years: 65.00    Pack years: 65.00    Types: Cigarettes  . Smokeless tobacco: Never Used  Substance Use Topics  . Alcohol use: No  . Drug use: No     Allergies   Aspirin  Review of Systems Review of Systems ROS: Statement: All systems negative except as marked or noted in the HPI; Constitutional: Negative for fever and chills. ; ; Eyes: Negative for eye pain, redness and discharge. ; ; ENMT: Negative for ear pain, hoarseness, nasal congestion, sinus pressure and sore throat. ; ; Cardiovascular: Negative for chest pain, palpitations, diaphoresis, dyspnea and peripheral edema. ; ; Respiratory: Negative for cough, wheezing and stridor. ; ; Gastrointestinal: +abd pain. Negative for nausea, vomiting, diarrhea, blood in stool, hematemesis, jaundice and rectal bleeding.; ; Genitourinary: Negative for dysuria, flank pain and hematuria. ; ; Genital:  No penile drainage or rash, no testicular pain or swelling, no scrotal rash or swelling. ;; Musculoskeletal: Negative for back pain and neck pain. Negative for swelling and trauma.; ; Skin: Negative for pruritus, rash, abrasions, blisters, bruising and skin lesion.; ; Neuro: Negative  for headache, lightheadedness and neck stiffness. Negative for weakness, altered level of consciousness, altered mental status, extremity weakness, paresthesias, involuntary movement, seizure and syncope.       Physical Exam Updated Vital Signs BP 123/70 (BP Location: Right Arm)   Pulse 89   Temp (!) 97.5 F (36.4 C) (Tympanic)   Resp 20   Ht 5\' 9"  (1.753 m)   Wt 72.6 kg   SpO2 100%   BMI 23.63 kg/m    BP 129/81   Pulse 80   Temp (!) 97.5 F (36.4 C) (Tympanic)   Resp 18   Ht 5\' 9"  (1.753 m)   Wt 72.6 kg   SpO2 94%   BMI 23.63 kg/m    Physical Exam 1310: Physical examination:  Nursing notes reviewed; Vital signs and O2 SAT reviewed;  Constitutional: Well developed, Well nourished, Uncomfortable appearing.; Head:  Normocephalic, atraumatic; Eyes: EOMI, PERRL, No scleral icterus; ENMT: Mouth and pharynx normal, Mucous membranes dry; Neck: Supple, Full range of motion, No lymphadenopathy; Cardiovascular: Regular rate and rhythm, No gallop; Respiratory: Breath sounds clear & equal bilaterally, No wheezes.  Speaking full sentences with ease, Normal respiratory effort/excursion; Chest: Nontender, Movement normal; Abdomen: Soft, +diffuse tenderness to palp. Nondistended, Normal bowel sounds; Genitourinary: No CVA tenderness; Extremities: Peripheral pulses normal, No tenderness, No edema, No calf edema or asymmetry.; Neuro: AA&Ox3, Major CN grossly intact.  Speech clear. No gross focal motor or sensory deficits in extremities.; Skin: Color pale, Warm, Dry.   ED Treatments / Results  Labs (all labs ordered are listed, but only abnormal results are displayed)   EKG EKG Interpretation  Date/Time:  Tuesday April 17 2018 14:02:07 EST Ventricular Rate:  75 PR Interval:    QRS Duration: 73 QT Interval:  456 QTC Calculation: 510 R Axis:   44 Text Interpretation:  Sinus rhythm Low voltage, precordial leads Borderline repolarization abnormality Prolonged QT interval Baseline wander  When compared with ECG of 04/01/2018 QT has lengthened otherwise no apparent changes Confirmed by Francine Graven [phone]) on 04/17/2018 2:12:55 PM   Radiology   Procedures Procedures (including critical care time)  Medications Ordered in ED Medications  morphine 4 MG/ML injection 4 mg (4 mg Intravenous Given 04/17/18 1343)  ondansetron (ZOFRAN) injection 4 mg (has no administration in time range)  0.9 %  sodium chloride infusion ( Intravenous New Bag/Given 04/17/18 1347)  famotidine (PEPCID) IVPB 20 mg premix (has no administration in time range)     Initial Impression / Assessment and Plan / ED Course  I have reviewed the triage vital signs and the nursing notes.  Pertinent labs & imaging results that were available  during my care of the patient were reviewed by me and considered in my medical decision making (see chart for details).  MDM Reviewed: previous chart, nursing note and vitals Reviewed previous: labs and ECG Interpretation: labs, ECG, x-ray and CT scan    Results for orders placed or performed during the hospital encounter of 04/17/18  Lipase, blood  Result Value Ref Range   Lipase 21 11 - 51 U/L  Comprehensive metabolic panel  Result Value Ref Range   Sodium 137 135 - 145 mmol/L   Potassium 3.9 3.5 - 5.1 mmol/L   Chloride 106 98 - 111 mmol/L   CO2 24 22 - 32 mmol/L   Glucose, Bld 121 (H) 70 - 99 mg/dL   BUN 11 8 - 23 mg/dL   Creatinine, Ser 1.24 0.61 - 1.24 mg/dL   Calcium 9.1 8.9 - 10.3 mg/dL   Total Protein 7.2 6.5 - 8.1 g/dL   Albumin 4.2 3.5 - 5.0 g/dL   AST 31 15 - 41 U/L   ALT 14 0 - 44 U/L   Alkaline Phosphatase 59 38 - 126 U/L   Total Bilirubin 0.6 0.3 - 1.2 mg/dL   GFR calc non Af Amer 57 (L) >60 mL/min   GFR calc Af Amer >60 >60 mL/min   Anion gap 7 5 - 15  Urinalysis, Routine w reflex microscopic  Result Value Ref Range   Color, Urine YELLOW YELLOW   APPearance CLEAR CLEAR   Specific Gravity, Urine 1.015 1.005 - 1.030   pH 5.0 5.0 - 8.0    Glucose, UA NEGATIVE NEGATIVE mg/dL   Hgb urine dipstick LARGE (A) NEGATIVE   Bilirubin Urine NEGATIVE NEGATIVE   Ketones, ur NEGATIVE NEGATIVE mg/dL   Protein, ur 30 (A) NEGATIVE mg/dL   Nitrite NEGATIVE NEGATIVE   Leukocytes, UA NEGATIVE NEGATIVE   RBC / HPF >50 (H) 0 - 5 RBC/hpf   WBC, UA 6-10 0 - 5 WBC/hpf   Bacteria, UA NONE SEEN NONE SEEN   Squamous Epithelial / LPF 0-5 0 - 5   Mucus PRESENT    Budding Yeast PRESENT   CBC with Differential  Result Value Ref Range   WBC 4.0 4.0 - 10.5 K/uL   RBC 4.33 4.22 - 5.81 MIL/uL   Hemoglobin 12.2 (L) 13.0 - 17.0 g/dL   HCT 37.7 (L) 39.0 - 52.0 %   MCV 87.1 80.0 - 100.0 fL   MCH 28.2 26.0 - 34.0 pg   MCHC 32.4 30.0 - 36.0 g/dL   RDW 14.6 11.5 - 15.5 %   Platelets 182 150 - 400 K/uL   nRBC 0.0 0.0 - 0.2 %   Neutrophils Relative % 72 %   Neutro Abs 2.9 1.7 - 7.7 K/uL   Lymphocytes Relative 16 %   Lymphs Abs 0.7 0.7 - 4.0 K/uL   Monocytes Relative 6 %   Monocytes Absolute 0.3 0.1 - 1.0 K/uL   Eosinophils Relative 4 %   Eosinophils Absolute 0.2 0.0 - 0.5 K/uL   Basophils Relative 2 %   Basophils Absolute 0.1 0.0 - 0.1 K/uL   Immature Granulocytes 0 %   Abs Immature Granulocytes 0.01 0.00 - 0.07 K/uL  Troponin I - Once  Result Value Ref Range   Troponin I <0.03 <0.03 ng/mL   Dg Chest 2 View Result Date: 04/17/2018 CLINICAL DATA:  Chest pain with shortness of breath. EXAM: CHEST - 2 VIEW COMPARISON:  Chest x-rays dated 04/01/2018, 02/21/2018 and 11/19/2017. Chest CT dated 02/06/2018. FINDINGS:  Heart size and mediastinal contours are stable. Again noted are post radiation changes within the RIGHT perihilar lung to the RIGHT lung base. No new lung findings. No pleural effusion or pneumothorax seen. No acute or suspicious osseous finding. IMPRESSION: No active cardiopulmonary disease. No evidence of pneumonia or pulmonary edema. Electronically Signed   By: Franki Cabot M.D.   On: 04/17/2018 14:30   Ct Abdomen Pelvis W  Contrast Result Date: 04/17/2018 CLINICAL DATA:  Generalized abdominal pain since last evening. History of right lung cancer with chemo and radiation. EXAM: CT ABDOMEN AND PELVIS WITH CONTRAST TECHNIQUE: Multidetector CT imaging of the abdomen and pelvis was performed using the standard protocol following bolus administration of intravenous contrast. CONTRAST:  135mL ISOVUE-300 IOPAMIDOL (ISOVUE-300) INJECTION 61% COMPARISON:  04/01/2018 FINDINGS: Lower chest: Normal heart size without pericardial effusion or thickening. There is coronary arteriosclerosis along the included RCA and left circumflex arteries. Patchy right middle and lower lobe airspace disease with air bronchograms are redemonstrated without significant interval change. Findings may reflect chronic post treatment change. Hepatobiliary: No focal liver abnormality is seen. No gallstones, gallbladder wall thickening, or biliary dilatation. Pancreas: Unremarkable. No pancreatic ductal dilatation or surrounding inflammatory changes. Spleen: Normal in size without focal abnormality. Adrenals/Urinary Tract: Stable 2 cm right renal pelvic stone with an additional 6 mm calculus anteriorly within the interpolar right kidney, stable in appearance. Slight dilatation of the right intrarenal collecting system is stable. Stable right renal cyst in the upper pole of the right kidney measuring up to 8 mm. No solid enhancing masses. No hydroureteronephrosis. Equivocal nonobstructing calculus in the distal left ureter, series 2/75 measuring 1 mm. Stomach/Bowel: Small hiatal hernia. Physiologic distention of the stomach. Normal duodenal sweep and ligament of Treitz position. No bowel obstruction or inflammation. Normal-appearing appendix. Moderate amount of stool within large bowel with scattered colonic diverticulosis along the distal descending proximal sigmoid colon. No acute bowel obstruction or inflammation. Vascular/Lymphatic: Aortic atherosclerosis. No enlarged  abdominal or pelvic lymph nodes. Reproductive: Mildly enlarged and heterogeneous prostate gland. Other: Small fat containing bilateral inguinal hernias. Musculoskeletal: Thoracolumbar spondylosis. No aggressive osseous lesions. IMPRESSION: 1. Stable 2 cm right renal pelvic stone with mild chronic ectasia of the right renal collecting system. 2. Punctate 1 mm calcification is suggested in the distal left ureter just proximal to the left UVJ without hydroureteronephrosis. 3. Increased stool burden within the colon left-sided colonic diverticulosis. Diverticulitis. 4. Coronary arteriosclerosis. 5. Chronic stable patchy opacities at the right lung base with air bronchograms. Given history of treated right lung cancer, findings may represent post treatment change or pneumonia in the appropriate clinical setting. 6. Ancillary findings as above. Electronically Signed   By: Ashley Royalty M.D.   On: 04/17/2018 15:31    1630:  Pt has ambulated with steady gait, easy resps, NAD, Sats 93-98% R/A. Denied CP/SOB while ambulating. Pt denies cough, WBC count normal and pt remains afebrile: likely post-treatment change on CT scan vs pneumonia at this time. Pt has tol PO well without N/V. No clear UTI on Udip. Pain controlled. No clear indication for admission at this time. Dx and testing d/w pt.  Questions answered.  Verb understanding, agreeable to d/c home with outpt f/u.       Final Clinical Impressions(s) / ED Diagnoses   Final diagnoses:  None    ED Discharge Orders    None       Francine Graven, DO 04/21/18 [id number]

## 2018-04-17 NOTE — ED Triage Notes (Signed)
PT c/o generalized abdominal pain but denies any n/v/d since last night. PT states had BM yesterday. PT denies any urinary symptoms.

## 2018-04-17 NOTE — Discharge Instructions (Addendum)
Take the prescription as directed.  Call your regular medical doctor today to schedule a follow up appointment within the next 2 days. Call the Urologist today to schedule a follow up appointment within the next week.  Return to the Emergency Department immediately sooner if worsening.

## 2018-04-17 NOTE — ED Notes (Signed)
Pt given water to drink. Pt tolerating well at this time.

## 2018-04-19 ENCOUNTER — Other Ambulatory Visit: Payer: Medicare HMO

## 2018-04-19 ENCOUNTER — Ambulatory Visit: Payer: Medicare HMO | Admitting: Oncology

## 2018-04-19 ENCOUNTER — Ambulatory Visit: Payer: Medicare HMO

## 2018-04-24 ENCOUNTER — Observation Stay (HOSPITAL_COMMUNITY): Payer: Medicare HMO

## 2018-04-24 ENCOUNTER — Other Ambulatory Visit: Payer: Self-pay

## 2018-04-24 ENCOUNTER — Encounter (HOSPITAL_COMMUNITY): Payer: Self-pay | Admitting: *Deleted

## 2018-04-24 ENCOUNTER — Emergency Department (HOSPITAL_COMMUNITY): Payer: Medicare HMO

## 2018-04-24 ENCOUNTER — Observation Stay (HOSPITAL_COMMUNITY)
Admission: EM | Admit: 2018-04-24 | Discharge: 2018-04-25 | Disposition: A | Discharge status: Home / Self Care | Payer: Medicare HMO | Attending: Internal Medicine | Admitting: Internal Medicine

## 2018-04-24 DIAGNOSIS — E1122 Type 2 diabetes mellitus with diabetic chronic kidney disease: Secondary | ICD-10-CM | POA: Diagnosis not present

## 2018-04-24 DIAGNOSIS — Z86711 Personal history of pulmonary embolism: Secondary | ICD-10-CM | POA: Diagnosis not present

## 2018-04-24 DIAGNOSIS — Z7984 Long term (current) use of oral hypoglycemic drugs: Secondary | ICD-10-CM | POA: Diagnosis not present

## 2018-04-24 DIAGNOSIS — N183 Chronic kidney disease, stage 3 unspecified: Secondary | ICD-10-CM

## 2018-04-24 DIAGNOSIS — J449 Chronic obstructive pulmonary disease, unspecified: Secondary | ICD-10-CM | POA: Insufficient documentation

## 2018-04-24 DIAGNOSIS — J9600 Acute respiratory failure, unspecified whether with hypoxia or hypercapnia: Secondary | ICD-10-CM | POA: Diagnosis not present

## 2018-04-24 DIAGNOSIS — R3129 Other microscopic hematuria: Secondary | ICD-10-CM

## 2018-04-24 DIAGNOSIS — J441 Chronic obstructive pulmonary disease with (acute) exacerbation: Principal | ICD-10-CM | POA: Diagnosis present

## 2018-04-24 DIAGNOSIS — E119 Type 2 diabetes mellitus without complications: Secondary | ICD-10-CM

## 2018-04-24 DIAGNOSIS — E785 Hyperlipidemia, unspecified: Secondary | ICD-10-CM | POA: Diagnosis not present

## 2018-04-24 DIAGNOSIS — I10 Essential (primary) hypertension: Secondary | ICD-10-CM

## 2018-04-24 DIAGNOSIS — R0602 Shortness of breath: Secondary | ICD-10-CM | POA: Diagnosis not present

## 2018-04-24 DIAGNOSIS — IMO0002 Reserved for concepts with insufficient information to code with codable children: Secondary | ICD-10-CM | POA: Diagnosis present

## 2018-04-24 DIAGNOSIS — D649 Anemia, unspecified: Secondary | ICD-10-CM | POA: Diagnosis not present

## 2018-04-24 DIAGNOSIS — E0865 Diabetes mellitus due to underlying condition with hyperglycemia: Secondary | ICD-10-CM | POA: Diagnosis present

## 2018-04-24 DIAGNOSIS — R531 Weakness: Secondary | ICD-10-CM

## 2018-04-24 DIAGNOSIS — R918 Other nonspecific abnormal finding of lung field: Secondary | ICD-10-CM | POA: Diagnosis not present

## 2018-04-24 DIAGNOSIS — E039 Hypothyroidism, unspecified: Secondary | ICD-10-CM | POA: Diagnosis not present

## 2018-04-24 DIAGNOSIS — G20 Parkinson's disease: Secondary | ICD-10-CM | POA: Insufficient documentation

## 2018-04-24 DIAGNOSIS — Z720 Tobacco use: Secondary | ICD-10-CM

## 2018-04-24 DIAGNOSIS — C3401 Malignant neoplasm of right main bronchus: Secondary | ICD-10-CM | POA: Diagnosis not present

## 2018-04-24 DIAGNOSIS — C3491 Malignant neoplasm of unspecified part of right bronchus or lung: Secondary | ICD-10-CM | POA: Diagnosis present

## 2018-04-24 DIAGNOSIS — R110 Nausea: Secondary | ICD-10-CM | POA: Diagnosis not present

## 2018-04-24 DIAGNOSIS — I2510 Atherosclerotic heart disease of native coronary artery without angina pectoris: Secondary | ICD-10-CM | POA: Insufficient documentation

## 2018-04-24 DIAGNOSIS — N200 Calculus of kidney: Secondary | ICD-10-CM

## 2018-04-24 DIAGNOSIS — R109 Unspecified abdominal pain: Secondary | ICD-10-CM

## 2018-04-24 DIAGNOSIS — R630 Anorexia: Secondary | ICD-10-CM

## 2018-04-24 LAB — PROTIME-INR
INR: 1.21
Prothrombin Time: 15.2 seconds (ref 11.4–15.2)

## 2018-04-24 LAB — COMPREHENSIVE METABOLIC PANEL
ALT: 9 U/L (ref 0–44)
AST: 22 U/L (ref 15–41)
Albumin: 4 g/dL (ref 3.5–5.0)
Alkaline Phosphatase: 61 U/L (ref 38–126)
Anion gap: 8 (ref 5–15)
BUN: 10 mg/dL (ref 8–23)
CO2: 23 mmol/L (ref 22–32)
Calcium: 8.7 mg/dL — ABNORMAL LOW (ref 8.9–10.3)
Chloride: 102 mmol/L (ref 98–111)
Creatinine, Ser: 1.41 mg/dL — ABNORMAL HIGH (ref 0.61–1.24)
GFR calc Af Amer: 57 mL/min — ABNORMAL LOW (ref >60)
GFR calc non Af Amer: 49 mL/min — ABNORMAL LOW (ref >60)
Glucose, Bld: 198 mg/dL — ABNORMAL HIGH (ref 70–99)
Potassium: 3.5 mmol/L (ref 3.5–5.1)
Sodium: 133 mmol/L — ABNORMAL LOW (ref 135–145)
Total Bilirubin: 1.2 mg/dL (ref 0.3–1.2)
Total Protein: 7.2 g/dL (ref 6.5–8.1)

## 2018-04-24 LAB — URINALYSIS, ROUTINE W REFLEX MICROSCOPIC
Bilirubin Urine: NEGATIVE
Glucose, UA: NEGATIVE mg/dL
Ketones, ur: NEGATIVE mg/dL
Leukocytes, UA: NEGATIVE
Nitrite: NEGATIVE
PH: 6 (ref 5.0–8.0)
Protein, ur: NEGATIVE mg/dL
RBC / HPF: >50 RBC/hpf — ABNORMAL HIGH (ref 0–5)
Specific Gravity, Urine: 1.016 (ref 1.005–1.030)

## 2018-04-24 LAB — CBC WITH DIFFERENTIAL/PLATELET
Abs Immature Granulocytes: 0.02 10*3/uL (ref 0.00–0.07)
BASOS ABS: 0.1 10*3/uL (ref 0.0–0.1)
Basophils Relative: 2 %
Eosinophils Absolute: 0.1 10*3/uL (ref 0.0–0.5)
Eosinophils Relative: 3 %
HCT: 37.1 % — ABNORMAL LOW (ref 39.0–52.0)
Hemoglobin: 12 g/dL — ABNORMAL LOW (ref 13.0–17.0)
Immature Granulocytes: 0 %
Lymphocytes Relative: 14 %
Lymphs Abs: 0.6 10*3/uL — ABNORMAL LOW (ref 0.7–4.0)
MCH: 28.1 pg (ref 26.0–34.0)
MCHC: 32.3 g/dL (ref 30.0–36.0)
MCV: 86.9 fL (ref 80.0–100.0)
Monocytes Absolute: 0.4 10*3/uL (ref 0.1–1.0)
Monocytes Relative: 9 %
NRBC: 0 % (ref 0.0–0.2)
Neutro Abs: 3.4 10*3/uL (ref 1.7–7.7)
Neutrophils Relative %: 72 %
Platelets: 179 10*3/uL (ref 150–400)
RBC: 4.27 MIL/uL (ref 4.22–5.81)
RDW: 14.7 % (ref 11.5–15.5)
WBC: 4.7 10*3/uL (ref 4.0–10.5)

## 2018-04-24 LAB — LACTIC ACID, PLASMA
Lactic Acid, Venous: 0.9 mmol/L (ref 0.5–1.9)
Lactic Acid, Venous: 0.6 mmol/L (ref 0.5–1.9)

## 2018-04-24 LAB — GLUCOSE, CAPILLARY: Glucose-Capillary: 193 mg/dL — ABNORMAL HIGH (ref 70–99)

## 2018-04-24 MED ORDER — LEVOTHYROXINE SODIUM 100 MCG PO TABS
100.0000 ug | ORAL_TABLET | Freq: Every day | ORAL | Status: DC
  Administered 2018-04-25: 100 ug via ORAL
  Filled 2018-04-24: qty 1

## 2018-04-24 MED ORDER — GLUCERNA SHAKE PO LIQD
237.0000 mL | Freq: Three times a day (TID) | ORAL | Status: DC
  Administered 2018-04-25 (×2): 237 mL via ORAL

## 2018-04-24 MED ORDER — SERTRALINE HCL 50 MG PO TABS
50.0000 mg | ORAL_TABLET | Freq: Every day | ORAL | Status: DC
  Administered 2018-04-25: 50 mg via ORAL
  Filled 2018-04-24: qty 1

## 2018-04-24 MED ORDER — IPRATROPIUM-ALBUTEROL 0.5-2.5 (3) MG/3ML IN SOLN
3.0000 mL | Freq: Once | RESPIRATORY_TRACT | Status: AC
  Administered 2018-04-24: 3 mL via RESPIRATORY_TRACT
  Filled 2018-04-24: qty 3

## 2018-04-24 MED ORDER — ENOXAPARIN SODIUM 40 MG/0.4ML ~~LOC~~ SOLN: 40.0000 mg | SUBCUTANEOUS | Status: DC

## 2018-04-24 MED ORDER — DICYCLOMINE HCL 10 MG PO CAPS
20.0000 mg | ORAL_CAPSULE | Freq: Three times a day (TID) | ORAL | Status: DC
  Administered 2018-04-25 (×3): 20 mg via ORAL
  Filled 2018-04-24 (×3): qty 2

## 2018-04-24 MED ORDER — SODIUM CHLORIDE 0.9 % IV BOLUS
1000.0000 mL | Freq: Once | INTRAVENOUS | Status: AC
  Administered 2018-04-24: 1000 mL via INTRAVENOUS

## 2018-04-24 MED ORDER — METHYLPREDNISOLONE SODIUM SUCC 125 MG IJ SOLR
125.0000 mg | INTRAMUSCULAR | Status: AC
  Administered 2018-04-24: 125 mg via INTRAVENOUS
  Filled 2018-04-24: qty 2

## 2018-04-24 MED ORDER — ROSUVASTATIN CALCIUM 20 MG PO TABS
20.0000 mg | ORAL_TABLET | Freq: Every day | ORAL | Status: DC
  Administered 2018-04-25 (×2): 20 mg via ORAL
  Filled 2018-04-24 (×3): qty 1

## 2018-04-24 MED ORDER — SODIUM CHLORIDE 0.9 % IV SOLN
500.0000 mg | Freq: Once | INTRAVENOUS | Status: AC
  Administered 2018-04-24: 500 mg via INTRAVENOUS
  Filled 2018-04-24: qty 500

## 2018-04-24 MED ORDER — DM-GUAIFENESIN ER 30-600 MG PO TB12
1.0000 | ORAL_TABLET | Freq: Two times a day (BID) | ORAL | Status: DC
  Administered 2018-04-25: 1 via ORAL
  Filled 2018-04-24: qty 1

## 2018-04-24 MED ORDER — ALBUTEROL SULFATE (2.5 MG/3ML) 0.083% IN NEBU
2.5000 mg | INHALATION_SOLUTION | Freq: Once | RESPIRATORY_TRACT | Status: AC
  Administered 2018-04-24: 2.5 mg via RESPIRATORY_TRACT
  Filled 2018-04-24: qty 3

## 2018-04-24 MED ORDER — IOPAMIDOL (ISOVUE-370) INJECTION 76%
100.0000 mL | Freq: Once | INTRAVENOUS | Status: AC | PRN
  Administered 2018-04-24: 80 mL via INTRAVENOUS

## 2018-04-24 MED ORDER — IPRATROPIUM-ALBUTEROL 0.5-2.5 (3) MG/3ML IN SOLN
3.0000 mL | Freq: Four times a day (QID) | RESPIRATORY_TRACT | Status: DC
  Administered 2018-04-25 (×2): 3 mL via RESPIRATORY_TRACT
  Filled 2018-04-24 (×2): qty 3

## 2018-04-24 MED ORDER — AZITHROMYCIN 250 MG PO TABS
250.0000 mg | ORAL_TABLET | Freq: Every day | ORAL | Status: DC
  Administered 2018-04-25: 250 mg via ORAL
  Filled 2018-04-24: qty 1

## 2018-04-24 MED ORDER — PANTOPRAZOLE SODIUM 40 MG PO TBEC
40.0000 mg | DELAYED_RELEASE_TABLET | Freq: Two times a day (BID) | ORAL | Status: DC
  Administered 2018-04-25 (×2): 40 mg via ORAL
  Filled 2018-04-24 (×2): qty 1

## 2018-04-24 MED ORDER — EZETIMIBE 10 MG PO TABS
10.0000 mg | ORAL_TABLET | Freq: Every day | ORAL | Status: DC
  Administered 2018-04-25: 10 mg via ORAL
  Filled 2018-04-24 (×2): qty 1

## 2018-04-24 MED ORDER — ACETAMINOPHEN 650 MG RE SUPP: 650.0000 mg | Freq: Four times a day (QID) | RECTAL | Status: DC | PRN

## 2018-04-24 MED ORDER — ACETAMINOPHEN 325 MG PO TABS: 650.0000 mg | ORAL_TABLET | Freq: Four times a day (QID) | ORAL | Status: DC | PRN

## 2018-04-24 MED ORDER — SUCRALFATE 1 GM/10ML PO SUSP
1.0000 g | Freq: Three times a day (TID) | ORAL | Status: DC
  Administered 2018-04-25 (×2): 1 g via ORAL
  Filled 2018-04-24 (×2): qty 10

## 2018-04-24 MED ORDER — PREDNISONE 20 MG PO TABS: 40.0000 mg | ORAL_TABLET | Freq: Every day | ORAL | Status: DC

## 2018-04-24 MED ORDER — INSULIN ASPART 100 UNIT/ML ~~LOC~~ SOLN
0.0000 [IU] | Freq: Three times a day (TID) | SUBCUTANEOUS | Status: DC
  Administered 2018-04-25: 5 [IU] via SUBCUTANEOUS
  Administered 2018-04-25: 9 [IU] via SUBCUTANEOUS

## 2018-04-24 MED ORDER — APIXABAN 5 MG PO TABS
5.0000 mg | ORAL_TABLET | Freq: Two times a day (BID) | ORAL | Status: DC
  Administered 2018-04-25 (×2): 5 mg via ORAL
  Filled 2018-04-24 (×2): qty 1

## 2018-04-24 MED ORDER — HYDROCODONE-ACETAMINOPHEN 5-325 MG PO TABS: 1.0000 | ORAL_TABLET | Freq: Two times a day (BID) | ORAL | Status: DC | PRN

## 2018-04-24 MED ORDER — GABAPENTIN 300 MG PO CAPS
300.0000 mg | ORAL_CAPSULE | Freq: Three times a day (TID) | ORAL | Status: DC
  Administered 2018-04-25 (×3): 300 mg via ORAL
  Filled 2018-04-24 (×3): qty 1

## 2018-04-24 NOTE — ED Triage Notes (Signed)
Pt c/o fever that started last night with generalized weakness,

## 2018-04-24 NOTE — H&P (Signed)
History and Physical    Matthew Wilkins MGQ:[id number] DOB: 18-Aug-1945 DOA: 04/24/2018  PCP: [name], MD Patient coming from: Home  Chief Complaint: Generalized weakness, shortness of breath  HPI: Matthew Wilkins is a 72 y.o. male with medical history significant of stage III adenocarcinoma of right lung, anxiety, CAD, type 2 diabetes, DVT and PE on anticoagulation with apixaban, hypertension, hyperlipidemia, recently admitted in November 2019 for HCAP presenting to the hospital for evaluation of generalized weakness and shortness of breath.  Patient is a poor historian and it was difficult to obtain a thorough history from him.  Daughter at bedside states that patient has been having generalized weakness and shortness of breath since his prior hospitalization for pneumonia.  He has been coughing and not eating much.  He has been having chills.  Patient reports using his COPD inhalers but daughter at bedside does not seem to agree.  Patient is not sure if he has been wheezing.  Denies having any chest pain.  Reports having epigastric abdominal pain for the past 3 to 4 months.  Denies having any nausea, vomiting, or diarrhea.  States he is having regular bowel movements.  Denies having any dysphagia or odynophagia.  States he continues to smoke 1.5 pack/day and does not want to quit.  He does not want to use a nicotine patch in the hospital.  Daughter at bedside is concerned that the patient lives alone at home and would like for him to go to a rehab facility.    ED Course: On arrival-afebrile, blood pressure 94/55, SPO2 80% on room air, tachypneic.  No leukocytosis.  Lactic acid normal x2.  LFTs normal.  EKG not suggestive of ACS.  Chest x-ray showing right midlung field scarring consistent with prior radiation therapy; no acute abnormality.  Chest CTA negative for PE.  CTA showing postradiation changes in the right lung, small right pleural effusion, stable changes of COPD and chronic  bronchitis.  Also showing stable mild diffuse esophageal wall thickening, most likely due to radiation esophagitis or reflux esophagitis.  Review of Systems: As per HPI otherwise 10 point review of systems negative.  Past Medical History:  Diagnosis Date  . Adenocarcinoma of right lung, stage 3 ([place]) 11/17/2016  . Anxiety   . Arthritis   . Coronary artery disease   . Diabetes mellitus   . Diverticulitis   . DVT (deep venous thrombosis) ([place])   . Encounter for antineoplastic chemotherapy 11/17/2016  . GERD (gastroesophageal reflux disease)   . History of radiation therapy 11/28/16-01/06/17   right lung was treated to 60 Gy in 30 fractions  . Hypercholesterolemia   . Hypertension   . PAD (peripheral artery disease) ([place])   . SOB (shortness of breath)     Past Surgical History:  Procedure Laterality Date  . CARDIAC CATHETERIZATION N/A 06/10/2015   Procedure: Left Heart Cath and Coronary Angiography;  Surgeon: Burnell Blanks, MD;  Location: [facility];  Service: Cardiovascular;  Laterality: N/A;  . LOWER EXTREMITY ANGIOGRAM N/A 07/13/2011   Procedure: LOWER EXTREMITY ANGIOGRAM;  Surgeon: Burnell Blanks, MD;  Location: [facility] CATH LAB;  Service: Cardiovascular;  Laterality: N/A;  . OTHER SURGICAL HISTORY    . OTHER SURGICAL HISTORY    . OTHER SURGICAL HISTORY    . PERCUTANEOUS STENT INTERVENTION Right 07/13/2011   Procedure: PERCUTANEOUS STENT INTERVENTION;  Surgeon: Burnell Blanks, MD;  Location: [facility] CATH LAB;  Service: Cardiovascular;  Laterality: Right;  reports that he has been smoking cigarettes. He has a 65.00 pack-year smoking history. He has never used smokeless tobacco. He reports that he does not drink alcohol or use drugs.  Allergies  Allergen Reactions  . Aspirin Other (See Comments)    Nose bleeds     Family History  Family history unknown: Yes    Prior to Admission medications   Medication Sig Start Date End Date Taking? Authorizing  Provider  metFORMIN (GLUCOPHAGE-XR) 500 MG 24 hr tablet Take 2 tablets by mouth 2 (two) times daily. 04/19/16  Yes [provider]  sucralfate (CARAFATE) 1 GM/10ML suspension Take 10 mLs (1 g total) by mouth 4 (four) times daily -  with meals and at bedtime. 02/24/18  Yes Annita Brod, MD  albuterol (PROVENTIL HFA;VENTOLIN HFA) 108 (90 Base) MCG/ACT inhaler Inhale 1-2 puffs into the lungs every 6 (six) hours as needed for wheezing or shortness of breath.  04/21/17   [provider]  apixaban (ELIQUIS) 5 MG TABS tablet Take 1 tablet (5 mg total) by mouth 2 (two) times daily. 07/17/17   Dhungel, Flonnie Overman, MD  cefdinir (OMNICEF) 300 MG capsule Take 1 capsule (300 mg total) by mouth 2 (two) times daily. Patient not taking: Reported on 04/24/2018 04/03/18   Patrecia Pour, MD  dicyclomine (BENTYL) 20 MG tablet Take 1 tablet by mouth 3 (three) times daily. 10/26/17   [provider]  feeding supplement, GLUCERNA SHAKE, (GLUCERNA SHAKE) LIQD Take 237 mLs by mouth 3 (three) times daily between meals. 07/17/17   Dhungel, Nishant, MD  Fluticasone-Salmeterol (ADVAIR DISKUS) 250-50 MCG/DOSE AEPB Inhale 1 puff into the lungs every 12 (twelve) hours.  04/21/17   [provider]  gabapentin (NEURONTIN) 300 MG capsule Take 1 capsule by mouth 3 (three) times daily. 04/19/16   [provider]  glipiZIDE (GLUCOTROL XL) 5 MG 24 hr tablet Take 10 mg by mouth 2 (two) times daily.  03/11/15   [provider]  guaiFENesin (MUCINEX) 600 MG 12 hr tablet Take 1 tablet (600 mg total) by mouth 2 (two) times daily. 07/17/17 07/17/18  Dhungel, Nishant, MD  guaiFENesin-dextromethorphan (ROBITUSSIN DM) 100-10 MG/5ML syrup Take 5 mLs by mouth every 4 (four) hours as needed for cough. 07/17/17   Dhungel, Flonnie Overman, MD  HYDROcodone-acetaminophen (NORCO/VICODIN) 5-325 MG tablet 1 tab PO q12 hours prn pain 04/17/18   Francine Graven, DO  levothyroxine (SYNTHROID, LEVOTHROID) 100 MCG tablet TAKE 1  TABLET DAILY BEFORE BREAKFAST. 02/12/18   Maryanna Shape, NP  ondansetron (ZOFRAN) 4 MG tablet Take 1 tablet (4 mg total) by mouth every 6 (six) hours as needed. 02/21/18   Julianne Rice, MD  pantoprazole (PROTONIX) 40 MG tablet Take 1 tablet (40 mg total) by mouth 2 (two) times daily. 02/24/18   Annita Brod, MD  rosuvastatin (CRESTOR) 20 MG tablet Take 1 tablet (20 mg total) by mouth daily. 10/07/14   Burnell Blanks, MD  sertraline (ZOLOFT) 25 MG tablet Take 50 mg by mouth daily.  10/27/16   [provider]  ZETIA 10 MG tablet Take 10 mg by mouth daily. 04/03/15   [provider]    Physical Exam: Vitals:   04/24/18 2000 04/24/18 2046 04/24/18 2200 04/24/18 2301  BP: 133/78  (!) 98/54 110/70  Pulse:    88  Resp:   (!) 21 14  Temp:    97.8 F (36.6 C)  TempSrc:    Oral  SpO2:  99%  100%  Weight:  Height:        Physical Exam  Constitutional: He is oriented to person, place, and time.  Appears very lethargic  HENT:  Head: Normocephalic.  Eyes: Right eye exhibits no discharge. Left eye exhibits no discharge.  Neck: Neck supple.  Cardiovascular: Normal rate, regular rhythm and intact distal pulses.  Pulmonary/Chest: Effort normal. He has no wheezes. He has no rales.  On 2 L supplemental oxygen  Abdominal: Soft. Bowel sounds are normal. He exhibits no distension. There is tenderness. There is no rebound and no guarding.  Mild generalized tenderness to palpation  Musculoskeletal: He exhibits no edema.  Neurological: He is alert and oriented to person, place, and time.  Skin: Skin is warm and dry. He is not diaphoretic.     Labs on Admission: I have personally reviewed following labs and imaging studies  CBC: Recent Labs  Lab 04/24/18 1406  WBC 4.7  NEUTROABS 3.4  HGB 12.0*  HCT 37.1*  MCV 86.9  PLT 725   Basic Metabolic Panel: Recent Labs  Lab 04/24/18 1406  NA 133*  K 3.5  CL 102  CO2 23  GLUCOSE 198*  BUN 10    CREATININE 1.41*  CALCIUM 8.7*   GFR: Estimated Creatinine Clearance: 46.7 mL/min (A) (by C-G formula based on SCr of 1.41 mg/dL (H)). Liver Function Tests: Recent Labs  Lab 04/24/18 1406  AST 22  ALT 9  ALKPHOS 61  BILITOT 1.2  PROT 7.2  ALBUMIN 4.0   No results for input(s): LIPASE, AMYLASE in the last 168 hours. No results for input(s): AMMONIA in the last 168 hours. Coagulation Profile: Recent Labs  Lab 04/24/18 1406  INR 1.21   Cardiac Enzymes: No results for input(s): CKTOTAL, CKMB, CKMBINDEX, TROPONINI in the last 168 hours. BNP (last 3 results) No results for input(s): PROBNP in the last 8760 hours. HbA1C: No results for input(s): HGBA1C in the last 72 hours. CBG: Recent Labs  Lab 04/24/18 2300  GLUCAP 193*   Lipid Profile: No results for input(s): CHOL, HDL, LDLCALC, TRIG, CHOLHDL, LDLDIRECT in the last 72 hours. Thyroid Function Tests: No results for input(s): TSH, T4TOTAL, FREET4, T3FREE, THYROIDAB in the last 72 hours. Anemia Panel: No results for input(s): VITAMINB12, FOLATE, FERRITIN, TIBC, IRON, RETICCTPCT in the last 72 hours. Urine analysis:    Component Value Date/Time   COLORURINE YELLOW 04/24/2018 1332   APPEARANCEUR CLEAR 04/24/2018 1332   LABSPEC 1.016 04/24/2018 1332   PHURINE 6.0 04/24/2018 1332   GLUCOSEU NEGATIVE 04/24/2018 1332   HGBUR MODERATE (A) 04/24/2018 1332   BILIRUBINUR NEGATIVE 04/24/2018 1332   KETONESUR NEGATIVE 04/24/2018 1332   PROTEINUR NEGATIVE 04/24/2018 1332   UROBILINOGEN 0.2 01/27/2010 1529   NITRITE NEGATIVE 04/24/2018 1332   LEUKOCYTESUR NEGATIVE 04/24/2018 1332    Radiological Exams on Admission: Dg Chest 2 View  Result Date: 04/24/2018 CLINICAL DATA:  History of adenocarcinoma right lung. Radiation therapy. EXAM: CHEST - 2 VIEW COMPARISON:  04/17/2018. FINDINGS: Mediastinum and hilar structures are normal. Heart size normal. COPD. Findings consistent atelectasis and scarring right mid lung field. This  most likely secondary to radiation therapy. Similar findings noted on prior exam. No acute abnormality identified. No pleural effusion or pneumothorax IMPRESSION: Right mid lung field/scarring consistent with prior radiation therapy. Similar findings noted on prior exam. No acute abnormality. Electronically Signed   By: Marcello Moores  Register   On: 04/24/2018 14:35   Dg Abd 1 View  Result Date: 04/24/2018 CLINICAL DATA:  72 y/o  M; nausea  and abdominal pain. EXAM: ABDOMEN - 1 VIEW COMPARISON:  04/17/2018 CT abdomen and pelvis. FINDINGS: Normal bowel gas pattern. 21 mm stone projects over the right renal pelvis as seen on prior CT of abdomen and pelvis. Contrast accumulation within the urinary bladder. Moderate spondylosis of the lumbar spine with dextrocurvature. No acute osseous abnormality is identified. IMPRESSION: Normal bowel gas pattern. Large right kidney stone projecting over renal pelvis, stable from CT given differences in technique. Electronically Signed   By: Kristine Garbe M.D.   On: 04/24/2018 23:27   Ct Angio Chest Pe W And/or Wo Contrast  Result Date: 04/24/2018 CLINICAL DATA:  Fever and generalized weakness since last night. Shortness of breath. Status post chemotherapy and radiation therapy for right lung non-small cell lung cancer. EXAM: CT ANGIOGRAPHY CHEST WITH CONTRAST TECHNIQUE: Multidetector CT imaging of the chest was performed using the standard protocol during bolus administration of intravenous contrast. Multiplanar CT image reconstructions and MIPs were obtained to evaluate the vascular anatomy. CONTRAST:  15mL ISOVUE-370 IOPAMIDOL (ISOVUE-370) INJECTION 76% COMPARISON:  Chest radiographs obtained earlier today. Chest CT dated 02/06/2018. FINDINGS: Cardiovascular: Normally opacified pulmonary arteries with no pulmonary arterial filling defects seen. Atheromatous calcifications, including the coronary arteries and aorta. Mediastinum/Nodes: No enlarged mediastinal, hilar, or  axillary lymph nodes. No enlarged lymph nodes. Mild diffuse esophageal wall thickening without significant change. Unremarkable included portions of the thyroid gland. Lungs/Pleura: Small right pleural effusion. Stable bilateral bullous changes and diffuse peribronchial thickening. Mildly improved confluent and linear density in the medial aspect of the right lung with air bronchograms and cylindrical bronchiectasis. Upper Abdomen: Unremarkable. Musculoskeletal: Thoracic and lower cervical spine degenerative changes. Mild sternomanubrial joint degenerative changes and left sternoclavicular joint degenerative changes. Review of the MIP images confirms the above findings. IMPRESSION: 1. No pulmonary emboli. 2. Mildly improved confluent and linear density in the medial aspect of the right lung with air bronchograms and cylindrical bronchiectasis compatible with postradiation changes. 3. Interval small right pleural effusion. 4. Stable changes of COPD and chronic bronchitis. 5. Stable mild diffuse esophageal wall thickening, most likely due to radiation esophagitis or reflux esophagitis. 6.  Calcific coronary artery and aortic atherosclerosis. Aortic Atherosclerosis (ICD10-I70.0) and Emphysema (ICD10-J43.9). Electronically Signed   By: Claudie Revering M.D.   On: 04/24/2018 19:44    EKG: Independently reviewed.  Sinus rhythm, baseline wander in several leads.  QTc 524.  Assessment/Plan Principal Problem:   COPD exacerbation (HCC) Active Problems:   DM type 2 (diabetes mellitus, type 2) ([place])   Tobacco use   HTN (hypertension)   History of pulmonary embolus (PE)   Abdominal pain   Adenocarcinoma of right lung, stage 3 (HCC)   Diabetes mellitus due to underlying condition, uncontrolled (HCC)   Acute respiratory failure (HCC)   Hypothyroidism   Nephrolithiasis   Anorexia   Generalized weakness   Chronic anemia   CKD (chronic kidney disease) stage 3, GFR 30-59 ml/min (HCC)   Microscopic hematuria   HLD  (hyperlipidemia)   Acute hypoxic respiratory failure secondary to suspected COPD exacerbation, chronic bronchitis Per ED provider, noted to be hypoxic with minimal exertion in the ED.  SPO2 80% on room air on arrival.  Blood pressure intermittently soft.  Not tachycardic.  Currently satting well on 2 L oxygen via nasal cannula. Afebrile and no leukocytosis. Lactic acid normal x2. EKG not suggestive of ACS. CTA negative for PE. Showing mildly improved confluent and linear density in the medial aspect of the right lung with air bronchograms and  cylindrical bronchiectasis compatible with postradiation changes in the right lung, small right pleural effusion, stable changes of COPD, and chronic bronchitis. -Patient received Solu-Medrol 125 mg in the ED.  Continue prednisone 40 mg daily starting tomorrow morning. -Duo nebs every 6 hours -Azithromycin -Mucinex DM for cough -IV fluid hydration -Check troponin level -Supplemental oxygen -Blood culture x2 pending -Check procalcitonin level  Abdominal pain, anorexia Patient reports 3 to 46-month history of epigastric abdominal pain.  He has not been eating much.  Denies having any nausea, vomiting, or diarrhea.  Reports having regular bowel movements.  Denies having any dysphagia or odynophagia.  On exam, noted to have mild generalized abdominal tenderness.  No guarding or rebound.  LFTs normal.  Per chart review, he has lost approximately 2 kg since March 08, 2018. Chest CTA showing stable mild diffuse esophageal wall thickening, most likely due to radiation esophagitis or reflux esophagitis.  Unclear whether patient is taking PPI at home.  Abdominal x-ray showing normal bowel gas pattern. -Continue home Protonix 40 mg twice daily -Continue home Carafate -Continue home Bentyl -Nutrition consult -Continue home Glucerna shake  Generalized weakness Likely secondary to acute illness, stage III lung cancer, and hypothyroidism with history of Synthroid  nonadherence.  -PT evaluation -Check TSH level  Chronic anemia -Stable.  Hemoglobin 12.0, recent baseline 11.9-12.2.   CKD 3 -Creatinine 1.4, recent baseline 1.2-1.3. -Continue IV fluid hydration  Microscopic hematuria secondary to nephrolithiasis Urine dipstick with moderate amount of hemoglobin, microscopic examination showing >50 RBCs./ HPF.  UA not suggestive of UTI.  Abdominal x-ray showing large right kidney stone projecting over the right renal pelvis, stable from CT.  Per discharge summary from hospitalization in November 2019, patient's case was discussed with Dr. Jeffie Pollock who recommended outpatient follow-up for consideration of percutaneous nephrolithotomy. CT abdomen pelvis done on April 17, 2018 showing stable 2 cm right renal pelvic stone with mild chronic ectasia of the right renal collecting system and punctate 1 mm calcification suggested in the distal left ureter just proximal to the left UVJ without hydroureteronephrosis. -Monitor urinary output  -Patient will need urology follow-up.  Stage III adenocarcinoma of right lung Followed by Dr. Julien Nordmann from oncology.  Last office visit April 04, 2018 -immunotherapy was discontinued as it was challenging for the patient to come to the clinic for treatment.  Plan was to for the patient to follow-up in 4 weeks for repeat CT scan of the chest for restaging of his disease. -Patient will need outpatient oncology follow-up.  Tobacco use Patient continues to smoke 1.5 pack/day. -Discussed smoking cessation but he appears to be in the pre-contemplative stage at this time.  He declines using nicotine patch in the hospital.  History of DVT and PE -Continue home Eliquis  Hypertension -Currently not on home antihypertensives.  Blood pressure intermittently soft.  Hold off starting any blood pressure medications at this time.  Hyperlipidemia -Continue home Zetia, Crestor  Uncontrolled type 2 diabetes -A1c 8.1 on April 02, 2018. -Sliding scale insulin sensitive -CBG checks  Hypothyroidism TSH elevated at 54 on April 04, 2018 when checked by oncology clinic.  This was thought to be secondary to medication noncompliance. -Continue home Synthroid -Repeat TSH  Depression -Continue home Zoloft  DVT prophylaxis: Eliquis Code Status: Full code.  Discussed with patient and daughter at bedside. Family Communication: Daughter at bedside. Disposition Plan: Anticipate discharge to SNF in 1 to 2 days. Consults called: None Admission status: Observation   Shela Leff MD [facility] Pager [phone]  If 7PM-7AM, please contact night-coverage [email or web address] Password [email or web address]  04/25/2018, 1:25 AM

## 2018-04-24 NOTE — ED Provider Notes (Signed)
[facility] EMERGENCY DEPARTMENT Provider Note   CSN: [id number] Arrival date & time: 04/24/18  1301     History   Chief Complaint Chief Complaint  Patient presents with  . Fever    HPI Matthew Wilkins is a 72 y.o. male.  HPI Presents with concern of chill, fever, weakness, cough. Patient has multiple medical issues including lung cancer, recent diagnosis of pneumonia. He is not currently receiving chemotherapy, as he has been designated too weak for the treatment. He presents with a male companion who assists with the HPI. He notes ongoing baseline weakness, but new degree of discomfort over the past day or so, with generalized weakness, fever, chills, and worsening overall condition, including anorexia, nausea, but no vomiting, no diarrhea.   Past Medical History:  Diagnosis Date  . Adenocarcinoma of right lung, stage 3 ([place]) 11/17/2016  . Anxiety   . Arthritis   . Coronary artery disease   . Diabetes mellitus   . Diverticulitis   . DVT (deep venous thrombosis) ([place])   . Encounter for antineoplastic chemotherapy 11/17/2016  . GERD (gastroesophageal reflux disease)   . History of radiation therapy 11/28/16-01/06/17   right lung was treated to 60 Gy in 30 fractions  . Hypercholesterolemia   . Hypertension   . PAD (peripheral artery disease) ([place])   . SOB (shortness of breath)     Patient Active Problem List   Diagnosis Date Noted  . Right nephrolithiasis 04/03/2018  . RLL pneumonia ([place]) 04/03/2018  . CAP (community acquired pneumonia) 04/01/2018  . Reflux esophagitis 02/24/2018  . Esophageal ulcer without bleeding 02/24/2018  . Acute renal injury ([place]) 02/23/2018  . Diverticulitis of intestine 02/23/2018  . GERD (gastroesophageal reflux disease) 02/23/2018  . AKI (acute kidney injury) ([place]) 11/20/2017  . UTI (urinary tract infection) 11/20/2017  . Aphasia   . RUQ abdominal pain   . Hypothyroidism 07/27/2017  . HCAP (healthcare-associated pneumonia)  07/17/2017  . Diabetes mellitus due to underlying condition, uncontrolled ([place]) 07/17/2017  . Acute respiratory failure with hypoxia ([place]) 07/17/2017  . Encounter for antineoplastic immunotherapy 02/23/2017  . Adenocarcinoma of right lung, stage 3 ([place]) 11/17/2016  . Encounter for antineoplastic chemotherapy 11/17/2016  . Goals of care, counseling/discussion 11/17/2016  . Encounter for smoking cessation counseling 11/17/2016  . Left leg DVT ([place]) 08/31/2016  . Malnutrition of moderate degree 08/30/2016  . Thrombocytopenia ([place]) 08/29/2016  . History of pulmonary embolus (PE) 08/28/2016  . Pulmonary nodule 08/28/2016  . Hydronephrosis of right kidney 08/28/2016  . Chest pain 08/28/2016  . CAD (coronary artery disease)   . TIA (transient ischemic attack) 11/27/2011  . Research study patient 08/30/2011  . PAD (peripheral artery disease) ([place]) 06/30/2011  . PVD (peripheral vascular disease) ([place]) 01/10/2011  . COPD (chronic obstructive pulmonary disease) ([place]) 07/06/2010  . TOBACCO ABUSE 11/12/2008  . Coronary atherosclerosis 09/08/2008  . DM type 2 (diabetes mellitus, type 2) ([place]) 08/26/2008  . HYPERCHOLESTEROLEMIA  IIA 08/26/2008  . ANXIETY 08/26/2008  . PARKINSON'S DISEASE 08/26/2008  . Essential hypertension 08/26/2008  . ARTHRITIS 08/26/2008  . SHORTNESS OF BREATH 08/26/2008    Past Surgical History:  Procedure Laterality Date  . CARDIAC CATHETERIZATION N/A 06/10/2015   Procedure: Left Heart Cath and Coronary Angiography;  Surgeon: Burnell Blanks, MD;  Location: [facility] CV LAB;  Service: Cardiovascular;  Laterality: N/A;  . LOWER EXTREMITY ANGIOGRAM N/A 07/13/2011   Procedure: LOWER EXTREMITY ANGIOGRAM;  Surgeon: Burnell Blanks, MD;  Location: [facility] CATH LAB;  Service:  Cardiovascular;  Laterality: N/A;  . OTHER SURGICAL HISTORY    . OTHER SURGICAL HISTORY    . OTHER SURGICAL HISTORY    . PERCUTANEOUS STENT INTERVENTION Right 07/13/2011   Procedure:  PERCUTANEOUS STENT INTERVENTION;  Surgeon: Burnell Blanks, MD;  Location: [facility] CATH LAB;  Service: Cardiovascular;  Laterality: Right;        Home Medications    Prior to Admission medications   Medication Sig Start Date End Date Taking? Authorizing Provider  metFORMIN (GLUCOPHAGE-XR) 500 MG 24 hr tablet Take 2 tablets by mouth 2 (two) times daily. 04/19/16  Yes [provider]  sucralfate (CARAFATE) 1 GM/10ML suspension Take 10 mLs (1 g total) by mouth 4 (four) times daily -  with meals and at bedtime. 02/24/18  Yes Annita Brod, MD  albuterol (PROVENTIL HFA;VENTOLIN HFA) 108 (90 Base) MCG/ACT inhaler Inhale 1-2 puffs into the lungs every 6 (six) hours as needed for wheezing or shortness of breath.  04/21/17   [provider]  apixaban (ELIQUIS) 5 MG TABS tablet Take 1 tablet (5 mg total) by mouth 2 (two) times daily. 07/17/17   Dhungel, Flonnie Overman, MD  cefdinir (OMNICEF) 300 MG capsule Take 1 capsule (300 mg total) by mouth 2 (two) times daily. Patient not taking: Reported on 04/24/2018 04/03/18   Patrecia Pour, MD  dicyclomine (BENTYL) 20 MG tablet Take 1 tablet by mouth 3 (three) times daily. 10/26/17   [provider]  feeding supplement, GLUCERNA SHAKE, (GLUCERNA SHAKE) LIQD Take 237 mLs by mouth 3 (three) times daily between meals. 07/17/17   Dhungel, Nishant, MD  Fluticasone-Salmeterol (ADVAIR DISKUS) 250-50 MCG/DOSE AEPB Inhale 1 puff into the lungs every 12 (twelve) hours.  04/21/17   [provider]  gabapentin (NEURONTIN) 300 MG capsule Take 1 capsule by mouth 3 (three) times daily. 04/19/16   [provider]  glipiZIDE (GLUCOTROL XL) 5 MG 24 hr tablet Take 10 mg by mouth 2 (two) times daily.  03/11/15   [provider]  guaiFENesin (MUCINEX) 600 MG 12 hr tablet Take 1 tablet (600 mg total) by mouth 2 (two) times daily. 07/17/17 07/17/18  Dhungel, Nishant, MD  guaiFENesin-dextromethorphan (ROBITUSSIN DM) 100-10 MG/5ML syrup Take 5  mLs by mouth every 4 (four) hours as needed for cough. 07/17/17   Dhungel, Flonnie Overman, MD  HYDROcodone-acetaminophen (NORCO/VICODIN) 5-325 MG tablet 1 tab PO q12 hours prn pain 04/17/18   Francine Graven, DO  levothyroxine (SYNTHROID, LEVOTHROID) 100 MCG tablet TAKE 1 TABLET DAILY BEFORE BREAKFAST. 02/12/18   Maryanna Shape, NP  ondansetron (ZOFRAN) 4 MG tablet Take 1 tablet (4 mg total) by mouth every 6 (six) hours as needed. 02/21/18   Julianne Rice, MD  pantoprazole (PROTONIX) 40 MG tablet Take 1 tablet (40 mg total) by mouth 2 (two) times daily. 02/24/18   Annita Brod, MD  rosuvastatin (CRESTOR) 20 MG tablet Take 1 tablet (20 mg total) by mouth daily. 10/07/14   Burnell Blanks, MD  sertraline (ZOLOFT) 25 MG tablet Take 50 mg by mouth daily.  10/27/16   [provider]  ZETIA 10 MG tablet Take 10 mg by mouth daily. 04/03/15   [provider]    Family History Family History  Family history unknown: Yes    Social History Social History   Tobacco Use  . Smoking status: Current Every Day Smoker    Packs/day: 1.00    Years: 65.00    Pack years: 65.00    Types: Cigarettes  .  Smokeless tobacco: Never Used  Substance Use Topics  . Alcohol use: No  . Drug use: No     Allergies   Aspirin   Review of Systems Review of Systems  Constitutional:       Per HPI, otherwise negative  HENT:       Per HPI, otherwise negative  Respiratory:       Per HPI, otherwise negative  Cardiovascular:       Per HPI, otherwise negative  Gastrointestinal: Positive for nausea. Negative for vomiting.  Endocrine:       Negative aside from HPI  Genitourinary:       Neg aside from HPI   Musculoskeletal:       Per HPI, otherwise negative  Skin: Negative.   Allergic/Immunologic: Positive for immunocompromised state.  Neurological: Positive for weakness. Negative for syncope.     Physical Exam Updated Vital Signs BP 119/65   Pulse 82   Temp 98.4 F (36.9 C)  (Oral)   Resp 15   Ht 5\' 9"  (1.753 m)   Wt 72.6 kg   SpO2 100%   BMI 23.64 kg/m   Physical Exam  Constitutional: He is oriented to person, place, and time. He appears cachectic. He has a sickly appearance. No distress.  HENT:  Head: Normocephalic and atraumatic.  Eyes: Conjunctivae and EOM are normal.  Cardiovascular: Normal rate and regular rhythm.  Pulmonary/Chest: Tachypnea noted. He has decreased breath sounds.  Abdominal: He exhibits no distension.  Musculoskeletal: He exhibits no edema.  Neurological: He is alert and oriented to person, place, and time.  Skin: Skin is warm and dry.  Psychiatric: He has a normal mood and affect.  Nursing note and vitals reviewed.    ED Treatments / Results  Labs (all labs ordered are listed, but only abnormal results are displayed) Labs Reviewed  COMPREHENSIVE METABOLIC PANEL - Abnormal; Notable for the following components:      Result Value   Sodium 133 (*)    Glucose, Bld 198 (*)    Creatinine, Ser 1.41 (*)    Calcium 8.7 (*)    GFR calc non Af Amer 49 (*)    GFR calc Af Amer 57 (*)    All other components within normal limits  CBC WITH DIFFERENTIAL/PLATELET - Abnormal; Notable for the following components:   Hemoglobin 12.0 (*)    HCT 37.1 (*)    Lymphs Abs 0.6 (*)    All other components within normal limits  URINALYSIS, ROUTINE W REFLEX MICROSCOPIC - Abnormal; Notable for the following components:   Hgb urine dipstick MODERATE (*)    RBC / HPF >50 (*)    Bacteria, UA RARE (*)    All other components within normal limits  CULTURE, BLOOD (ROUTINE X 2)  CULTURE, BLOOD (ROUTINE X 2)  PROTIME-INR  LACTIC ACID, PLASMA  LACTIC ACID, PLASMA    EKG None  Radiology Dg Chest 2 View  Result Date: 04/24/2018 CLINICAL DATA:  History of adenocarcinoma right lung. Radiation therapy. EXAM: CHEST - 2 VIEW COMPARISON:  04/17/2018. FINDINGS: Mediastinum and hilar structures are normal. Heart size normal. COPD. Findings consistent  atelectasis and scarring right mid lung field. This most likely secondary to radiation therapy. Similar findings noted on prior exam. No acute abnormality identified. No pleural effusion or pneumothorax IMPRESSION: Right mid lung field/scarring consistent with prior radiation therapy. Similar findings noted on prior exam. No acute abnormality. Electronically Signed   By: Marcello Moores  Register   On: 04/24/2018 14:35  Ct Angio Chest Pe W And/or Wo Contrast  Result Date: 04/24/2018 CLINICAL DATA:  Fever and generalized weakness since last night. Shortness of breath. Status post chemotherapy and radiation therapy for right lung non-small cell lung cancer. EXAM: CT ANGIOGRAPHY CHEST WITH CONTRAST TECHNIQUE: Multidetector CT imaging of the chest was performed using the standard protocol during bolus administration of intravenous contrast. Multiplanar CT image reconstructions and MIPs were obtained to evaluate the vascular anatomy. CONTRAST:  46mL ISOVUE-370 IOPAMIDOL (ISOVUE-370) INJECTION 76% COMPARISON:  Chest radiographs obtained earlier today. Chest CT dated 02/06/2018. FINDINGS: Cardiovascular: Normally opacified pulmonary arteries with no pulmonary arterial filling defects seen. Atheromatous calcifications, including the coronary arteries and aorta. Mediastinum/Nodes: No enlarged mediastinal, hilar, or axillary lymph nodes. No enlarged lymph nodes. Mild diffuse esophageal wall thickening without significant change. Unremarkable included portions of the thyroid gland. Lungs/Pleura: Small right pleural effusion. Stable bilateral bullous changes and diffuse peribronchial thickening. Mildly improved confluent and linear density in the medial aspect of the right lung with air bronchograms and cylindrical bronchiectasis. Upper Abdomen: Unremarkable. Musculoskeletal: Thoracic and lower cervical spine degenerative changes. Mild sternomanubrial joint degenerative changes and left sternoclavicular joint degenerative changes.  Review of the MIP images confirms the above findings. IMPRESSION: 1. No pulmonary emboli. 2. Mildly improved confluent and linear density in the medial aspect of the right lung with air bronchograms and cylindrical bronchiectasis compatible with postradiation changes. 3. Interval small right pleural effusion. 4. Stable changes of COPD and chronic bronchitis. 5. Stable mild diffuse esophageal wall thickening, most likely due to radiation esophagitis or reflux esophagitis. 6.  Calcific coronary artery and aortic atherosclerosis. Aortic Atherosclerosis (ICD10-I70.0) and Emphysema (ICD10-J43.9). Electronically Signed   By: Claudie Revering M.D.   On: 04/24/2018 19:44    Procedures Procedures (including critical care time)  Medications Ordered in ED Medications  methylPREDNISolone sodium succinate (SOLU-MEDROL) 125 mg/2 mL injection 125 mg (has no administration in time range)  sodium chloride 0.9 % bolus 1,000 mL (0 mLs Intravenous Stopped 04/24/18 1713)  albuterol (PROVENTIL) (2.5 MG/3ML) 0.083% nebulizer solution 2.5 mg (2.5 mg Nebulization Given 04/24/18 1734)  iopamidol (ISOVUE-370) 76 % injection 100 mL (80 mLs Intravenous Contrast Given 04/24/18 1859)     Initial Impression / Assessment and Plan / ED Course  I have reviewed the triage vital signs and the nursing notes.  Pertinent labs & imaging results that were available during my care of the patient were reviewed by me and considered in my medical decision making (see chart for details).     8:08 PM After 1 breathing treatment, steroids, patient has had CT angiography, without pulmonary embolism, but has persistently increased work of breathing, with quick desaturation with minimal amounts of motion. Patient requires 2 L nasal cannula to maintain ongoing appropriate saturation. No evidence for pneumonia, and his malignancy does not appear appreciably different. However, with persistent increased work of breathing, hypoxia with minimal  activity, no home oxygen, concern for COPD exacerbation, the patient will be admitted for further evaluation, monitoring, management including ongoing nebulizer therapy.   Final Clinical Impressions(s) / ED Diagnoses  COPD exacerbation   Carmin Muskrat, MD 04/24/18 2121

## 2018-04-25 ENCOUNTER — Other Ambulatory Visit: Payer: Self-pay

## 2018-04-25 ENCOUNTER — Encounter (HOSPITAL_COMMUNITY): Payer: Self-pay

## 2018-04-25 DIAGNOSIS — E785 Hyperlipidemia, unspecified: Secondary | ICD-10-CM | POA: Diagnosis not present

## 2018-04-25 DIAGNOSIS — J441 Chronic obstructive pulmonary disease with (acute) exacerbation: Principal | ICD-10-CM

## 2018-04-25 DIAGNOSIS — Z720 Tobacco use: Secondary | ICD-10-CM

## 2018-04-25 DIAGNOSIS — I10 Essential (primary) hypertension: Secondary | ICD-10-CM | POA: Diagnosis not present

## 2018-04-25 DIAGNOSIS — Z86711 Personal history of pulmonary embolism: Secondary | ICD-10-CM | POA: Diagnosis not present

## 2018-04-25 DIAGNOSIS — R531 Weakness: Secondary | ICD-10-CM

## 2018-04-25 DIAGNOSIS — E039 Hypothyroidism, unspecified: Secondary | ICD-10-CM

## 2018-04-25 DIAGNOSIS — C3491 Malignant neoplasm of unspecified part of right bronchus or lung: Secondary | ICD-10-CM | POA: Diagnosis not present

## 2018-04-25 DIAGNOSIS — D649 Anemia, unspecified: Secondary | ICD-10-CM | POA: Diagnosis not present

## 2018-04-25 DIAGNOSIS — N183 Chronic kidney disease, stage 3 unspecified: Secondary | ICD-10-CM

## 2018-04-25 DIAGNOSIS — E1121 Type 2 diabetes mellitus with diabetic nephropathy: Secondary | ICD-10-CM | POA: Diagnosis not present

## 2018-04-25 DIAGNOSIS — R3129 Other microscopic hematuria: Secondary | ICD-10-CM

## 2018-04-25 DIAGNOSIS — R630 Anorexia: Secondary | ICD-10-CM

## 2018-04-25 LAB — GLUCOSE, CAPILLARY
Glucose-Capillary: 273 mg/dL — ABNORMAL HIGH (ref 70–99)
Glucose-Capillary: 372 mg/dL — ABNORMAL HIGH (ref 70–99)
Glucose-Capillary: 411 mg/dL — ABNORMAL HIGH (ref 70–99)
Glucose-Capillary: 457 mg/dL — ABNORMAL HIGH (ref 70–99)

## 2018-04-25 LAB — TSH: TSH: 59.082 u[IU]/mL — ABNORMAL HIGH (ref 0.350–4.500)

## 2018-04-25 LAB — TROPONIN I: Troponin I: 0 ng/mL (ref <0.03)

## 2018-04-25 MED ORDER — GUAIFENESIN-DM 100-10 MG/5ML PO SYRP
5.0000 mL | ORAL_SOLUTION | ORAL | Status: DC | PRN
  Administered 2018-04-25: 5 mL via ORAL
  Filled 2018-04-25: qty 5

## 2018-04-25 MED ORDER — INSULIN ASPART 100 UNIT/ML ~~LOC~~ SOLN
20.0000 [IU] | Freq: Once | SUBCUTANEOUS | Status: AC
  Administered 2018-04-25: 20 [IU] via SUBCUTANEOUS

## 2018-04-25 MED ORDER — SODIUM CHLORIDE 0.9 % IV SOLN
INTRAVENOUS | Status: AC
  Administered 2018-04-25: 03:00:00 via INTRAVENOUS

## 2018-04-25 MED ORDER — PREDNISONE 20 MG PO TABS: ORAL_TABLET | ORAL | 0 refills | Status: DC

## 2018-04-25 MED ORDER — PREDNISONE 20 MG PO TABS
60.0000 mg | ORAL_TABLET | Freq: Every day | ORAL | Status: DC
  Administered 2018-04-25: 60 mg via ORAL
  Filled 2018-04-25: qty 3

## 2018-04-25 MED ORDER — DOXYCYCLINE HYCLATE 100 MG PO TABS: 100.0000 mg | ORAL_TABLET | Freq: Two times a day (BID) | ORAL | 0 refills | Status: AC

## 2018-04-25 MED ORDER — INSULIN ASPART 100 UNIT/ML ~~LOC~~ SOLN
15.0000 [IU] | Freq: Once | SUBCUTANEOUS | Status: AC
  Administered 2018-04-25: 15 [IU] via SUBCUTANEOUS

## 2018-04-25 MED ORDER — TIOTROPIUM BROMIDE MONOHYDRATE 18 MCG IN CAPS: 18.0000 ug | ORAL_CAPSULE | Freq: Every day | RESPIRATORY_TRACT | 2 refills | Status: AC

## 2018-04-25 MED ORDER — MOMETASONE FURO-FORMOTEROL FUM 200-5 MCG/ACT IN AERO
2.0000 | INHALATION_SPRAY | Freq: Two times a day (BID) | RESPIRATORY_TRACT | Status: DC
  Administered 2018-04-25: 2 via RESPIRATORY_TRACT
  Filled 2018-04-25: qty 8.8

## 2018-04-25 NOTE — Evaluation (Signed)
Physical Therapy Evaluation Patient Details Name: Matthew Wilkins MRN: [id number] DOB: 1945-06-19 Today's Date: 04/25/2018   History of Present Illness  Matthew Wilkins is a 72 y.o. male with medical history significant of stage III adenocarcinoma of right lung, anxiety, CAD, type 2 diabetes, DVT and PE on anticoagulation with apixaban, hypertension, hyperlipidemia, recently admitted in November 2019 for HCAP presenting to the hospital for evaluation of generalized weakness and shortness of breath.  Patient is a poor historian and it was difficult to obtain a thorough history from him.  Daughter at bedside states that patient has been having generalized weakness and shortness of breath since his prior hospitalization for pneumonia.  He has been coughing and not eating much.  He has been having chills.  Patient reports using his COPD inhalers but daughter at bedside does not seem to agree.  Patient is not sure if he has been wheezing.  Denies having any chest pain.  Reports having epigastric abdominal pain for the past 3 to 4 months.  Denies having any nausea, vomiting, or diarrhea.  States he is having regular bowel movements.  Denies having any dysphagia or odynophagia.  States he continues to smoke 1.5 pack/day and does not want to quit.  He does not want to use a nicotine patch in the hospital.  Daughter at bedside is concerned that the patient lives alone at home and would like for him to go to a rehab facility.      Clinical Impression  Patient demonstrates slow labored movement for sitting up at bedside requiring use of bed rail, slightly impulsive behavior, unsteady labored cadence ambulating with RW, limited mostly due to fatigue and tolerated sitting up in chair after therapy - nursing staff notified.  Patient will benefit from continued physical therapy in hospital and recommended venue below to increase strength, balance, endurance for safe ADLs and gait.    Follow Up Recommendations  SNF;Supervision - Intermittent;Supervision for mobility/OOB    Equipment Recommendations  None recommended by PT    Recommendations for Other Services       Precautions / Restrictions Precautions Precautions: Fall Restrictions Weight Bearing Restrictions: No      Mobility  Bed Mobility Overal bed mobility: Needs Assistance Bed Mobility: Supine to Sit     Supine to sit: Supervision     General bed mobility comments: slow labored movement with use of bed rail and head of bed raised  Transfers                    Ambulation/Gait Ambulation/Gait assistance: Min assist Gait Distance (Feet): 45 Feet Assistive device: Rolling walker (2 wheeled) Gait Pattern/deviations: Decreased step length - right;Decreased step length - left;Decreased stride length Gait velocity: decreased   General Gait Details: slow labored slightly unsteady cadence, limited mostly due to fatigue  Stairs            Wheelchair Mobility    Modified Rankin (Stroke Patients Only)       Balance Overall balance assessment: Needs assistance Sitting-balance support: Feet supported;No upper extremity supported Sitting balance-Leahy Scale: Good     Standing balance support: Bilateral upper extremity supported;During functional activity Standing balance-Leahy Scale: Fair Standing balance comment: using RW                             Pertinent Vitals/Pain Pain Assessment: No/denies pain    Home Living Family/patient expects to be discharged to:: Private residence Living  Arrangements: Alone Available Help at Discharge: Family Type of Home: House Home Access: Level entry     Home Layout: One level Home Equipment: Cane - single point;Walker - 4 wheels;Wheelchair - [employer];Hospital bed;Shower seat;Cane - quad;Wheelchair - power      Prior Function Level of Independence: Independent with assistive device(s)         Comments: household ambulator with  RW, cane PRN     Hand Dominance        Extremity/Trunk Assessment   Upper Extremity Assessment Upper Extremity Assessment: Generalized weakness    Lower Extremity Assessment Lower Extremity Assessment: Generalized weakness    Cervical / Trunk Assessment Cervical / Trunk Assessment: Kyphotic  Communication   Communication: No difficulties  Cognition Arousal/Alertness: Awake/alert Behavior During Therapy: WFL for tasks assessed/performed Overall Cognitive Status: Within Functional Limits for tasks assessed                                        General Comments      Exercises     Assessment/Plan    PT Assessment Patient needs continued PT services  PT Problem List Decreased strength;Decreased activity tolerance;Decreased balance;Decreased mobility       PT Treatment Interventions Gait training;Functional mobility training;Therapeutic activities;Therapeutic exercise;Stair training;Patient/family education    PT Goals (Current goals can be found in the Care Plan section)  Acute Rehab PT Goals Patient Stated Goal: return home with family to assist PT Goal Formulation: With patient Time For Goal Achievement: 05/09/18 Potential to Achieve Goals: Good    Frequency Min 3X/week   Barriers to discharge        Co-evaluation               AM-PAC PT "6 Clicks" Mobility  Outcome Measure Help needed turning from your back to your side while in a flat bed without using bedrails?: None Help needed moving from lying on your back to sitting on the side of a flat bed without using bedrails?: A Little Help needed moving to and from a bed to a chair (including a wheelchair)?: A Lot Help needed standing up from a chair using your arms (e.g., wheelchair or bedside chair)?: A Little Help needed to walk in hospital room?: A Little Help needed climbing 3-5 steps with a railing? : A Lot 6 Click Score: 17    End of Session   Activity Tolerance: Patient  tolerated treatment well;Patient limited by fatigue Patient left: in chair;with call bell/phone within reach;with chair alarm set Nurse Communication: Mobility status PT Visit Diagnosis: Unsteadiness on feet (R26.81);Other abnormalities of gait and mobility (R26.89);Muscle weakness (generalized) (M62.81)    Time: 4193-7902 PT Time Calculation (min) (ACUTE ONLY): 25 min   Charges:   PT Evaluation $PT Eval Moderate Complexity: 1 Mod PT Treatments $Therapeutic Activity: 23-37 mins        12:08 PM, 04/25/18 Lonell Grandchild, MPT Physical Therapist with [facility] [phone] office [phone] mobile phone

## 2018-04-25 NOTE — Care Management Note (Signed)
Case Management Note  Patient Details  Name: Matthew Wilkins MRN: [id number] Date of Birth: 23-Nov-1945  Subjective/Objective:    Pt observation for COPD. Pt has insurance and PCP. Daughter, Matthew Wilkins is at the bedside. Pt lives alone, and per daughter "does not take care of himself like he should". Pt has had 4 admissions and 2 ED visits in past 6 months. Pt has refused all HH in the past. Pt refused [facility] services after last discharge. Pt's reason for refusal is his home condition and having dogs. Pt will not consider putting dogs in another room while [facility] provider visits. PT made recommendation for SNF. CM discussed placement process including authorization which could take a few days to get back. Pt says he will not stay here that long. Pt seems confused about different DC options, SNF-HH-OP-nothing. Patient concerned about being able to smoke with any of the DC options. Daughter at bedside understands and will contact CM back about OP PT. Daughter is supportive of dads decision to refuse (because she knows she will not be able to change his mind).               Action/Plan: Anticipate DC home with potential referral for OP PT. CM will cont to follow. DC in next 24 hrs.   Expected Discharge Date:      04/26/18            Expected Discharge Plan:  Home/Self Care  In-House Referral:  NA  Discharge planning Services  CM Consult  Post Acute Care Choice:  NA Choice offered to:  NA  Status of Service:  Completed, signed off  Sherald Barge, RN 04/25/2018, 1:03 PM

## 2018-04-25 NOTE — Plan of Care (Signed)
  Problem: Acute Rehab PT Goals(only PT should resolve) Goal: Pt Will Go Supine/Side To Sit Outcome: Progressing Flowsheets (Taken 04/25/2018 1210) Pt will go Supine/Side to Sit: with modified independence Goal: Patient Will Transfer Sit To/From Stand Outcome: Progressing Flowsheets (Taken 04/25/2018 1210) Patient will transfer sit to/from stand: with supervision Goal: Pt Will Transfer Bed To Chair/Chair To Bed Outcome: Progressing Flowsheets (Taken 04/25/2018 1210) Pt will Transfer Bed to Chair/Chair to Bed: with supervision Goal: Pt Will Ambulate Outcome: Progressing Flowsheets (Taken 04/25/2018 1210) Pt will Ambulate: 75 feet; with supervision; with rolling walker Note:  Or use quad-cane if patient is safe   12:11 PM, 04/25/18 Lonell Grandchild, MPT Physical Therapist with [facility] [phone] office [phone] mobile phone

## 2018-04-25 NOTE — Progress Notes (Signed)
SATURATION QUALIFICATIONS: (This note is used to comply with regulatory documentation for home oxygen)  Patient Saturations on Room Air at Rest =95%  Patient Saturations on Room Air while Ambulating = 95%  Patient Saturations on 0 Liters of oxygen while Ambulating  n/a  Please briefly explain why patient needs home oxygen:

## 2018-04-25 NOTE — Care Management Obs Status (Signed)
[facility] NOTIFICATION   Patient Details  Name: Matthew Wilkins MRN: [id number] Date of Birth: [birth year]   Medicare Observation Status Notification Given:  Yes    Sherald Barge, RN 04/25/2018, 1:03 PM

## 2018-04-25 NOTE — Discharge Summary (Addendum)
Physician Discharge Summary  Matthew Wilkins KCL:[id number] DOB: 08-18-1945 DOA: 04/24/2018  PCP: Matthew Sacramento, MD  Admit date: 04/24/2018 Discharge date: 04/25/2018  Time spent: 35 minutes  Recommendations for Outpatient Follow-up:  Repeat basic metabolic panel to follow electrolytes and renal function Patient will benefit outpatient follow-up with pulmonary service to repeat PFTs and further adjust COPD maintenance regimen. Continue assisting with tobacco cessation  Discharge Diagnoses:  Principal Problem:   COPD exacerbation ([place]) Active Problems:   DM type 2 (diabetes mellitus, type 2) (HCC)   Tobacco use   HTN (hypertension)   History of pulmonary embolus (PE)   Abdominal pain   Adenocarcinoma of right lung, stage 3 (HCC)   Diabetes mellitus due to underlying condition, uncontrolled ([place])   Acute respiratory failure (HCC)   Hypothyroidism   Nephrolithiasis   Anorexia   Generalized weakness   Chronic anemia   CKD (chronic kidney disease) stage 3, GFR 30-59 ml/min (HCC)   Microscopic hematuria   HLD (hyperlipidemia)   Discharge Condition: Stable and improved.  Patient discharged home with instruction to follow-up with PCP in 10 days.  Diet recommendation: Heart healthy and modified carbohydrate diet.  Filed Weights   04/24/18 1323  Weight: 72.6 kg    History of present illness:  As per H&P written by Dr. Marlowe Wilkins 72 y.o. male with medical history significant of stage III adenocarcinoma of right lung, anxiety, CAD, type 2 diabetes, DVT and PE on anticoagulation with apixaban, hypertension, hyperlipidemia, recently admitted in November 2019 for HCAP presenting to the hospital for evaluation of generalized weakness and shortness of breath.  Patient is a poor historian and it was difficult to obtain a thorough history from him.  Daughter at bedside states that patient has been having generalized weakness and shortness of breath since his prior hospitalization for  pneumonia.  He has been coughing and not eating much.  He has been having chills.  Patient reports using his COPD inhalers but daughter at bedside does not seem to agree.  Patient is not sure if he has been wheezing.  Denies having any chest pain.  Reports having epigastric abdominal pain for the past 3 to 4 months.  Denies having any nausea, vomiting, or diarrhea.  States he is having regular bowel movements.  Denies having any dysphagia or odynophagia.  States he continues to smoke 1.5 pack/day and does not want to quit.  He does not want to use a nicotine patch in the hospital.  Daughter at bedside is concerned that the patient lives alone at home and would like for him to go to a rehab facility.    ED Course: On arrival-afebrile, blood pressure 94/55, SPO2 80% on room air, tachypneic.  No leukocytosis.  Lactic acid normal x2.  LFTs normal.  EKG not suggestive of ACS.  Chest x-ray showing right midlung field scarring consistent with prior radiation therapy; no acute abnormality.  Chest CTA negative for PE.  CTA showing postradiation changes in the right lung, small right pleural effusion, stable changes of COPD and chronic bronchitis.  Also showing stable mild diffuse esophageal wall thickening, most likely due to radiation esophagitis or reflux esophagitis.  Hospital Course:  1-shortness of breath, productive cough: In the setting of COPD exacerbation and bronchiectasis. -Patient with remarkable improvement to IV steroids and nebulizer treatments -Will discharge home on oral steroids tapering, daily Spiriva, resumption of as needed albuterol inhaler and twice a day Advair. -Continue Mucinex, flutter valve and complete treatment with oral antibiotics. -No  oxygen supplementation needed during assessment at rest or exertion. -Patient will benefit of outpatient follow-up with pulmonary service for repeat PFTs and further adjustment on his COPD maintenance treatment.  2-GERD/IBS -Continue PPI twice a  day -Resume the use of Carafate -Continue Bentyl  3-generalized weakness -Most likely secondary to deconditioning in the setting of stage III lung cancer, hypothyroidism and acute COPD exacerbation -Continue Synthroid -Physical therapy has recommended short-term rehabilitation or at the very least home health services; both has been declined by the patient -He will reevaluate and consider outpatient physical therapy. -Appreciate assistance by case manager  4-chronic kidney disease stage III -Creatinine 1.3 at baseline time of discharge -Advised to keep himself well-hydrated -Continue avoiding nephrotoxic agents as much as possible.  5-stage III adenocarcinoma of the right lung -Continue outpatient follow-up with oncology service -Patient follow with Dr. Earlie Wilkins (last office visit April 04, 2018.  6-tobacco abuse -Patient continues to smoke 1.5 pack/day -I have discussed tobacco cessation with the patient.  I have counseled the patient regarding the negative impacts of continued tobacco use including but not limited to lung cancer, COPD, and cardiovascular disease.  I have discussed alternatives to tobacco and modalities that may help facilitate tobacco cessation including but not limited to biofeedback, hypnosis, and medications.  Total time spent with tobacco counseling was 5 minutes -nicotine patch used during hospitalization  7-essential hypertension -Blood pressure has remained stable -Patient is currently no using any antihypertensive agent at home -Continue heart healthy diet -Reassess vital signs at follow-up visit and determine if needed antihypertensive regimen to be started.  8-hyperlipidemia -Continue Crestor and Zetia -Healthy diet has been advised.  9-hypothyroidism -Will continue Synthroid  10-depression -Continue Zoloft -No suicidal ideation or hallucinations  11-uncontrolled type 2 diabetes with nephropathy -Last A1c November 2019 was 8.1 -Will resume  home oral hypoglycemic agents -Follow-up with PCP to further reduce medication regimen for his diabetes.  12-hx of DVT and PE -continue Eliquis  13-RLS -continue requip  Procedures:  See below for x-ray report  Consultations:  None  Discharge Exam: Vitals:   04/25/18 1355 04/25/18 1408  BP: 134/67   Pulse: 87   Resp: 18   Temp: (!) 97.5 F (36.4 C)   SpO2: 95% 99%    General: Afebrile, in no acute distress; speaking in full sentences and with good oxygen saturation on room air.  Patient reports feeling weak but has also declined home health services or short-term rehabilitation in a skilled nursing home. Cardiovascular: S1 and S2, no rubs, no gallops, no JVD Respiratory: Good air movement bilaterally, positive scattered rhonchi, no expiratory wheezing on exam.  Normal respiratory effort and not use of accessory muscles appreciated Abdomen: Soft, nontender, nondistended, positive bowel sounds Extremities: No edema, no cyanosis or clubbing.  Discharge Instructions   Discharge Instructions    Diet - low sodium heart healthy   Complete by:  As directed    Diet Carb Modified   Complete by:  As directed    Discharge instructions   Complete by:  As directed    Take medications as prescribed Stop smoking and avoid secondhand smoking Maintain adequate hydration Arrange follow-up with PCP in 10 days Please make sure to follow-up with outpatient physical therapy as instructed for further recommendation and assistance in your conditioning process.     Allergies as of 04/25/2018      Reactions   Aspirin Other (See Comments)   Nose bleeds       Medication List    STOP  taking these medications   cefdinir 300 MG capsule Commonly known as:  OMNICEF   guaiFENesin 600 MG 12 hr tablet Commonly known as:  MUCINEX     TAKE these medications   ADVAIR DISKUS 250-50 MCG/DOSE Aepb Generic drug:  Fluticasone-Salmeterol Inhale 1 puff into the lungs every 12 (twelve) hours.    albuterol 108 (90 Base) MCG/ACT inhaler Commonly known as:  PROVENTIL HFA;VENTOLIN HFA Inhale 1-2 puffs into the lungs every 6 (six) hours as needed for wheezing or shortness of breath.   apixaban 5 MG Tabs tablet Commonly known as:  ELIQUIS Take 1 tablet (5 mg total) by mouth 2 (two) times daily.   dicyclomine 20 MG tablet Commonly known as:  BENTYL Take 1 tablet by mouth 3 (three) times daily.   doxycycline 100 MG tablet Commonly known as:  VIBRA-TABS Take 1 tablet (100 mg total) by mouth 2 (two) times daily for 10 days.   gabapentin 400 MG capsule Commonly known as:  NEURONTIN Take 400 mg by mouth 3 (three) times daily.   glipiZIDE 5 MG 24 hr tablet Commonly known as:  GLUCOTROL XL Take 5 mg by mouth daily with breakfast.   HYDROcodone-acetaminophen 5-325 MG tablet Commonly known as:  NORCO/VICODIN 1 tab PO q12 hours prn pain   lisinopril 5 MG tablet Commonly known as:  PRINIVIL,ZESTRIL Take 5 mg by mouth daily.   metFORMIN 500 MG 24 hr tablet Commonly known as:  GLUCOPHAGE-XR Take 2 tablets by mouth 2 (two) times daily.   omeprazole 40 MG capsule Commonly known as:  PRILOSEC Take 40 mg by mouth daily.   ondansetron 4 MG tablet Commonly known as:  ZOFRAN Take 1 tablet (4 mg total) by mouth every 6 (six) hours as needed.   predniSONE 20 MG tablet Commonly known as:  DELTASONE Take 2 tablets by mouth daily x2 days; then 1 tablet by mouth daily x3 days; then half tablet by mouth daily x3 days and stop prednisone.   rOPINIRole 3 MG tablet Commonly known as:  REQUIP Take 3 mg by mouth 4 (four) times daily.   rosuvastatin 20 MG tablet Commonly known as:  CRESTOR Take 1 tablet (20 mg total) by mouth daily.   sertraline 50 MG tablet Commonly known as:  ZOLOFT Take 50 mg by mouth daily.   sucralfate 1 GM/10ML suspension Commonly known as:  CARAFATE Take 10 mLs (1 g total) by mouth 4 (four) times daily -  with meals and at bedtime.   tiotropium 18 MCG  inhalation capsule Commonly known as:  SPIRIVA Place 1 capsule (18 mcg total) into inhaler and inhale daily.   ZETIA 10 MG tablet Generic drug:  ezetimibe Take 10 mg by mouth daily.            Durable Medical Equipment  (From admission, onward)         Start     Ordered   04/25/18 0835  For home use only DME oxygen  Once    Question:  Oxygen delivery system  Answer:  Gas   04/25/18 0837         Allergies  Allergen Reactions  . Aspirin Other (See Comments)    Nose bleeds    Follow-up Information    Matthew Sacramento, MD. Schedule an appointment as soon as possible for a visit in 10 day(s).   Specialty:  Family Medicine Contact information: [address] [place] [address] [phone]  The results of significant diagnostics from this hospitalization (including imaging, microbiology, ancillary and laboratory) are listed below for reference.    Significant Diagnostic Studies: Dg Chest 2 View  Result Date: 04/24/2018 CLINICAL DATA:  History of adenocarcinoma right lung. Radiation therapy. EXAM: CHEST - 2 VIEW COMPARISON:  04/17/2018. FINDINGS: Mediastinum and hilar structures are normal. Heart size normal. COPD. Findings consistent atelectasis and scarring right mid lung field. This most likely secondary to radiation therapy. Similar findings noted on prior exam. No acute abnormality identified. No pleural effusion or pneumothorax IMPRESSION: Right mid lung field/scarring consistent with prior radiation therapy. Similar findings noted on prior exam. No acute abnormality. Electronically Signed   By: Marcello Moores  Register   On: 04/24/2018 14:35   Dg Chest 2 View  Result Date: 04/17/2018 CLINICAL DATA:  Chest pain with shortness of breath. EXAM: CHEST - 2 VIEW COMPARISON:  Chest x-rays dated 04/01/2018, 02/21/2018 and 11/19/2017. Chest CT dated 02/06/2018. FINDINGS: Heart size and mediastinal contours are stable. Again noted are post radiation changes within the  RIGHT perihilar lung to the RIGHT lung base. No new lung findings. No pleural effusion or pneumothorax seen. No acute or suspicious osseous finding. IMPRESSION: No active cardiopulmonary disease. No evidence of pneumonia or pulmonary edema. Electronically Signed   By: Franki Cabot M.D.   On: 04/17/2018 14:30   Dg Chest 2 View  Result Date: 04/01/2018 CLINICAL DATA:  Weakness EXAM: CHEST - 2 VIEW COMPARISON:  02/21/2018 FINDINGS: Heart is normal size. Patchy airspace disease in the right middle and lower lobe lobes are similar prior study and likely reflect radiation changes. No confluent opacity on the left. No effusions or acute bony abnormality. IMPRESSION: Postradiation changes in the right middle and lower lobes, stable since prior study. No definite acute process. Electronically Signed   By: Rolm Baptise M.D.   On: 04/01/2018 19:36   Dg Abd 1 View  Result Date: 04/24/2018 CLINICAL DATA:  72 y/o  M; nausea and abdominal pain. EXAM: ABDOMEN - 1 VIEW COMPARISON:  04/17/2018 CT abdomen and pelvis. FINDINGS: Normal bowel gas pattern. 21 mm stone projects over the right renal pelvis as seen on prior CT of abdomen and pelvis. Contrast accumulation within the urinary bladder. Moderate spondylosis of the lumbar spine with dextrocurvature. No acute osseous abnormality is identified. IMPRESSION: Normal bowel gas pattern. Large right kidney stone projecting over renal pelvis, stable from CT given differences in technique. Electronically Signed   By: Kristine Garbe M.D.   On: 04/24/2018 23:27   Ct Angio Chest Pe W And/or Wo Contrast  Result Date: 04/24/2018 CLINICAL DATA:  Fever and generalized weakness since last night. Shortness of breath. Status post chemotherapy and radiation therapy for right lung non-small cell lung cancer. EXAM: CT ANGIOGRAPHY CHEST WITH CONTRAST TECHNIQUE: Multidetector CT imaging of the chest was performed using the standard protocol during bolus administration of  intravenous contrast. Multiplanar CT image reconstructions and MIPs were obtained to evaluate the vascular anatomy. CONTRAST:  54mL ISOVUE-370 IOPAMIDOL (ISOVUE-370) INJECTION 76% COMPARISON:  Chest radiographs obtained earlier today. Chest CT dated 02/06/2018. FINDINGS: Cardiovascular: Normally opacified pulmonary arteries with no pulmonary arterial filling defects seen. Atheromatous calcifications, including the coronary arteries and aorta. Mediastinum/Nodes: No enlarged mediastinal, hilar, or axillary lymph nodes. No enlarged lymph nodes. Mild diffuse esophageal wall thickening without significant change. Unremarkable included portions of the thyroid gland. Lungs/Pleura: Small right pleural effusion. Stable bilateral bullous changes and diffuse peribronchial thickening. Mildly improved confluent and linear density in the medial aspect  of the right lung with air bronchograms and cylindrical bronchiectasis. Upper Abdomen: Unremarkable. Musculoskeletal: Thoracic and lower cervical spine degenerative changes. Mild sternomanubrial joint degenerative changes and left sternoclavicular joint degenerative changes. Review of the MIP images confirms the above findings. IMPRESSION: 1. No pulmonary emboli. 2. Mildly improved confluent and linear density in the medial aspect of the right lung with air bronchograms and cylindrical bronchiectasis compatible with postradiation changes. 3. Interval small right pleural effusion. 4. Stable changes of COPD and chronic bronchitis. 5. Stable mild diffuse esophageal wall thickening, most likely due to radiation esophagitis or reflux esophagitis. 6.  Calcific coronary artery and aortic atherosclerosis. Aortic Atherosclerosis (ICD10-I70.0) and Emphysema (ICD10-J43.9). Electronically Signed   By: Claudie Revering M.D.   On: 04/24/2018 19:44   Ct Abdomen Pelvis W Contrast  Result Date: 04/17/2018 CLINICAL DATA:  Generalized abdominal pain since last evening. History of right lung cancer  with chemo and radiation. EXAM: CT ABDOMEN AND PELVIS WITH CONTRAST TECHNIQUE: Multidetector CT imaging of the abdomen and pelvis was performed using the standard protocol following bolus administration of intravenous contrast. CONTRAST:  146mL ISOVUE-300 IOPAMIDOL (ISOVUE-300) INJECTION 61% COMPARISON:  04/01/2018 FINDINGS: Lower chest: Normal heart size without pericardial effusion or thickening. There is coronary arteriosclerosis along the included RCA and left circumflex arteries. Patchy right middle and lower lobe airspace disease with air bronchograms are redemonstrated without significant interval change. Findings may reflect chronic post treatment change. Hepatobiliary: No focal liver abnormality is seen. No gallstones, gallbladder wall thickening, or biliary dilatation. Pancreas: Unremarkable. No pancreatic ductal dilatation or surrounding inflammatory changes. Spleen: Normal in size without focal abnormality. Adrenals/Urinary Tract: Stable 2 cm right renal pelvic stone with an additional 6 mm calculus anteriorly within the interpolar right kidney, stable in appearance. Slight dilatation of the right intrarenal collecting system is stable. Stable right renal cyst in the upper pole of the right kidney measuring up to 8 mm. No solid enhancing masses. No hydroureteronephrosis. Equivocal nonobstructing calculus in the distal left ureter, series 2/75 measuring 1 mm. Stomach/Bowel: Small hiatal hernia. Physiologic distention of the stomach. Normal duodenal sweep and ligament of Treitz position. No bowel obstruction or inflammation. Normal-appearing appendix. Moderate amount of stool within large bowel with scattered colonic diverticulosis along the distal descending proximal sigmoid colon. No acute bowel obstruction or inflammation. Vascular/Lymphatic: Aortic atherosclerosis. No enlarged abdominal or pelvic lymph nodes. Reproductive: Mildly enlarged and heterogeneous prostate gland. Other: Small fat containing  bilateral inguinal hernias. Musculoskeletal: Thoracolumbar spondylosis. No aggressive osseous lesions. IMPRESSION: 1. Stable 2 cm right renal pelvic stone with mild chronic ectasia of the right renal collecting system. 2. Punctate 1 mm calcification is suggested in the distal left ureter just proximal to the left UVJ without hydroureteronephrosis. 3. Increased stool burden within the colon left-sided colonic diverticulosis. Diverticulitis. 4. Coronary arteriosclerosis. 5. Chronic stable patchy opacities at the right lung base with air bronchograms. Given history of treated right lung cancer, findings may represent post treatment change or pneumonia in the appropriate clinical setting. 6. Ancillary findings as above. Electronically Signed   By: Ashley Royalty M.D.   On: 04/17/2018 15:31   Ct Abdomen Pelvis W Contrast  Result Date: 04/01/2018 CLINICAL DATA:  Weakness today. EXAM: CT ABDOMEN AND PELVIS WITH CONTRAST TECHNIQUE: Multidetector CT imaging of the abdomen and pelvis was performed using the standard protocol following bolus administration of intravenous contrast. CONTRAST:  160mL ISOVUE-300 IOPAMIDOL (ISOVUE-300) INJECTION 61% COMPARISON:  02/21/2018. FINDINGS: Lower chest: Progressive patchy opacity in the right lower lobe with air  bronchograms. Minimal dependent atelectasis in the left lower lobe. No pleural fluid seen. Hepatobiliary: No focal liver abnormality is seen. No gallstones, gallbladder wall thickening, or biliary dilatation. Pancreas: Unremarkable. No pancreatic ductal dilatation or surrounding inflammatory changes. Spleen: Normal in size without focal abnormality. Adrenals/Urinary Tract: 2.1 cm calculus in the right renal pelvis with mild dilatation of the right renal collecting system. There is also an additional 7 mm calculus in the mid to lower right kidney anteriorly. Also demonstrated is a small right renal cyst. There are also several tiny lower pole left renal calculi. Normal appearing  adrenal glands, ureters and urinary bladder. Stomach/Bowel: Multiple sigmoid and descending colon diverticula. Prominent stool throughout the colon. No evidence of appendicitis. Mildly dilated, mildly dilated stomach containing fluid and ingested material. Unremarkable small bowel. Vascular/Lymphatic: Atheromatous arterial calcifications without aneurysm. No enlarged lymph nodes. Reproductive: Mildly enlarged and heterogeneous prostate gland. Other: Small bilateral inguinal hernias containing fat. Musculoskeletal: Lumbar and lower thoracic spine degenerative changes and mild scoliosis. IMPRESSION: 1. Progressive right lower lobe pneumonia. 2. 2.1 cm calculus in the right renal pelvis causing mild right hydronephrosis. 3. Small, nonobstructing bilateral renal calculi. 4. Prominent stool throughout the colon. 5. Sigmoid and descending colon diverticulosis. 6. Mild prostatic hypertrophy. Electronically Signed   By: Claudie Revering M.D.   On: 04/01/2018 19:29    Microbiology: Recent Results (from the past 240 hour(s))  Culture, blood (Routine x 2)     Status: None (Preliminary result)   Collection Time: 04/24/18  2:06 PM  Result Value Ref Range Status   Specimen Description BLOOD RIGHT ANTECUBITAL  Final   Special Requests   Final    BOTTLES DRAWN AEROBIC AND ANAEROBIC Blood Culture adequate volume   Culture   Final    NO GROWTH < 24 HOURS Performed at [facility], [address] [place], [place] [address]    Report Status PENDING  Incomplete  Culture, blood (Routine x 2)     Status: None (Preliminary result)   Collection Time: 04/24/18  2:08 PM  Result Value Ref Range Status   Specimen Description BLOOD LEFT ANTECUBITAL  Final   Special Requests   Final    BOTTLES DRAWN AEROBIC AND ANAEROBIC Blood Culture adequate volume   Culture   Final    NO GROWTH < 24 HOURS Performed at [facility], [address], [place], [place] [address]    Report Status PENDING  Incomplete     Labs: Basic Metabolic  Panel: Recent Labs  Lab 04/24/18 1406  NA 133*  K 3.5  CL 102  CO2 23  GLUCOSE 198*  BUN 10  CREATININE 1.41*  CALCIUM 8.7*   Liver Function Tests: Recent Labs  Lab 04/24/18 1406  AST 22  ALT 9  ALKPHOS 61  BILITOT 1.2  PROT 7.2  ALBUMIN 4.0   CBC: Recent Labs  Lab 04/24/18 1406  WBC 4.7  NEUTROABS 3.4  HGB 12.0*  HCT 37.1*  MCV 86.9  PLT 179   Cardiac Enzymes: Recent Labs  Lab 04/25/18 0125  TROPONINI 0.00   CBG: Recent Labs  Lab 04/24/18 2300 04/25/18 0758 04/25/18 1143  GLUCAP 193* 273* 372*   Signed:  Barton Dubois MD.  [facility] 04/25/2018, 4:33 PM

## 2018-04-25 NOTE — Progress Notes (Signed)
ANTICOAGULATION CONSULT NOTE - Initial Consult  Pharmacy Consult for Eliquis Indication: History of DVT and PE  Allergies  Allergen Reactions  . Aspirin Other (See Comments)    Nose bleeds     Patient Measurements: Height: 5\' 9"  (175.3 cm) Weight: 160 lb 0.9 oz (72.6 kg) IBW/kg (Calculated) : 70.7  Vital Signs: Temp: 98.7 F (37.1 C) (12/11 0528) Temp Source: Oral (12/11 0528) BP: 128/82 (12/11 0528) Pulse Rate: 82 (12/11 0528)  Labs: Recent Labs    04/24/18 1406 04/25/18 0125  HGB 12.0*  --   HCT 37.1*  --   PLT 179  --   LABPROT 15.2  --   INR 1.21  --   CREATININE 1.41*  --   TROPONINI  --  0.00    Estimated Creatinine Clearance: 46.7 mL/min (A) (by C-G formula based on SCr of 1.41 mg/dL (H)).   Medical History: Past Medical History:  Diagnosis Date  . Adenocarcinoma of right lung, stage 3 ([place]) 11/17/2016  . Anxiety   . Arthritis   . Coronary artery disease   . Diabetes mellitus   . Diverticulitis   . DVT (deep venous thrombosis) ([place])   . Encounter for antineoplastic chemotherapy 11/17/2016  . GERD (gastroesophageal reflux disease)   . History of radiation therapy 11/28/16-01/06/17   right lung was treated to 60 Gy in 30 fractions  . Hypercholesterolemia   . Hypertension   . PAD (peripheral artery disease) ([place])   . SOB (shortness of breath)     Medications:  Medications Prior to Admission  Medication Sig Dispense Refill Last Dose  . metFORMIN (GLUCOPHAGE-XR) 500 MG 24 hr tablet Take 2 tablets by mouth 2 (two) times daily.   04/24/2018 at Unknown time  . sucralfate (CARAFATE) 1 GM/10ML suspension Take 10 mLs (1 g total) by mouth 4 (four) times daily -  with meals and at bedtime. 420 mL 1 04/24/2018 at Unknown time  . albuterol (PROVENTIL HFA;VENTOLIN HFA) 108 (90 Base) MCG/ACT inhaler Inhale 1-2 puffs into the lungs every 6 (six) hours as needed for wheezing or shortness of breath.    03/31/2018  . apixaban (ELIQUIS) 5 MG TABS tablet Take 1 tablet (5  mg total) by mouth 2 (two) times daily. 60 tablet 0 03/31/2018  . cefdinir (OMNICEF) 300 MG capsule Take 1 capsule (300 mg total) by mouth 2 (two) times daily. (Patient not taking: Reported on 04/24/2018) 14 capsule 0 Not Taking at Unknown time  . dicyclomine (BENTYL) 20 MG tablet Take 1 tablet by mouth 3 (three) times daily.   03/31/2018  . feeding supplement, GLUCERNA SHAKE, (GLUCERNA SHAKE) LIQD Take 237 mLs by mouth 3 (three) times daily between meals. 90 Can 0 03/31/2018  . Fluticasone-Salmeterol (ADVAIR DISKUS) 250-50 MCG/DOSE AEPB Inhale 1 puff into the lungs every 12 (twelve) hours.    03/31/2018  . gabapentin (NEURONTIN) 300 MG capsule Take 1 capsule by mouth 3 (three) times daily.   03/31/2018  . glipiZIDE (GLUCOTROL XL) 5 MG 24 hr tablet Take 10 mg by mouth 2 (two) times daily.    03/31/2018  . guaiFENesin (MUCINEX) 600 MG 12 hr tablet Take 1 tablet (600 mg total) by mouth 2 (two) times daily. 10 tablet 0 03/31/2018  . guaiFENesin-dextromethorphan (ROBITUSSIN DM) 100-10 MG/5ML syrup Take 5 mLs by mouth every 4 (four) hours as needed for cough. 118 mL 0 03/31/2018  . HYDROcodone-acetaminophen (NORCO/VICODIN) 5-325 MG tablet 1 tab PO q12 hours prn pain 6 tablet 0   . levothyroxine (  SYNTHROID, LEVOTHROID) 100 MCG tablet TAKE 1 TABLET DAILY BEFORE BREAKFAST. 30 tablet 0 03/31/2018  . ondansetron (ZOFRAN) 4 MG tablet Take 1 tablet (4 mg total) by mouth every 6 (six) hours as needed. 12 tablet 0 03/31/2018  . pantoprazole (PROTONIX) 40 MG tablet Take 1 tablet (40 mg total) by mouth 2 (two) times daily. 30 tablet 0 03/31/2018  . rosuvastatin (CRESTOR) 20 MG tablet Take 1 tablet (20 mg total) by mouth daily. 30 tablet 11 03/31/2018  . sertraline (ZOLOFT) 25 MG tablet Take 50 mg by mouth daily.    03/31/2018  . ZETIA 10 MG tablet Take 10 mg by mouth daily.   03/31/2018    Assessment: 72 y.o. male with medical history significant of stage III adenocarcinoma of right lung, anxiety, CAD, type 2  diabetes, DVT and PE on anticoagulation with apixaban. Pharmacy asked to dose eliquis.  Goal of Therapy:  Monitor platelets by anticoagulation protocol: Yes   Plan:  Continue eliquis 5mg  po BID Monitor for S/S of bleeding  Isac Sarna, BS Vena [place], BCPS Clinical Pharmacist Pager [phone] 04/25/2018,10:23 AM

## 2018-04-25 NOTE — Progress Notes (Signed)
Patient discharged home with instructions on medications and follow up visits,patient verbalized understanding. Prescriptions sent to Pharmacy documented on AVS. Accompanied by staff to an awaiting vehicle.

## 2018-04-27 ENCOUNTER — Ambulatory Visit: Payer: Medicare HMO | Admitting: Urology

## 2018-04-29 LAB — CULTURE, BLOOD (ROUTINE X 2)
Culture: NO GROWTH
Culture: NO GROWTH
SPECIAL REQUESTS: ADEQUATE
Special Requests: ADEQUATE

## 2018-05-03 ENCOUNTER — Telehealth: Payer: Self-pay

## 2018-05-03 ENCOUNTER — Ambulatory Visit: Payer: Medicare HMO

## 2018-05-03 ENCOUNTER — Other Ambulatory Visit: Payer: Medicare HMO

## 2018-05-03 ENCOUNTER — Inpatient Hospital Stay: Payer: Medicare HMO | Attending: Internal Medicine | Admitting: Internal Medicine

## 2018-05-03 ENCOUNTER — Encounter: Payer: Self-pay | Admitting: Internal Medicine

## 2018-05-03 VITALS — BP 115/64 | HR 79 | Resp 18 | Ht 69.0 in | Wt 155.6 lb

## 2018-05-03 DIAGNOSIS — Z85118 Personal history of other malignant neoplasm of bronchus and lung: Secondary | ICD-10-CM

## 2018-05-03 DIAGNOSIS — I739 Peripheral vascular disease, unspecified: Secondary | ICD-10-CM

## 2018-05-03 DIAGNOSIS — C3490 Malignant neoplasm of unspecified part of unspecified bronchus or lung: Secondary | ICD-10-CM

## 2018-05-03 DIAGNOSIS — C3491 Malignant neoplasm of unspecified part of right bronchus or lung: Secondary | ICD-10-CM

## 2018-05-03 NOTE — Progress Notes (Signed)
[place] [phone]   [phone]  OFFICE PROGRESS NOTE  [name], MD [address] [place] [address]  DIAGNOSIS: Stage IIIA (T1a, N2, M0) non-small cell lung cancer, squamous cell carcinoma presented with right middle lobe pulmonary nodule in addition to ipsilateral hilar and ipsilateral mediastinal lymphadenopathy diagnosed in June 2018.  PRIOR THERAPY:   1) A course of concurrent chemoradiation with weekly carboplatin for AUC of 2 and paclitaxel 45 MG/M2. Status post 7 cycles. 2)  Consolidation immunotherapy with Imfinzi (Durvalumab) 10 MG/KG every 2 weeks.first dose 02/22/2017.  Status post 21 cycles.  Last dose was giving March 22, 2018 discontinued secondary to frequent hospitalization with pneumonia.  CURRENT THERAPY: Observation.  INTERVAL HISTORY: DOMIQUE CLAPPER 72 y.o. male returns to the clinic today for follow-up visit accompanied by his daughter.  The patient was recently admitted to [facility] with questionable pneumonia.  He had CT angiogram of the chest during his hospitalization that showed no concerning findings for disease progression.  He is feeling much better today.  He continues to have shortness of breath at baseline increased with exertion but no significant chest pain or hemoptysis.  He denied having any recent weight loss or night sweats.  He has no nausea, vomiting, diarrhea or constipation.  Unfortunately he continues to smoke at regular basis.   MEDICAL HISTORY: Past Medical History:  Diagnosis Date  . Adenocarcinoma of right lung, stage 3 ([place]) 11/17/2016  . Anxiety   . Arthritis   . Coronary artery disease   . Diabetes mellitus   . Diverticulitis   . DVT (deep venous thrombosis) ([place])   . Encounter for antineoplastic chemotherapy 11/17/2016  . GERD (gastroesophageal reflux disease)   . History of radiation therapy 11/28/16-01/06/17   right lung was treated to 60 Gy in 30 fractions  .  Hypercholesterolemia   . Hypertension   . PAD (peripheral artery disease) ([place])   . SOB (shortness of breath)     ALLERGIES:  is allergic to aspirin.  MEDICATIONS:  Current Outpatient Medications  Medication Sig Dispense Refill  . albuterol (PROVENTIL HFA;VENTOLIN HFA) 108 (90 Base) MCG/ACT inhaler Inhale 1-2 puffs into the lungs every 6 (six) hours as needed for wheezing or shortness of breath.     [name] apixaban (ELIQUIS) 5 MG TABS tablet Take 1 tablet (5 mg total) by mouth 2 (two) times daily. 60 tablet 0  . dicyclomine (BENTYL) 20 MG tablet Take 1 tablet by mouth 3 (three) times daily.    [name] doxycycline (VIBRA-TABS) 100 MG tablet Take 1 tablet (100 mg total) by mouth 2 (two) times daily for 10 days. 20 tablet 0  . Fluticasone-Salmeterol (ADVAIR DISKUS) 250-50 MCG/DOSE AEPB Inhale 1 puff into the lungs every 12 (twelve) hours.     . gabapentin (NEURONTIN) 400 MG capsule Take 400 mg by mouth 3 (three) times daily.     [name] glipiZIDE (GLUCOTROL XL) 5 MG 24 hr tablet Take 5 mg by mouth daily with breakfast.     . HYDROcodone-acetaminophen (NORCO/VICODIN) 5-325 MG tablet 1 tab PO q12 hours prn pain (Patient not taking: Reported on 04/25/2018) 6 tablet 0  . lisinopril (PRINIVIL,ZESTRIL) 5 MG tablet Take 5 mg by mouth daily.    . metFORMIN (GLUCOPHAGE-XR) 500 MG 24 hr tablet Take 2 tablets by mouth 2 (two) times daily.    [name] omeprazole (PRILOSEC) 40 MG capsule Take 40 mg by mouth daily.    [name]  ondansetron (ZOFRAN) 4 MG tablet Take 1 tablet (4 mg total) by mouth every 6 (six) hours as needed. 12 tablet 0  . predniSONE (DELTASONE) 20 MG tablet Take 2 tablets by mouth daily x2 days; then 1 tablet by mouth daily x3 days; then half tablet by mouth daily x3 days and stop prednisone. 10 tablet 0  . rOPINIRole (REQUIP) 3 MG tablet Take 3 mg by mouth 4 (four) times daily.    . rosuvastatin (CRESTOR) 20 MG tablet Take 1 tablet (20 mg total) by mouth daily. 30 tablet 11  . sertraline (ZOLOFT) 50 MG tablet Take 50  mg by mouth daily.     . sucralfate (CARAFATE) 1 GM/10ML suspension Take 10 mLs (1 g total) by mouth 4 (four) times daily -  with meals and at bedtime. 420 mL 1  . tiotropium (SPIRIVA HANDIHALER) 18 MCG inhalation capsule Place 1 capsule (18 mcg total) into inhaler and inhale daily. 30 capsule 2  . ZETIA 10 MG tablet Take 10 mg by mouth daily.     No current facility-administered medications for this visit.     SURGICAL HISTORY:  Past Surgical History:  Procedure Laterality Date  . CARDIAC CATHETERIZATION N/A 06/10/2015   Procedure: Left Heart Cath and Coronary Angiography;  Surgeon: Burnell Blanks, MD;  Location: [facility];  Service: Cardiovascular;  Laterality: N/A;  . LOWER EXTREMITY ANGIOGRAM N/A 07/13/2011   Procedure: LOWER EXTREMITY ANGIOGRAM;  Surgeon: Burnell Blanks, MD;  Location: [facility] CATH LAB;  Service: Cardiovascular;  Laterality: N/A;  . OTHER SURGICAL HISTORY    . OTHER SURGICAL HISTORY    . OTHER SURGICAL HISTORY    . PERCUTANEOUS STENT INTERVENTION Right 07/13/2011   Procedure: PERCUTANEOUS STENT INTERVENTION;  Surgeon: Burnell Blanks, MD;  Location: [facility] C[facility] LAB;  Service: Cardiovascular;  Laterality: Right;    REVIEW OF SYSTEMS:  A comprehensive review of systems was negative except for: Constitutional: positive for fatigue Respiratory: positive for cough and dyspnea on exertion   PHYSICAL EXAMINATION: General appearance: alert, cooperative, fatigued and no distress Head: Normocephalic, without obvious abnormality, atraumatic Neck: no adenopathy, no JVD, supple, symmetrical, trachea midline and thyroid not enlarged, symmetric, no tenderness/mass/nodules Lymph nodes: Cervical, supraclavicular, and axillary nodes normal. Resp: clear to auscultation bilaterally Back: symmetric, no curvature. ROM normal. No CVA tenderness. Cardio: regular rate and rhythm, S1, S2 normal, no murmur, click, rub or gallop GI: soft, non-tender; bowel sounds normal;  no masses,  no organomegaly Extremities: extremities normal, atraumatic, no cyanosis or edema  ECOG PERFORMANCE STATUS: 1 - Symptomatic but completely ambulatory  Blood pressure 115/64, pulse 79, resp. rate 18, height 5\' 9"  (1.753 m), weight 155 lb 9.6 oz (70.6 kg), SpO2 100 %.  LABORATORY DATA: Lab Results  Component Value Date   WBC 4.7 04/24/2018   HGB 12.0 (L) 04/24/2018   HCT 37.1 (L) 04/24/2018   MCV 86.9 04/24/2018   PLT 179 04/24/2018      Chemistry      Component Value Date/Time   NA 133 (L) 04/24/2018 1406   NA 136 05/04/2017 1036   K 3.5 04/24/2018 1406   K 4.0 05/04/2017 1036   CL 102 04/24/2018 1406   CO2 23 04/24/2018 1406   CO2 16 (L) 05/04/2017 1036   BUN 10 04/24/2018 1406   BUN 15.5 05/04/2017 1036   CREATININE 1.41 (H) 04/24/2018 1406   CREATININE 1.32 (H) 04/04/2018 1109   CREATININE 1.1 05/04/2017 1036      Component  Value Date/Time   CALCIUM 8.7 (L) 04/24/2018 1406   CALCIUM 9.6 05/04/2017 1036   ALKPHOS 61 04/24/2018 1406   ALKPHOS 93 05/04/2017 1036   AST 22 04/24/2018 1406   AST 17 04/04/2018 1109   AST 13 05/04/2017 1036   ALT 9 04/24/2018 1406   ALT <6 04/04/2018 1109   ALT 7 05/04/2017 1036   BILITOT 1.2 04/24/2018 1406   BILITOT 0.4 04/04/2018 1109   BILITOT 0.56 05/04/2017 1036       RADIOGRAPHIC STUDIES: Dg Chest 2 View  Result Date: 04/24/2018 CLINICAL DATA:  History of adenocarcinoma right lung. Radiation therapy. EXAM: CHEST - 2 VIEW COMPARISON:  04/17/2018. FINDINGS: Mediastinum and hilar structures are normal. Heart size normal. COPD. Findings consistent atelectasis and scarring right mid lung field. This most likely secondary to radiation therapy. Similar findings noted on prior exam. No acute abnormality identified. No pleural effusion or pneumothorax IMPRESSION: Right mid lung field/scarring consistent with prior radiation therapy. Similar findings noted on prior exam. No acute abnormality. Electronically Signed   By:  Marcello Moores  Register   On: 04/24/2018 14:35   Dg Chest 2 View  Result Date: 04/17/2018 CLINICAL DATA:  Chest pain with shortness of breath. EXAM: CHEST - 2 VIEW COMPARISON:  Chest x-rays dated 04/01/2018, 02/21/2018 and 11/19/2017. Chest CT dated 02/06/2018. FINDINGS: Heart size and mediastinal contours are stable. Again noted are post radiation changes within the RIGHT perihilar lung to the RIGHT lung base. No new lung findings. No pleural effusion or pneumothorax seen. No acute or suspicious osseous finding. IMPRESSION: No active cardiopulmonary disease. No evidence of pneumonia or pulmonary edema. Electronically Signed   By: Franki Cabot M.D.   On: 04/17/2018 14:30   Dg Abd 1 View  Result Date: 04/24/2018 CLINICAL DATA:  72 y/o  M; nausea and abdominal pain. EXAM: ABDOMEN - 1 VIEW COMPARISON:  04/17/2018 CT abdomen and pelvis. FINDINGS: Normal bowel gas pattern. 21 mm stone projects over the right renal pelvis as seen on prior CT of abdomen and pelvis. Contrast accumulation within the urinary bladder. Moderate spondylosis of the lumbar spine with dextrocurvature. No acute osseous abnormality is identified. IMPRESSION: Normal bowel gas pattern. Large right kidney stone projecting over renal pelvis, stable from CT given differences in technique. Electronically Signed   By: Kristine Garbe M.D.   On: 04/24/2018 23:27   Ct Angio Chest Pe W And/or Wo Contrast  Result Date: 04/24/2018 CLINICAL DATA:  Fever and generalized weakness since last night. Shortness of breath. Status post chemotherapy and radiation therapy for right lung non-small cell lung cancer. EXAM: CT ANGIOGRAPHY CHEST WITH CONTRAST TECHNIQUE: Multidetector CT imaging of the chest was performed using the standard protocol during bolus administration of intravenous contrast. Multiplanar CT image reconstructions and MIPs were obtained to evaluate the vascular anatomy. CONTRAST:  31mL ISOVUE-370 IOPAMIDOL (ISOVUE-370) INJECTION 76%  COMPARISON:  Chest radiographs obtained earlier today. Chest CT dated 02/06/2018. FINDINGS: Cardiovascular: Normally opacified pulmonary arteries with no pulmonary arterial filling defects seen. Atheromatous calcifications, including the coronary arteries and aorta. Mediastinum/Nodes: No enlarged mediastinal, hilar, or axillary lymph nodes. No enlarged lymph nodes. Mild diffuse esophageal wall thickening without significant change. Unremarkable included portions of the thyroid gland. Lungs/Pleura: Small right pleural effusion. Stable bilateral bullous changes and diffuse peribronchial thickening. Mildly improved confluent and linear density in the medial aspect of the right lung with air bronchograms and cylindrical bronchiectasis. Upper Abdomen: Unremarkable. Musculoskeletal: Thoracic and lower cervical spine degenerative changes. Mild sternomanubrial joint degenerative changes and  left sternoclavicular joint degenerative changes. Review of the MIP images confirms the above findings. IMPRESSION: 1. No pulmonary emboli. 2. Mildly improved confluent and linear density in the medial aspect of the right lung with air bronchograms and cylindrical bronchiectasis compatible with postradiation changes. 3. Interval small right pleural effusion. 4. Stable changes of COPD and chronic bronchitis. 5. Stable mild diffuse esophageal wall thickening, most likely due to radiation esophagitis or reflux esophagitis. 6.  Calcific coronary artery and aortic atherosclerosis. Aortic Atherosclerosis (ICD10-I70.0) and Emphysema (ICD10-J43.9). Electronically Signed   By: Claudie Revering M.D.   On: 04/24/2018 19:44   Ct Abdomen Pelvis W Contrast  Result Date: 04/17/2018 CLINICAL DATA:  Generalized abdominal pain since last evening. History of right lung cancer with chemo and radiation. EXAM: CT ABDOMEN AND PELVIS WITH CONTRAST TECHNIQUE: Multidetector CT imaging of the abdomen and pelvis was performed using the standard protocol following  bolus administration of intravenous contrast. CONTRAST:  161mL ISOVUE-300 IOPAMIDOL (ISOVUE-300) INJECTION 61% COMPARISON:  04/01/2018 FINDINGS: Lower chest: Normal heart size without pericardial effusion or thickening. There is coronary arteriosclerosis along the included RCA and left circumflex arteries. Patchy right middle and lower lobe airspace disease with air bronchograms are redemonstrated without significant interval change. Findings may reflect chronic post treatment change. Hepatobiliary: No focal liver abnormality is seen. No gallstones, gallbladder wall thickening, or biliary dilatation. Pancreas: Unremarkable. No pancreatic ductal dilatation or surrounding inflammatory changes. Spleen: Normal in size without focal abnormality. Adrenals/Urinary Tract: Stable 2 cm right renal pelvic stone with an additional 6 mm calculus anteriorly within the interpolar right kidney, stable in appearance. Slight dilatation of the right intrarenal collecting system is stable. Stable right renal cyst in the upper pole of the right kidney measuring up to 8 mm. No solid enhancing masses. No hydroureteronephrosis. Equivocal nonobstructing calculus in the distal left ureter, series 2/75 measuring 1 mm. Stomach/Bowel: Small hiatal hernia. Physiologic distention of the stomach. Normal duodenal sweep and ligament of Treitz position. No bowel obstruction or inflammation. Normal-appearing appendix. Moderate amount of stool within large bowel with scattered colonic diverticulosis along the distal descending proximal sigmoid colon. No acute bowel obstruction or inflammation. Vascular/Lymphatic: Aortic atherosclerosis. No enlarged abdominal or pelvic lymph nodes. Reproductive: Mildly enlarged and heterogeneous prostate gland. Other: Small fat containing bilateral inguinal hernias. Musculoskeletal: Thoracolumbar spondylosis. No aggressive osseous lesions. IMPRESSION: 1. Stable 2 cm right renal pelvic stone with mild chronic ectasia of  the right renal collecting system. 2. Punctate 1 mm calcification is suggested in the distal left ureter just proximal to the left UVJ without hydroureteronephrosis. 3. Increased stool burden within the colon left-sided colonic diverticulosis. Diverticulitis. 4. Coronary arteriosclerosis. 5. Chronic stable patchy opacities at the right lung base with air bronchograms. Given history of treated right lung cancer, findings may represent post treatment change or pneumonia in the appropriate clinical setting. 6. Ancillary findings as above. Electronically Signed   By: Ashley Royalty M.D.   On: 04/17/2018 15:31    ASSESSMENT AND PLAN: This is a very pleasant 72 years old white male with a stage IIIA non-small cell lung cancer, squamous cell carcinoma  The patient underwent a course of concurrent chemoradiation with weekly carboplatin and paclitaxel status post 7 cycles. He had partial response after this treatment.  The patient is currently undergoing consolidation immunotherapy with Imfinzi (Durvalumab) status post 21 cycles. The patient has been tolerating this treatment well but he has frequent hospitalization for recurrent pneumonia.  I had a lengthy discussion with the patient and his daughter  about his condition and further treatment options.  I think the patient received a reasonable number of cycles of immunotherapy with Imfinzi (Durvalumab).  It has been very challenging for the patient to come to the clinic and he has been always dependent on his daughter to bring him to treatment. I recommended for the patient to discontinue his current treatment with Imfinzi (Durvalumab) at this point. He is currently on observation and he is feeling fine. Repeat CT scan of the chest performed recently showed no concerning findings for disease progression. I recommended for the patient to continue on observation with repeat CT scan of the chest in 3 months. He was advised to call immediately if he has any concerning  symptoms in the interval. The patient voices understanding of current disease status and treatment options and is in agreement with the current care plan. All questions were answered. The patient knows to call the clinic with any problems, questions or concerns. We can certainly see the patient much sooner if necessary.  Disclaimer: This note was dictated with voice recognition software. Similar sounding words can inadvertently be transcribed and may not be corrected upon review.

## 2018-05-03 NOTE — Telephone Encounter (Signed)
Printed avs and calender of upcoming appointment. Per 12/19 los 

## 2018-05-17 ENCOUNTER — Other Ambulatory Visit: Payer: Medicare HMO

## 2018-05-17 ENCOUNTER — Ambulatory Visit: Payer: Medicare HMO

## 2018-05-17 ENCOUNTER — Ambulatory Visit: Payer: Medicare HMO | Admitting: Oncology

## 2018-05-28 ENCOUNTER — Encounter (HOSPITAL_COMMUNITY): Payer: Self-pay | Admitting: Emergency Medicine

## 2018-05-28 ENCOUNTER — Emergency Department (HOSPITAL_COMMUNITY): Payer: Medicare HMO

## 2018-05-28 ENCOUNTER — Other Ambulatory Visit: Payer: Self-pay

## 2018-05-28 ENCOUNTER — Inpatient Hospital Stay (HOSPITAL_COMMUNITY)
Admission: EM | Admit: 2018-05-28 | Discharge: 2018-06-06 | DRG: 693 | Disposition: A | Discharge status: Skilled Nursing Facility | Payer: Medicare HMO | Attending: Family Medicine | Admitting: Family Medicine

## 2018-05-28 DIAGNOSIS — Z86711 Personal history of pulmonary embolism: Secondary | ICD-10-CM

## 2018-05-28 DIAGNOSIS — C3491 Malignant neoplasm of unspecified part of right bronchus or lung: Secondary | ICD-10-CM | POA: Diagnosis present

## 2018-05-28 DIAGNOSIS — Z923 Personal history of irradiation: Secondary | ICD-10-CM

## 2018-05-28 DIAGNOSIS — N12 Tubulo-interstitial nephritis, not specified as acute or chronic: Secondary | ICD-10-CM | POA: Diagnosis present

## 2018-05-28 DIAGNOSIS — Z8673 Personal history of transient ischemic attack (TIA), and cerebral infarction without residual deficits: Secondary | ICD-10-CM

## 2018-05-28 DIAGNOSIS — K219 Gastro-esophageal reflux disease without esophagitis: Secondary | ICD-10-CM | POA: Diagnosis present

## 2018-05-28 DIAGNOSIS — N202 Calculus of kidney with calculus of ureter: Principal | ICD-10-CM | POA: Diagnosis present

## 2018-05-28 DIAGNOSIS — E7800 Pure hypercholesterolemia, unspecified: Secondary | ICD-10-CM | POA: Diagnosis present

## 2018-05-28 DIAGNOSIS — G9341 Metabolic encephalopathy: Secondary | ICD-10-CM | POA: Diagnosis present

## 2018-05-28 DIAGNOSIS — R531 Weakness: Secondary | ICD-10-CM

## 2018-05-28 DIAGNOSIS — Z794 Long term (current) use of insulin: Secondary | ICD-10-CM

## 2018-05-28 DIAGNOSIS — Z79899 Other long term (current) drug therapy: Secondary | ICD-10-CM

## 2018-05-28 DIAGNOSIS — R109 Unspecified abdominal pain: Secondary | ICD-10-CM

## 2018-05-28 DIAGNOSIS — F17200 Nicotine dependence, unspecified, uncomplicated: Secondary | ICD-10-CM | POA: Diagnosis present

## 2018-05-28 DIAGNOSIS — I10 Essential (primary) hypertension: Secondary | ICD-10-CM | POA: Diagnosis present

## 2018-05-28 DIAGNOSIS — R630 Anorexia: Secondary | ICD-10-CM | POA: Diagnosis present

## 2018-05-28 DIAGNOSIS — I2510 Atherosclerotic heart disease of native coronary artery without angina pectoris: Secondary | ICD-10-CM | POA: Diagnosis present

## 2018-05-28 DIAGNOSIS — R1114 Bilious vomiting: Secondary | ICD-10-CM

## 2018-05-28 DIAGNOSIS — E1122 Type 2 diabetes mellitus with diabetic chronic kidney disease: Secondary | ICD-10-CM | POA: Diagnosis present

## 2018-05-28 DIAGNOSIS — R4182 Altered mental status, unspecified: Secondary | ICD-10-CM | POA: Diagnosis present

## 2018-05-28 DIAGNOSIS — E1151 Type 2 diabetes mellitus with diabetic peripheral angiopathy without gangrene: Secondary | ICD-10-CM | POA: Diagnosis present

## 2018-05-28 DIAGNOSIS — R680 Hypothermia, not associated with low environmental temperature: Secondary | ICD-10-CM | POA: Diagnosis present

## 2018-05-28 DIAGNOSIS — G620 Drug-induced polyneuropathy: Secondary | ICD-10-CM | POA: Diagnosis present

## 2018-05-28 DIAGNOSIS — Z86718 Personal history of other venous thrombosis and embolism: Secondary | ICD-10-CM

## 2018-05-28 DIAGNOSIS — R5381 Other malaise: Secondary | ICD-10-CM | POA: Diagnosis present

## 2018-05-28 DIAGNOSIS — E785 Hyperlipidemia, unspecified: Secondary | ICD-10-CM | POA: Diagnosis present

## 2018-05-28 DIAGNOSIS — T451X5A Adverse effect of antineoplastic and immunosuppressive drugs, initial encounter: Secondary | ICD-10-CM | POA: Diagnosis present

## 2018-05-28 DIAGNOSIS — Z7901 Long term (current) use of anticoagulants: Secondary | ICD-10-CM

## 2018-05-28 DIAGNOSIS — N390 Urinary tract infection, site not specified: Secondary | ICD-10-CM | POA: Diagnosis present

## 2018-05-28 DIAGNOSIS — R443 Hallucinations, unspecified: Secondary | ICD-10-CM | POA: Diagnosis present

## 2018-05-28 DIAGNOSIS — E039 Hypothyroidism, unspecified: Secondary | ICD-10-CM | POA: Diagnosis present

## 2018-05-28 LAB — URINALYSIS, ROUTINE W REFLEX MICROSCOPIC
Bacteria, UA: NONE SEEN
Bilirubin Urine: NEGATIVE
Glucose, UA: NEGATIVE mg/dL
KETONES UR: NEGATIVE mg/dL
Leukocytes, UA: NEGATIVE
Nitrite: NEGATIVE
PROTEIN: NEGATIVE mg/dL
Specific Gravity, Urine: 1.017 (ref 1.005–1.030)
pH: 7 (ref 5.0–8.0)

## 2018-05-28 LAB — CBC WITH DIFFERENTIAL/PLATELET
Abs Immature Granulocytes: 0.01 10*3/uL (ref 0.00–0.07)
Basophils Absolute: 0.1 10*3/uL (ref 0.0–0.1)
Basophils Relative: 3 %
Eosinophils Absolute: 0.3 10*3/uL (ref 0.0–0.5)
Eosinophils Relative: 8 %
HCT: 38 % — ABNORMAL LOW (ref 39.0–52.0)
HEMOGLOBIN: 11.9 g/dL — AB (ref 13.0–17.0)
Immature Granulocytes: 0 %
LYMPHS PCT: 27 %
Lymphs Abs: 0.9 10*3/uL (ref 0.7–4.0)
MCH: 27.7 pg (ref 26.0–34.0)
MCHC: 31.3 g/dL (ref 30.0–36.0)
MCV: 88.4 fL (ref 80.0–100.0)
Monocytes Absolute: 0.3 10*3/uL (ref 0.1–1.0)
Monocytes Relative: 8 %
Neutro Abs: 1.7 10*3/uL (ref 1.7–7.7)
Neutrophils Relative %: 54 %
Platelets: 181 10*3/uL (ref 150–400)
RBC: 4.3 MIL/uL (ref 4.22–5.81)
RDW: 15.1 % (ref 11.5–15.5)
WBC: 3.2 10*3/uL — ABNORMAL LOW (ref 4.0–10.5)
nRBC: 0 % (ref 0.0–0.2)

## 2018-05-28 LAB — COMPREHENSIVE METABOLIC PANEL
ALT: 11 U/L (ref 0–44)
AST: 31 U/L (ref 15–41)
Albumin: 4.1 g/dL (ref 3.5–5.0)
Alkaline Phosphatase: 48 U/L (ref 38–126)
Anion gap: 8 (ref 5–15)
BUN: 11 mg/dL (ref 8–23)
CO2: 23 mmol/L (ref 22–32)
CREATININE: 1.54 mg/dL — AB (ref 0.61–1.24)
Calcium: 8.4 mg/dL — ABNORMAL LOW (ref 8.9–10.3)
Chloride: 103 mmol/L (ref 98–111)
GFR calc Af Amer: 51 mL/min — ABNORMAL LOW (ref >60)
GFR calc non Af Amer: 44 mL/min — ABNORMAL LOW (ref >60)
Glucose, Bld: 147 mg/dL — ABNORMAL HIGH (ref 70–99)
Potassium: 3.5 mmol/L (ref 3.5–5.1)
Sodium: 134 mmol/L — ABNORMAL LOW (ref 135–145)
Total Bilirubin: 0.8 mg/dL (ref 0.3–1.2)
Total Protein: 7.3 g/dL (ref 6.5–8.1)

## 2018-05-28 LAB — INFLUENZA PANEL BY PCR (TYPE A & B)
Influenza A By PCR: NEGATIVE
Influenza B By PCR: NEGATIVE

## 2018-05-28 LAB — CBG MONITORING, ED: Glucose-Capillary: 111 mg/dL — ABNORMAL HIGH (ref 70–99)

## 2018-05-28 LAB — I-STAT CG4 LACTIC ACID, ED: Lactic Acid, Venous: 0.51 mmol/L (ref 0.5–1.9)

## 2018-05-28 LAB — CK: Total CK: 345 U/L (ref 49–397)

## 2018-05-28 MED ORDER — IOPAMIDOL (ISOVUE-300) INJECTION 61%
100.0000 mL | Freq: Once | INTRAVENOUS | Status: AC | PRN
  Administered 2018-05-28: 100 mL via INTRAVENOUS

## 2018-05-28 MED ORDER — SODIUM CHLORIDE 0.9 % IV BOLUS
1000.0000 mL | Freq: Once | INTRAVENOUS | Status: AC
  Administered 2018-05-28: 1000 mL via INTRAVENOUS

## 2018-05-28 MED ORDER — SODIUM CHLORIDE 0.9 % IV SOLN
1.0000 g | Freq: Once | INTRAVENOUS | Status: AC
  Administered 2018-05-29: 1 g via INTRAVENOUS
  Filled 2018-05-28: qty 10

## 2018-05-28 NOTE — ED Triage Notes (Signed)
Pt c/o generalized body aches, back and leg pain. Has had flu and pneumonia vaccine. Pt lives by himself. Denies N/V/D/. Per daughter pt is acting like his normal self.

## 2018-05-28 NOTE — ED Provider Notes (Signed)
Emergency Department Provider Note   I have reviewed the triage vital signs and the nursing notes.   HISTORY  Chief Complaint Generalized Body Aches   HPI Matthew Wilkins is a 73 y.o. male with PMH of GERD, DVT, lung cancer recently off chemotherapy, HLD, HTN, and PAD presents to the emergency department for evaluation of abdominal pain with cramping in the legs and back.  The patient lives at home alone with assistance from his daughter who checks in on him.  He states he is had the above symptoms for several days along with generalized weakness.  He denies any chest pain, shortness of breath, productive cough.  He has not experienced any runny nose. Denies any CP or SOB.  Patient feels the urge to have diarrhea but denies reducing any diarrhea.  He is not experiencing nausea or vomiting.  Unclear regarding fevers at home.  No recent falls or prolonged downtime on the floor.   Past Medical History:  Diagnosis Date  . Adenocarcinoma of right lung, stage 3 ([place]) 11/17/2016  . Anxiety   . Arthritis   . Coronary artery disease   . Diabetes mellitus   . Diverticulitis   . DVT (deep venous thrombosis) ([place])   . Encounter for antineoplastic chemotherapy 11/17/2016  . GERD (gastroesophageal reflux disease)   . History of radiation therapy 11/28/16-01/06/17   right lung was treated to 60 Gy in 30 fractions  . Hypercholesterolemia   . Hypertension   . PAD (peripheral artery disease) ([place])   . SOB (shortness of breath)     Patient Active Problem List   Diagnosis Date Noted  . Anorexia 04/25/2018  . Generalized weakness 04/25/2018  . Chronic anemia 04/25/2018  . CKD (chronic kidney disease) stage 3, GFR 30-59 ml/min (HCC) 04/25/2018  . Microscopic hematuria 04/25/2018  . HLD (hyperlipidemia) 04/25/2018  . COPD exacerbation ([place]) 04/24/2018  . Nephrolithiasis 04/03/2018  . RLL pneumonia ([place]) 04/03/2018  . CAP (community acquired pneumonia) 04/01/2018  . Reflux esophagitis 02/24/2018   . Esophageal ulcer without bleeding 02/24/2018  . Acute renal injury ([place]) 02/23/2018  . Diverticulitis of intestine 02/23/2018  . GERD (gastroesophageal reflux disease) 02/23/2018  . AKI (acute kidney injury) ([place]) 11/20/2017  . UTI (urinary tract infection) 11/20/2017  . Aphasia   . RUQ abdominal pain   . Hypothyroidism 07/27/2017  . HCAP (healthcare-associated pneumonia) 07/17/2017  . Diabetes mellitus due to underlying condition, uncontrolled ([place]) 07/17/2017  . Acute respiratory failure ([place]) 07/17/2017  . Encounter for antineoplastic immunotherapy 02/23/2017  . Adenocarcinoma of right lung, stage 3 ([place]) 11/17/2016  . Encounter for antineoplastic chemotherapy 11/17/2016  . Goals of care, counseling/discussion 11/17/2016  . Encounter for smoking cessation counseling 11/17/2016  . Left leg DVT ([place]) 08/31/2016  . Malnutrition of moderate degree 08/30/2016  . Thrombocytopenia ([place]) 08/29/2016  . History of pulmonary embolus (PE) 08/28/2016  . Pulmonary nodule 08/28/2016  . Hydronephrosis of right kidney 08/28/2016  . Chest pain 08/28/2016  . Abdominal pain 08/28/2016  . CAD (coronary artery disease)   . TIA (transient ischemic attack) 11/27/2011  . Research study patient 08/30/2011  . PAD (peripheral artery disease) ([place]) 06/30/2011  . PVD (peripheral vascular disease) ([place]) 01/10/2011  . COPD (chronic obstructive pulmonary disease) ([place]) 07/06/2010  . Tobacco use 11/12/2008  . Coronary atherosclerosis 09/08/2008  . DM type 2 (diabetes mellitus, type 2) ([place]) 08/26/2008  . HYPERCHOLESTEROLEMIA  IIA 08/26/2008  . ANXIETY 08/26/2008  . PARKINSON'S DISEASE 08/26/2008  . HTN (hypertension) 08/26/2008  .  ARTHRITIS 08/26/2008  . SHORTNESS OF BREATH 08/26/2008    Past Surgical History:  Procedure Laterality Date  . CARDIAC CATHETERIZATION N/A 06/10/2015   Procedure: Left Heart Cath and Coronary Angiography;  Surgeon: Burnell Blanks, MD;  Location: [facility];   Service: Cardiovascular;  Laterality: N/A;  . LOWER EXTREMITY ANGIOGRAM N/A 07/13/2011   Procedure: LOWER EXTREMITY ANGIOGRAM;  Surgeon: Burnell Blanks, MD;  Location: [facility] CATH LAB;  Service: Cardiovascular;  Laterality: N/A;  . OTHER SURGICAL HISTORY    . OTHER SURGICAL HISTORY    . OTHER SURGICAL HISTORY    . PERCUTANEOUS STENT INTERVENTION Right 07/13/2011   Procedure: PERCUTANEOUS STENT INTERVENTION;  Surgeon: Burnell Blanks, MD;  Location: [facility] CATH LAB;  Service: Cardiovascular;  Laterality: Right;    Allergies Aspirin  Family History  Family history unknown: Yes    Social History Social History   Tobacco Use  . Smoking status: Current Every Day Smoker    Packs/day: 1.00    Years: 65.00    Pack years: 65.00    Types: Cigarettes  . Smokeless tobacco: Never Used  Substance Use Topics  . Alcohol use: No  . Drug use: No    Review of Systems  Constitutional: No fever/chills. Positive generalized body aches and weakness.  Eyes: No visual changes. ENT: No sore throat. Cardiovascular: Denies chest pain. Respiratory: Denies shortness of breath. Gastrointestinal: Positive lower abdominal pain.  No nausea, no vomiting.  No diarrhea.  No constipation. Genitourinary: Negative for dysuria. Musculoskeletal: Positive back and bilateral leg pain.  Skin: Negative for rash. Neurological: Negative for headaches, focal weakness or numbness.  10-point ROS otherwise negative.  ____________________________________________   PHYSICAL EXAM:  VITAL SIGNS: ED Triage Vitals  Enc Vitals Group     BP 05/28/18 1747 (!) 126/91     Pulse Rate 05/28/18 1747 89     Resp 05/28/18 1747 20     Temp 05/28/18 1747 (!) 97.5 F (36.4 C)     Temp Source 05/28/18 1747 Oral     SpO2 05/28/18 1747 100 %     Weight 05/28/18 1748 150 lb (68 kg)     Height 05/28/18 1748 5\' 9"  (1.753 m)     Pain Score 05/28/18 1748 10   Constitutional: Alert and oriented. Chronically ill-appearing.  No acute distress.  Eyes: Conjunctivae are normal.  Head: Atraumatic. Nose: No congestion/rhinnorhea. Mouth/Throat: Mucous membranes are dry.  Neck: No stridor.   Cardiovascular: Normal rate, regular rhythm. Good peripheral circulation. Grossly normal heart sounds.   Respiratory: Normal respiratory effort.  No retractions. Lungs CTAB. Gastrointestinal: Soft with lower abdominal tenderness worse in the RLQ. No distention.  Musculoskeletal: No lower extremity tenderness nor edema. No gross deformities of extremities. Neurologic:  Normal speech and language. No gross focal neurologic deficits are appreciated.  Skin:  Skin is warm, dry and intact. No rash noted.  ____________________________________________   LABS (all labs ordered are listed, but only abnormal results are displayed)  Labs Reviewed  CBC WITH DIFFERENTIAL/PLATELET - Abnormal; Notable for the following components:      Result Value   WBC 3.2 (*)    Hemoglobin 11.9 (*)    HCT 38.0 (*)    All other components within normal limits  COMPREHENSIVE METABOLIC PANEL - Abnormal; Notable for the following components:   Sodium 134 (*)    Glucose, Bld 147 (*)    Creatinine, Ser 1.54 (*)    Calcium 8.4 (*)    GFR calc non  Af Amer 44 (*)    GFR calc Af Amer 51 (*)    All other components within normal limits  URINALYSIS, ROUTINE W REFLEX MICROSCOPIC - Abnormal; Notable for the following components:   Hgb urine dipstick LARGE (*)    All other components within normal limits  CBG MONITORING, ED - Abnormal; Notable for the following components:   Glucose-Capillary 111 (*)    All other components within normal limits  CULTURE, BLOOD (ROUTINE X 2)  CULTURE, BLOOD (ROUTINE X 2)  URINE CULTURE  CK  INFLUENZA PANEL BY PCR (TYPE A & B)  I-STAT CG4 LACTIC ACID, ED  I-STAT CG4 LACTIC ACID, ED   ____________________________________________  RADIOLOGY  Dg Chest 2 View  Result Date: 05/28/2018 CLINICAL DATA:  Generalized body  aches.  Flu like symptoms. EXAM: CHEST - 2 VIEW COMPARISON:  April 24, 2018 FINDINGS: Radiation changes to the right hilum consistent with history of treated lung cancer. The cardiomediastinal silhouette is stable. No pneumothorax. No new nodules or masses. No focal infiltrates. IMPRESSION: Postradiation changes to the right lung.  No acute infiltrate. Electronically Signed   By: Dorise Bullion III M.D   On: 05/28/2018 18:54   Ct Abdomen Pelvis W Contrast  Result Date: 05/28/2018 CLINICAL DATA:  Generalized body aches, back and leg pain. EXAM: CT ABDOMEN AND PELVIS WITH CONTRAST TECHNIQUE: Multidetector CT imaging of the abdomen and pelvis was performed using the standard protocol following bolus administration of intravenous contrast. CONTRAST:  16mL ISOVUE-300 IOPAMIDOL (ISOVUE-300) INJECTION 61% COMPARISON:  Body CT 04/17/2018 FINDINGS: Lower chest: Normal heart size. Heavy calcific atherosclerotic disease of the coronary arteries. Persistent patchy peribronchial airspace thickening in the right lower lobe, partially visualized. Small right pleural effusion. Small hiatal hernia. Hepatobiliary: No focal liver abnormality is seen. No gallstones, gallbladder wall thickening, or biliary dilatation. Pancreas: Unremarkable. No pancreatic ductal dilatation or surrounding inflammatory changes. Spleen: Normal in size without focal abnormality. Adrenals/Urinary Tract: Normal appearance of the adrenal glands and left kidney. Re demonstrated is 2.3 cm calculus in the posterior right pelvis. There stable prominence of the right collecting system with hyperenhancement of the urothelium at the level of the renal pelvis and proximal ureter. There is a second nonobstructive calculus in the superior pole of the right kidney measuring 7 mm. There is a 2 mm bladder calculus in the region of the right trigone. 13 mm mid polar right renal cyst. Stomach/Bowel: Stomach is within normal limits. Appendix appears normal. No  evidence of bowel wall thickening, distention, or inflammatory changes. Scattered colonic diverticulosis. Vascular/Lymphatic: Aortic atherosclerosis. No enlarged abdominal or pelvic lymph nodes. Reproductive: Nonspecific enlargement of the prostate gland. Other: No abdominal wall hernia or abnormality. No abdominopelvic ascites. Musculoskeletal: Spondylosis of the lumbosacral spine. IMPRESSION: 2.3 cm calculus within the right renal pelvis. Hyperenhancement of the urothelium of the right renal pelvis and proximal right ureter, query infectious in etiology. Please correlate to urinalysis. 2 mm nonobstructive calculus within the right trigone region of the urinary bladder. Colonic diverticulosis without evidence of diverticulitis. Partially visualized peribronchial airspace thickening in the right lung base thought to represent posttreatment/postradiation changes. Persistent small right pleural effusion. Calcific atherosclerotic disease of the aorta and coronary arteries. Electronically Signed   By: Fidela Salisbury M.D.   On: 05/28/2018 22:17    ____________________________________________   PROCEDURES  Procedure(s) performed:   Procedures  None ____________________________________________   INITIAL IMPRESSION / ASSESSMENT AND PLAN / ED COURSE  Pertinent labs & imaging results that were available during my  care of the patient were reviewed by me and considered in my medical decision making (see chart for details).  Patient presents to the emergency department with body aches, generalized weakness, abdominal pain.  He has focal tenderness to palpation of the lower and right lower quadrant.  No rebound or guarding.  Patient's vitals with no significant abnormality.  Doubt sepsis but do plan to obtain a lactic acid and blood cultures along with a CK.  Chest x-ray reviewed from triage which shows no acute infiltrate.   11:00 PM CT scan reviewed.  There is increased enhancement in the right  renal pelvis and proximal ureter with 2.3 cm stone.  UA without sign of infection.  Patient with mild hypothermia on arrival and subjective infection symptoms at home.  Lactate is normal.  Discussed the case with urology, Dr. Lovena Neighbours.  Advises empiric treatment and the patient can follow-up as an outpatient with urology.  No emergent stone intervention unless the patient were to become septic.  I have given Rocephin and will send urine for culture.   Discussed patient's case with Hospitalist, Dr. Darrick Meigs to request admission. Patient and family (if present) updated with plan. Care transferred to Hospitalist service.  I reviewed all nursing notes, vitals, pertinent old records, EKGs, labs, imaging (as available).  ____________________________________________  FINAL CLINICAL IMPRESSION(S) / ED DIAGNOSES  Final diagnoses:  Right flank pain  Generalized weakness    MEDICATIONS GIVEN DURING THIS VISIT:  Medications  cefTRIAXone (ROCEPHIN) 1 g in sodium chloride 0.9 % 100 mL IVPB (has no administration in time range)  sodium chloride 0.9 % bolus 1,000 mL (0 mLs Intravenous Stopped 05/28/18 2132)  iopamidol (ISOVUE-300) 61 % injection 100 mL (100 mLs Intravenous Contrast Given 05/28/18 2140)    Note:  This document was prepared using Dragon voice recognition software and may include unintentional dictation errors.  Nanda Quinton, MD Emergency Medicine    Icyss Skog, [facility], MD 05/29/18 [phone]

## 2018-05-29 DIAGNOSIS — N390 Urinary tract infection, site not specified: Secondary | ICD-10-CM | POA: Diagnosis not present

## 2018-05-29 DIAGNOSIS — R531 Weakness: Secondary | ICD-10-CM | POA: Diagnosis not present

## 2018-05-29 DIAGNOSIS — R109 Unspecified abdominal pain: Secondary | ICD-10-CM | POA: Diagnosis not present

## 2018-05-29 LAB — COMPREHENSIVE METABOLIC PANEL
ALBUMIN: 3.6 g/dL (ref 3.5–5.0)
ALT: 11 U/L (ref 0–44)
AST: 27 U/L (ref 15–41)
Alkaline Phosphatase: 45 U/L (ref 38–126)
Anion gap: 8 (ref 5–15)
BILIRUBIN TOTAL: 0.6 mg/dL (ref 0.3–1.2)
BUN: 11 mg/dL (ref 8–23)
CO2: 22 mmol/L (ref 22–32)
Calcium: 8.4 mg/dL — ABNORMAL LOW (ref 8.9–10.3)
Chloride: 104 mmol/L (ref 98–111)
Creatinine, Ser: 1.41 mg/dL — ABNORMAL HIGH (ref 0.61–1.24)
GFR calc Af Amer: 56 mL/min — ABNORMAL LOW (ref >60)
GFR calc non Af Amer: 49 mL/min — ABNORMAL LOW (ref >60)
GLUCOSE: 162 mg/dL — AB (ref 70–99)
Potassium: 3.5 mmol/L (ref 3.5–5.1)
Sodium: 134 mmol/L — ABNORMAL LOW (ref 135–145)
Total Protein: 6.5 g/dL (ref 6.5–8.1)

## 2018-05-29 LAB — CBC
HCT: 33.9 % — ABNORMAL LOW (ref 39.0–52.0)
Hemoglobin: 11.1 g/dL — ABNORMAL LOW (ref 13.0–17.0)
MCH: 28.5 pg (ref 26.0–34.0)
MCHC: 32.7 g/dL (ref 30.0–36.0)
MCV: 87.1 fL (ref 80.0–100.0)
Platelets: 149 10*3/uL — ABNORMAL LOW (ref 150–400)
RBC: 3.89 MIL/uL — ABNORMAL LOW (ref 4.22–5.81)
RDW: 14.8 % (ref 11.5–15.5)
WBC: 2.9 10*3/uL — ABNORMAL LOW (ref 4.0–10.5)
nRBC: 0 % (ref 0.0–0.2)

## 2018-05-29 LAB — GLUCOSE, CAPILLARY
Glucose-Capillary: 78 mg/dL (ref 70–99)
Glucose-Capillary: 80 mg/dL (ref 70–99)
Glucose-Capillary: 80 mg/dL (ref 70–99)

## 2018-05-29 LAB — HEMOGLOBIN A1C
Hgb A1c MFr Bld: 7.3 % — ABNORMAL HIGH (ref 4.8–5.6)
Mean Plasma Glucose: 162.81 mg/dL

## 2018-05-29 LAB — CBG MONITORING, ED: Glucose-Capillary: 98 mg/dL (ref 70–99)

## 2018-05-29 MED ORDER — DICYCLOMINE HCL 10 MG PO CAPS: 20.0000 mg | ORAL_CAPSULE | Freq: Three times a day (TID) | ORAL | Status: DC

## 2018-05-29 MED ORDER — ONDANSETRON HCL 4 MG PO TABS: 4.0000 mg | ORAL_TABLET | Freq: Four times a day (QID) | ORAL | Status: DC | PRN

## 2018-05-29 MED ORDER — SERTRALINE HCL 50 MG PO TABS
50.0000 mg | ORAL_TABLET | Freq: Every day | ORAL | Status: DC
  Administered 2018-05-29 – 2018-06-06 (×8): 50 mg via ORAL
  Filled 2018-05-29 (×9): qty 1

## 2018-05-29 MED ORDER — APIXABAN 5 MG PO TABS
5.0000 mg | ORAL_TABLET | Freq: Two times a day (BID) | ORAL | Status: DC
  Administered 2018-05-29 – 2018-06-06 (×15): 5 mg via ORAL
  Filled 2018-05-29 (×16): qty 1

## 2018-05-29 MED ORDER — ONDANSETRON HCL 4 MG/2ML IJ SOLN
4.0000 mg | Freq: Four times a day (QID) | INTRAMUSCULAR | Status: DC | PRN
  Administered 2018-05-30: 4 mg via INTRAVENOUS
  Filled 2018-05-29: qty 2

## 2018-05-29 MED ORDER — GABAPENTIN 400 MG PO CAPS
400.0000 mg | ORAL_CAPSULE | Freq: Three times a day (TID) | ORAL | Status: DC
  Administered 2018-05-29 – 2018-06-01 (×9): 400 mg via ORAL
  Filled 2018-05-29 (×9): qty 1

## 2018-05-29 MED ORDER — SUCRALFATE 1 GM/10ML PO SUSP
1.0000 g | Freq: Three times a day (TID) | ORAL | Status: DC
  Administered 2018-05-29 – 2018-06-06 (×28): 1 g via ORAL
  Filled 2018-05-29 (×30): qty 10

## 2018-05-29 MED ORDER — INSULIN ASPART 100 UNIT/ML ~~LOC~~ SOLN
0.0000 [IU] | Freq: Three times a day (TID) | SUBCUTANEOUS | Status: DC
  Administered 2018-06-01: 1 [IU] via SUBCUTANEOUS
  Administered 2018-06-03: 2 [IU] via SUBCUTANEOUS
  Administered 2018-06-03: 1 [IU] via SUBCUTANEOUS
  Administered 2018-06-03: 2 [IU] via SUBCUTANEOUS
  Administered 2018-06-04 – 2018-06-06 (×4): 1 [IU] via SUBCUTANEOUS

## 2018-05-29 MED ORDER — ROSUVASTATIN CALCIUM 20 MG PO TABS
20.0000 mg | ORAL_TABLET | Freq: Every day | ORAL | Status: DC
  Administered 2018-05-29 – 2018-06-06 (×9): 20 mg via ORAL
  Filled 2018-05-29 (×10): qty 1

## 2018-05-29 MED ORDER — PANTOPRAZOLE SODIUM 40 MG PO TBEC
40.0000 mg | DELAYED_RELEASE_TABLET | Freq: Every day | ORAL | Status: DC
  Administered 2018-05-29 – 2018-06-06 (×9): 40 mg via ORAL
  Filled 2018-05-29 (×9): qty 1

## 2018-05-29 MED ORDER — ALBUTEROL SULFATE (2.5 MG/3ML) 0.083% IN NEBU: 2.5000 mg | INHALATION_SOLUTION | Freq: Four times a day (QID) | RESPIRATORY_TRACT | Status: DC | PRN

## 2018-05-29 MED ORDER — LISINOPRIL 5 MG PO TABS
5.0000 mg | ORAL_TABLET | Freq: Every day | ORAL | Status: DC
  Administered 2018-05-30 – 2018-06-01 (×3): 5 mg via ORAL
  Filled 2018-05-29 (×4): qty 1

## 2018-05-29 MED ORDER — MOMETASONE FURO-FORMOTEROL FUM 200-5 MCG/ACT IN AERO
2.0000 | INHALATION_SPRAY | Freq: Two times a day (BID) | RESPIRATORY_TRACT | Status: DC
  Administered 2018-05-29 – 2018-06-06 (×14): 2 via RESPIRATORY_TRACT
  Filled 2018-05-29 (×2): qty 8.8

## 2018-05-29 MED ORDER — SODIUM CHLORIDE 0.9 % IV SOLN
INTRAVENOUS | Status: DC
  Administered 2018-05-29: 13:00:00 via INTRAVENOUS

## 2018-05-29 MED ORDER — ALBUTEROL SULFATE HFA 108 (90 BASE) MCG/ACT IN AERS
1.0000 | INHALATION_SPRAY | Freq: Four times a day (QID) | RESPIRATORY_TRACT | Status: DC | PRN
  Filled 2018-05-29: qty 6.7

## 2018-05-29 MED ORDER — DICYCLOMINE HCL 10 MG PO CAPS
20.0000 mg | ORAL_CAPSULE | Freq: Three times a day (TID) | ORAL | Status: DC
  Administered 2018-05-29 – 2018-06-06 (×21): 20 mg via ORAL
  Filled 2018-05-29 (×23): qty 2

## 2018-05-29 MED ORDER — SODIUM CHLORIDE 0.9 % IV SOLN
1.0000 g | INTRAVENOUS | Status: DC
  Administered 2018-05-29: 1 g via INTRAVENOUS
  Filled 2018-05-29 (×2): qty 10

## 2018-05-29 MED ORDER — EZETIMIBE 10 MG PO TABS
10.0000 mg | ORAL_TABLET | Freq: Every day | ORAL | Status: DC
  Administered 2018-05-29 – 2018-06-06 (×8): 10 mg via ORAL
  Filled 2018-05-29 (×10): qty 1

## 2018-05-29 MED ORDER — ROPINIROLE HCL 1 MG PO TABS
3.0000 mg | ORAL_TABLET | Freq: Four times a day (QID) | ORAL | Status: DC
  Administered 2018-05-29 – 2018-06-01 (×11): 3 mg via ORAL
  Filled 2018-05-29 (×17): qty 3

## 2018-05-29 MED ORDER — DICYCLOMINE HCL 20 MG PO TABS
20.0000 mg | ORAL_TABLET | Freq: Three times a day (TID) | ORAL | Status: DC
  Filled 2018-05-29 (×4): qty 1

## 2018-05-29 MED ORDER — SODIUM CHLORIDE 0.9 % IV BOLUS
500.0000 mL | Freq: Once | INTRAVENOUS | Status: AC
  Administered 2018-05-29: 500 mL via INTRAVENOUS

## 2018-05-29 NOTE — Progress Notes (Signed)
Patient had low bp.  Notified mid-level and order for bolus given.  Patient blood pressure increased.  Patient resting comfortably with no complaints.  Will continue to monitor patient.

## 2018-05-29 NOTE — H&P (Signed)
TRH H&P    Patient Demographics:    Matthew Wilkins, is a 73 y.o. male  MRN: [id number]  DOB - 05-Dec-1945  Admit Date - 05/28/2018  Referring MD/NP/PA: Nanda Quinton  Outpatient Primary MD for the patient is [name], MD  Patient coming from: Home  Chief complaint-generalized body aches   HPI:    Matthew Wilkins  is a 73 y.o. 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  Today comes back to the hospital with generalized body aches. He denies chest pain, shortness of breath.  No cough. No nausea vomiting or diarrhea. Denies dysuria. In the ED CT scan of the abdomen and pelvis was done which showed increased enhancement of the right renal pelvis and 2.3 cm stone in the proximal ureter.  UA did not show any significant abnormality.  Dr. Laverta Baltimore discussed with urologist on-call Dr. Lovena Neighbours who recommended empiric antibiotics, follow urine cultures and patient can follow-up with urology as outpatient.  No active intervention needed in the hospital.     Review of systems:    In addition to the HPI above,   All other systems reviewed and are negative.    Past History of the following :    Past Medical History:  Diagnosis Date  . Adenocarcinoma of right lung, stage 3 ([place]) 11/17/2016  . Anxiety   . Arthritis   . Coronary artery disease   . Diabetes mellitus   . Diverticulitis   . DVT (deep venous thrombosis) ([place])   . Encounter for antineoplastic chemotherapy 11/17/2016  . GERD (gastroesophageal reflux disease)   . History of radiation therapy 11/28/16-01/06/17   right lung was treated to 60 Gy in 30 fractions  . Hypercholesterolemia   . Hypertension   . PAD (peripheral artery disease) ([place])   . SOB  (shortness of breath)       Past Surgical History:  Procedure Laterality Date  . CARDIAC CATHETERIZATION N/A 06/10/2015   Procedure: Left Heart Cath and Coronary Angiography;  Surgeon: Burnell Blanks, MD;  Location: [facility] CV LAB;  Service: Cardiovascular;  Laterality: N/A;  . LOWER EXTREMITY ANGIOGRAM N/A 07/13/2011   Procedure: LOWER EXTREMITY ANGIOGRAM;  Surgeon: Burnell Blanks, MD;  Location: [facility] CATH LAB;  Service: Cardiovascular;  Laterality: N/A;  . OTHER SURGICAL HISTORY    . OTHER SURGICAL HISTORY    . OTHER SURGICAL HISTORY    . PERCUTANEOUS STENT INTERVENTION Right 07/13/2011   Procedure: PERCUTANEOUS STENT INTERVENTION;  Surgeon: Burnell Blanks, MD;  Location: [facility] CATH LAB;  Service: Cardiovascular;  Laterality: Right;      Social History:      Social History   Tobacco Use  . Smoking status: Current Every Day Smoker    Packs/day: 1.00    Years: 65.00    Pack years: 65.00    Types: Cigarettes  . Smokeless tobacco: Never Used  Substance Use  Topics  . Alcohol use: No       Family History :     Family History  Family history unknown: Yes      Home Medications:   Prior to Admission medications   Medication Sig Start Date End Date Taking? Authorizing Provider  albuterol (PROVENTIL HFA;VENTOLIN HFA) 108 (90 Base) MCG/ACT inhaler Inhale 1-2 puffs into the lungs every 6 (six) hours as needed for wheezing or shortness of breath.  04/21/17  Yes [provider]  apixaban (ELIQUIS) 5 MG TABS tablet Take 1 tablet (5 mg total) by mouth 2 (two) times daily. 07/17/17  Yes Dhungel, Nishant, MD  dicyclomine (BENTYL) 20 MG tablet Take 1 tablet by mouth 3 (three) times daily. 10/26/17  Yes [provider]  Fluticasone-Salmeterol (ADVAIR DISKUS) 250-50 MCG/DOSE AEPB Inhale 1 puff into the lungs every 12 (twelve) hours.  04/21/17  Yes [provider]  gabapentin (NEURONTIN) 400 MG capsule Take 400 mg by mouth 3 (three) times daily.   04/19/16  Yes [provider]  glipiZIDE (GLUCOTROL XL) 5 MG 24 hr tablet Take 5 mg by mouth daily with breakfast.  03/11/15  Yes [provider]  lisinopril (PRINIVIL,ZESTRIL) 5 MG tablet Take 5 mg by mouth daily.   Yes [provider]  metFORMIN (GLUCOPHAGE-XR) 500 MG 24 hr tablet Take 2 tablets by mouth 2 (two) times daily. 04/19/16  Yes [provider]  omeprazole (PRILOSEC) 40 MG capsule Take 40 mg by mouth daily.   Yes [provider]  ondansetron (ZOFRAN) 4 MG tablet Take 1 tablet (4 mg total) by mouth every 6 (six) hours as needed. 02/21/18  Yes Julianne Rice, MD  rOPINIRole (REQUIP) 3 MG tablet Take 3 mg by mouth 4 (four) times daily.   Yes [provider]  rosuvastatin (CRESTOR) 20 MG tablet Take 1 tablet (20 mg total) by mouth daily. 10/07/14  Yes Burnell Blanks, MD  sertraline (ZOLOFT) 50 MG tablet Take 50 mg by mouth daily.  10/27/16  Yes [provider]  sucralfate (CARAFATE) 1 GM/10ML suspension Take 10 mLs (1 g total) by mouth 4 (four) times daily -  with meals and at bedtime. 02/24/18  Yes Annita Brod, MD  tiotropium (SPIRIVA HANDIHALER) 18 MCG inhalation capsule Place 1 capsule (18 mcg total) into inhaler and inhale daily. 04/25/18 07/24/18 Yes Barton Dubois, MD  ZETIA 10 MG tablet Take 10 mg by mouth daily. 04/03/15  Yes [provider]  HYDROcodone-acetaminophen (NORCO/VICODIN) 5-325 MG tablet 1 tab PO q12 hours prn pain Patient not taking: Reported on 04/25/2018 04/17/18   Francine Graven, DO  predniSONE (DELTASONE) 20 MG tablet Take 2 tablets by mouth daily x2 days; then 1 tablet by mouth daily x3 days; then half tablet by mouth daily x3 days and stop prednisone. Patient not taking: Reported on 05/28/2018 04/25/18   Barton Dubois, MD     Allergies:     Allergies  Allergen Reactions  . Aspirin Other (See Comments)    Nose bleeds      Physical Exam:   Vitals  Blood pressure (!)  126/91, pulse 89, temperature (!) 97.5 F (36.4 C), temperature source Oral, resp. rate 20, height 5\' 9"  (1.753 m), weight 68 kg, SpO2 100 %.  1.  General: Appears in no acute distress  2. Psychiatric: Alert, oriented x3, intact judgment and insight  3. Neurologic: Cranial nerve II through XII grossly intact, motor strength 5/5 in all extremities no sensory deficit noted  4. HEENMT:  Oral mucosa is moist, PERRLA  5. Respiratory : Clear to auscultation bilaterally, no wheezing or crackles auscultated  6. Cardiovascular : S1-S2, regular, no murmur auscultated, no peripheral edema  7. Gastrointestinal:  Soft, mild generalized abdominal tenderness, positive CVA tenderness bilaterally left more than right  8. Skin:  No rash noted      Data Review:    CBC Recent Labs  Lab 05/28/18 1919  WBC 3.2*  HGB 11.9*  HCT 38.0*  PLT 181  MCV 88.4  MCH 27.7  MCHC 31.3  RDW 15.1  LYMPHSABS 0.9  MONOABS 0.3  EOSABS 0.3  BASOSABS 0.1   ------------------------------------------------------------------------------------------------------------------  Results for orders placed or performed during the hospital encounter of 05/28/18 (from the past 48 hour(s))  CBG monitoring, ED     Status: Abnormal   Collection Time: 05/28/18  5:51 PM  Result Value Ref Range   Glucose-Capillary 111 (H) 70 - 99 mg/dL  CBC with Differential     Status: Abnormal   Collection Time: 05/28/18  7:19 PM  Result Value Ref Range   WBC 3.2 (L) 4.0 - 10.5 K/uL   RBC 4.30 4.22 - 5.81 MIL/uL   Hemoglobin 11.9 (L) 13.0 - 17.0 g/dL   HCT 38.0 (L) 39.0 - 52.0 %   MCV 88.4 80.0 - 100.0 fL   MCH 27.7 26.0 - 34.0 pg   MCHC 31.3 30.0 - 36.0 g/dL   RDW 15.1 11.5 - 15.5 %   Platelets 181 150 - 400 K/uL   nRBC 0.0 0.0 - 0.2 %   Neutrophils Relative % 54 %   Neutro Abs 1.7 1.7 - 7.7 K/uL   Lymphocytes Relative 27 %   Lymphs Abs 0.9 0.7 - 4.0 K/uL   Monocytes Relative 8 %   Monocytes Absolute 0.3 0.1 - 1.0  K/uL   Eosinophils Relative 8 %   Eosinophils Absolute 0.3 0.0 - 0.5 K/uL   Basophils Relative 3 %   Basophils Absolute 0.1 0.0 - 0.1 K/uL   Immature Granulocytes 0 %   Abs Immature Granulocytes 0.01 0.00 - 0.07 K/uL    Comment: Performed at [facility], [address] [place], [place] [address]  Comprehensive metabolic panel     Status: Abnormal   Collection Time: 05/28/18  7:19 PM  Result Value Ref Range   Sodium 134 (L) 135 - 145 mmol/L   Potassium 3.5 3.5 - 5.1 mmol/L   Chloride 103 98 - 111 mmol/L   CO2 23 22 - 32 mmol/L   Glucose, Bld 147 (H) 70 - 99 mg/dL   BUN 11 8 - 23 mg/dL   Creatinine, Ser 1.54 (H) 0.61 - 1.24 mg/dL   Calcium 8.4 (L) 8.9 - 10.3 mg/dL   Total Protein 7.3 6.5 - 8.1 g/dL   Albumin 4.1 3.5 - 5.0 g/dL   AST 31 15 - 41 U/L   ALT 11 0 - 44 U/L   Alkaline Phosphatase 48 38 - 126 U/L   Total Bilirubin 0.8 0.3 - 1.2 mg/dL   GFR calc non Af Amer 44 (L) >60 mL/min   GFR calc Af Amer 51 (L) >60 mL/min   Anion gap 8 5 - 15    Comment: Performed at [facility], [address] [place], [place] [address]  Influenza panel by PCR (type A & B)     Status: None   Collection Time: 05/28/18  7:29 PM  Result Value Ref Range   Influenza A By PCR NEGATIVE NEGATIVE  Influenza B By PCR NEGATIVE NEGATIVE    Comment: (NOTE) The Xpert Xpress Flu assay is intended as an aid in the diagnosis of  influenza and should not be used as a sole basis for treatment.  This  assay is FDA approved for nasopharyngeal swab specimens only. Nasal  washings and aspirates are unacceptable for Xpert Xpress Flu testing. Performed at [facility], [address], [place], [place] [address]   CK     Status: None   Collection Time: 05/28/18  7:53 PM  Result Value Ref Range   Total CK 345 49 - 397 U/L    Comment: Performed at [facility], [address] [place], [place] [address]  Urinalysis, Routine w reflex microscopic     Status: Abnormal   Collection Time: 05/28/18 10:28 PM  Result Value  Ref Range   Color, Urine YELLOW YELLOW   APPearance CLEAR CLEAR   Specific Gravity, Urine 1.017 1.005 - 1.030   pH 7.0 5.0 - 8.0   Glucose, UA NEGATIVE NEGATIVE mg/dL   Hgb urine dipstick LARGE (A) NEGATIVE   Bilirubin Urine NEGATIVE NEGATIVE   Ketones, ur NEGATIVE NEGATIVE mg/dL   Protein, ur NEGATIVE NEGATIVE mg/dL   Nitrite NEGATIVE NEGATIVE   Leukocytes, UA NEGATIVE NEGATIVE   RBC / HPF 21-50 0 - 5 RBC/hpf   WBC, UA 0-5 0 - 5 WBC/hpf   Bacteria, UA NONE SEEN NONE SEEN   Squamous Epithelial / LPF 0-5 0 - 5   Mucus PRESENT     Comment: Performed at [facility], [address] [place], [place] [address]  I-Stat CG4 Lactic Acid, ED     Status: None   Collection Time: 05/28/18 10:33 PM  Result Value Ref Range   Lactic Acid, Venous 0.51 0.5 - 1.9 mmol/L    Chemistries  Recent Labs  Lab 05/28/18 1919  NA 134*  K 3.5  CL 103  CO2 23  GLUCOSE 147*  BUN 11  CREATININE 1.54*  CALCIUM 8.4*  AST 31  ALT 11  ALKPHOS 48  BILITOT 0.8   ------------------------------------------------------------------------------------------------------------------  ------------------------------------------------------------------------------------------------------------------ GFR: Estimated Creatinine Clearance: 40.5 mL/min (A) (by C-G formula based on SCr of 1.54 mg/dL (H)). Liver Function Tests: Recent Labs  Lab 05/28/18 1919  AST 31  ALT 11  ALKPHOS 48  BILITOT 0.8  PROT 7.3  ALBUMIN 4.1   No results for input(s): LIPASE, AMYLASE in the last 168 hours. No results for input(s): AMMONIA in the last 168 hours. Coagulation Profile: No results for input(s): INR, PROTIME in the last 168 hours. Cardiac Enzymes: Recent Labs  Lab 05/28/18 1953  CKTOTAL 345   BNP (last 3 results) No results for input(s): PROBNP in the last 8760 hours. HbA1C: No results for input(s): HGBA1C in the last 72 hours. CBG: Recent Labs  Lab 05/28/18 1751  GLUCAP 111*   Lipid Profile: No results  for input(s): CHOL, HDL, LDLCALC, TRIG, CHOLHDL, LDLDIRECT in the last 72 hours. Thyroid Function Tests: No results for input(s): TSH, T4TOTAL, FREET4, T3FREE, THYROIDAB in the last 72 hours. Anemia Panel: No results for input(s): VITAMINB12, FOLATE, FERRITIN, TIBC, IRON, RETICCTPCT in the last 72 hours.  --------------------------------------------------------------------------------------------------------------- Urine analysis:    Component Value Date/Time   COLORURINE YELLOW 05/28/2018 2228   APPEARANCEUR CLEAR 05/28/2018 2228   LABSPEC 1.017 05/28/2018 2228   PHURINE 7.0 05/28/2018 2228   GLUCOSEU NEGATIVE 05/28/2018 2228   HGBUR LARGE (A) 05/28/2018 2228   BILIRUBINUR NEGATIVE 05/28/2018 2228   KETONESUR NEGATIVE 05/28/2018  [place] 05/28/2018 2228   UROBILINOGEN 0.2 01/27/2010 1529   NITRITE NEGATIVE 05/28/2018 2228   LEUKOCYTESUR NEGATIVE 05/28/2018 2228      Imaging Results:    Dg Chest 2 View  Result Date: 05/28/2018 CLINICAL DATA:  Generalized body aches.  Flu like symptoms. EXAM: CHEST - 2 VIEW COMPARISON:  April 24, 2018 FINDINGS: Radiation changes to the right hilum consistent with history of treated lung cancer. The cardiomediastinal silhouette is stable. No pneumothorax. No new nodules or masses. No focal infiltrates. IMPRESSION: Postradiation changes to the right lung.  No acute infiltrate. Electronically Signed   By: Dorise Bullion III M.D   On: 05/28/2018 18:54   Ct Abdomen Pelvis W Contrast  Result Date: 05/28/2018 CLINICAL DATA:  Generalized body aches, back and leg pain. EXAM: CT ABDOMEN AND PELVIS WITH CONTRAST TECHNIQUE: Multidetector CT imaging of the abdomen and pelvis was performed using the standard protocol following bolus administration of intravenous contrast. CONTRAST:  144mL ISOVUE-300 IOPAMIDOL (ISOVUE-300) INJECTION 61% COMPARISON:  Body CT 04/17/2018 FINDINGS: Lower chest: Normal heart size. Heavy calcific atherosclerotic  disease of the coronary arteries. Persistent patchy peribronchial airspace thickening in the right lower lobe, partially visualized. Small right pleural effusion. Small hiatal hernia. Hepatobiliary: No focal liver abnormality is seen. No gallstones, gallbladder wall thickening, or biliary dilatation. Pancreas: Unremarkable. No pancreatic ductal dilatation or surrounding inflammatory changes. Spleen: Normal in size without focal abnormality. Adrenals/Urinary Tract: Normal appearance of the adrenal glands and left kidney. Re demonstrated is 2.3 cm calculus in the posterior right pelvis. There stable prominence of the right collecting system with hyperenhancement of the urothelium at the level of the renal pelvis and proximal ureter. There is a second nonobstructive calculus in the superior pole of the right kidney measuring 7 mm. There is a 2 mm bladder calculus in the region of the right trigone. 13 mm mid polar right renal cyst. Stomach/Bowel: Stomach is within normal limits. Appendix appears normal. No evidence of bowel wall thickening, distention, or inflammatory changes. Scattered colonic diverticulosis. Vascular/Lymphatic: Aortic atherosclerosis. No enlarged abdominal or pelvic lymph nodes. Reproductive: Nonspecific enlargement of the prostate gland. Other: No abdominal wall hernia or abnormality. No abdominopelvic ascites. Musculoskeletal: Spondylosis of the lumbosacral spine. IMPRESSION: 2.3 cm calculus within the right renal pelvis. Hyperenhancement of the urothelium of the right renal pelvis and proximal right ureter, query infectious in etiology. Please correlate to urinalysis. 2 mm nonobstructive calculus within the right trigone region of the urinary bladder. Colonic diverticulosis without evidence of diverticulitis. Partially visualized peribronchial airspace thickening in the right lung base thought to represent posttreatment/postradiation changes. Persistent small right pleural effusion. Calcific  atherosclerotic disease of the aorta and coronary arteries. Electronically Signed   By: Fidela Salisbury M.D.   On: 05/28/2018 22:17     Assessment & Plan:    Active Problems:   UTI (urinary tract infection)   1. ?  Pyonephritis-CT scan shows 2.3 cm calculus within the right renal pelvis, hyperenhancement of the urothelium of the right renal pelvis and proximal right ureter question infectious etiology.  Patient started on ceftriaxone.  Follow urine culture results.  UA is clear.  2. Right nephrolithiasis-CT scan shows 2.3 cm cluster in the right renal pelvis.  Patient will follow up with urology as outpatient  3. History of DVT/PE-continue Eliquis  4. Stage III adenocarcinoma of right lung-followed by Dr. Julien Nordmann from oncology.   5. Hyperlipidemia-continue Zetia, Crestor  6. Diabetes mellitus type 2-start sliding scale insulin with NovoLog.  DVT Prophylaxis-   Eliquis  AM Labs Ordered, also please review Full Orders  Family Communication: Admission, patients condition and plan of care including tests being ordered have been discussed with the patient and his daughter at bedside* who indicate understanding and agree with the plan and Code Status.  Code Status: Full code  Admission status: Observation: Based on patients clinical presentation and evaluation of above clinical data, I have made determination that patient will need less than 2 midnight stay in the hospital.  Admitted for possible UTI.  Started on IV Rocephin.  Follow urine culture results.    Time spent in minutes : 60 minutes.    Oswald Hillock M.D on 05/29/2018 at 2:54 AM  Between 7am to 7pm - Pager - [phone]. After 7pm go to [email or web address] - password [email or web address]   [facility] - Office  [phone]

## 2018-05-29 NOTE — Care Management Obs Status (Signed)
[place] NOTIFICATION   Patient Details  Name: Matthew Wilkins MRN: [id number] Date of Birth: 1945/07/23   Medicare Observation Status Notification Given:  Yes    Sherald Barge, RN 05/29/2018, 8:59 AM

## 2018-05-29 NOTE — Progress Notes (Signed)
Patient was admitted earlier today.  As per the H&P done earlier today -"Matthew Wilkins  is a 73 y.o. 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  Today comes back to the hospital with generalized body aches.  He denies chest pain, shortness of breath.  No cough.  No nausea vomiting or diarrhea. Denies dysuria.  In the ED CT scan of the abdomen and pelvis was done which showed increased enhancement of the right renal pelvis and 2.3 cm stone in the proximal ureter.  UA did not show any significant abnormality.  Dr. Laverta Baltimore discussed with urologist on-call Dr. Lovena Neighbours who recommended empiric antibiotics, follow urine cultures and patient can follow-up with urology as outpatient.  No active intervention needed in the hospital".  - UA reveals presence of blood, likely secondary to nephrolithiasis.  Patient is also on anticoagulation.  CAT scan abdomen pelvis findings noted. -Patient may be failing to thrive. -Viral syndrome is possible -Cytopenia is noted.  Repeat CBC in the morning. -CKD trace at baseline.  PT OT consult Patient may need placement, as we may be dealing with a case of failure to thrive.  Patient is a poor historian.

## 2018-05-30 ENCOUNTER — Observation Stay (HOSPITAL_COMMUNITY): Payer: Medicare HMO

## 2018-05-30 DIAGNOSIS — N390 Urinary tract infection, site not specified: Secondary | ICD-10-CM | POA: Diagnosis not present

## 2018-05-30 LAB — URINE CULTURE
Culture: NO GROWTH
Special Requests: NORMAL

## 2018-05-30 LAB — GLUCOSE, CAPILLARY
Glucose-Capillary: 74 mg/dL (ref 70–99)
Glucose-Capillary: 75 mg/dL (ref 70–99)
Glucose-Capillary: 79 mg/dL (ref 70–99)
Glucose-Capillary: 85 mg/dL (ref 70–99)

## 2018-05-30 MED ORDER — SODIUM CHLORIDE 0.9 % IV SOLN
INTRAVENOUS | Status: AC
  Administered 2018-05-30: 12:00:00 via INTRAVENOUS

## 2018-05-30 MED ORDER — ALUM & MAG HYDROXIDE-SIMETH 200-200-20 MG/5ML PO SUSP
30.0000 mL | Freq: Four times a day (QID) | ORAL | Status: DC | PRN
  Administered 2018-05-30: 30 mL via ORAL
  Filled 2018-05-30: qty 30

## 2018-05-30 NOTE — Progress Notes (Signed)
PT Cancellation Note  Patient Details Name: Matthew Wilkins MRN: [id number] DOB: 11/16/1945   Cancelled Treatment:    Reason Eval/Treat Not Completed: Patient declined, no reason specified. Patient refused secondary to not feeling well. Will check on patient tomorrow.  1:59 PM, 05/30/18 Talbot Grumbling, PT, DPT Physical Therapist with [facility] [phone] mobile phone

## 2018-05-30 NOTE — Progress Notes (Signed)
OT Cancellation Note  Patient Details Name: Matthew Wilkins MRN: [id number] DOB: [birth year]   Cancelled Treatment:    Reason Eval/Treat Not Completed: Other (comment). Attempted to see pt this am, pt initially agreeable to evaluation however then refused to complete any mobility or functional tasks. Pt lives alone, daughter checks in and helps with cleaning and meal preparation, pt reports independence in ADLs and driving. Pt uses quad cane for functional mobility, has a rollator, wheelchair, shower seat, grab bars, and a hospital bed. Will attempt to evaluate pt at a later time  Guadelupe Sabin, OTR/L  [phone] 05/30/2018, 8:32 AM

## 2018-05-30 NOTE — Progress Notes (Signed)
Patient refused to attempt to void during shift.  Patient stated he did not have to go, and did not want to try to go at this time.  Patient stated he would let staff know when he had to use the restroom.

## 2018-05-30 NOTE — Progress Notes (Signed)
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 -Patient is a very poor historian, Reports originally coming to the hospital yesterday with body aches leg pain, now tells me that his left leg has been weaker and have heavier for the last several weeks. -Lives independently, uses a walker and cane -Reason ended up getting a CT scan of his abdomen pelvis in the emergency room on admission yesterday which noted a 2.3 cm stone in the proximal ureter and some enhancement of the right renal pelvis, case was discussed with urology Dr.Winter, who recommended antibiotics and outpatient follow-up for definitive stone management   Assessment & Plan:   Right renal pelvis/proximal ureter stone with ?pyelonephritis -Symptoms do not really point to this however patient is a terrible historian -On day 2 of empiric ceftriaxone, gentle IV fluids today UA only notable for hematuria -Urine culture is negative, will stop antibiotics and monitor -Urology follow-up -No symptoms of sepsis  Left leg weakness -history is limited and reports worsening weakness more on the left side over the last 2 weeks -Patient is on Eliquis hence embolic stroke would be less likely however his compliance is uncertain -We will check MRI brain -Other possibility is worsening peripheral neuropathy from chemotherapy for lung cancer -PT OT eval  History of DVT/PE -Continue Eliquis  History of stage III adeno CA lung -Followed by Dr. Earlie Server -Had been treated with chemoradiation followed by consolidation immunotherapy however significant difficulties with going back and forth to the clinic and getting treatment, currently  immunotherapy is on hold   Diabetes mellitus -On metformin and glipizide at home, continue sliding scale insulin for now  I also suspect cognitive dysfunction and worsening debility -Await PT OT eval -Would definitely benefit from more support at home   DVT prophylaxis: Apixaban Code Status: Full code Family Communication: No family at bedside Disposition Plan: To be determined  Consultants:   D/w Urology Dr.Winter on admission   Procedures:   Antimicrobials:    Subjective: -Feels weak overall, worsening left leg weakness for few weeks per patient, also some aches and pains  Objective: Vitals:   05/29/18 2145 05/30/18 0000 05/30/18 0558 05/30/18 0738  BP: (!) 77/51 (!) 90/53 (!) 107/57   Pulse: 69 85 68   Resp:      Temp: (!) 97.4 F (36.3 C)  97.9 F (36.6 C)   TempSrc: Oral  Oral   SpO2: 93%  96% 91%  Weight:      Height:        Intake/Output Summary (Last 24 hours) at 05/30/2018 1146 Last data filed at 05/30/2018 0700 Gross per 24 hour  Intake 1302.84 ml  Output 550 ml  Net 752.84 ml   Filed Weights   05/28/18 1748  Weight: 68 kg    Examination:  General exam: Elderly frail chronically ill-appearing male, laying in bed, no distress Respiratory system: Poor air movement bilaterally rest clear Cardiovascular system: S1 & S2 heard, RRR. Gastrointestinal system: Abdomen is nondistended, soft and nontender.Normal bowel sounds heard. Central nervous system: Alert and oriented x2, 4 out of 5 left leg weakness, all other extremities are 4+, no pronator drift drift Extremities: Edema Skin: No rashes, lesions or ulcers Psychiatry: Poor insight and judgment, Mood &  affect appropriate.     Data Reviewed:   CBC: Recent Labs  Lab 05/28/18 1919 05/29/18 0459  WBC 3.2* 2.9*  NEUTROABS 1.7  --   HGB 11.9* 11.1*  HCT 38.0* 33.9*  MCV 88.4 87.1  PLT 181 443*   Basic Metabolic Panel: Recent Labs  Lab 05/28/18 1919 05/29/18 0459  NA 134* 134*  K 3.5  3.5  CL 103 104  CO2 23 22  GLUCOSE 147* 162*  BUN 11 11  CREATININE 1.54* 1.41*  CALCIUM 8.4* 8.4*   GFR: Estimated Creatinine Clearance: 44.2 mL/min (A) (by C-G formula based on SCr of 1.41 mg/dL (H)). Liver Function Tests: Recent Labs  Lab 05/28/18 1919 05/29/18 0459  AST 31 27  ALT 11 11  ALKPHOS 48 45  BILITOT 0.8 0.6  PROT 7.3 6.5  ALBUMIN 4.1 3.6   No results for input(s): LIPASE, AMYLASE in the last 168 hours. No results for input(s): AMMONIA in the last 168 hours. Coagulation Profile: No results for input(s): INR, PROTIME in the last 168 hours. Cardiac Enzymes: Recent Labs  Lab 05/28/18 1953  CKTOTAL 345   BNP (last 3 results) No results for input(s): PROBNP in the last 8760 hours. HbA1C: Recent Labs    05/28/18 1953  HGBA1C 7.3*   CBG: Recent Labs  Lab 05/29/18 1210 05/29/18 1651 05/29/18 2144 05/30/18 0732 05/30/18 1133  GLUCAP 78 80 80 74 75   Lipid Profile: No results for input(s): CHOL, HDL, LDLCALC, TRIG, CHOLHDL, LDLDIRECT in the last 72 hours. Thyroid Function Tests: No results for input(s): TSH, T4TOTAL, FREET4, T3FREE, THYROIDAB in the last 72 hours. Anemia Panel: No results for input(s): VITAMINB12, FOLATE, FERRITIN, TIBC, IRON, RETICCTPCT in the last 72 hours. Urine analysis:    Component Value Date/Time   COLORURINE YELLOW 05/28/2018 2228   APPEARANCEUR CLEAR 05/28/2018 2228   LABSPEC 1.017 05/28/2018 2228   PHURINE 7.0 05/28/2018 2228   GLUCOSEU NEGATIVE 05/28/2018 2228   HGBUR LARGE (A) 05/28/2018 2228   BILIRUBINUR NEGATIVE 05/28/2018 2228   KETONESUR NEGATIVE 05/28/2018 2228   PROTEINUR NEGATIVE 05/28/2018 2228   UROBILINOGEN 0.2 01/27/2010 1529   NITRITE NEGATIVE 05/28/2018 2228   LEUKOCYTESUR NEGATIVE 05/28/2018 2228   Sepsis Labs: @LABRCNTIP (procalcitonin:4,lacticidven:4)  ) Recent Results (from the past 240 hour(s))  Culture, blood (routine x 2)     Status: None (Preliminary result)   Collection Time: 05/28/18   7:53 PM  Result Value Ref Range Status   Specimen Description BLOOD RIGHT ARM  Final   Special Requests   Final    BOTTLES DRAWN AEROBIC AND ANAEROBIC Blood Culture adequate volume   Culture   Final    NO GROWTH 2 DAYS Performed at [facility], [address], [place], [place] [address]    Report Status PENDING  Incomplete  Culture, blood (routine x 2)     Status: None (Preliminary result)   Collection Time: 05/28/18  7:59 PM  Result Value Ref Range Status   Specimen Description BLOOD LEFT ARM  Final   Special Requests   Final    BOTTLES DRAWN AEROBIC AND ANAEROBIC Blood Culture adequate volume   Culture   Final    NO GROWTH 2 DAYS Performed at [facility], [address], [place], [place] [address]    Report Status PENDING  Incomplete  Urine culture     Status: None   Collection Time: 05/28/18 10:28 PM  Result Value Ref Range Status   Specimen Description   Final  URINE, CLEAN CATCH Performed at [facility], [address] [place], [place] [address]    Special Requests   Final    Normal Performed at [facility], [address] [place], [place] [address]    Culture   Final    NO GROWTH Performed at [facility], [place] [address] [place],  [address]    Report Status 05/30/2018 FINAL  Final         Radiology Studies: Dg Chest 2 View  Result Date: 05/28/2018 CLINICAL DATA:  Generalized body aches.  Flu like symptoms. EXAM: CHEST - 2 VIEW COMPARISON:  April 24, 2018 FINDINGS: Radiation changes to the right hilum consistent with history of treated lung cancer. The cardiomediastinal silhouette is stable. No pneumothorax. No new nodules or masses. No focal infiltrates. IMPRESSION: Postradiation changes to the right lung.  No acute infiltrate. Electronically Signed   By: Dorise Bullion III M.D   On: 05/28/2018 18:54   Ct Abdomen Pelvis W Contrast  Result Date: 05/28/2018 CLINICAL DATA:  Generalized body aches, back and leg pain. EXAM: CT ABDOMEN AND  PELVIS WITH CONTRAST TECHNIQUE: Multidetector CT imaging of the abdomen and pelvis was performed using the standard protocol following bolus administration of intravenous contrast. CONTRAST:  149mL ISOVUE-300 IOPAMIDOL (ISOVUE-300) INJECTION 61% COMPARISON:  Body CT 04/17/2018 FINDINGS: Lower chest: Normal heart size. Heavy calcific atherosclerotic disease of the coronary arteries. Persistent patchy peribronchial airspace thickening in the right lower lobe, partially visualized. Small right pleural effusion. Small hiatal hernia. Hepatobiliary: No focal liver abnormality is seen. No gallstones, gallbladder wall thickening, or biliary dilatation. Pancreas: Unremarkable. No pancreatic ductal dilatation or surrounding inflammatory changes. Spleen: Normal in size without focal abnormality. Adrenals/Urinary Tract: Normal appearance of the adrenal glands and left kidney. Re demonstrated is 2.3 cm calculus in the posterior right pelvis. There stable prominence of the right collecting system with hyperenhancement of the urothelium at the level of the renal pelvis and proximal ureter. There is a second nonobstructive calculus in the superior pole of the right kidney measuring 7 mm. There is a 2 mm bladder calculus in the region of the right trigone. 13 mm mid polar right renal cyst. Stomach/Bowel: Stomach is within normal limits. Appendix appears normal. No evidence of bowel wall thickening, distention, or inflammatory changes. Scattered colonic diverticulosis. Vascular/Lymphatic: Aortic atherosclerosis. No enlarged abdominal or pelvic lymph nodes. Reproductive: Nonspecific enlargement of the prostate gland. Other: No abdominal wall hernia or abnormality. No abdominopelvic ascites. Musculoskeletal: Spondylosis of the lumbosacral spine. IMPRESSION: 2.3 cm calculus within the right renal pelvis. Hyperenhancement of the urothelium of the right renal pelvis and proximal right ureter, query infectious in etiology. Please correlate  to urinalysis. 2 mm nonobstructive calculus within the right trigone region of the urinary bladder. Colonic diverticulosis without evidence of diverticulitis. Partially visualized peribronchial airspace thickening in the right lung base thought to represent posttreatment/postradiation changes. Persistent small right pleural effusion. Calcific atherosclerotic disease of the aorta and coronary arteries. Electronically Signed   By: Fidela Salisbury M.D.   On: 05/28/2018 22:17        Scheduled Meds: . apixaban  5 mg Oral BID  . dicyclomine  20 mg Oral TID AC  . ezetimibe  10 mg Oral Daily  . gabapentin  400 mg Oral TID  . insulin aspart  0-9 Units Subcutaneous TID WC  . lisinopril  5 mg Oral Daily  . mometasone-formoterol  2 puff Inhalation BID  . pantoprazole  40 mg Oral Daily  .  rOPINIRole  3 mg Oral QID  . rosuvastatin  20 mg Oral Daily  . sertraline  50 mg Oral Daily  . sucralfate  1 g Oral TID WC & HS   Continuous Infusions: . sodium chloride 75 mL/hr at 05/29/18 1253  . cefTRIAXone (ROCEPHIN)  IV 1 g (05/29/18 1723)     LOS: 0 days    Time spent: 34min    Domenic Polite, MD [facility] Page via [email or web address], password [email or web address] After 7PM please contact night-coverage  05/30/2018, 11:46 AM

## 2018-05-31 ENCOUNTER — Observation Stay (HOSPITAL_COMMUNITY): Payer: Medicare HMO

## 2018-05-31 DIAGNOSIS — N390 Urinary tract infection, site not specified: Secondary | ICD-10-CM | POA: Diagnosis not present

## 2018-05-31 LAB — BASIC METABOLIC PANEL
Anion gap: 7 (ref 5–15)
BUN: 8 mg/dL (ref 8–23)
CO2: 22 mmol/L (ref 22–32)
Calcium: 8.3 mg/dL — ABNORMAL LOW (ref 8.9–10.3)
Chloride: 106 mmol/L (ref 98–111)
Creatinine, Ser: 0.96 mg/dL (ref 0.61–1.24)
GFR calc Af Amer: >60 mL/min (ref >60)
Glucose, Bld: 81 mg/dL (ref 70–99)
Potassium: 3.6 mmol/L (ref 3.5–5.1)
Sodium: 135 mmol/L (ref 135–145)

## 2018-05-31 LAB — CBC
HCT: 35 % — ABNORMAL LOW (ref 39.0–52.0)
Hemoglobin: 11.3 g/dL — ABNORMAL LOW (ref 13.0–17.0)
MCH: 28.3 pg (ref 26.0–34.0)
MCHC: 32.3 g/dL (ref 30.0–36.0)
MCV: 87.7 fL (ref 80.0–100.0)
Platelets: 148 10*3/uL — ABNORMAL LOW (ref 150–400)
RBC: 3.99 MIL/uL — ABNORMAL LOW (ref 4.22–5.81)
RDW: 14.6 % (ref 11.5–15.5)
WBC: 3.2 10*3/uL — ABNORMAL LOW (ref 4.0–10.5)
nRBC: 0 % (ref 0.0–0.2)

## 2018-05-31 LAB — GLUCOSE, CAPILLARY
GLUCOSE-CAPILLARY: 77 mg/dL (ref 70–99)
Glucose-Capillary: 75 mg/dL (ref 70–99)
Glucose-Capillary: 103 mg/dL — ABNORMAL HIGH (ref 70–99)

## 2018-05-31 MED ORDER — ACETAMINOPHEN 325 MG PO TABS
650.0000 mg | ORAL_TABLET | Freq: Four times a day (QID) | ORAL | Status: DC | PRN
  Filled 2018-05-31: qty 2

## 2018-05-31 NOTE — Plan of Care (Signed)
  Problem: Acute Rehab OT Goals (only OT should resolve) Goal: Pt. Will Perform Eating Flowsheets (Taken 05/31/2018 1008) Pt Will Perform Eating: with modified independence; sitting Goal: Pt. Will Perform Grooming Flowsheets (Taken 05/31/2018 1008) Pt Will Perform Grooming: with min guard assist; standing Goal: Pt. Will Perform Lower Body Dressing Flowsheets (Taken 05/31/2018 1008) Pt Will Perform Lower Body Dressing: with min assist; sitting/lateral leans; sit to/from stand Goal: Pt. Will Transfer To Toilet Flowsheets (Taken 05/31/2018 1008) Pt Will Transfer to Toilet: with min guard assist; stand pivot transfer; bedside commode; ambulating; regular height toilet Goal: Pt. Will Perform Toileting-Clothing Manipulation Flowsheets (Taken 05/31/2018 1008) Pt Will Perform Toileting - Clothing Manipulation and hygiene: with supervision; sitting/lateral leans; sit to/from stand Goal: Pt/Caregiver Will Perform Home Exercise Program Flowsheets (Taken 05/31/2018 1008) Pt/caregiver will Perform Home Exercise Program: Increased strength; Both right and left upper extremity; Independently; With written HEP provided

## 2018-05-31 NOTE — Plan of Care (Signed)
  Problem: Acute Rehab PT Goals(only PT should resolve) Goal: Pt Will Go Sit To Supine/Side Outcome: Progressing Flowsheets (Taken 05/31/2018 1216) Pt will go Sit to Supine/Side: with modified independence Note:  Without verbal cues for safety Goal: Patient Will Transfer Sit To/From Stand Outcome: Progressing Flowsheets (Taken 05/31/2018 1216) Patient will transfer sit to/from stand: with supervision Goal: Pt Will Transfer Bed To Chair/Chair To Bed Outcome: Progressing Flowsheets (Taken 05/31/2018 1216) Pt will Transfer Bed to Chair/Chair to Bed: with supervision Goal: Pt Will Ambulate Outcome: Progressing Flowsheets (Taken 05/31/2018 1216) Pt will Ambulate: 50 feet; with min guard assist; with rolling walker   12:17 PM, 05/31/18 Talbot Grumbling, PT, DPT Physical Therapist with [facility] [phone] mobile phone

## 2018-05-31 NOTE — Progress Notes (Signed)
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 -Patient is a very poor historian, Reports originally coming to the hospital yesterday with body aches leg pain, now tells me that his left leg has been weaker and have heavier for the last several weeks. -Lives independently, uses a walker and cane -Reason ended up getting a CT scan of his abdomen pelvis in the emergency room on admission yesterday which noted a 2.3 cm stone in the proximal ureter and some enhancement of the right renal pelvis, case was discussed with urology Dr.Winter, who recommended antibiotics and outpatient follow-up for definitive stone management.  05/31/18: seen and examined at his bedside. No new complaints. Denies chest pain or dyspnea or palpitations.   Assessment/Plan: Active Problems:   UTI (urinary tract infection)  Right renal pelvis/proximal ureter stone with ?pyelonephritis -Symptoms do not really point to this however patient is a terrible historian -On day 3 of empiric ceftriaxone, gentle IV fluids today UA only notable for hematuria -Urine culture is negative, will stop antibiotics and monitor -Urology follow-up -No symptoms of sepsis  Left leg weakness -history is limited and reports worsening weakness more on the left side over the last 2 weeks -Patient is on Eliquis hence embolic stroke would be less likely however his compliance is uncertain -MRI brain; no acute abnormalities -Other possibility is worsening peripheral neuropathy from chemotherapy for lung cancer -PT OT eval  History of DVT/PE -Continue Eliquis  History of stage III adeno CA lung -Followed by Dr. Earlie Server -Had  been treated with chemoradiation followed by consolidation immunotherapy however significant difficulties with going back and forth to the clinic and getting treatment, currently immunotherapy is on hold   Diabetes mellitus -On metformin and glipizide at home, continue sliding scale insulin for now  I also suspect cognitive dysfunction and worsening debility -Await PT OT eval -Would definitely benefit from more support at home   DVT prophylaxis: Apixaban Code Status: Full code Family Communication: No family at bedside Disposition Plan: To be determined  Consultants:   D/w Urology Dr.Winter on admission   Procedures:   Antimicrobials:    Objective: Vitals:   05/30/18 2045 05/30/18 2126 05/31/18 0535 05/31/18 0805  BP:  108/75 134/82   Pulse:  77 81   Resp:  18 18   Temp:  (!) 97.5 F (36.4 C) 98 F (36.7 C)   TempSrc:  Oral    SpO2: 98% 98% 100% 96%  Weight:      Height:        Intake/Output Summary (Last 24 hours) at 05/31/2018 1320 Last data filed at 05/31/2018 0500 Gross per 24 hour  Intake 98.39 ml  Output 1200 ml  Net -1101.61 ml   Filed Weights   05/28/18 1748  Weight: 68 kg    Exam:  . General: 73 y.o. year-old male well developed well nourished in no acute distress.  Alert and interactive. . Cardiovascular: Regular rate and rhythm with no rubs or gallops.  No thyromegaly or JVD noted.   [name] Respiratory: Clear to auscultation with no wheezes or rales. Good inspiratory effort. . Abdomen: Soft nontender nondistended with normal bowel sounds x4 quadrants. . Musculoskeletal: Trace lower extremity edema. 2/4 pulses in all 4 extremities. [name]  Psychiatry: Mood is appropriate for condition and setting   Data Reviewed: CBC: Recent Labs  Lab 05/28/18 1919 05/29/18 0459 05/31/18 0520  WBC 3.2* 2.9* 3.2*  NEUTROABS 1.7  --   --   HGB 11.9* 11.1* 11.3*  HCT 38.0* 33.9* 35.0*  MCV 88.4 87.1 87.7  PLT 181 149* 038*   Basic Metabolic Panel: Recent  Labs  Lab 05/28/18 1919 05/29/18 0459 05/31/18 0520  NA 134* 134* 135  K 3.5 3.5 3.6  CL 103 104 106  CO2 23 22 22   GLUCOSE 147* 162* 81  BUN 11 11 8   CREATININE 1.54* 1.41* 0.96  CALCIUM 8.4* 8.4* 8.3*   GFR: Estimated Creatinine Clearance: 64.9 mL/min (by C-G formula based on SCr of 0.96 mg/dL). Liver Function Tests: Recent Labs  Lab 05/28/18 1919 05/29/18 0459  AST 31 27  ALT 11 11  ALKPHOS 48 45  BILITOT 0.8 0.6  PROT 7.3 6.5  ALBUMIN 4.1 3.6   No results for input(s): LIPASE, AMYLASE in the last 168 hours. No results for input(s): AMMONIA in the last 168 hours. Coagulation Profile: No results for input(s): INR, PROTIME in the last 168 hours. Cardiac Enzymes: Recent Labs  Lab 05/28/18 1953  CKTOTAL 345   BNP (last 3 results) No results for input(s): PROBNP in the last 8760 hours. HbA1C: Recent Labs    05/28/18 1953  HGBA1C 7.3*   CBG: Recent Labs  Lab 05/30/18 1133 05/30/18 1620 05/30/18 2041 05/31/18 0737 05/31/18 1149  GLUCAP 75 79 85 75 77   Lipid Profile: No results for input(s): CHOL, HDL, LDLCALC, TRIG, CHOLHDL, LDLDIRECT in the last 72 hours. Thyroid Function Tests: No results for input(s): TSH, T4TOTAL, FREET4, T3FREE, THYROIDAB in the last 72 hours. Anemia Panel: No results for input(s): VITAMINB12, FOLATE, FERRITIN, TIBC, IRON, RETICCTPCT in the last 72 hours. Urine analysis:    Component Value Date/Time   COLORURINE YELLOW 05/28/2018 2228   APPEARANCEUR CLEAR 05/28/2018 2228   LABSPEC 1.017 05/28/2018 2228   PHURINE 7.0 05/28/2018 2228   GLUCOSEU NEGATIVE 05/28/2018 2228   HGBUR LARGE (A) 05/28/2018 2228   BILIRUBINUR NEGATIVE 05/28/2018 2228   KETONESUR NEGATIVE 05/28/2018 2228   PROTEINUR NEGATIVE 05/28/2018 2228   UROBILINOGEN 0.2 01/27/2010 1529   NITRITE NEGATIVE 05/28/2018 2228   LEUKOCYTESUR NEGATIVE 05/28/2018 2228   Sepsis Labs: @LABRCNTIP (procalcitonin:4,lacticidven:4)  ) Recent Results (from the past 240  hour(s))  Culture, blood (routine x 2)     Status: None (Preliminary result)   Collection Time: 05/28/18  7:53 PM  Result Value Ref Range Status   Specimen Description BLOOD RIGHT ARM  Final   Special Requests   Final    BOTTLES DRAWN AEROBIC AND ANAEROBIC Blood Culture adequate volume   Culture   Final    NO GROWTH 3 DAYS Performed at [facility], [address], [place], [place] [address]    Report Status PENDING  Incomplete  Culture, blood (routine x 2)     Status: None (Preliminary result)   Collection Time: 05/28/18  7:59 PM  Result Value Ref Range Status   Specimen Description BLOOD LEFT ARM  Final   Special Requests   Final    BOTTLES DRAWN AEROBIC AND ANAEROBIC Blood Culture adequate volume   Culture   Final    NO GROWTH 3 DAYS Performed at [facility], [address] [place], [place] [address]    Report Status PENDING  Incomplete  Urine culture     Status: None   Collection  Time: 05/28/18 10:28 PM  Result Value Ref Range Status   Specimen Description   Final    URINE, CLEAN CATCH Performed at [facility], [address] [place], [place] [address]    Special Requests   Final    Normal Performed at [facility], [address] [place], [name] [address]    Culture   Final    NO GROWTH Performed at [facility], [place] [address] [place], [place] [address]    Report Status 05/30/2018 FINAL  Final      Studies: No results found.  Scheduled Meds: . apixaban  5 mg Oral BID  . dicyclomine  20 mg Oral TID AC  . ezetimibe  10 mg Oral Daily  . gabapentin  400 mg Oral TID  . insulin aspart  0-9 Units Subcutaneous TID WC  . lisinopril  5 mg Oral Daily  . mometasone-formoterol  2 puff Inhalation BID  . pantoprazole  40 mg Oral Daily  . rOPINIRole  3 mg Oral QID  . rosuvastatin  20 mg Oral Daily  . sertraline  50 mg Oral Daily  . sucralfate  1 g Oral TID WC & HS    Continuous Infusions:   LOS: 0 days     Kayleen Memos, MD [facility] Pager  [phone]  If 7PM-7AM, please contact night-coverage [email or web address] Password [email or web address] 05/31/2018, 1:20 PM

## 2018-05-31 NOTE — Evaluation (Signed)
Physical Therapy Evaluation Patient Details Name: Matthew Wilkins MRN: [id number] DOB: 1945-12-27 Today's Date: 05/31/2018   History of Present Illness  Jawann Wilkins  is a 73 y.o. 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  Today comes back to the hospital with generalized body aches.    Clinical Impression  Patient limited for functional mobility as stated below secondary to BLE weakness, fatigue and poor standing balance. Patient performs bed mobility at min guard with HOB elevated to 60 degrees and use of bed rail reporting he has a hospital bed at home and that is his "normal" transfer; no family present to confirm patient baseline status. Patient performs transfers and ambulation using RW and mod assist due to unsteadiness on feet and maintaining weight through bil heels shifting weight posterior, unable to improve with verbal and tactile cues. Patient completes steps at bedside with RW during gait training, maintaining weight through bil heels and posterior lean, unable to resolve with cues and steps. Patient impulsive throughout therapy session, confused as to why he can't return home and constantly reoriented and educated on safety with mobility. Patient left in chair with call bell in lap, chair alarm on, and nursing in room. Patient will benefit from continued physical therapy in hospital and recommended venue below to increase strength, balance, endurance for safe ADLs and gait.     Follow Up Recommendations SNF    Equipment Recommendations  None recommended by PT    Recommendations for Other Services       Precautions / Restrictions Precautions Precautions: Fall Restrictions Weight Bearing Restrictions: No      Mobility  Bed Mobility Overal bed mobility: Needs  Assistance Bed Mobility: Supine to Sit     Supine to sit: Min guard;HOB elevated     General bed mobility comments: increased time, use of bed rail  Transfers Overall transfer level: Needs assistance Equipment used: Rolling walker (2 wheeled) Transfers: Sit to/from [facility] Sit to Stand: Mod assist Stand pivot transfers: Mod assist       General transfer comment: increased time, unsteady on feet, posterior lean with weight in bil heels  Ambulation/Gait Ambulation/Gait assistance: Mod assist Gait Distance (Feet): 2 Feet Assistive device: Rolling walker (2 wheeled) Gait Pattern/deviations: Decreased step length - right;Decreased step length - left;Decreased stride length;Shuffle;Leaning posteriorly;Narrow base of support Gait velocity: decreased   General Gait Details: labored and shuffling 3-4 steps at bedside, weight posterior in bil heels, increased time, no loss of balance  Stairs            Wheelchair Mobility    Modified Rankin (Stroke Patients Only)       Balance Overall balance assessment: Needs assistance Sitting-balance support: Feet supported;Bilateral upper extremity supported Sitting balance-Leahy Scale: Poor Sitting balance - Comments: fair/poor supported sitting balance with tendency to lean posterior   Standing balance support: During functional activity;Bilateral upper extremity supported Standing balance-Leahy Scale: Poor Standing balance comment: with RW, tendency to lean posterior                             Pertinent Vitals/Pain Pain Assessment: 0-10 Pain Score: 6  Pain Location: sides (abdomen) Pain Descriptors / Indicators: Discomfort Pain Intervention(s): Limited activity within patient's tolerance;Monitored during session;Repositioned    Home Living Family/patient expects to be  discharged to:: Private residence Living Arrangements: Alone Available Help at Discharge: Family;Neighbor;Available  PRN/intermittently Type of Home: House Home Access: Level entry     Home Layout: One level Home Equipment: Cane - single point;Walker - 4 wheels;Wheelchair - [employer];Hospital bed;Shower seat;Cane - quad      Prior Function Level of Independence: Independent with assistive device(s)         Comments: household and community ambulator with quad cane, drives and Ind with shopping and ADLs     Hand Dominance   Dominant Hand: Right    Extremity/Trunk Assessment   Upper Extremity Assessment Upper Extremity Assessment: Defer to OT evaluation    Lower Extremity Assessment Lower Extremity Assessment: Generalized weakness    Cervical / Trunk Assessment Cervical / Trunk Assessment: Normal  Communication   Communication: Expressive difficulties(somewhat garbled speech)  Cognition Arousal/Alertness: Awake/alert Behavior During Therapy: Impulsive Overall Cognitive Status: No family/caregiver present to determine baseline cognitive functioning                                        General Comments      Exercises     Assessment/Plan    PT Assessment Patient needs continued PT services  PT Problem List Decreased strength;Decreased activity tolerance;Decreased balance;Decreased mobility       PT Treatment Interventions Gait training;Functional mobility training;Therapeutic activities;Therapeutic exercise;Patient/family education    PT Goals (Current goals can be found in the Care Plan section)  Acute Rehab PT Goals Patient Stated Goal: return home PT Goal Formulation: With patient Time For Goal Achievement: 06/14/18 Potential to Achieve Goals: Good    Frequency Min 2X/week   Barriers to discharge        Co-evaluation PT/OT/SLP Co-Evaluation/Treatment: Yes Reason for Co-Treatment: Complexity of the patient's impairments (multi-system involvement);To address functional/ADL transfers PT goals addressed during session: Mobility/safety  with mobility OT goals addressed during session: ADL's and self-care;Proper use of Adaptive equipment and DME       AM-PAC PT "6 Clicks" Mobility  Outcome Measure Help needed turning from your back to your side while in a flat bed without using bedrails?: A Lot Help needed moving from lying on your back to sitting on the side of a flat bed without using bedrails?: A Lot Help needed moving to and from a bed to a chair (including a wheelchair)?: A Lot Help needed standing up from a chair using your arms (e.g., wheelchair or bedside chair)?: A Lot Help needed to walk in hospital room?: A Lot Help needed climbing 3-5 steps with a railing? : A Lot 6 Click Score: 12    End of Session Equipment Utilized During Treatment: Gait belt Activity Tolerance: Patient tolerated treatment well;Patient limited by fatigue Patient left: in chair;with call bell/phone within reach;with chair alarm set;with nursing/sitter in room Nurse Communication: Mobility status PT Visit Diagnosis: Unsteadiness on feet (R26.81);Other abnormalities of gait and mobility (R26.89);Muscle weakness (generalized) (M62.81)    Time: 8937-3428 PT Time Calculation (min) (ACUTE ONLY): 27 min   Charges:   PT Evaluation $PT Eval Moderate Complexity: 1 Mod PT Treatments $Gait Training: 8-22 mins        12:15 PM, 05/31/18 Talbot Grumbling, PT, DPT Physical Therapist with [facility] [phone] mobile phone

## 2018-05-31 NOTE — Care Management (Signed)
CM notified daughter Matthew Wilkins that [facility] has no male beds to offer. She voiced concern that her father is increasingly weak. She asks that we send pt out to all facilities in [facility], [place].. CM discussed with CSW.

## 2018-05-31 NOTE — Progress Notes (Signed)
Pt vomiting up large amounts of brown/green bile. Denies any nausea, but reports some abdominal pain and says it is tender to the touch. Pt has good bowel sounds in all 4 quadrants. States he has been having bowel movements. MD made aware.

## 2018-05-31 NOTE — Care Management Note (Signed)
Case Management Note  Patient Details  Name: Matthew Wilkins MRN: [id number] Date of Birth: 06/16/1945  Subjective/Objective:    Admitted with UTI. Pt from home, lives alone. Has strong support from daughter. Pt with frequent admission over past year. Pt depressed after death of his wife. He has refused HH, OP PT and SNF in the past. Pt again refused placement and/or HH PT.                 Action/Plan: CM contacted pt daughter to discuss DC plan. Daughter believes she can convince pt to agree to SNF if bed available at [facility]. Any other facility she would not be able to convince him to go (his wife was in other facilities). Discussed with CSW who will send referral to [facility].   Expected Discharge Date:  05/30/18               Expected Discharge Plan:  Home/Self Care  In-House Referral:  NA  Discharge planning Services  CM Consult  Post Acute Care Choice:  NA Choice offered to:  NA  Status of Service:  Completed, signed off  If discussed at Long Length of Stay Meetings, dates discussed:    Additional Comments:  Sherald Barge, RN 05/31/2018, 11:32 AM

## 2018-05-31 NOTE — Evaluation (Signed)
Occupational Therapy Evaluation Patient Details Name: Matthew Wilkins MRN: [id number] DOB: 1945/05/20 Today's Date: 05/31/2018    History of Present Illness [name]  is a 73 y.o. 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  Today comes back to the hospital with generalized body aches.   Clinical Impression   Pt supine in bed, bedding wet due to pt spilling coffee and breakfast drinks. Pt agreeable to PT/OT co-evaluation this am. Pt requiring increased assistance with ADLs and mobility due to generalized weakness, balance deficits, and impulsivity. Recommend SNF on discharge to improve safety and independence in ADL completion, as well as improving strength and balance required for functional tasks.     Follow Up Recommendations  SNF    Equipment Recommendations  None recommended by OT       Precautions / Restrictions Precautions Precautions: Fall Restrictions Weight Bearing Restrictions: No      Mobility Bed Mobility Overal bed mobility: Needs Assistance Bed Mobility: Supine to Sit     Supine to sit: Min guard;HOB elevated        Transfers                 General transfer comment: Defer to PT note        ADL either performed or assessed with clinical judgement   ADL Overall ADL's : Needs assistance/impaired                 Upper Body Dressing : Minimal assistance;Sitting Upper Body Dressing Details (indicate cue type and reason): Pt doffed gown, min assist for donning to manage snaps with IV line Lower Body Dressing: Maximal assistance;Sitting/lateral leans Lower Body Dressing Details (indicate cue type and reason): staff doffing/donning socks after pt urinated in floor               General ADL Comments: During session pt purposefully  urinated in floor while PT/OT attempting to help pt get to bathroom. When giving instructions pt told staff "I'll just go here then" and proceeded to urinate in floor.      Vision Baseline Vision/History: Wears glasses Wears Glasses: Reading only Patient Visual Report: No change from baseline Vision Assessment?: No apparent visual deficits            Pertinent Vitals/Pain Pain Assessment: 0-10 Pain Score: 6  Pain Location: sides (abdomen) Pain Descriptors / Indicators: Discomfort Pain Intervention(s): Limited activity within patient's tolerance;Monitored during session;Repositioned     Hand Dominance Right   Extremity/Trunk Assessment Upper Extremity Assessment Upper Extremity Assessment: Generalized weakness(strength in BUE grossly 3-/5 to 3/5)   Lower Extremity Assessment Lower Extremity Assessment: Defer to PT evaluation   Cervical / Trunk Assessment Cervical / Trunk Assessment: Normal   Communication Communication Communication: Expressive difficulties(somewhat garbled speech)   Cognition Arousal/Alertness: Awake/alert Behavior During Therapy: Impulsive Overall Cognitive Status: No family/caregiver present to determine baseline cognitive functioning                                                Home Living Family/patient expects to be discharged to:: Private residence Living Arrangements: Alone Available Help at Discharge: Family;Available PRN/intermittently Type of Home: House Home Access: Level entry     Home Layout:  One level     Bathroom Shower/Tub: Teacher, early years/pre: Standard     Home Equipment: Cane - single point;Walker - 4 wheels;Wheelchair - [employer];Hospital bed;Shower seat;Cane - quad;Wheelchair - power          Prior Functioning/Environment Level of Independence: Independent with assistive device(s)        Comments: Per pt report: household ambulator with RW, cane PRN        OT  Problem List: Decreased strength;Decreased activity tolerance;Impaired balance (sitting and/or standing);Decreased cognition;Decreased safety awareness;Decreased knowledge of use of DME or AE;Impaired UE functional use      OT Treatment/Interventions: Self-care/ADL training;Therapeutic exercise;DME and/or AE instruction;Therapeutic activities;Patient/family education    OT Goals(Current goals can be found in the care plan section) Acute Rehab OT Goals Patient Stated Goal: none stated OT Goal Formulation: With patient Time For Goal Achievement: 06/14/18 Potential to Achieve Goals: Good  OT Frequency: Min 2X/week   Barriers to D/C: Decreased caregiver support          Co-evaluation PT/OT/SLP Co-Evaluation/Treatment: Yes Reason for Co-Treatment: Complexity of the patient's impairments (multi-system involvement);To address functional/ADL transfers   OT goals addressed during session: ADL's and self-care;Proper use of Adaptive equipment and DME      AM-PAC OT "6 Clicks" Daily Activity     Outcome Measure Help from another person eating meals?: None Help from another person taking care of personal grooming?: A Little Help from another person toileting, which includes using toliet, bedpan, or urinal?: A Lot Help from another person bathing (including washing, rinsing, drying)?: A Lot Help from another person to put on and taking off regular upper body clothing?: None Help from another person to put on and taking off regular lower body clothing?: A Lot 6 Click Score: 17   End of Session Equipment Utilized During Treatment: Gait belt;Rolling walker Nurse Communication: Other (comment)(RN in room and observed pt status)  Activity Tolerance: Patient tolerated treatment well Patient left: in chair;with call bell/phone within reach;with chair alarm set  OT Visit Diagnosis: Muscle weakness (generalized) (M62.81)                Time: 0388-8280 OT Time Calculation (min): 31 min Charges:   OT General Charges $OT Visit: 1 Visit OT Evaluation $OT Eval Low Complexity: 1 Low   Guadelupe Sabin, OTR/L  [phone] 05/31/2018, 9:54 AM

## 2018-05-31 NOTE — NC FL2 (Signed)
[place] LEVEL OF CARE SCREENING TOOL     IDENTIFICATION  Patient Name: Matthew Wilkins Birthdate: 1946-01-05 Sex: male Admission Date (Current Location): 05/28/2018  [facility] and [place] Number:  [employer] and Address:  [facility] [address] [place]      Provider Number: [id number]  Attending Physician Name and Address:  Kayleen Memos, DO  Relative Name and Phone Number:       Current Level of Care: Hospital Recommended Level of Care: [place] Prior Approval Number:    Date Approved/Denied:   PASRR Number:    Discharge Plan: SNF    Current Diagnoses: Patient Active Problem List   Diagnosis Date Noted  . Anorexia 04/25/2018  . Generalized weakness 04/25/2018  . Chronic anemia 04/25/2018  . CKD (chronic kidney disease) stage 3, GFR 30-59 ml/min (HCC) 04/25/2018  . Microscopic hematuria 04/25/2018  . HLD (hyperlipidemia) 04/25/2018  . COPD exacerbation ([place]) 04/24/2018  . Nephrolithiasis 04/03/2018  . RLL pneumonia ([place]) 04/03/2018  . CAP (community acquired pneumonia) 04/01/2018  . Reflux esophagitis 02/24/2018  . Esophageal ulcer without bleeding 02/24/2018  . Acute renal injury ([place]) 02/23/2018  . Diverticulitis of intestine 02/23/2018  . GERD (gastroesophageal reflux disease) 02/23/2018  . AKI (acute kidney injury) ([place]) 11/20/2017  . UTI (urinary tract infection) 11/20/2017  . Aphasia   . RUQ abdominal pain   . Hypothyroidism 07/27/2017  . HCAP (healthcare-associated pneumonia) 07/17/2017  . Diabetes mellitus due to underlying condition, uncontrolled ([place]) 07/17/2017  . Acute respiratory failure ([place]) 07/17/2017  . Encounter for antineoplastic immunotherapy 02/23/2017  . Adenocarcinoma of right lung, stage 3 ([place]) 11/17/2016  . Encounter for antineoplastic chemotherapy 11/17/2016  . Goals of care, counseling/discussion 11/17/2016  . Encounter for smoking cessation counseling  11/17/2016  . Left leg DVT ([place]) 08/31/2016  . Malnutrition of moderate degree 08/30/2016  . Thrombocytopenia ([place]) 08/29/2016  . History of pulmonary embolus (PE) 08/28/2016  . Pulmonary nodule 08/28/2016  . Hydronephrosis of right kidney 08/28/2016  . Chest pain 08/28/2016  . Abdominal pain 08/28/2016  . CAD (coronary artery disease)   . TIA (transient ischemic attack) 11/27/2011  . Research study patient 08/30/2011  . PAD (peripheral artery disease) ([place]) 06/30/2011  . PVD (peripheral vascular disease) ([place]) 01/10/2011  . COPD (chronic obstructive pulmonary disease) ([place]) 07/06/2010  . Tobacco use 11/12/2008  . Coronary atherosclerosis 09/08/2008  . DM type 2 (diabetes mellitus, type 2) ([place]) 08/26/2008  . HYPERCHOLESTEROLEMIA  IIA 08/26/2008  . ANXIETY 08/26/2008  . PARKINSON'S DISEASE 08/26/2008  . HTN (hypertension) 08/26/2008  . ARTHRITIS 08/26/2008  . SHORTNESS OF BREATH 08/26/2008    Orientation RESPIRATION BLADDER Height & Weight     Self, Time, Situation, Place  Normal Continent Weight: 68 kg Height:  5\' 9"  (175.3 cm)  BEHAVIORAL SYMPTOMS/MOOD NEUROLOGICAL BOWEL NUTRITION STATUS  (none) (none) Continent Diet(Carb modified; Liquid Consistency: Thin)  AMBULATORY STATUS COMMUNICATION OF NEEDS Skin   Extensive Assist Verbally Normal                       Personal Care Assistance Level of Assistance  Bathing, Feeding, Dressing Bathing Assistance: Limited assistance Feeding assistance: Independent Dressing Assistance: Limited assistance     Functional Limitations Info  Sight, Hearing, Speech Sight Info: Adequate Hearing Info: Adequate Speech Info: Adequate    SPECIAL CARE FACTORS FREQUENCY  PT (By licensed PT)     PT Frequency: 5X/W  Contractures Contractures Info: Not present    Additional Factors Info  Code Status, Allergies Code Status Info: Full Allergies Info: Aspirin           Current Medications (05/31/2018):  This is  the current hospital active medication list Current Facility-Administered Medications  Medication Dose Route Frequency Provider Last Rate Last Dose  . acetaminophen (TYLENOL) tablet 650 mg  650 mg Oral Q6H PRN Hall, Carole N, DO      . albuterol (PROVENTIL) (2.5 MG/3ML) 0.083% nebulizer solution 2.5 mg  2.5 mg Nebulization Q6H PRN Dana Allan I, MD      . alum & mag hydroxide-simeth (MAALOX/MYLANTA) 200-200-20 MG/5ML suspension 30 mL  30 mL Oral Q6H PRN Domenic Polite, MD   30 mL at 05/30/18 1932  . apixaban (ELIQUIS) tablet 5 mg  5 mg Oral BID Oswald Hillock, MD   5 mg at 05/31/18 1002  . dicyclomine (BENTYL) capsule 20 mg  20 mg Oral TID AC Oswald Hillock, MD   20 mg at 05/31/18 1228  . ezetimibe (ZETIA) tablet 10 mg  10 mg Oral Daily Oswald Hillock, MD   10 mg at 05/31/18 1001  . gabapentin (NEURONTIN) capsule 400 mg  400 mg Oral TID Oswald Hillock, MD   400 mg at 05/31/18 1521  . insulin aspart (novoLOG) injection 0-9 Units  0-9 Units Subcutaneous TID WC Darrick Meigs, Marge Duncans, MD      . lisinopril (PRINIVIL,ZESTRIL) tablet 5 mg  5 mg Oral Daily Oswald Hillock, MD   5 mg at 05/31/18 1001  . mometasone-formoterol (DULERA) 200-5 MCG/ACT inhaler 2 puff  2 puff Inhalation BID Oswald Hillock, MD   2 puff at 05/31/18 0805  . ondansetron (ZOFRAN) tablet 4 mg  4 mg Oral Q6H PRN Oswald Hillock, MD       Or  . ondansetron [facility]) injection 4 mg  4 mg Intravenous Q6H PRN Oswald Hillock, MD   4 mg at 05/30/18 1932  . pantoprazole (PROTONIX) EC tablet 40 mg  40 mg Oral Daily Oswald Hillock, MD   40 mg at 05/31/18 1001  . rOPINIRole (REQUIP) tablet 3 mg  3 mg Oral QID Oswald Hillock, MD   3 mg at 05/31/18 1520  . rosuvastatin (CRESTOR) tablet 20 mg  20 mg Oral Daily Oswald Hillock, MD   20 mg at 05/31/18 1001  . sertraline (ZOLOFT) tablet 50 mg  50 mg Oral Daily Oswald Hillock, MD   50 mg at 05/31/18 1001  . sucralfate (CARAFATE) 1 GM/10ML suspension 1 g  1 g Oral TID WC & HS Oswald Hillock, MD   1 g at 05/31/18 1227      Discharge Medications: Please see discharge summary for a list of discharge medications.  Relevant Imaging Results:  Relevant Lab Results:   Additional [place], LCSW

## 2018-06-01 DIAGNOSIS — N390 Urinary tract infection, site not specified: Secondary | ICD-10-CM | POA: Diagnosis not present

## 2018-06-01 LAB — GLUCOSE, CAPILLARY
GLUCOSE-CAPILLARY: 69 mg/dL — AB (ref 70–99)
Glucose-Capillary: 96 mg/dL (ref 70–99)
Glucose-Capillary: 64 mg/dL — ABNORMAL LOW (ref 70–99)
Glucose-Capillary: 76 mg/dL (ref 70–99)
Glucose-Capillary: 93 mg/dL (ref 70–99)
Glucose-Capillary: 124 mg/dL — ABNORMAL HIGH (ref 70–99)
Glucose-Capillary: 141 mg/dL — ABNORMAL HIGH (ref 70–99)

## 2018-06-01 MED ORDER — QUETIAPINE FUMARATE 25 MG PO TABS
25.0000 mg | ORAL_TABLET | Freq: Every day | ORAL | Status: DC
  Administered 2018-06-01: 25 mg via ORAL
  Filled 2018-06-01: qty 1

## 2018-06-01 MED ORDER — BISACODYL 10 MG RE SUPP
10.0000 mg | Freq: Once | RECTAL | Status: DC
  Filled 2018-06-01: qty 1

## 2018-06-01 MED ORDER — LACTATED RINGERS IV SOLN
INTRAVENOUS | Status: DC
  Administered 2018-06-01 – 2018-06-02 (×2): via INTRAVENOUS

## 2018-06-01 NOTE — Progress Notes (Signed)
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 -Patient is a very poor historian,Reports originally coming to the hospital yesterday with body aches leg pain,now tells me that his left leg has been weaker and have heavier for the last several weeks. -Lives independently, uses a walker and cane -Reason ended up getting a CT scan of his abdomen pelvis in the emergency room on admission yesterday which noted a 2.3 cm stone in the proximal ureter and some enhancement of the right renal pelvis,case was discussed with urologyDr.Winter,who recommended antibiotics and outpatient follow-up for definitive stone management. He has had his antibiotics discontinued on 1/16 and states that his pain is improved.  He continues to have periods of confusion and some hallucinations according to daughter at bedside and has poor appetite with some episodes of nausea and vomiting noted.  Assessment & Plan:   Active Problems:   UTI (urinary tract infection)  Right renal pelvis/proximal ureter stone withquestionable pyelonephritis -Symptoms do not really point to this however patient is a terrible historian -Patient is completed 3 days of IV Rocephin with no signs of UTI on UA and therefore antibiotics were canceled on 1/16. -Urology follow-up as an outpatient. -No symptoms of sepsis -Maintain on IV fluid due to poor appetite -Bisacodyl suppository today for BM  Acute encephalopathy versus delirium -Patient is noted to have some mild atrophy on brain MRI but no acute findings -It appears that he is likely in delirium or has some medication related toxicity -We will plan to discontinue  gabapentin and Requip for now and start Seroquel at bedtime and monitor -Worsening debility also noted with PT/OT recommendations for SNF on discharge  Left leg weakness -history is limited and reports worsening weakness more on the left side over the last 2 weeks -Patient is on Eliquis hence embolic stroke would be less likely however his compliance is uncertain -MRI brain; no acute abnormalities -Other possibility is worsening peripheral neuropathy from chemotherapy for lung cancer -PT OT eval  History of DVT/PE -Continue Eliquis  History of stage III adeno CA lung -Followed by Dr. Earlie Server -Had been treated with chemoradiation followed by consolidation immunotherapy however significant difficulties with goingback and forthto the clinic and getting treatment, currently immunotherapy is on hold  Diabetes mellitus -On metformin and glipizide at home, continue sliding scale insulin for now   DVT prophylaxis:Apixaban Code Status:Full code Family Communication:No family at bedside Disposition Plan:Continue IV fluid as well as diet advancement.  Monitor for changes to encephalopathy.  Anticipate discharge to SNF when ready.  Consultants:  D/w Urology Dr.Winter on admission  Procedures:   None  Antimicrobials:   Rocephin 1/13-1/16   Subjective: Patient seen and evaluated today with some symptomatic improvement in his pain levels, but continues to have poor appetite as well as some nausea and vomiting.  He has not had a bowel movement in the last 1 to 2 days.  Daughter at bedside is quite concerned about some hallucinations and periods of confusion.  Objective: Vitals:   05/31/18 2119 06/01/18 0540 06/01/18 0831 06/01/18 1337  BP: 114/86 115/68  125/84  Pulse: 84 81  75  Resp: 16 18  18   Temp: 99 F (37.2 C) 98 F (36.7  C)    TempSrc:      SpO2: 98% 98% 91% 93%  Weight:      Height:        Intake/Output Summary (Last 24 hours) at 06/01/2018 1509 Last  data filed at 06/01/2018 0000 Gross per 24 hour  Intake -  Output 250 ml  Net -250 ml   Filed Weights   05/28/18 1748  Weight: 68 kg    Examination:  General exam: Somnolent and confused Respiratory system: Clear to auscultation. Respiratory effort normal. Cardiovascular system: S1 & S2 heard, RRR. No JVD, murmurs, rubs, gallops or clicks. No pedal edema. Gastrointestinal system: Abdomen is nondistended, soft and nontender. No organomegaly or masses felt. Normal bowel sounds heard. Central nervous system: Somnolent Extremities: Symmetric 5 x 5 power. Skin: No rashes, lesions or ulcers Psychiatry: Cannot be evaluated    Data Reviewed: I have personally reviewed following labs and imaging studies  CBC: Recent Labs  Lab 05/28/18 1919 05/29/18 0459 05/31/18 0520  WBC 3.2* 2.9* 3.2*  NEUTROABS 1.7  --   --   HGB 11.9* 11.1* 11.3*  HCT 38.0* 33.9* 35.0*  MCV 88.4 87.1 87.7  PLT 181 149* 395*   Basic Metabolic Panel: Recent Labs  Lab 05/28/18 1919 05/29/18 0459 05/31/18 0520  NA 134* 134* 135  K 3.5 3.5 3.6  CL 103 104 106  CO2 23 22 22   GLUCOSE 147* 162* 81  BUN 11 11 8   CREATININE 1.54* 1.41* 0.96  CALCIUM 8.4* 8.4* 8.3*   GFR: Estimated Creatinine Clearance: 64.9 mL/min (by C-G formula based on SCr of 0.96 mg/dL). Liver Function Tests: Recent Labs  Lab 05/28/18 1919 05/29/18 0459  AST 31 27  ALT 11 11  ALKPHOS 48 45  BILITOT 0.8 0.6  PROT 7.3 6.5  ALBUMIN 4.1 3.6   No results for input(s): LIPASE, AMYLASE in the last 168 hours. No results for input(s): AMMONIA in the last 168 hours. Coagulation Profile: No results for input(s): INR, PROTIME in the last 168 hours. Cardiac Enzymes: Recent Labs  Lab 05/28/18 1953  CKTOTAL 345   BNP (last 3 results) No results for input(s): PROBNP in the last 8760 hours. HbA1C: No results for input(s): HGBA1C in the last 72 hours. CBG: Recent Labs  Lab 05/31/18 2116 06/01/18 0758 06/01/18 0816  06/01/18 0845 06/01/18 1101  GLUCAP 96 64* 69* 76 93   Lipid Profile: No results for input(s): CHOL, HDL, LDLCALC, TRIG, CHOLHDL, LDLDIRECT in the last 72 hours. Thyroid Function Tests: No results for input(s): TSH, T4TOTAL, FREET4, T3FREE, THYROIDAB in the last 72 hours. Anemia Panel: No results for input(s): VITAMINB12, FOLATE, FERRITIN, TIBC, IRON, RETICCTPCT in the last 72 hours. Sepsis Labs: Recent Labs  Lab 05/28/18 2233  LATICACIDVEN 0.51    Recent Results (from the past 240 hour(s))  Culture, blood (routine x 2)     Status: None (Preliminary result)   Collection Time: 05/28/18  7:53 PM  Result Value Ref Range Status   Specimen Description BLOOD RIGHT ARM  Final   Special Requests   Final    BOTTLES DRAWN AEROBIC AND ANAEROBIC Blood Culture adequate volume   Culture   Final    NO GROWTH 4 DAYS Performed at [facility], [address] [place], [place] [address]    Report Status PENDING  Incomplete  Culture, blood (routine x 2)     Status: None (Preliminary result)   Collection Time: 05/28/18  7:59 PM  Result Value Ref Range Status  Specimen Description BLOOD LEFT ARM  Final   Special Requests   Final    BOTTLES DRAWN AEROBIC AND ANAEROBIC Blood Culture adequate volume   Culture   Final    NO GROWTH 4 DAYS Performed at [facility], [address] [place], [place] [address]    Report Status PENDING  Incomplete  Urine culture     Status: None   Collection Time: 05/28/18 10:28 PM  Result Value Ref Range Status   Specimen Description   Final    URINE, CLEAN CATCH Performed at [facility], [address], [place], [place] [address]    Special Requests   Final    Normal Performed at [facility], [address] [place], [place] [address]    Culture   Final    NO GROWTH Performed at [facility], [place] [address], [place], [place] [address]    Report Status 05/30/2018 FINAL  Final         Radiology Studies: Dg Abd 1 View  Result Date:  05/31/2018 CLINICAL DATA:  Vomiting EXAM: ABDOMEN - 1 VIEW COMPARISON:  April 24, 2018 FINDINGS: The bowel gas pattern is normal. Right kidney stone is identified unchanged compared prior exam. Extensive bowel content is identified throughout colon. Degenerative joint changes of the spine and bilateral hips are noted. IMPRESSION: No bowel obstruction. Constipation. Electronically Signed   By: Abelardo Diesel M.D.   On: 05/31/2018 16:06        Scheduled Meds: . apixaban  5 mg Oral BID  . bisacodyl  10 mg Rectal Once  . dicyclomine  20 mg Oral TID AC  . ezetimibe  10 mg Oral Daily  . insulin aspart  0-9 Units Subcutaneous TID WC  . lisinopril  5 mg Oral Daily  . mometasone-formoterol  2 puff Inhalation BID  . pantoprazole  40 mg Oral Daily  . QUEtiapine  25 mg Oral QHS  . rosuvastatin  20 mg Oral Daily  . sertraline  50 mg Oral Daily  . sucralfate  1 g Oral TID WC & HS   Continuous Infusions: . lactated ringers 75 mL/hr at 06/01/18 1320     LOS: 0 days    Time spent: 30 minutes    Kasaundra Fahrney Darleen Crocker, DO [facility] Pager [phone]  If 7PM-7AM, please contact night-coverage [email or web address] Password [facility] 06/01/2018, 3:09 PM

## 2018-06-01 NOTE — Clinical Social Work Note (Signed)
Pt has hisotry of refusing SNF and [place] services.  Is refusing recommended SNF services again this time.  Nurse Case Manager requested I send out pt infor on HUB and see where he is accepted, and then let daughter know, as she is concerned about her father's ability to care for self at home, and she lives in [place] not convenient for checking on him.  Went over 3 facilities with daughter.  She states she will talk to other family members, as well as her father, and let me know what the family and patient have decided to do.

## 2018-06-01 NOTE — Progress Notes (Signed)
PT Cancellation Note  Patient Details Name: Matthew Wilkins MRN: [id number] DOB: 1945/08/21   Cancelled Treatment:    Reason Eval/Treat Not Completed: Patient declined, no reason specified. Patient refused treatment secondary to not feeling well. Will check back on patient this afternoon if time allows.  12:06 PM, 06/01/18 Talbot Grumbling, PT, DPT Physical Therapist with [facility] [phone] mobile phone

## 2018-06-02 DIAGNOSIS — N390 Urinary tract infection, site not specified: Secondary | ICD-10-CM | POA: Diagnosis not present

## 2018-06-02 LAB — BASIC METABOLIC PANEL
Anion gap: 8 (ref 5–15)
BUN: 12 mg/dL (ref 8–23)
CHLORIDE: 100 mmol/L (ref 98–111)
CO2: 22 mmol/L (ref 22–32)
Calcium: 8.9 mg/dL (ref 8.9–10.3)
Creatinine, Ser: 1.06 mg/dL (ref 0.61–1.24)
GFR calc Af Amer: >60 mL/min (ref >60)
GFR calc non Af Amer: >60 mL/min (ref >60)
Glucose, Bld: 106 mg/dL — ABNORMAL HIGH (ref 70–99)
POTASSIUM: 3.5 mmol/L (ref 3.5–5.1)
Sodium: 130 mmol/L — ABNORMAL LOW (ref 135–145)

## 2018-06-02 LAB — CBC
HCT: 31.7 % — ABNORMAL LOW (ref 39.0–52.0)
Hemoglobin: 10.6 g/dL — ABNORMAL LOW (ref 13.0–17.0)
MCH: 28.4 pg (ref 26.0–34.0)
MCHC: 33.4 g/dL (ref 30.0–36.0)
MCV: 85 fL (ref 80.0–100.0)
NRBC: 0 % (ref 0.0–0.2)
Platelets: 160 10*3/uL (ref 150–400)
RBC: 3.73 MIL/uL — AB (ref 4.22–5.81)
RDW: 14.6 % (ref 11.5–15.5)
WBC: 3.6 10*3/uL — ABNORMAL LOW (ref 4.0–10.5)

## 2018-06-02 LAB — CORTISOL: Cortisol, Plasma: 1.4 ug/dL

## 2018-06-02 LAB — GLUCOSE, CAPILLARY
Glucose-Capillary: 89 mg/dL (ref 70–99)
Glucose-Capillary: 109 mg/dL — ABNORMAL HIGH (ref 70–99)
Glucose-Capillary: 100 mg/dL — ABNORMAL HIGH (ref 70–99)
Glucose-Capillary: 133 mg/dL — ABNORMAL HIGH (ref 70–99)

## 2018-06-02 LAB — CULTURE, BLOOD (ROUTINE X 2)
Culture: NO GROWTH
Culture: NO GROWTH
SPECIAL REQUESTS: ADEQUATE
Special Requests: ADEQUATE

## 2018-06-02 LAB — T4, FREE: Free T4: <0.25 ng/dL — ABNORMAL LOW (ref 0.82–1.77)

## 2018-06-02 LAB — TSH: TSH: 109.89 u[IU]/mL — ABNORMAL HIGH (ref 0.350–4.500)

## 2018-06-02 LAB — AMMONIA: Ammonia: 12 umol/L (ref 9–35)

## 2018-06-02 MED ORDER — SORBITOL 70 % SOLN
960.0000 mL | TOPICAL_OIL | Freq: Once | ORAL | Status: DC
  Filled 2018-06-02: qty 473

## 2018-06-02 MED ORDER — SODIUM CHLORIDE 0.9 % IV SOLN
INTRAVENOUS | Status: DC
  Administered 2018-06-02 – 2018-06-06 (×7): via INTRAVENOUS

## 2018-06-02 NOTE — Progress Notes (Signed)
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 -Patient is a very poor historian,Reports originally coming to the hospital yesterday with body aches leg pain,now tells me that his left leg has been weaker and have heavier for the last several weeks. -Lives independently, uses a walker and cane -Reason ended up getting a CT scan of his abdomen pelvis in the emergency room on admission yesterday which noted a 2.3 cm stone in the proximal ureter and some enhancement of the right renal pelvis,case was discussed with urologyDr.Winter,who recommended antibiotics and outpatient follow-up for definitive stone management. He has had his antibiotics discontinued on 1/16 and states that his pain is improved.  He continues to have periods of confusion and some hallucinations according to daughter at bedside and has poor appetite with some episodes of nausea and vomiting noted.   Assessment & Plan:   Active Problems:   UTI (urinary tract infection)  Right renal pelvis/proximal ureter stone withquestionable pyelonephritis -Symptoms do not really point to this however patient is a terrible historian -Patient is completed 3 days of IV Rocephin with no signs of UTI on UA and therefore antibiotics were canceled on 1/16. -Urology follow-up as an outpatient. 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 -Patient is noted to have some mild atrophy on brain MRI but no acute findings -TSH is greatly elevated, awaiting results of Free T4 and cortisol to determine need of IV Synthroid,  T3 and Cortisol; may require transfer to SDU -We will plan to discontinue gabapentin and Requip for now and start Seroquel at bedtime and monitor -Worsening debility also noted with PT/OT recommendations for SNF on discharge  Left leg weakness -history is limited and reports worsening weakness more on the left side over the last 2 weeks -Patient is on Eliquis hence embolic stroke would be less likely however his compliance is uncertain -MRI brain; no acute abnormalities -Other possibility is worsening peripheral neuropathy from chemotherapy for lung cancer -PT OT eval with recommendation for SNF/Rehab  History of DVT/PE -Continue Eliquis  History of stage III adeno CA lung -Followed by Dr. Earlie Server -Had been treated with chemoradiation followed by consolidation immunotherapy however significant difficulties with goingback and forthto the clinic and getting treatment, currently immunotherapy is on hold  Diabetes mellitus -On metformin and glipizide at home, continue sliding scale insulin for now   DVT prophylaxis:Apixaban Code Status:Full code Family Communication:No family at bedside Disposition Plan:Continue IV fluid as well as diet advancement.  Monitor for changes to encephalopathy and evaluate for severe hypothyroid state.  Anticipate discharge to SNF when ready.  Consultants:  D/w Urology Dr.Winter on admission  Procedures:   None  Antimicrobials:   Rocephin 1/13-1/16   Subjective: Patient seen and evaluated today with no new acute complaints or concerns. No acute concerns or events noted overnight.  Patient apparently has continued to have periods of hallucination and difficulty with somnolence and fatigue according to daughter at bedside.  He refused suppository today and has not yet had a bowel movement.  He continues to have poor appetite and little intake.  Objective: Vitals:   06/01/18 1946 06/01/18  2215 06/02/18 0529 06/02/18 0857  BP:   111/76 (!) 94/51   Pulse: 80 73 77   Resp: 18 16 18    Temp:  97.7 F (36.5 C) 97.6 F (36.4 C)   TempSrc:  Oral Axillary   SpO2: 93% 99% 95% 91%  Weight:      Height:        Intake/Output Summary (Last 24 hours) at 06/02/2018 1454 Last data filed at 06/02/2018 1247 Gross per 24 hour  Intake 1126.49 ml  Output 1050 ml  Net 76.49 ml   Filed Weights   05/28/18 1748  Weight: 68 kg    Examination:  General exam: Appears calm and comfortable, somnolent Respiratory system: Clear to auscultation. Respiratory effort normal. Cardiovascular system: S1 & S2 heard, RRR. No JVD, murmurs, rubs, gallops or clicks. No pedal edema. Gastrointestinal system: Abdomen is nondistended, soft and nontender. No organomegaly or masses felt. Normal bowel sounds heard. Central nervous system: Alert. Extremities: Symmetric 5 x 5 power. Skin: No rashes, lesions or ulcers Psychiatry: Cannot be assessed.    Data Reviewed: I have personally reviewed following labs and imaging studies  CBC: Recent Labs  Lab 05/28/18 1919 05/29/18 0459 05/31/18 0520 06/02/18 0620  WBC 3.2* 2.9* 3.2* 3.6*  NEUTROABS 1.7  --   --   --   HGB 11.9* 11.1* 11.3* 10.6*  HCT 38.0* 33.9* 35.0* 31.7*  MCV 88.4 87.1 87.7 85.0  PLT 181 149* 148* 202   Basic Metabolic Panel: Recent Labs  Lab 05/28/18 1919 05/29/18 0459 05/31/18 0520 06/02/18 0620  NA 134* 134* 135 130*  K 3.5 3.5 3.6 3.5  CL 103 104 106 100  CO2 23 22 22 22   GLUCOSE 147* 162* 81 106*  BUN 11 11 8 12   CREATININE 1.54* 1.41* 0.96 1.06  CALCIUM 8.4* 8.4* 8.3* 8.9   GFR: Estimated Creatinine Clearance: 58.8 mL/min (by C-G formula based on SCr of 1.06 mg/dL). Liver Function Tests: Recent Labs  Lab 05/28/18 1919 05/29/18 0459  AST 31 27  ALT 11 11  ALKPHOS 48 45  BILITOT 0.8 0.6  PROT 7.3 6.5  ALBUMIN 4.1 3.6   No results for input(s): LIPASE, AMYLASE in the last 168 hours. Recent Labs  Lab 06/02/18 0756  AMMONIA 12   Coagulation  Profile: No results for input(s): INR, PROTIME in the last 168 hours. Cardiac Enzymes: Recent Labs  Lab 05/28/18 1953  CKTOTAL 345   BNP (last 3 results) No results for input(s): PROBNP in the last 8760 hours. HbA1C: No results for input(s): HGBA1C in the last 72 hours. CBG: Recent Labs  Lab 06/01/18 1101 06/01/18 1628 06/01/18 2323 06/02/18 0730 06/02/18 1108  GLUCAP 93 124* 141* 89 109*   Lipid Profile: No results for input(s): CHOL, HDL, LDLCALC, TRIG, CHOLHDL, LDLDIRECT in the last 72 hours. Thyroid Function Tests: Recent Labs    06/02/18 0756  TSH 109.890*   Anemia Panel: No results for input(s): VITAMINB12, FOLATE, FERRITIN, TIBC, IRON, RETICCTPCT in the last 72 hours. Sepsis Labs: Recent Labs  Lab 05/28/18 2233  LATICACIDVEN 0.51    Recent Results (from the past 240 hour(s))  Culture, blood (routine x 2)     Status: None   Collection Time: 05/28/18  7:53 PM  Result Value Ref Range Status   Specimen Description BLOOD RIGHT ARM  Final   Special Requests   Final    BOTTLES DRAWN AEROBIC AND ANAEROBIC Blood Culture adequate volume   Culture   Final  NO GROWTH 5 DAYS Performed at [facility], [address], [place], [place] [address]    Report Status 06/02/2018 FINAL  Final  Culture, blood (routine x 2)     Status: None   Collection Time: 05/28/18  7:59 PM  Result Value Ref Range Status   Specimen Description BLOOD LEFT ARM  Final   Special Requests   Final    BOTTLES DRAWN AEROBIC AND ANAEROBIC Blood Culture adequate volume   Culture   Final    NO GROWTH 5 DAYS Performed at [facility], [address], [place], [place] [address]    Report Status 06/02/2018 FINAL  Final  Urine culture     Status: None   Collection Time: 05/28/18 10:28 PM  Result Value Ref Range Status   Specimen Description   Final    URINE, CLEAN CATCH Performed at [facility], [address], [place], [place] [address]    Special Requests   Final    Normal Performed at  [facility], [address], [place], [place] [address]    Culture   Final    NO GROWTH Performed at [facility], [place] [address] [place], [place] [address]    Report Status 05/30/2018 FINAL  Final         Radiology Studies: Dg Abd 1 View  Result Date: 05/31/2018 CLINICAL DATA:  Vomiting EXAM: ABDOMEN - 1 VIEW COMPARISON:  April 24, 2018 FINDINGS: The bowel gas pattern is normal. Right kidney stone is identified unchanged compared prior exam. Extensive bowel content is identified throughout colon. Degenerative joint changes of the spine and bilateral hips are noted. IMPRESSION: No bowel obstruction. Constipation. Electronically Signed   By: Abelardo Diesel M.D.   On: 05/31/2018 16:06        Scheduled Meds: . apixaban  5 mg Oral BID  . bisacodyl  10 mg Rectal Once  . dicyclomine  20 mg Oral TID AC  . ezetimibe  10 mg Oral Daily  . insulin aspart  0-9 Units Subcutaneous TID WC  . mometasone-formoterol  2 puff Inhalation BID  . pantoprazole  40 mg Oral Daily  . rosuvastatin  20 mg Oral Daily  . sertraline  50 mg Oral Daily  . sorbitol, milk of mag, mineral oil, glycerin (SMOG) enema  960 mL Rectal Once  . sucralfate  1 g Oral TID WC & HS   Continuous Infusions: . sodium chloride       LOS: 0 days    Time spent: 30 minutes    Reganne Messerschmidt Darleen Crocker, DO [facility] Pager [phone]  If 7PM-7AM, please contact night-coverage [email or web address] Password [facility] 06/02/2018, 2:54 PM

## 2018-06-03 DIAGNOSIS — R680 Hypothermia, not associated with low environmental temperature: Secondary | ICD-10-CM | POA: Diagnosis present

## 2018-06-03 DIAGNOSIS — G9341 Metabolic encephalopathy: Secondary | ICD-10-CM | POA: Diagnosis present

## 2018-06-03 DIAGNOSIS — R4182 Altered mental status, unspecified: Secondary | ICD-10-CM | POA: Diagnosis present

## 2018-06-03 DIAGNOSIS — R531 Weakness: Secondary | ICD-10-CM | POA: Diagnosis present

## 2018-06-03 DIAGNOSIS — Z8673 Personal history of transient ischemic attack (TIA), and cerebral infarction without residual deficits: Secondary | ICD-10-CM | POA: Diagnosis not present

## 2018-06-03 DIAGNOSIS — I10 Essential (primary) hypertension: Secondary | ICD-10-CM | POA: Diagnosis present

## 2018-06-03 DIAGNOSIS — R630 Anorexia: Secondary | ICD-10-CM | POA: Diagnosis present

## 2018-06-03 DIAGNOSIS — N12 Tubulo-interstitial nephritis, not specified as acute or chronic: Secondary | ICD-10-CM | POA: Diagnosis present

## 2018-06-03 DIAGNOSIS — G620 Drug-induced polyneuropathy: Secondary | ICD-10-CM | POA: Diagnosis present

## 2018-06-03 DIAGNOSIS — F17200 Nicotine dependence, unspecified, uncomplicated: Secondary | ICD-10-CM | POA: Diagnosis present

## 2018-06-03 DIAGNOSIS — N202 Calculus of kidney with calculus of ureter: Secondary | ICD-10-CM | POA: Diagnosis present

## 2018-06-03 DIAGNOSIS — I2510 Atherosclerotic heart disease of native coronary artery without angina pectoris: Secondary | ICD-10-CM | POA: Diagnosis present

## 2018-06-03 DIAGNOSIS — E7800 Pure hypercholesterolemia, unspecified: Secondary | ICD-10-CM | POA: Diagnosis present

## 2018-06-03 DIAGNOSIS — R5381 Other malaise: Secondary | ICD-10-CM | POA: Diagnosis present

## 2018-06-03 DIAGNOSIS — E785 Hyperlipidemia, unspecified: Secondary | ICD-10-CM | POA: Diagnosis present

## 2018-06-03 DIAGNOSIS — R109 Unspecified abdominal pain: Secondary | ICD-10-CM | POA: Diagnosis present

## 2018-06-03 DIAGNOSIS — Z7901 Long term (current) use of anticoagulants: Secondary | ICD-10-CM | POA: Diagnosis not present

## 2018-06-03 DIAGNOSIS — Z86711 Personal history of pulmonary embolism: Secondary | ICD-10-CM | POA: Diagnosis not present

## 2018-06-03 DIAGNOSIS — E039 Hypothyroidism, unspecified: Secondary | ICD-10-CM | POA: Diagnosis present

## 2018-06-03 DIAGNOSIS — T451X5A Adverse effect of antineoplastic and immunosuppressive drugs, initial encounter: Secondary | ICD-10-CM | POA: Diagnosis present

## 2018-06-03 DIAGNOSIS — E1151 Type 2 diabetes mellitus with diabetic peripheral angiopathy without gangrene: Secondary | ICD-10-CM | POA: Diagnosis present

## 2018-06-03 DIAGNOSIS — R443 Hallucinations, unspecified: Secondary | ICD-10-CM | POA: Diagnosis present

## 2018-06-03 DIAGNOSIS — Z794 Long term (current) use of insulin: Secondary | ICD-10-CM | POA: Diagnosis not present

## 2018-06-03 DIAGNOSIS — Z923 Personal history of irradiation: Secondary | ICD-10-CM | POA: Diagnosis not present

## 2018-06-03 DIAGNOSIS — Z86718 Personal history of other venous thrombosis and embolism: Secondary | ICD-10-CM | POA: Diagnosis not present

## 2018-06-03 DIAGNOSIS — C3491 Malignant neoplasm of unspecified part of right bronchus or lung: Secondary | ICD-10-CM | POA: Diagnosis present

## 2018-06-03 DIAGNOSIS — N390 Urinary tract infection, site not specified: Secondary | ICD-10-CM | POA: Diagnosis not present

## 2018-06-03 LAB — CBC
HCT: 31.7 % — ABNORMAL LOW (ref 39.0–52.0)
Hemoglobin: 10.4 g/dL — ABNORMAL LOW (ref 13.0–17.0)
MCH: 28.3 pg (ref 26.0–34.0)
MCHC: 32.8 g/dL (ref 30.0–36.0)
MCV: 86.4 fL (ref 80.0–100.0)
Platelets: 163 10*3/uL (ref 150–400)
RBC: 3.67 MIL/uL — ABNORMAL LOW (ref 4.22–5.81)
RDW: 15 % (ref 11.5–15.5)
WBC: 4.2 10*3/uL (ref 4.0–10.5)
nRBC: 0 % (ref 0.0–0.2)

## 2018-06-03 LAB — BASIC METABOLIC PANEL
Anion gap: 6 (ref 5–15)
BUN: 10 mg/dL (ref 8–23)
CO2: 23 mmol/L (ref 22–32)
Calcium: 8.5 mg/dL — ABNORMAL LOW (ref 8.9–10.3)
Chloride: 107 mmol/L (ref 98–111)
Creatinine, Ser: 1.05 mg/dL (ref 0.61–1.24)
GFR calc Af Amer: >60 mL/min (ref >60)
GFR calc non Af Amer: >60 mL/min (ref >60)
Glucose, Bld: 110 mg/dL — ABNORMAL HIGH (ref 70–99)
POTASSIUM: 3.2 mmol/L — AB (ref 3.5–5.1)
Sodium: 136 mmol/L (ref 135–145)

## 2018-06-03 LAB — GLUCOSE, CAPILLARY
GLUCOSE-CAPILLARY: 183 mg/dL — AB (ref 70–99)
Glucose-Capillary: 152 mg/dL — ABNORMAL HIGH (ref 70–99)
Glucose-Capillary: 163 mg/dL — ABNORMAL HIGH (ref 70–99)
Glucose-Capillary: 126 mg/dL — ABNORMAL HIGH (ref 70–99)

## 2018-06-03 MED ORDER — LEVOTHYROXINE SODIUM 100 MCG/5ML IV SOLN
100.0000 ug | Freq: Every day | INTRAVENOUS | Status: DC
  Administered 2018-06-03: 100 ug via INTRAVENOUS
  Filled 2018-06-03 (×2): qty 5

## 2018-06-03 MED ORDER — HYDROCORTISONE NA SUCCINATE PF 100 MG IJ SOLR
100.0000 mg | Freq: Three times a day (TID) | INTRAMUSCULAR | Status: DC
  Administered 2018-06-03 (×3): 100 mg via INTRAVENOUS
  Filled 2018-06-03 (×4): qty 2

## 2018-06-03 MED ORDER — LEVOTHYROXINE SODIUM 100 MCG/5ML IV SOLN: 200.0000 ug | Freq: Once | INTRAVENOUS | Status: DC

## 2018-06-03 MED ORDER — QUETIAPINE FUMARATE 25 MG PO TABS
50.0000 mg | ORAL_TABLET | Freq: Every day | ORAL | Status: DC
  Administered 2018-06-03 – 2018-06-05 (×3): 50 mg via ORAL
  Filled 2018-06-03 (×3): qty 2

## 2018-06-03 MED ORDER — POTASSIUM CHLORIDE CRYS ER 20 MEQ PO TBCR
40.0000 meq | EXTENDED_RELEASE_TABLET | Freq: Once | ORAL | Status: AC
  Administered 2018-06-03: 40 meq via ORAL
  Filled 2018-06-03: qty 2

## 2018-06-03 NOTE — Progress Notes (Signed)
Patient removing clothes and blankets. Attempting to get out of bed. Patient stated that snakes are in his bed and he needs to get out of here. [facility] notified

## 2018-06-03 NOTE — Progress Notes (Signed)
Physical Therapy Treatment Patient Details Name: Matthew Wilkins MRN: [id number] DOB: [birth year] Today's Date: 06/03/2018    History of Present Illness Matthew Wilkins  is a 73 y.o. 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  Today comes back to the hospital with generalized body aches.    PT Comments    Patient agreeable for out of bed activities after encouragement.  Patient requires less assistance for sitting up at bedside, mild posterior lean upon standing, able to self correct and demonstrated increased endurance/distance for gait training without loss of balance, limited mostly due to c/o fatigue and tolerated sitting up in chair after therapy - RN notified.  Patient will benefit from continued physical therapy in hospital and recommended venue below to increase strength, balance, endurance for safe ADLs and gait.    Follow Up Recommendations  SNF;Supervision/Assistance - 24 hour;Supervision for mobility/OOB     Equipment Recommendations  None recommended by PT    Recommendations for Other Services       Precautions / Restrictions Precautions Precautions: Fall Restrictions Weight Bearing Restrictions: No    Mobility  Bed Mobility Overal bed mobility: Needs Assistance Bed Mobility: Supine to Sit     Supine to sit: Supervision     General bed mobility comments: increased time, head of bed raised approximately 45 degrees  Transfers Overall transfer level: Needs assistance Equipment used: Rolling walker (2 wheeled) Transfers: Sit to/from [facility] Sit to Stand: Min assist Stand pivot transfers: Min assist       General transfer comment: unsteady on feety, labored movement  Ambulation/Gait Ambulation/Gait assistance: Min assist;Min guard Gait  Distance (Feet): 50 Feet Assistive device: Rolling walker (2 wheeled) Gait Pattern/deviations: Decreased step length - right;Decreased step length - left;Decreased stride length Gait velocity: decreased   General Gait Details: slightly labored unsteady cadence without loss of balance, limited mostly due to c/o fatigue   Stairs             Wheelchair Mobility    Modified Rankin (Stroke Patients Only)       Balance Overall balance assessment: Needs assistance Sitting-balance support: Feet supported;No upper extremity supported Sitting balance-Leahy Scale: Good     Standing balance support: Bilateral upper extremity supported;During functional activity Standing balance-Leahy Scale: Fair Standing balance comment: using RW, mild posterior lean initially upon standing                            Cognition Arousal/Alertness: Awake/alert Behavior During Therapy: WFL for tasks assessed/performed;Impulsive Overall Cognitive Status: Within Functional Limits for tasks assessed                                        Exercises      General Comments        Pertinent Vitals/Pain Pain Assessment: No/denies pain    Home Living                      Prior Function            PT Goals (current goals can now be found in the care plan section) Acute Rehab PT Goals Patient Stated Goal: return home PT Goal Formulation: With patient Time  For Goal Achievement: 06/14/18 Potential to Achieve Goals: Good Progress towards PT goals: Progressing toward goals    Frequency    Min 3X/week      PT Plan Current plan remains appropriate    Co-evaluation              AM-PAC PT "6 Clicks" Mobility   Outcome Measure  Help needed turning from your back to your side while in a flat bed without using bedrails?: None Help needed moving from lying on your back to sitting on the side of a flat bed without using bedrails?: A Little(head of bed  slightly raised) Help needed moving to and from a bed to a chair (including a wheelchair)?: A Little Help needed standing up from a chair using your arms (e.g., wheelchair or bedside chair)?: A Little Help needed to walk in hospital room?: A Lot Help needed climbing 3-5 steps with a railing? : A Lot 6 Click Score: 17    End of Session   Activity Tolerance: Patient tolerated treatment well;Patient limited by fatigue Patient left: in chair;with call bell/phone within reach;with chair alarm set Nurse Communication: Mobility status PT Visit Diagnosis: Unsteadiness on feet (R26.81);Other abnormalities of gait and mobility (R26.89);Muscle weakness (generalized) (M62.81)     Time: 6553-7482 PT Time Calculation (min) (ACUTE ONLY): 23 min  Charges:  $Gait Training: 8-22 mins $Therapeutic Activity: 8-22 mins                     11:36 AM, 06/03/18 Lonell Grandchild, MPT Physical Therapist with [facility] 336 [phone] office [phone] mobile phone

## 2018-06-03 NOTE — Progress Notes (Signed)
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 -Patient is a very poor historian,Reports originally coming to the hospital yesterday with body aches leg pain,now tells me that his left leg has been weaker and have heavier for the last several weeks. -Lives independently, uses a walker and cane -Reason ended up getting a CT scan of his abdomen pelvis in the emergency room on admission yesterday which noted a 2.3 cm stone in the proximal ureter and some enhancement of the right renal pelvis,case was discussed with urologyDr.Winter,who recommended antibiotics and outpatient follow-up for definitive stone management. He has had his antibiotics discontinued on 1/16 and states that his pain is improved. He continues to have periods of confusion and some hallucinations according to daughter at bedside and has poor appetite with some episodes of nausea and vomiting noted.  He has been noted to have severe hypothyroidism noted on lab work for which IV Synthroid has been initiated along with some hydrocortisone.  Assessment & Plan:   Active Problems:   UTI (urinary tract infection)  Right renal pelvis/proximal ureter stone withquestionablepyelonephritis -Symptoms do not really point to this however patient is a terrible historian -Patient is completed 3 days of IV Rocephin with no signs of UTI on UA and therefore antibiotics were canceled on 1/16. -Urology follow-upas an outpatient. -No symptoms of sepsis -Maintain on IV fluid due to poor appetite -Bisacodyl suppository today for BM  Acute encephalopathy with severe hypothyroidism-improving -Patient is noted to have some  mild atrophy on brain MRI but no acute findings -TSH is greatly elevated along with a drop in T4 levels and low blood pressure.  Will supplement with IV Synthroid as well as hydrocortisone today and monitor for further changes. -We will plan to discontinue gabapentin and Requip for now and start Seroquel at bedtime and monitor -Worsening debility also noted with PT/OT recommendations for SNF on discharge  Left leg weakness -history is limited and reports worsening weakness more on the left side over the last 2 weeks -Patient is on Eliquis hence embolic stroke would be less likely however his compliance is uncertain -MRI brain; no acute abnormalities -Other possibility is worsening peripheral neuropathy from chemotherapy for lung cancer -PT OT eval with recommendation for SNF/Rehab  History of DVT/PE -Continue Eliquis  History of stage III adeno CA lung -Followed by Dr. Earlie Server -Had been treated with chemoradiation followed by consolidation immunotherapy however significant difficulties with goingback and forthto the clinic and getting treatment, currently immunotherapy is on hold  Diabetes mellitus -On metformin and glipizide at home, continue sliding scale insulin for now   DVT prophylaxis:Apixaban Code Status:Full code Family Communication:No family at bedside, spoke with daughter on 1/18 Disposition Plan:Continue IV fluid as well as diet advancement. Monitor for changes to encephalopathy and start IV Synthroid and hydrocortisone on 1/19. Anticipate discharge to SNF in next 24 to 48 hours on oral Synthroid if improved.  Consultants:  D/w Urology Dr.Winter on admission  Procedures:  None  Antimicrobials:  Rocephin 1/13-1/16  Subjective: Patient seen and evaluated today with no new acute complaints or concerns. No acute concerns or events noted overnight.  He states he has had a bowel movement and appears to be more alert and  oriented this  morning.  Objective: Vitals:   06/02/18 1500 06/02/18 2100 06/03/18 0509 06/03/18 0815  BP: 108/69 (!) 95/59 (!) 93/52   Pulse: 77 68 71   Resp: 18 18 18    Temp: 97.7 F (36.5 C) (!) 97.4 F (36.3 C) 97.9 F (36.6 C)   TempSrc: Axillary Oral Oral   SpO2: 97% 95% 94% 94%  Weight:      Height:        Intake/Output Summary (Last 24 hours) at 06/03/2018 1222 Last data filed at 06/03/2018 0520 Gross per 24 hour  Intake 1162.73 ml  Output 925 ml  Net 237.73 ml   Filed Weights   05/28/18 1748  Weight: 68 kg    Examination:  General exam: Appears calm and comfortable  Respiratory system: Clear to auscultation. Respiratory effort normal. Cardiovascular system: S1 & S2 heard, RRR. No JVD, murmurs, rubs, gallops or clicks. No pedal edema. Gastrointestinal system: Abdomen is nondistended, soft and nontender. No organomegaly or masses felt. Normal bowel sounds heard. Central nervous system: Alert and awake. Extremities: Symmetric 5 x 5 power. Skin: No rashes, lesions or ulcers Psychiatry: Difficult to evaluate.    Data Reviewed: I have personally reviewed following labs and imaging studies  CBC: Recent Labs  Lab 05/28/18 1919 05/29/18 0459 05/31/18 0520 06/02/18 0620 06/03/18 0640  WBC 3.2* 2.9* 3.2* 3.6* 4.2  NEUTROABS 1.7  --   --   --   --   HGB 11.9* 11.1* 11.3* 10.6* 10.4*  HCT 38.0* 33.9* 35.0* 31.7* 31.7*  MCV 88.4 87.1 87.7 85.0 86.4  PLT 181 149* 148* 160 633   Basic Metabolic Panel: Recent Labs  Lab 05/28/18 1919 05/29/18 0459 05/31/18 0520 06/02/18 0620 06/03/18 0640  NA 134* 134* 135 130* 136  K 3.5 3.5 3.6 3.5 3.2*  CL 103 104 106 100 107  CO2 23 22 22 22 23   GLUCOSE 147* 162* 81 106* 110*  BUN 11 11 8 12 10   CREATININE 1.54* 1.41* 0.96 1.06 1.05  CALCIUM 8.4* 8.4* 8.3* 8.9 8.5*   GFR: Estimated Creatinine Clearance: 59.4 mL/min (by C-G formula based on SCr of 1.05 mg/dL). Liver Function Tests: Recent Labs  Lab 05/28/18 1919  05/29/18 0459  AST 31 27  ALT 11 11  ALKPHOS 48 45  BILITOT 0.8 0.6  PROT 7.3 6.5  ALBUMIN 4.1 3.6   No results for input(s): LIPASE, AMYLASE in the last 168 hours. Recent Labs  Lab 06/02/18 0756  AMMONIA 12   Coagulation Profile: No results for input(s): INR, PROTIME in the last 168 hours. Cardiac Enzymes: Recent Labs  Lab 05/28/18 1953  CKTOTAL 345   BNP (last 3 results) No results for input(s): PROBNP in the last 8760 hours. HbA1C: No results for input(s): HGBA1C in the last 72 hours. CBG: Recent Labs  Lab 06/02/18 1108 06/02/18 1609 06/02/18 2241 06/03/18 0751 06/03/18 1146  GLUCAP 109* 100* 133* 152* 163*   Lipid Profile: No results for input(s): CHOL, HDL, LDLCALC, TRIG, CHOLHDL, LDLDIRECT in the last 72 hours. Thyroid Function Tests: Recent Labs    06/02/18 0756 06/02/18 1332  TSH 109.890*  --   FREET4  --  <0.25*   Anemia Panel: No results for input(s): VITAMINB12, FOLATE, FERRITIN, TIBC, IRON, RETICCTPCT in the last 72 hours. Sepsis Labs: Recent Labs  Lab 05/28/18 2233  LATICACIDVEN 0.51    Recent Results (from the past 240 hour(s))  Culture, blood (routine x 2)     Status: None  Collection Time: 05/28/18  7:53 PM  Result Value Ref Range Status   Specimen Description BLOOD RIGHT ARM  Final   Special Requests   Final    BOTTLES DRAWN AEROBIC AND ANAEROBIC Blood Culture adequate volume   Culture   Final    NO GROWTH 5 DAYS Performed at [facility], [address], [place], [place] [address]    Report Status 06/02/2018 FINAL  Final  Culture, blood (routine x 2)     Status: None   Collection Time: 05/28/18  7:59 PM  Result Value Ref Range Status   Specimen Description BLOOD LEFT ARM  Final   Special Requests   Final    BOTTLES DRAWN AEROBIC AND ANAEROBIC Blood Culture adequate volume   Culture   Final    NO GROWTH 5 DAYS Performed at [facility], [address] [place], [place] [address]    Report Status 06/02/2018 FINAL  Final   Urine culture     Status: None   Collection Time: 05/28/18 10:28 PM  Result Value Ref Range Status   Specimen Description   Final    URINE, CLEAN CATCH Performed at [facility], [address], [place], [place] [address]    Special Requests   Final    Normal Performed at [facility], [address], [place], [place] [address]    Culture   Final    NO GROWTH Performed at [facility], [place] [address], [place], [place] [address]    Report Status 05/30/2018 FINAL  Final         Radiology Studies: No results found.      Scheduled Meds: . apixaban  5 mg Oral BID  . bisacodyl  10 mg Rectal Once  . dicyclomine  20 mg Oral TID AC  . ezetimibe  10 mg Oral Daily  . hydrocortisone sod succinate (SOLU-CORTEF) inj  100 mg Intravenous Q8H  . insulin aspart  0-9 Units Subcutaneous TID WC  . levothyroxine  100 mcg Intravenous Daily  . mometasone-formoterol  2 puff Inhalation BID  . pantoprazole  40 mg Oral Daily  . potassium chloride  40 mEq Oral Once  . rosuvastatin  20 mg Oral Daily  . sertraline  50 mg Oral Daily  . sorbitol, milk of mag, mineral oil, glycerin (SMOG) enema  960 mL Rectal Once  . sucralfate  1 g Oral TID WC & HS   Continuous Infusions: . sodium chloride 75 mL/hr at 06/03/18 0402     LOS: 0 days    Time spent: 30 minutes    Seth Friedlander Darleen Crocker, DO [facility] Pager [phone]  If 7PM-7AM, please contact night-coverage [email or web address] Password [facility] 06/03/2018, 12:22 PM

## 2018-06-04 DIAGNOSIS — R4182 Altered mental status, unspecified: Secondary | ICD-10-CM

## 2018-06-04 LAB — MAGNESIUM: Magnesium: 1.9 mg/dL (ref 1.7–2.4)

## 2018-06-04 LAB — BASIC METABOLIC PANEL
Anion gap: 10 (ref 5–15)
BUN: 9 mg/dL (ref 8–23)
CO2: 19 mmol/L — ABNORMAL LOW (ref 22–32)
Calcium: 8.8 mg/dL — ABNORMAL LOW (ref 8.9–10.3)
Chloride: 106 mmol/L (ref 98–111)
Creatinine, Ser: 0.96 mg/dL (ref 0.61–1.24)
GFR calc Af Amer: >60 mL/min (ref >60)
GFR calc non Af Amer: >60 mL/min (ref >60)
Glucose, Bld: 170 mg/dL — ABNORMAL HIGH (ref 70–99)
Potassium: 3.1 mmol/L — ABNORMAL LOW (ref 3.5–5.1)
Sodium: 135 mmol/L (ref 135–145)

## 2018-06-04 LAB — CBC
HCT: 34.3 % — ABNORMAL LOW (ref 39.0–52.0)
HEMOGLOBIN: 11.6 g/dL — AB (ref 13.0–17.0)
MCH: 28.2 pg (ref 26.0–34.0)
MCHC: 33.8 g/dL (ref 30.0–36.0)
MCV: 83.5 fL (ref 80.0–100.0)
Platelets: 190 10*3/uL (ref 150–400)
RBC: 4.11 MIL/uL — ABNORMAL LOW (ref 4.22–5.81)
RDW: 15.1 % (ref 11.5–15.5)
WBC: 5.4 10*3/uL (ref 4.0–10.5)
nRBC: 0 % (ref 0.0–0.2)

## 2018-06-04 LAB — GLUCOSE, CAPILLARY
Glucose-Capillary: 149 mg/dL — ABNORMAL HIGH (ref 70–99)
Glucose-Capillary: 145 mg/dL — ABNORMAL HIGH (ref 70–99)
Glucose-Capillary: 116 mg/dL — ABNORMAL HIGH (ref 70–99)

## 2018-06-04 MED ORDER — LORAZEPAM 2 MG/ML IJ SOLN
1.0000 mg | Freq: Once | INTRAMUSCULAR | Status: AC | PRN
  Administered 2018-06-04: 1 mg via INTRAVENOUS
  Filled 2018-06-04: qty 1

## 2018-06-04 MED ORDER — LISINOPRIL 5 MG PO TABS
5.0000 mg | ORAL_TABLET | Freq: Every day | ORAL | Status: DC
  Administered 2018-06-05 – 2018-06-06 (×2): 5 mg via ORAL
  Filled 2018-06-04 (×2): qty 1

## 2018-06-04 MED ORDER — LEVOTHYROXINE SODIUM 25 MCG PO TABS
25.0000 ug | ORAL_TABLET | Freq: Every day | ORAL | Status: DC
  Administered 2018-06-05 – 2018-06-06 (×2): 25 ug via ORAL
  Filled 2018-06-04 (×2): qty 1

## 2018-06-04 MED ORDER — POTASSIUM CHLORIDE CRYS ER 20 MEQ PO TBCR
40.0000 meq | EXTENDED_RELEASE_TABLET | Freq: Two times a day (BID) | ORAL | Status: DC
  Administered 2018-06-04 – 2018-06-06 (×5): 40 meq via ORAL
  Filled 2018-06-04 (×4): qty 2

## 2018-06-04 NOTE — NC FL2 (Signed)
[place] LEVEL OF CARE SCREENING TOOL     IDENTIFICATION  Patient Name: Matthew Wilkins Birthdate: 1945-08-15 Sex: male Admission Date (Current Location): 05/28/2018  [facility] and [place] Number:  [employer] and Address:  [place] [address], [place]      Provider Number: [id number]  Attending Physician Name and Address:  Rodena Goldmann, DO  Relative Name and Phone Number:       Current Level of Care: Hospital Recommended Level of Care: [place] Prior Approval Number:    Date Approved/Denied:   PASRR Number: [id number]  Discharge Plan: SNF    Current Diagnoses: Patient Active Problem List   Diagnosis Date Noted  . Altered mental status 06/03/2018  . Anorexia 04/25/2018  . Generalized weakness 04/25/2018  . Chronic anemia 04/25/2018  . CKD (chronic kidney disease) stage 3, GFR 30-59 ml/min (HCC) 04/25/2018  . Microscopic hematuria 04/25/2018  . HLD (hyperlipidemia) 04/25/2018  . COPD exacerbation ([place]) 04/24/2018  . Nephrolithiasis 04/03/2018  . RLL pneumonia ([place]) 04/03/2018  . CAP (community acquired pneumonia) 04/01/2018  . Reflux esophagitis 02/24/2018  . Esophageal ulcer without bleeding 02/24/2018  . Acute renal injury ([place]) 02/23/2018  . Diverticulitis of intestine 02/23/2018  . GERD (gastroesophageal reflux disease) 02/23/2018  . AKI (acute kidney injury) ([place]) 11/20/2017  . UTI (urinary tract infection) 11/20/2017  . Aphasia   . RUQ abdominal pain   . Hypothyroidism 07/27/2017  . HCAP (healthcare-associated pneumonia) 07/17/2017  . Diabetes mellitus due to underlying condition, uncontrolled ([place]) 07/17/2017  . Acute respiratory failure ([place]) 07/17/2017  . Encounter for antineoplastic immunotherapy 02/23/2017  . Adenocarcinoma of right lung, stage 3 ([place]) 11/17/2016  . Encounter for antineoplastic chemotherapy 11/17/2016  . Goals of care, counseling/discussion 11/17/2016  .  Encounter for smoking cessation counseling 11/17/2016  . Left leg DVT ([place]) 08/31/2016  . Malnutrition of moderate degree 08/30/2016  . Thrombocytopenia ([place]) 08/29/2016  . History of pulmonary embolus (PE) 08/28/2016  . Pulmonary nodule 08/28/2016  . Hydronephrosis of right kidney 08/28/2016  . Chest pain 08/28/2016  . Abdominal pain 08/28/2016  . CAD (coronary artery disease)   . TIA (transient ischemic attack) 11/27/2011  . Research study patient 08/30/2011  . PAD (peripheral artery disease) ([place]) 06/30/2011  . PVD (peripheral vascular disease) ([place]) 01/10/2011  . COPD (chronic obstructive pulmonary disease) ([place]) 07/06/2010  . Tobacco use 11/12/2008  . Coronary atherosclerosis 09/08/2008  . DM type 2 (diabetes mellitus, type 2) ([place]) 08/26/2008  . HYPERCHOLESTEROLEMIA  IIA 08/26/2008  . ANXIETY 08/26/2008  . PARKINSON'S DISEASE 08/26/2008  . HTN (hypertension) 08/26/2008  . ARTHRITIS 08/26/2008  . SHORTNESS OF BREATH 08/26/2008    Orientation RESPIRATION BLADDER Height & Weight     Self, Place  Normal Continent Weight: 68 kg Height:  5\' 9"  (175.3 cm)  BEHAVIORAL SYMPTOMS/MOOD NEUROLOGICAL BOWEL NUTRITION STATUS  (none) (none) Continent Diet(Carb modified; Liquid Consistency: Thin)  AMBULATORY STATUS COMMUNICATION OF NEEDS Skin   Extensive Assist Verbally Normal                       Personal Care Assistance Level of Assistance  Bathing, Feeding, Dressing Bathing Assistance: Limited assistance Feeding assistance: Independent Dressing Assistance: Limited assistance     Functional Limitations Info  Sight, Hearing, Speech Sight Info: Adequate Hearing Info: Adequate Speech Info: Adequate    SPECIAL CARE FACTORS FREQUENCY  PT (By licensed PT)     PT Frequency: 5X/W  Contractures Contractures Info: Not present    Additional Factors Info  Code Status, Allergies Code Status Info: Full Allergies Info: Aspirin           Current  Medications (06/04/2018):  This is the current hospital active medication list Current Facility-Administered Medications  Medication Dose Route Frequency Provider Last Rate Last Dose  . 0.9 %  sodium chloride infusion   Intravenous Continuous Heath Lark D, DO 75 mL/hr at 06/03/18 1924    . acetaminophen (TYLENOL) tablet 650 mg  650 mg Oral Q6H PRN Hall, Carole N, DO      . albuterol (PROVENTIL) (2.5 MG/3ML) 0.083% nebulizer solution 2.5 mg  2.5 mg Nebulization Q6H PRN Dana Allan I, MD      . alum & mag hydroxide-simeth (MAALOX/MYLANTA) 200-200-20 MG/5ML suspension 30 mL  30 mL Oral Q6H PRN Domenic Polite, MD   30 mL at 05/30/18 1932  . apixaban (ELIQUIS) tablet 5 mg  5 mg Oral BID Oswald Hillock, MD   5 mg at 06/03/18 2138  . bisacodyl (DULCOLAX) suppository 10 mg  10 mg Rectal Once Manuella Ghazi, Pratik D, DO      . dicyclomine (BENTYL) capsule 20 mg  20 mg Oral TID AC Oswald Hillock, MD   20 mg at 06/03/18 1852  . ezetimibe (ZETIA) tablet 10 mg  10 mg Oral Daily Oswald Hillock, MD   Stopped at 06/03/18 0805  . insulin aspart (novoLOG) injection 0-9 Units  0-9 Units Subcutaneous TID WC Oswald Hillock, MD   1 Units at 06/03/18 1852  . levothyroxine (SYNTHROID, LEVOTHROID) tablet 25 mcg  25 mcg Oral Q0600 Manuella Ghazi, Pratik D, DO      . lisinopril (PRINIVIL,ZESTRIL) tablet 5 mg  5 mg Oral Daily Shah, Pratik D, DO      . mometasone-formoterol (DULERA) 200-5 MCG/ACT inhaler 2 puff  2 puff Inhalation BID Oswald Hillock, MD   2 puff at 06/04/18 0740  . ondansetron (ZOFRAN) tablet 4 mg  4 mg Oral Q6H PRN Oswald Hillock, MD       Or  . ondansetron [facility]) injection 4 mg  4 mg Intravenous Q6H PRN Oswald Hillock, MD   4 mg at 05/30/18 1932  . pantoprazole (PROTONIX) EC tablet 40 mg  40 mg Oral Daily Oswald Hillock, MD   Stopped at 06/03/18 [phone]  . potassium chloride SA (K-DUR,KLOR-CON) CR tablet 40 mEq  40 mEq Oral BID Manuella Ghazi, Pratik D, DO      . QUEtiapine (SEROQUEL) tablet 50 mg  50 mg Oral QHS Blount, Xenia T, NP    50 mg at 06/03/18 2203  . rosuvastatin (CRESTOR) tablet 20 mg  20 mg Oral Daily Oswald Hillock, MD   Stopped at 06/03/18 [phone]  . sertraline (ZOLOFT) tablet 50 mg  50 mg Oral Daily Oswald Hillock, MD   Stopped at 06/03/18 [phone]  . sorbitol, milk of mag, mineral oil, glycerin (SMOG) enema  960 mL Rectal Once Manuella Ghazi, Pratik D, DO      . sucralfate (CARAFATE) 1 GM/10ML suspension 1 g  1 g Oral TID WC & HS Oswald Hillock, MD   1 g at 06/03/18 2138     Discharge Medications: Please see discharge summary for a list of discharge medications.  Relevant Imaging Results:  Relevant Lab Results:   Additional [place], LCSW

## 2018-06-04 NOTE — Progress Notes (Addendum)
Patients CBG 176. Failed to transfer to Epic.

## 2018-06-04 NOTE — Progress Notes (Signed)
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 -Patient is a very poor historian,Reports originally coming to the hospital yesterday with body aches leg pain,now tells me that his left leg has been weaker and have heavier for the last several weeks. -Lives independently, uses a walker and cane -Reason ended up getting a CT scan of his abdomen pelvis in the emergency room on admission yesterday which noted a 2.3 cm stone in the proximal ureter and some enhancement of the right renal pelvis,case was discussed with urologyDr.Winter,who recommended antibiotics and outpatient follow-up for definitive stone management. He has had his antibiotics discontinued on 1/16 and states that his pain is improved. He continues to have periods of confusion and some hallucinations according to daughter at bedside and has poor appetite with some episodes of nausea and vomiting noted.  He has been noted to have severe hypothyroidism noted on lab work for which IV Synthroid had been initiated along with some hydrocortisone on 1/19.  Patient continues to have some hallucinations and agitation overnight which appears to be a sundowning phenomenon related to delirium and possible dementia.  Started back on Seroquel overnight.   Assessment & Plan:   Active Problems:   UTI (urinary tract infection)   Altered mental status  Right renal pelvis/proximal ureter stone withquestionablepyelonephritis -Symptoms do not really point to this however patient is a terrible historian -Patient is completed 3 days of IV Rocephin with no signs of UTI on UA and therefore antibiotics were canceled on  1/16. -Urology follow-upas an outpatient. -No symptoms of sepsis -Maintain on IV fluid due to poor appetite -Nursing staff to give report on oral intake as well as bowel movement  Acute encephalopathy-ongoing,likely secondary to delirium in the setting of severe hypothyroidism -Patient is noted to have some mild atrophy on brain MRI but no acute findings -Discontinue IV Synthroid as well as IV hydrocortisone and maintain on oral Synthroid at this point. -Continue on Seroquel and continue to hold gabapentin and Requip for now. -Worsening debility also noted with PT/OT recommendations for SNF on discharge if patient agrees to go.  Clinical social worker planning to discuss further with daughter today.  Could consider discharge once he is further stabilized on Seroquel.  Left leg weakness -history is limited and reports worsening weakness more on the left side over the last 2 weeks -Patient is on Eliquis hence embolic stroke would be less likely however his compliance is uncertain -MRI brain; no acute abnormalities -Other possibility is worsening peripheral neuropathy from chemotherapy for lung cancer -PT OT evalwith recommendation for SNF/Rehab  History of DVT/PE -Continue Eliquis  History of stage III adeno CA lung -Followed by Dr. Earlie Server -Had been treated with chemoradiation followed by consolidation immunotherapy however significant difficulties with goingback and forthto the clinic and getting treatment, currently immunotherapy is on hold  Diabetes mellitus -On metformin and glipizide at home, continue sliding scale insulin for now with adequate blood glucose control noted.  Hypertension -Home lisinopril restarted at this point and will hold hydrocortisone due to blood pressure elevation.   DVT prophylaxis:Apixaban Code Status:Full code Family Communication:No family at bedside, spoke with daughter on 1/18 Disposition Plan:Continue IV fluid for now and see how  oral  intake is.  Patient appears to have delirium-continue on Seroquel.  Continue to treat hypothyroidism with oral levothyroxine.  Consultants:  D/w Urology Dr.Winter on admission  Procedures:  None  Antimicrobials:  Rocephin 1/13-1/16  Subjective: Patient seen and evaluated today and is currently quite somnolent after a night full of agitation and hallucinations where he saw snakes in his bed.   Objective: Vitals:   06/03/18 2005 06/03/18 2147 06/04/18 0634 06/04/18 0742  BP:  121/71 (!) 130/106   Pulse: 87 78 89   Resp: 18     Temp:  97.8 F (36.6 C) 97.9 F (36.6 C)   TempSrc:  Oral Oral   SpO2: 94% 96% 94% 96%  Weight:      Height:        Intake/Output Summary (Last 24 hours) at 06/04/2018 1107 Last data filed at 06/03/2018 1700 Gross per 24 hour  Intake 600 ml  Output -  Net 600 ml   Filed Weights   05/28/18 1748  Weight: 68 kg    Examination:  General exam: Somnolent Respiratory system: Clear to auscultation. Respiratory effort normal. Cardiovascular system: S1 & S2 heard, RRR. No JVD, murmurs, rubs, gallops or clicks. No pedal edema. Gastrointestinal system: Abdomen is nondistended, soft and nontender. No organomegaly or masses felt. Normal bowel sounds heard. Central nervous system: Somnolent Extremities: No significant edema. Skin: No rashes, lesions or ulcers Psychiatry: Cannot be assessed.     Data Reviewed: I have personally reviewed following labs and imaging studies  CBC: Recent Labs  Lab 05/28/18 1919 05/29/18 0459 05/31/18 0520 06/02/18 0620 06/03/18 0640 06/04/18 0759  WBC 3.2* 2.9* 3.2* 3.6* 4.2 5.4  NEUTROABS 1.7  --   --   --   --   --   HGB 11.9* 11.1* 11.3* 10.6* 10.4* 11.6*  HCT 38.0* 33.9* 35.0* 31.7* 31.7* 34.3*  MCV 88.4 87.1 87.7 85.0 86.4 83.5  PLT 181 149* 148* 160 163 676   Basic Metabolic Panel: Recent Labs  Lab 05/29/18 0459 05/31/18 0520 06/02/18 0620 06/03/18 0640 06/04/18 0759  NA 134* 135 130*  136 135  K 3.5 3.6 3.5 3.2* 3.1*  CL 104 106 100 107 106  CO2 22 22 22 23  19*  GLUCOSE 162* 81 106* 110* 170*  BUN 11 8 12 10 9   CREATININE 1.41* 0.96 1.06 1.05 0.96  CALCIUM 8.4* 8.3* 8.9 8.5* 8.8*  MG  --   --   --   --  1.9   GFR: Estimated Creatinine Clearance: 64.9 mL/min (by C-G formula based on SCr of 0.96 mg/dL). Liver Function Tests: Recent Labs  Lab 05/28/18 1919 05/29/18 0459  AST 31 27  ALT 11 11  ALKPHOS 48 45  BILITOT 0.8 0.6  PROT 7.3 6.5  ALBUMIN 4.1 3.6   No results for input(s): LIPASE, AMYLASE in the last 168 hours. Recent Labs  Lab 06/02/18 0756  AMMONIA 12   Coagulation Profile: No results for input(s): INR, PROTIME in the last 168 hours. Cardiac Enzymes: Recent Labs  Lab 05/28/18 1953  CKTOTAL 345   BNP (last 3 results) No results for input(s): PROBNP in the last 8760 hours. HbA1C: No results for input(s): HGBA1C in the last 72 hours. CBG: Recent Labs  Lab 06/03/18 0751 06/03/18 1146 06/03/18 1705 06/03/18 2146 06/04/18 0826  GLUCAP 152* 163* 126* 183* 149*   Lipid Profile: No results for input(s): CHOL, HDL, LDLCALC, TRIG, CHOLHDL, LDLDIRECT in the last 72 hours. Thyroid Function Tests: Recent Labs  06/02/18 0756 06/02/18 1332  TSH 109.890*  --   FREET4  --  <0.25*   Anemia Panel: No results for input(s): VITAMINB12, FOLATE, FERRITIN, TIBC, IRON, RETICCTPCT in the last 72 hours. Sepsis Labs: Recent Labs  Lab 05/28/18 2233  LATICACIDVEN 0.51    Recent Results (from the past 240 hour(s))  Culture, blood (routine x 2)     Status: None   Collection Time: 05/28/18  7:53 PM  Result Value Ref Range Status   Specimen Description BLOOD RIGHT ARM  Final   Special Requests   Final    BOTTLES DRAWN AEROBIC AND ANAEROBIC Blood Culture adequate volume   Culture   Final    NO GROWTH 5 DAYS Performed at [facility], [address], [place], [place] [address]    Report Status 06/02/2018 FINAL  Final  Culture, blood (routine x  2)     Status: None   Collection Time: 05/28/18  7:59 PM  Result Value Ref Range Status   Specimen Description BLOOD LEFT ARM  Final   Special Requests   Final    BOTTLES DRAWN AEROBIC AND ANAEROBIC Blood Culture adequate volume   Culture   Final    NO GROWTH 5 DAYS Performed at [facility], [address] [place], [place] [address]    Report Status 06/02/2018 FINAL  Final  Urine culture     Status: None   Collection Time: 05/28/18 10:28 PM  Result Value Ref Range Status   Specimen Description   Final    URINE, CLEAN CATCH Performed at [facility], [address], [place], [place] [address]    Special Requests   Final    Normal Performed at [facility], [address], [place], [place] [address]    Culture   Final    NO GROWTH Performed at [facility], [place] [address] [place], [place] [address]    Report Status 05/30/2018 FINAL  Final         Radiology Studies: No results found.      Scheduled Meds: . apixaban  5 mg Oral BID  . bisacodyl  10 mg Rectal Once  . dicyclomine  20 mg Oral TID AC  . ezetimibe  10 mg Oral Daily  . insulin aspart  0-9 Units Subcutaneous TID WC  . levothyroxine  25 mcg Oral Q0600  . lisinopril  5 mg Oral Daily  . mometasone-formoterol  2 puff Inhalation BID  . pantoprazole  40 mg Oral Daily  . potassium chloride  40 mEq Oral BID  . QUEtiapine  50 mg Oral QHS  . rosuvastatin  20 mg Oral Daily  . sertraline  50 mg Oral Daily  . sorbitol, milk of mag, mineral oil, glycerin (SMOG) enema  960 mL Rectal Once  . sucralfate  1 g Oral TID WC & HS   Continuous Infusions: . sodium chloride 75 mL/hr at 06/03/18 1924     LOS: 1 day    Time spent: 30 minutes    Jaquanna Ballentine Darleen Crocker, DO [facility] Pager [phone]  If 7PM-7AM, please contact night-coverage [email or web address] Password [email or web address] 06/04/2018, 11:07 AM

## 2018-06-04 NOTE — Clinical Social Work Placement (Signed)
   CLINICAL SOCIAL WORK PLACEMENT  NOTE  Date:  06/04/2018  Patient Details  Name: Matthew Wilkins MRN: [id number] Date of Birth: 06-24-1945  Clinical Social Work is seeking post-discharge placement for this patient at the [place] level of care (*CSW will initial, date and re-position this form in  chart as items are completed):      Patient/family provided with [facility] Work Department's list of facilities offering this level of care within the geographic area requested by the patient (or if unable, by the patient's family).  Yes   Patient/family informed of their freedom to choose among providers that offer the needed level of care, that participate in Medicare, Medicaid or managed care program needed by the patient, have an available bed and are willing to accept the patient.  Yes   Patient/family informed of [facility]'s ownership interest in [facility] and [facility], as well as of the fact that they are under no obligation to receive care at these facilities.  PASRR submitted to EDS on 06/04/18     PASRR number received on 06/04/18     Existing PASRR number confirmed on       FL2 transmitted to all facilities in geographic area requested by pt/family on 05/31/18     FL2 transmitted to all facilities within larger geographic area on       Patient informed that his/her managed care company has contracts with or will negotiate with certain facilities, including the following:        Yes   Patient/family informed of bed offers received.  Patient chooses bed at [facility]     Physician recommends and patient chooses bed at      Patient to be transferred to   on  .  Patient to be transferred to facility by       Patient family notified on   of transfer.  Name of family member notified:        PHYSICIAN       Additional Comment: Spoke with daughter Mateo Flow last Thursday and Friday who was tring to decide how she  wanted to proceed in re: to her father, the patient.  She stated she needed to let other family members know what was going on, and would have an answer for me on Monday [today.]  Today she stated that she chooses [facility] for him. Alerted Hinton Dyer at [facility].  Submitted and received PASRR number.   _______________________________________________ Trish Mage, LCSW 06/04/2018, 4:23 PM

## 2018-06-04 NOTE — Progress Notes (Signed)
Patients bed alarm going off. Upon entering the room patient was found standing on the bed naked holding on to the headboard. Patient was assisted to lay back down and settled back into bed. Call bell within reach. Bed alarm is on. Dr. Myna Hidalgo notified.

## 2018-06-05 LAB — GLUCOSE, CAPILLARY
GLUCOSE-CAPILLARY: 88 mg/dL (ref 70–99)
Glucose-Capillary: 123 mg/dL — ABNORMAL HIGH (ref 70–99)

## 2018-06-05 NOTE — Progress Notes (Signed)
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 -Patient is a very poor historian,Reports originally coming to the hospital yesterday with body aches leg pain,now tells me that his left leg has been weaker and have heavier for the last several weeks. -Lives independently, uses a walker and cane -Reason ended up getting a CT scan of his abdomen pelvis in the emergency room on admission yesterday which noted a 2.3 cm stone in the proximal ureter and some enhancement of the right renal pelvis,case was discussed with urologyDr.Winter,who recommended antibiotics and outpatient follow-up for definitive stone management. He has had his antibiotics discontinued on 1/16 and states that his pain is improved. He continues to have periods of confusion and some hallucinations according to daughter at bedside and has poor appetite with some episodes of nausea and vomiting noted.  He has been noted to have severe hypothyroidism noted on lab work for which IV Synthroid had been initiated along with some hydrocortisone on 1/19.  Patient continues to have some hallucinations and agitation overnight which appears to be a sundowning phenomenon related to delirium and possible dementia.  Started back on Seroquel and appears to be clinically doing much better at this point.   Assessment & Plan:   Active Problems:   UTI (urinary tract infection)   Altered mental status  Right renal pelvis/proximal ureter stone withquestionablepyelonephritis -Symptoms do not really point to this however patient is a terrible historian -Patient is completed 3 days of IV Rocephin with no signs of UTI on UA and  therefore antibiotics were canceled on 1/16. -Urology follow-upas an outpatient. -No symptoms of sepsis -Maintain on IV fluid due to poor appetite -Nursing staff to give report on oral intake as well as bowel movement  Acute encephalopathy-ongoing,likely secondary to delirium in the setting of severe hypothyroidism -Patient is noted to have some mild atrophy on brain MRI but no acute findings -Discontinue IV Synthroid as well as IV hydrocortisone and maintain on oral Synthroid at this point. -Continue on Seroquel and continue to hold gabapentin and Requip for now. -Worsening debility also noted with PT/OT recommendations for SNF on discharge if patient agrees to go.  Clinical social worker has discussed placement with daughter and they are agreeable to [facility] once bed is available.  Left leg weakness -history is limited and reports worsening weakness more on the left side over the last 2 weeks -Patient is on Eliquis hence embolic stroke would be less likely however his compliance is uncertain -MRI brain; no acute abnormalities -Other possibility is worsening peripheral neuropathy from chemotherapy for lung cancer -PT OT evalwith recommendation for SNF/Rehab  History of DVT/PE -Continue Eliquis  History of stage III adeno CA lung -Followed by Dr. Earlie Server -Had been treated with chemoradiation followed by consolidation immunotherapy however significant difficulties with goingback and forthto the clinic and getting treatment, currently immunotherapy is on hold  Diabetes mellitus-controlled -On metformin and glipizide at home, continue sliding scale insulin for now with adequate blood glucose control noted.  Hypertension-controlled -Home lisinopril restarted at this point and will hold hydrocortisone due to blood pressure elevation.   DVT prophylaxis:Apixaban Code Status:Full code Family Communication:Daughter, Valerie at bedside Disposition Plan:Continue  IV fluid for now  and continue monitor oral intake as well as neurological status.  This appears to have improved on Seroquel.  Continue to treat hypothyroidism with oral levothyroxine.  Social worker working on [employer] placement.  Consultants:  D/w Urology Dr.Winter on admission  Procedures:  None  Antimicrobials:  Rocephin 1/13-1/16   Subjective: Patient seen and evaluated today with no new acute complaints or concerns. No acute concerns or events noted overnight.  He is sleeping as he was awake most of the night and his daughter is at bedside and I have discussed with her the need to go to SNF and remain on Seroquel.  Objective: Vitals:   06/04/18 2010 06/04/18 2131 06/05/18 0456 06/05/18 1436  BP:  125/70 124/66 110/69  Pulse:  85 71 79  Resp:   17 17  Temp:  97.9 F (36.6 C) (!) 97.4 F (36.3 C) 98 F (36.7 C)  TempSrc:  Oral Oral Oral  SpO2: 94% 97% 97% 98%  Weight:      Height:        Intake/Output Summary (Last 24 hours) at 06/05/2018 1620 Last data filed at 06/05/2018 1200 Gross per 24 hour  Intake 1571.88 ml  Output 750 ml  Net 821.88 ml   Filed Weights   05/28/18 1748  Weight: 68 kg    Examination:  General exam: Appears calm and comfortable, somnolent Respiratory system: Clear to auscultation. Respiratory effort normal. Cardiovascular system: S1 & S2 heard, RRR. No JVD, murmurs, rubs, gallops or clicks. No pedal edema. Gastrointestinal system: Abdomen is nondistended, soft and nontender. No organomegaly or masses felt. Normal bowel sounds heard. Central nervous system: Alert Extremities: Symmetric 5 x 5 power. Skin: No rashes, lesions or ulcers Psychiatry: Difficult to assess    Data Reviewed: I have personally reviewed following labs and imaging studies  CBC: Recent Labs  Lab 05/31/18 0520 06/02/18 0620 06/03/18 0640 06/04/18 0759  WBC 3.2* 3.6* 4.2 5.4  HGB 11.3* 10.6* 10.4* 11.6*  HCT 35.0* 31.7* 31.7* 34.3*  MCV 87.7 85.0 86.4 83.5   PLT 148* 160 163 921   Basic Metabolic Panel: Recent Labs  Lab 05/31/18 0520 06/02/18 0620 06/03/18 0640 06/04/18 0759  NA 135 130* 136 135  K 3.6 3.5 3.2* 3.1*  CL 106 100 107 106  CO2 22 22 23  19*  GLUCOSE 81 106* 110* 170*  BUN 8 12 10 9   CREATININE 0.96 1.06 1.05 0.96  CALCIUM 8.3* 8.9 8.5* 8.8*  MG  --   --   --  1.9   GFR: Estimated Creatinine Clearance: 64.9 mL/min (by C-G formula based on SCr of 0.96 mg/dL). Liver Function Tests: No results for input(s): AST, ALT, ALKPHOS, BILITOT, PROT, ALBUMIN in the last 168 hours. No results for input(s): LIPASE, AMYLASE in the last 168 hours. Recent Labs  Lab 06/02/18 0756  AMMONIA 12   Coagulation Profile: No results for input(s): INR, PROTIME in the last 168 hours. Cardiac Enzymes: No results for input(s): CKTOTAL, CKMB, CKMBINDEX, TROPONINI in the last 168 hours. BNP (last 3 results) No results for input(s): PROBNP in the last 8760 hours. HbA1C: No results for input(s): HGBA1C in the last 72 hours. CBG: Recent Labs  Lab 06/04/18 0826 06/04/18 1210 06/04/18 1619 06/05/18 1137 06/05/18 1615  GLUCAP 149* 145* 116* 123* 88   Lipid Profile: No results for input(s): CHOL, HDL, LDLCALC, TRIG, CHOLHDL, LDLDIRECT in the last 72 hours. Thyroid Function Tests: No results for input(s): TSH, T4TOTAL, FREET4, T3FREE, THYROIDAB in the last 72 hours.  Anemia Panel: No results for input(s): VITAMINB12, FOLATE, FERRITIN, TIBC, IRON, RETICCTPCT in the last 72 hours. Sepsis Labs: No results for input(s): PROCALCITON, LATICACIDVEN in the last 168 hours.  Recent Results (from the past 240 hour(s))  Culture, blood (routine x 2)     Status: None   Collection Time: 05/28/18  7:53 PM  Result Value Ref Range Status   Specimen Description BLOOD RIGHT ARM  Final   Special Requests   Final    BOTTLES DRAWN AEROBIC AND ANAEROBIC Blood Culture adequate volume   Culture   Final    NO GROWTH 5 DAYS Performed at [facility], [address] [place], [place] [address]    Report Status 06/02/2018 FINAL  Final  Culture, blood (routine x 2)     Status: None   Collection Time: 05/28/18  7:59 PM  Result Value Ref Range Status   Specimen Description BLOOD LEFT ARM  Final   Special Requests   Final    BOTTLES DRAWN AEROBIC AND ANAEROBIC Blood Culture adequate volume   Culture   Final    NO GROWTH 5 DAYS Performed at [facility], [address], [place], [place] [address]    Report Status 06/02/2018 FINAL  Final  Urine culture     Status: None   Collection Time: 05/28/18 10:28 PM  Result Value Ref Range Status   Specimen Description   Final    URINE, CLEAN CATCH Performed at [facility], [address] [place], [place] [address]    Special Requests   Final    Normal Performed at [facility], [address] [place], [place] [address]    Culture   Final    NO GROWTH Performed at [facility], [place] [address] [place], [place] [address]    Report Status 05/30/2018 FINAL  Final         Radiology Studies: No results found.      Scheduled Meds: . apixaban  5 mg Oral BID  . bisacodyl  10 mg Rectal Once  . dicyclomine  20 mg Oral TID AC  . ezetimibe  10 mg Oral Daily  . insulin aspart  0-9 Units Subcutaneous TID WC  . levothyroxine  25 mcg Oral Q0600  . lisinopril  5 mg Oral Daily  . mometasone-formoterol  2 puff Inhalation BID  . pantoprazole  40 mg Oral Daily  . potassium chloride  40 mEq Oral BID  . QUEtiapine  50 mg Oral QHS  . rosuvastatin  20 mg Oral Daily  . sertraline  50 mg Oral Daily  . sorbitol, milk of mag, mineral oil, glycerin (SMOG) enema  960 mL Rectal Once  . sucralfate  1 g Oral TID WC & HS   Continuous Infusions: . sodium chloride 75 mL/hr at 06/05/18 0242     LOS: 2 days    Time spent: 30 minutes     Darleen Crocker, DO [facility] Pager [phone]  If 7PM-7AM, please contact night-coverage [email or web address] Password [email or web address] 06/05/2018, 4:20 PM

## 2018-06-06 LAB — BASIC METABOLIC PANEL
Anion gap: 5 (ref 5–15)
BUN: 14 mg/dL (ref 8–23)
CO2: 21 mmol/L — AB (ref 22–32)
Calcium: 8.3 mg/dL — ABNORMAL LOW (ref 8.9–10.3)
Chloride: 113 mmol/L — ABNORMAL HIGH (ref 98–111)
Creatinine, Ser: 1.24 mg/dL (ref 0.61–1.24)
GFR calc Af Amer: >60 mL/min (ref >60)
GFR calc non Af Amer: 57 mL/min — ABNORMAL LOW (ref >60)
Glucose, Bld: 124 mg/dL — ABNORMAL HIGH (ref 70–99)
Potassium: 4 mmol/L (ref 3.5–5.1)
Sodium: 139 mmol/L (ref 135–145)

## 2018-06-06 LAB — MAGNESIUM: Magnesium: 2 mg/dL (ref 1.7–2.4)

## 2018-06-06 LAB — GLUCOSE, CAPILLARY: Glucose-Capillary: 145 mg/dL — ABNORMAL HIGH (ref 70–99)

## 2018-06-06 MED ORDER — HYDROCODONE-ACETAMINOPHEN 5-325 MG PO TABS: ORAL_TABLET | ORAL | 0 refills | Status: AC

## 2018-06-06 MED ORDER — QUETIAPINE FUMARATE 25 MG PO TABS: ORAL_TABLET | ORAL | 1 refills | Status: AC

## 2018-06-06 MED ORDER — LEVOTHYROXINE SODIUM 50 MCG PO TABS: 50.0000 ug | ORAL_TABLET | Freq: Every day | ORAL | 11 refills | Status: AC

## 2018-06-06 MED ORDER — ROPINIROLE HCL 1 MG PO TABS: 3.0000 mg | ORAL_TABLET | Freq: Three times a day (TID) | ORAL | 1 refills | Status: AC

## 2018-06-06 MED ORDER — POTASSIUM CHLORIDE ER 20 MEQ PO TBCR: 20.0000 meq | EXTENDED_RELEASE_TABLET | Freq: Every day | ORAL | 0 refills | Status: AC

## 2018-06-06 MED ORDER — METFORMIN HCL ER 500 MG PO TB24: 500.0000 mg | ORAL_TABLET | Freq: Two times a day (BID) | ORAL | 2 refills | Status: AC

## 2018-06-06 NOTE — Care Management Important Message (Signed)
Important Message  Patient Details  Name: MELIK BLANCETT MRN: [id number] Date of Birth: Sep 17, 1945   Medicare Important Message Given:  Yes    Shelda Altes 06/06/2018, 11:10 AM

## 2018-06-06 NOTE — Progress Notes (Signed)
Physical Therapy Evaluation Patient Details Name: Matthew Wilkins MRN: [id number] DOB: [birth year] Today's Date: 06/06/2018   History of Present Illness  Matthew Wilkins  is a 73 y.o. 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  Today comes back to the hospital with generalized body aches.    Clinical Impression  Patient demonstrates improvement in trunk control while seated at bedside completing BLE ROM/strengthening exercises, no leaning backwards noted during sit to stands and able to ambulate in hallway without loss of balance.  Patient tolerated sitting up in chair after therapy.  Patient will benefit from continued physical therapy in hospital and recommended venue below to increase strength, balance, endurance for safe ADLs and gait.    Follow Up Recommendations SNF;Supervision/Assistance - 24 hour;Supervision for mobility/OOB    Equipment Recommendations  None recommended by PT    Recommendations for Other Services       Precautions / Restrictions Precautions Precautions: Fall Restrictions Weight Bearing Restrictions: No      Mobility  Bed Mobility Overal bed mobility: Needs Assistance Bed Mobility: Supine to Sit     Supine to sit: Min guard     General bed mobility comments: increased time  Transfers Overall transfer level: Needs assistance Equipment used: Rolling walker (2 wheeled) Transfers: Sit to/from [facility] Sit to Stand: Min assist Stand pivot transfers: Min assist       General transfer comment: labored movement  Ambulation/Gait Ambulation/Gait assistance: Min guard;Min assist Gait Distance (Feet): 45 Feet Assistive device: Rolling walker (2 wheeled) Gait Pattern/deviations: Decreased step length - right;Decreased step length -  left;Decreased stride length Gait velocity: decreased   General Gait Details: slightly labored unsteady cadence without loss of balance, limited mostly due to c/o fatigue  Stairs            Wheelchair Mobility    Modified Rankin (Stroke Patients Only)       Balance Overall balance assessment: Needs assistance Sitting-balance support: Feet supported;No upper extremity supported Sitting balance-Leahy Scale: Good     Standing balance support: Bilateral upper extremity supported;During functional activity Standing balance-Leahy Scale: Fair Standing balance comment: using RW                             Pertinent Vitals/Pain Pain Assessment: No/denies pain    Home Living                        Prior Function                 Hand Dominance        Extremity/Trunk Assessment                Communication      Cognition Arousal/Alertness: Awake/alert Behavior During Therapy: WFL for tasks assessed/performed Overall Cognitive Status: Within Functional Limits for tasks assessed                                        General Comments      Exercises General Exercises - Lower Extremity Long Arc Quad: Seated;AROM;Strengthening;Both;10 reps Hip Flexion/Marching: Seated;Strengthening;AROM;Both;10 reps Toe Raises: Seated;Strengthening;AROM;Both;10 reps Heel Raises: Seated;AROM;Both;10 reps;Strengthening   Assessment/Plan    PT  Assessment    PT Problem List         PT Treatment Interventions      PT Goals (Current goals can be found in the Care Plan section)  Acute Rehab PT Goals Patient Stated Goal: return home PT Goal Formulation: With patient Time For Goal Achievement: 06/14/18 Potential to Achieve Goals: Good    Frequency Min 3X/week   Barriers to discharge        Co-evaluation               AM-PAC PT "6 Clicks" Mobility  Outcome Measure Help needed turning from your back to your side while  in a flat bed without using bedrails?: None Help needed moving from lying on your back to sitting on the side of a flat bed without using bedrails?: A Little Help needed moving to and from a bed to a chair (including a wheelchair)?: A Little Help needed standing up from a chair using your arms (e.g., wheelchair or bedside chair)?: A Little Help needed to walk in hospital room?: A Little Help needed climbing 3-5 steps with a railing? : A Lot 6 Click Score: 18    End of Session Equipment Utilized During Treatment: Gait belt Activity Tolerance: Patient tolerated treatment well Patient left: in chair;with call bell/phone within reach;with chair alarm set Nurse Communication: Mobility status PT Visit Diagnosis: Unsteadiness on feet (R26.81);Other abnormalities of gait and mobility (R26.89);Muscle weakness (generalized) (M62.81)    Time: 1031-5945 PT Time Calculation (min) (ACUTE ONLY): 30 min   Charges:     PT Treatments $Gait Training: 8-22 mins $Therapeutic Exercise: 8-22 mins        11:26 AM, 06/06/18 Lonell Grandchild, MPT Physical Therapist with [facility] [phone] office [phone] mobile phone

## 2018-06-06 NOTE — Discharge Instructions (Signed)
1) repeat TSH in about 2 to 3 weeks 2)Repeat BMP in about 1 week 3) you are taking the blood thinner (apixaban/Eliquis)-- so Avoid ibuprofen/Advil/Aleve/Motrin/Goody Powders/Naproxen/BC powders/Meloxicam/Diclofenac/Indomethacin and other Nonsteroidal anti-inflammatory medications as these will make you more likely to bleed and can cause stomach ulcers, can also cause Kidney problems. 4) follow-up with urologist due to kidney stones in 1 to 2 weeks 5) NovoLog insulin sliding scale-with Accu-Cheks before meals and at bedtime insulin aspart (novoLOG) injection 0-9 Units  0-9 Units, Subcutaneous, 3 times daily with meals, First Dose today at 1200  Correction coverage: Sensitive (thin, NPO, renal)  CBG < 70: implement hypoglycemia protocol  CBG 70 - 120: 0 units  CBG 121 - 150: 1 unit  CBG 151 - 200: 2 units  CBG 201 - 250: 3 units  CBG 251 - 300: 5 units  CBG 301 - 350: 7 units  CBG 351 - 400: 9 units  CBG > 400: call MD

## 2018-06-06 NOTE — Discharge Summary (Signed)
Matthew Wilkins, is a 73 y.o. male  DOB 09-May-1946  MRN [id number].  Admission date:  05/28/2018  Admitting Physician  Oswald Hillock, MD  Discharge Date:  06/06/2018   Primary MD  [name], MD  Recommendations for primary care physician for things to follow:  1) repeat TSH in about 2 to 3 weeks 2)Repeat BMP in about 1 week 3) you are taking the blood thinner (apixaban/Eliquis)-- so Avoid ibuprofen/Advil/Aleve/Motrin/Goody Powders/Naproxen/BC powders/Meloxicam/Diclofenac/Indomethacin and other Nonsteroidal anti-inflammatory medications as these will make you more likely to bleed and can cause stomach ulcers, can also cause Kidney problems. 4) follow-up with urologist due to kidney stones in 1 to 2 weeks 5) NovoLog insulin sliding scale-with Accu-Cheks before meals and at bedtime insulin aspart (novoLOG) injection 0-9 Units  0-9 Units, Subcutaneous, 3 times daily with meals, First Dose today at 1200  Correction coverage: Sensitive (thin, NPO, renal)  CBG < 70: implement hypoglycemia protocol  CBG 70 - 120: 0 units  CBG 121 - 150: 1 unit  CBG 151 - 200: 2 units  CBG 201 - 250: 3 units  CBG 251 - 300: 5 units  CBG 301 - 350: 7 units  CBG 351 - 400: 9 units  CBG > 400: call MD   Admission Diagnosis  Right flank pain [R10.9] Generalized weakness [R53.1]  Discharge Diagnosis  Right flank pain [R10.9] Generalized weakness [R53.1]    Active Problems:   UTI (urinary tract infection)   Altered mental status    Past Medical History:  Diagnosis Date  . Adenocarcinoma of right lung, stage 3 ([place]) 11/17/2016  . Anxiety   . Arthritis   . Coronary artery disease   . Diabetes mellitus   . Diverticulitis   . DVT (deep venous thrombosis) ([place])   . Encounter for antineoplastic chemotherapy 11/17/2016  . GERD (gastroesophageal reflux disease)   . History of radiation therapy 11/28/16-01/06/17   right lung  was treated to 60 Gy in 30 fractions  . Hypercholesterolemia   . Hypertension   . PAD (peripheral artery disease) ([place])   . SOB (shortness of breath)     Past Surgical History:  Procedure Laterality Date  . CARDIAC CATHETERIZATION N/A 06/10/2015   Procedure: Left Heart Cath and Coronary Angiography;  Surgeon: Burnell Blanks, MD;  Location: [facility] CV LAB;  Service: Cardiovascular;  Laterality: N/A;  . LOWER EXTREMITY ANGIOGRAM N/A 07/13/2011   Procedure: LOWER EXTREMITY ANGIOGRAM;  Surgeon: Burnell Blanks, MD;  Location: [facility] CATH LAB;  Service: Cardiovascular;  Laterality: N/A;  . OTHER SURGICAL HISTORY    . OTHER SURGICAL HISTORY    . OTHER SURGICAL HISTORY    . PERCUTANEOUS STENT INTERVENTION Right 07/13/2011   Procedure: PERCUTANEOUS STENT INTERVENTION;  Surgeon: Burnell Blanks, MD;  Location: [facility] CATH LAB;  Service: Cardiovascular;  Laterality: Right;     HPI  from the history and physical done on the day of admission:    Matthew Wilkins  is a 73 y.o. 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  Today comes back to the hospital with generalized body aches. He denies chest pain, shortness of breath.  No cough. No nausea vomiting or diarrhea. Denies dysuria. In the ED CT scan of the abdomen and pelvis was done which showed increased enhancement of the right renal pelvis and 2.3 cm stone in the proximal ureter.  UA did not show any significant abnormality.  Dr. Laverta Baltimore discussed with urologist on-call Dr. Lovena Neighbours who recommended empiric antibiotics, follow urine cultures and patient can follow-up with urology as outpatient.  No active intervention needed in the hospital.    Hospital Course:   Brief Narrative:  Per HPI: 73 year old male with history of stage III  lung cancer, CAD, type 2 diabetes mellitus, DVT/PE on apixaban, history of CVA, hypertension, dyslipidemia recently discharged from any [facility] after treatment for COPD exacerbation. -Patient is a very poor historian,Reports originally coming to the hospital yesterday with body aches leg pain,now tells me that his left leg has been weaker and have heavier for the last several weeks. -Lives independently, uses a walker and cane -Reason ended up getting a CT scan of his abdomen pelvis in the emergency room on admission yesterday which noted a 2.3 cm stone in the proximal ureter and some enhancement of the right renal pelvis,case was discussed with urologyDr.Winter,who recommended antibiotics and outpatient follow-up for definitive stone management. He has had his antibiotics discontinued on 1/16 and states that his pain is improved. He continues to have periods of confusion and some hallucinations according to daughter at bedside and has poor appetite with some episodes of nausea and vomiting noted.  He has been noted to have severe hypothyroidism noted on lab work for which IV Synthroid hadbeen initiated along with some hydrocortisoneon 1/19. Patient continues to have some hallucinations and agitation overnight which appears to be a sundowning phenomenon related to delirium and possible dementia. Started back on Seroquel and appears to be clinically doing much better at this point.   Assessment & Plan:   Active Problems:   UTI (urinary tract infection)   Altered mental status  Right renal pelvis/proximal ureter stone withquestionablePyelonephritis -Symptoms do not really point to this however patient is a terrible historian  -Patient  completed IV Rocephin on 05/31/18,  no signs of UTI on UA--- urine and blood cultures remain negative-patient is largely asymptomatic from a UTI standpoint, no further antibiotics needed,  D/w Urology Dr.Winter on admission,  outpatient follow-up  with urologist for kidney stones strongly recommended   Acute encephalopathy-ongoing,likely secondary to delirium in the setting of severe hypothyroidism----TSH over 100  -Patient is noted to have some mild atrophy on brain MRI but no acute findings -Patient was treated with IV Synthroid as well as IV hydrocortisone --- okay to discharge on oral Synthroid 50 mcg daily repeat TSH in about 2 weeks as outpatient -Continue on Seroquel and resume Requip much lower dose of 1 mg 3 times daily   Left leg weakness as well as generalized weakness and debility -history is limited and reports worsening weakness more on the left side over the last 2 weeks -Patient is on Eliquis hence embolic stroke would be less likely however his compliance is uncertain -MRI brain without acute abnormalities, specifically no acute stroke -Other possibility is worsening peripheral neuropathy from chemotherapy for lung cancer -PT OT evalwith recommendation  for SNF/Rehab Patient's severe hypothyroidism is contributing to his generalized weakness and debility  History of DVT/PE -Stable, continue Eliquis  History of stage III adeno CA lung -Followed by Dr. Earlie Server -Had been treated with chemoradiation followed by immunotherapy however significant difficulties with goingback and forthto the clinic and getting treatment, currently immunotherapy is on hold  Diabetes mellitus-controlled --- Recent A1c .7.3, adjusted metformin and glipizide continue sliding scale insulin for nowwith adequate blood glucose control noted.  Hypertension-controlled -Stable, continue lisinopril 5 mg daily  DVT prophylaxis:Apixaban Code Status:Full code Family Communication:Daughter, Valerie at bedside Disposition Plan:  SNF placement.  Consultants:  D/w Urology Dr.Winter on admission  Procedures:  None  Antimicrobials:  Rocephin 1/13-1/16   Discharge Condition: stable  Follow UP  Contact information  for after-discharge care    [facility] .   Service:  Skilled Nursing Contact information: [address] [address] [phone]              Diet and Activity recommendation:  As advised  Discharge Instructions    Discharge Instructions    Call MD for:   Complete by:  As directed    Call MD for:  difficulty breathing, headache or visual disturbances   Complete by:  As directed    Call MD for:  persistant dizziness or light-headedness   Complete by:  As directed    Call MD for:  persistant nausea and vomiting   Complete by:  As directed    Call MD for:  severe uncontrolled pain   Complete by:  As directed    Call MD for:  temperature >100.4   Complete by:  As directed    Diet - low sodium heart healthy   Complete by:  As directed    Discharge instructions   Complete by:  As directed    1) repeat TSH in about 2 to 3 weeks 2)Repeat BMP in about 1 week 3) you are taking the blood thinner (apixaban/Eliquis)-- so Avoid ibuprofen/Advil/Aleve/Motrin/Goody Powders/Naproxen/BC powders/Meloxicam/Diclofenac/Indomethacin and other Nonsteroidal anti-inflammatory medications as these will make you more likely to bleed and can cause stomach ulcers, can also cause Kidney problems. 4) follow-up with urologist due to kidney stones in 1 to 2 weeks 5) NovoLog insulin sliding scale-with Accu-Cheks before meals and at bedtime insulin aspart (novoLOG) injection 0-9 Units  0-9 Units, Subcutaneous, 3 times daily with meals, First Dose today at 1200  Correction coverage: Sensitive (thin, NPO, renal)  CBG < 70: implement hypoglycemia protocol  CBG 70 - 120: 0 units  CBG 121 - 150: 1 unit  CBG 151 - 200: 2 units  CBG 201 - 250: 3 units  CBG 251 - 300: 5 units  CBG 301 - 350: 7 units  CBG 351 - 400: 9 units  CBG > 400: call MD   Increase activity slowly   Complete by:  As directed         Discharge Medications       Allergies as of 06/06/2018      Reactions   Aspirin Other (See Comments)   Nose bleeds       Medication List    STOP taking these medications   predniSONE 20 MG tablet Commonly known as:  DELTASONE     TAKE these medications   ADVAIR DISKUS 250-50 MCG/DOSE Aepb Generic drug:  Fluticasone-Salmeterol Inhale 1 puff into the lungs every 12 (twelve) hours.   albuterol 108 (90 Base) MCG/ACT  inhaler Commonly known as:  PROVENTIL HFA;VENTOLIN HFA Inhale 1-2 puffs into the lungs every 6 (six) hours as needed for wheezing or shortness of breath.   apixaban 5 MG Tabs tablet Commonly known as:  ELIQUIS Take 1 tablet (5 mg total) by mouth 2 (two) times daily.   dicyclomine 20 MG tablet Commonly known as:  BENTYL Take 1 tablet by mouth 3 (three) times daily.   gabapentin 400 MG capsule Commonly known as:  NEURONTIN Take 400 mg by mouth 3 (three) times daily.   glipiZIDE 5 MG 24 hr tablet Commonly known as:  GLUCOTROL XL Take 5 mg by mouth daily with breakfast.   HYDROcodone-acetaminophen 5-325 MG tablet Commonly known as:  NORCO/VICODIN 1 tab PO q12 hours prn pain   levothyroxine 50 MCG tablet Commonly known as:  SYNTHROID Take 1 tablet (50 mcg total) by mouth daily.   lisinopril 5 MG tablet Commonly known as:  PRINIVIL,ZESTRIL Take 5 mg by mouth daily.   metFORMIN 500 MG 24 hr tablet Commonly known as:  GLUCOPHAGE-XR Take 1 tablet (500 mg total) by mouth 2 (two) times daily with a meal. What changed:    how much to take  when to take this   omeprazole 40 MG capsule Commonly known as:  PRILOSEC Take 40 mg by mouth daily.   ondansetron 4 MG tablet Commonly known as:  ZOFRAN Take 1 tablet (4 mg total) by mouth every 6 (six) hours as needed.   Potassium Chloride ER 20 MEQ Tbcr Take 20 mEq by mouth daily. 1 tab daily by mouth   QUEtiapine 25 MG tablet Commonly known as:  SEROQUEL 25 mg q am AND 25 mg QHS   rOPINIRole 1 MG tablet Commonly known as:   REQUIP Take 3 tablets (3 mg total) by mouth 3 (three) times daily. What changed:    medication strength  when to take this   rosuvastatin 20 MG tablet Commonly known as:  CRESTOR Take 1 tablet (20 mg total) by mouth daily.   sertraline 50 MG tablet Commonly known as:  ZOLOFT Take 50 mg by mouth daily.   sucralfate 1 GM/10ML suspension Commonly known as:  CARAFATE Take 10 mLs (1 g total) by mouth 4 (four) times daily -  with meals and at bedtime.   tiotropium 18 MCG inhalation capsule Commonly known as:  SPIRIVA HANDIHALER Place 1 capsule (18 mcg total) into inhaler and inhale daily.   ZETIA 10 MG tablet Generic drug:  ezetimibe Take 10 mg by mouth daily.       Major procedures and Radiology Reports - PLEASE review detailed and final reports for all details, in brief -   Dg Chest 2 View  Result Date: 05/28/2018 CLINICAL DATA:  Generalized body aches.  Flu like symptoms. EXAM: CHEST - 2 VIEW COMPARISON:  April 24, 2018 FINDINGS: Radiation changes to the right hilum consistent with history of treated lung cancer. The cardiomediastinal silhouette is stable. No pneumothorax. No new nodules or masses. No focal infiltrates. IMPRESSION: Postradiation changes to the right lung.  No acute infiltrate. Electronically Signed   By: Dorise Bullion III M.D   On: 05/28/2018 18:54   Dg Abd 1 View  Result Date: 05/31/2018 CLINICAL DATA:  Vomiting EXAM: ABDOMEN - 1 VIEW COMPARISON:  April 24, 2018 FINDINGS: The bowel gas pattern is normal. Right kidney stone is identified unchanged compared prior exam. Extensive bowel content is identified throughout colon. Degenerative joint changes of the spine and bilateral hips are noted. IMPRESSION:  No bowel obstruction. Constipation. Electronically Signed   By: Abelardo Diesel M.D.   On: 05/31/2018 16:06   Mr Brain Wo Contrast  Result Date: 05/30/2018 CLINICAL DATA:  Weakness.  History of lung cancer EXAM: MRI HEAD WITHOUT CONTRAST TECHNIQUE:  Multiplanar, multiecho pulse sequences of the brain and surrounding structures were obtained without intravenous contrast. COMPARISON:  MRI head 11/20/2017 FINDINGS: Brain: Image quality degraded by motion. Mild atrophy. Negative for acute infarct. Negative for hemorrhage mass or edema. No evidence of metastatic disease on unenhanced imaging. Minimal chronic white matter changes. Vascular: Normal arterial flow voids Skull and upper cervical spine: Negative Sinuses/Orbits: Mild mucosal edema paranasal sinuses. Mild mastoid effusion bilaterally. Normal orbit Other: None IMPRESSION: Image quality degraded by moderate motion No acute abnormality Electronically Signed   By: Franchot Gallo M.D.   On: 05/30/2018 13:44   Ct Abdomen Pelvis W Contrast  Result Date: 05/28/2018 CLINICAL DATA:  Generalized body aches, back and leg pain. EXAM: CT ABDOMEN AND PELVIS WITH CONTRAST TECHNIQUE: Multidetector CT imaging of the abdomen and pelvis was performed using the standard protocol following bolus administration of intravenous contrast. CONTRAST:  167mL ISOVUE-300 IOPAMIDOL (ISOVUE-300) INJECTION 61% COMPARISON:  Body CT 04/17/2018 FINDINGS: Lower chest: Normal heart size. Heavy calcific atherosclerotic disease of the coronary arteries. Persistent patchy peribronchial airspace thickening in the right lower lobe, partially visualized. Small right pleural effusion. Small hiatal hernia. Hepatobiliary: No focal liver abnormality is seen. No gallstones, gallbladder wall thickening, or biliary dilatation. Pancreas: Unremarkable. No pancreatic ductal dilatation or surrounding inflammatory changes. Spleen: Normal in size without focal abnormality. Adrenals/Urinary Tract: Normal appearance of the adrenal glands and left kidney. Re demonstrated is 2.3 cm calculus in the posterior right pelvis. There stable prominence of the right collecting system with hyperenhancement of the urothelium at the level of the renal pelvis and proximal  ureter. There is a second nonobstructive calculus in the superior pole of the right kidney measuring 7 mm. There is a 2 mm bladder calculus in the region of the right trigone. 13 mm mid polar right renal cyst. Stomach/Bowel: Stomach is within normal limits. Appendix appears normal. No evidence of bowel wall thickening, distention, or inflammatory changes. Scattered colonic diverticulosis. Vascular/Lymphatic: Aortic atherosclerosis. No enlarged abdominal or pelvic lymph nodes. Reproductive: Nonspecific enlargement of the prostate gland. Other: No abdominal wall hernia or abnormality. No abdominopelvic ascites. Musculoskeletal: Spondylosis of the lumbosacral spine. IMPRESSION: 2.3 cm calculus within the right renal pelvis. Hyperenhancement of the urothelium of the right renal pelvis and proximal right ureter, query infectious in etiology. Please correlate to urinalysis. 2 mm nonobstructive calculus within the right trigone region of the urinary bladder. Colonic diverticulosis without evidence of diverticulitis. Partially visualized peribronchial airspace thickening in the right lung base thought to represent posttreatment/postradiation changes. Persistent small right pleural effusion. Calcific atherosclerotic disease of the aorta and coronary arteries. Electronically Signed   By: Fidela Salisbury M.D.   On: 05/28/2018 22:17    Micro Results    Recent Results (from the past 240 hour(s))  Culture, blood (routine x 2)     Status: None   Collection Time: 05/28/18  7:53 PM  Result Value Ref Range Status   Specimen Description BLOOD RIGHT ARM  Final   Special Requests   Final    BOTTLES DRAWN AEROBIC AND ANAEROBIC Blood Culture adequate volume   Culture   Final    NO GROWTH 5 DAYS Performed at [facility], [address], [place], [place] [address]    Report  Status 06/02/2018 FINAL  Final  Culture, blood (routine x 2)     Status: None   Collection Time: 05/28/18  7:59 PM  Result Value Ref Range Status    Specimen Description BLOOD LEFT ARM  Final   Special Requests   Final    BOTTLES DRAWN AEROBIC AND ANAEROBIC Blood Culture adequate volume   Culture   Final    NO GROWTH 5 DAYS Performed at [facility], [address], [place], [place] [address]    Report Status 06/02/2018 FINAL  Final  Urine culture     Status: None   Collection Time: 05/28/18 10:28 PM  Result Value Ref Range Status   Specimen Description   Final    URINE, CLEAN CATCH Performed at [facility], [address] [place], [place] [address]    Special Requests   Final    Normal Performed at [facility], [address] [place], [place] [address]    Culture   Final    NO GROWTH Performed at [facility], [place] [address], [place], [place] [address]    Report Status 05/30/2018 FINAL  Final       Today   Subjective    Ewel Lona today has no new complaints , remains weak, daughter at bedside, questions answered         Patient has been seen and examined prior to discharge   Objective   Blood pressure (!) 118/59, pulse 85, temperature 98.8 F (37.1 C), temperature source Oral, resp. rate 17, height 5\' 9"  (1.753 m), weight 68 kg, SpO2 91 %.   Intake/Output Summary (Last 24 hours) at 06/06/2018 1047 Last data filed at 06/05/2018 1200 Gross per 24 hour  Intake 240 ml  Output -  Net 240 ml    Exam Gen:- Awake Alert, no acute distress HEENT:- [place].AT, No sclera icterus Neck-Supple Neck,No JVD,.  Lungs-  CTAB , good air movement bilaterally  CV- S1, S2 normal, regular Abd-  +ve B.Sounds, Abd Soft, No tenderness,    Extremity/Skin:- No  edema,   good pulses Psych-affect is intermittently confused and disoriented,  neuro-generalized weakness, but no new focal deficits, no tremors    Data Review   CBC w Diff:  Lab Results  Component Value Date   WBC 5.4 06/04/2018   HGB 11.6 (L) 06/04/2018   HGB 11.9 (L) 04/04/2018   HGB 15.3 05/04/2017   HCT 34.3 (L) 06/04/2018   HCT 44.4 05/04/2017    PLT 190 06/04/2018   PLT 155 04/04/2018   PLT 192 05/04/2017   LYMPHOPCT 27 05/28/2018   LYMPHOPCT 11.9 (L) 05/04/2017   MONOPCT 8 05/28/2018   MONOPCT 8.5 05/04/2017   EOSPCT 8 05/28/2018   EOSPCT 1.5 05/04/2017   BASOPCT 3 05/28/2018   BASOPCT 1.0 05/04/2017    CMP:  Lab Results  Component Value Date   NA 139 06/06/2018   NA 136 05/04/2017   K 4.0 06/06/2018   K 4.0 05/04/2017   CL 113 (H) 06/06/2018   CO2 21 (L) 06/06/2018   CO2 16 (L) 05/04/2017   BUN 14 06/06/2018   BUN 15.5 05/04/2017   CREATININE 1.24 06/06/2018   CREATININE 1.32 (H) 04/04/2018   CREATININE 1.1 05/04/2017   PROT 6.5 05/29/2018   PROT 7.6 05/04/2017   ALBUMIN 3.6 05/29/2018   ALBUMIN 3.9 05/04/2017   BILITOT 0.6 05/29/2018   BILITOT 0.4 04/04/2018   BILITOT 0.56 05/04/2017   ALKPHOS 45 05/29/2018   ALKPHOS 93 05/04/2017  AST 27 05/29/2018   AST 17 04/04/2018   AST 13 05/04/2017   ALT 11 05/29/2018   ALT <6 04/04/2018   ALT 7 05/04/2017  .   Total Discharge time is about 33 minutes  Roxan Hockey M.D on 06/06/2018 at 10:47 AM  Go to [email or web address] -  for contact info  [facility] - Office  [phone]

## 2018-06-06 NOTE — Clinical Social Work Placement (Signed)
   CLINICAL SOCIAL WORK PLACEMENT  NOTE  Date:  06/06/2018  Patient Details  Name: Matthew Wilkins MRN: [id number] Date of Birth: Jan 08, 1946  Clinical Social Work is seeking post-discharge placement for this patient at the [facility] [place] level of care (*CSW will initial, date and re-position this form in  chart as items are completed):      Patient/family provided with [facility] Work Department's list of facilities offering this level of care within the geographic area requested by the patient (or if unable, by the patient's family).  Yes   Patient/family informed of their freedom to choose among providers that offer the needed level of care, that participate in Medicare, Medicaid or managed care program needed by the patient, have an available bed and are willing to accept the patient.  Yes   Patient/family informed of [facility]'s ownership interest in [facility] and [facility], as well as of the fact that they are under no obligation to receive care at these facilities.  PASRR submitted to EDS on 06/04/18     PASRR number received on 06/04/18     Existing PASRR number confirmed on       FL2 transmitted to all facilities in geographic area requested by pt/family on 05/31/18     FL2 transmitted to all facilities within larger geographic area on       Patient informed that his/her managed care company has contracts with or will negotiate with certain facilities, including the following:        Yes   Patient/family informed of bed offers received.  Patient chooses bed at [facility]     Physician recommends and patient chooses bed at      Patient to be transferred to [facility] on 06/06/18.  Patient to be transferred to facility by Facility     Patient family notified on 06/06/18 of transfer.  Name of family member notified:  Daughter      PHYSICIAN       Additional Comment: Consulted with PT who stated that patient can  transport in [place] on wheelchair.  Contacted facility to confirm. Updated nursing.  CSW sign off.   _______________________________________________ Trish Mage, LCSW 06/06/2018, 11:48 AM

## 2018-06-06 NOTE — Progress Notes (Signed)
BS 154 at 2200, did not cross over in Epic.

## 2018-06-19 LAB — GLUCOSE, CAPILLARY
GLUCOSE-CAPILLARY: 107 mg/dL — AB (ref 70–99)
Glucose-Capillary: 176 mg/dL — ABNORMAL HIGH (ref 70–99)
Glucose-Capillary: 94 mg/dL (ref 70–99)
Glucose-Capillary: 154 mg/dL — ABNORMAL HIGH (ref 70–99)

## 2018-07-26 ENCOUNTER — Telehealth: Payer: Self-pay | Admitting: *Deleted

## 2018-07-26 NOTE — Telephone Encounter (Signed)
[facility] Work  Clinical Social Work was referred by medical oncology RN to contact patient's daughter, Mateo Flow.  CSW is familiar with patient/daughter.  CSW contacted daughter by phone.  Mateo Flow reported her father is "not doing well" and she's recently moved him into her home in [place], [place].  Mateo Flow requested suggestions for help at home.  Patient has Medicare which typically does not cover home care services.  CSW provided information for the Care Choices program through [facility]- patient's daughter agreed to contact them to determine if patient is eligible for the program.  Gwinda [place], [place]

## 2018-08-03 ENCOUNTER — Ambulatory Visit (HOSPITAL_COMMUNITY): Admission: RE | Admit: 2018-08-03 | Payer: Medicare HMO | Source: Ambulatory Visit

## 2018-08-03 ENCOUNTER — Inpatient Hospital Stay: Payer: Medicare HMO | Attending: Family Medicine

## 2018-08-06 ENCOUNTER — Telehealth: Payer: Self-pay | Admitting: Medical Oncology

## 2018-08-06 ENCOUNTER — Ambulatory Visit: Payer: Medicare HMO | Admitting: Internal Medicine

## 2018-08-06 NOTE — Telephone Encounter (Signed)
Per Luanna Salk , Pt passed away Thursday 3/19. Declining after poor po intake and not responding. Admitted to [facility] for feeding tube but became hemodynamically/unstable and procedure cancelled .

## 2018-08-09 ENCOUNTER — Ambulatory Visit: Payer: Medicare HMO | Admitting: Internal Medicine

## 2018-08-15 DEATH — deceased

## 2020-06-17 IMAGING — CT CT ABD-PELV W/ CM
2 of 5 series · 16 of 46 positions shown, 18 images · IV contrast (Isovue)
Comparison: 02/21/2018.

CLINICAL DATA: Weakness today.

EXAM:
CT ABDOMEN AND PELVIS WITH CONTRAST
TECHNIQUE: Multidetector CT imaging of the abdomen and pelvis was performed
using the standard protocol following bolus administration of
intravenous contrast.
CONTRAST:  100mL [id number] IOPAMIDOL ([id number]) INJECTION 61%

[Series 2: axial st · axial · 0.71mm/px · z∈[+621,+1026]mm · 13 of 93 slices shown, 15 images]
[im 6/93  soft-tissue]
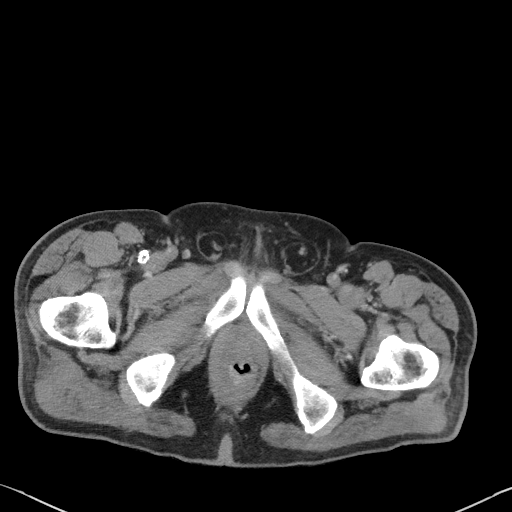
[im 6/93  bone]
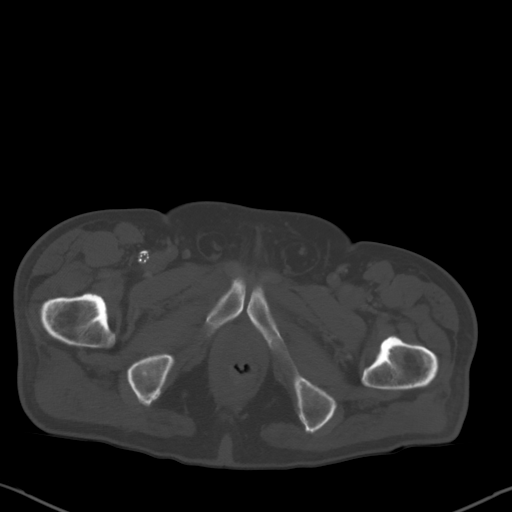
[im 11/93  soft-tissue]
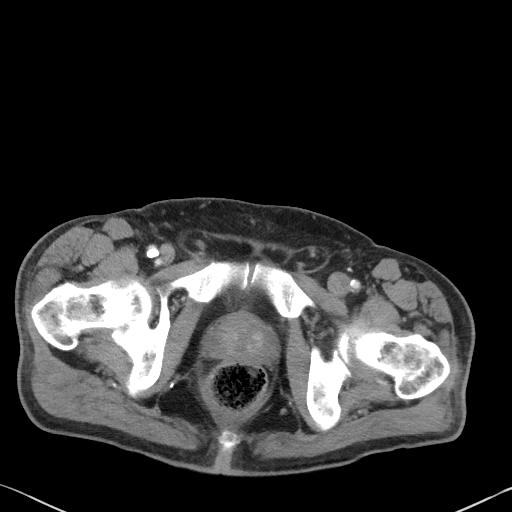
[im 21/93  soft-tissue]
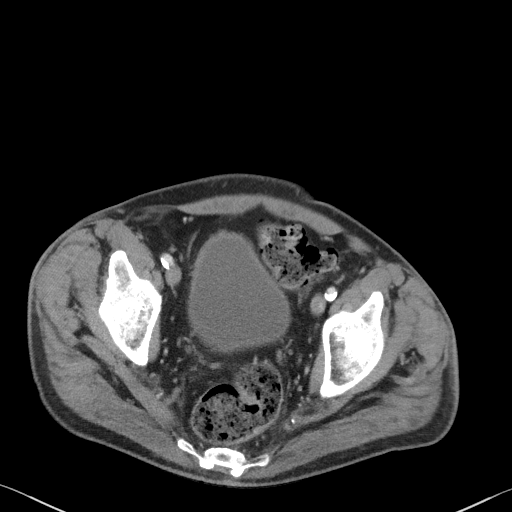
[im 26/93  soft-tissue]
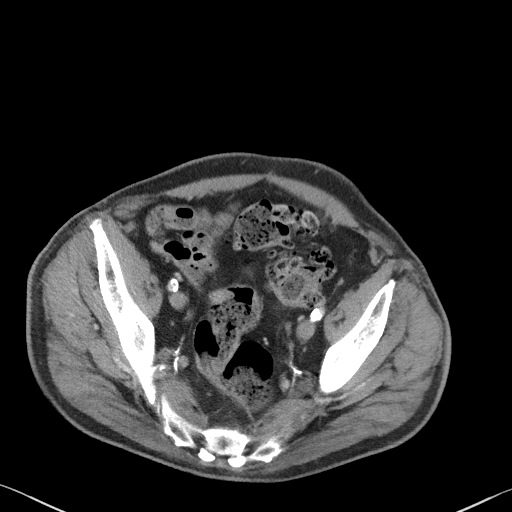
[im 31/93  soft-tissue]
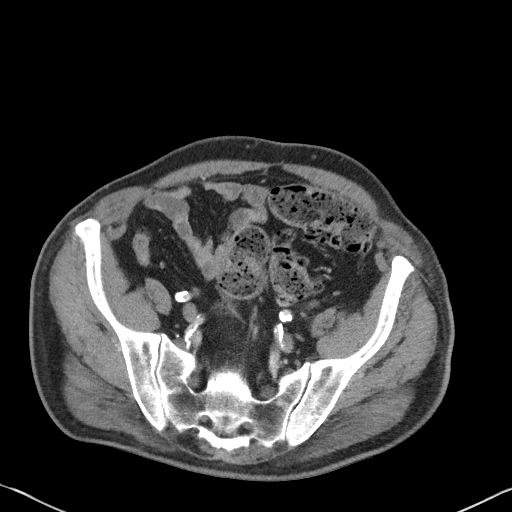
[im 41/93  soft-tissue]
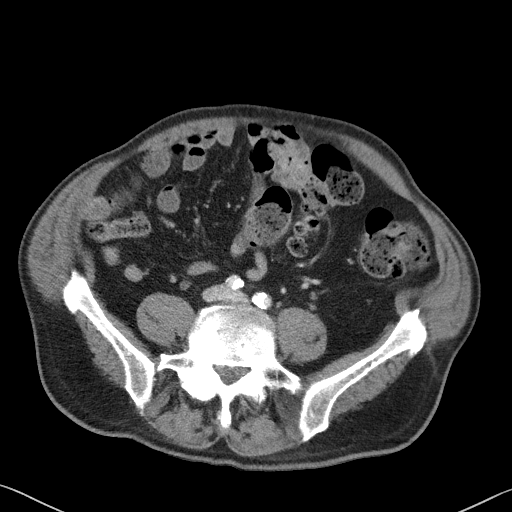
[im 47/93  soft-tissue]
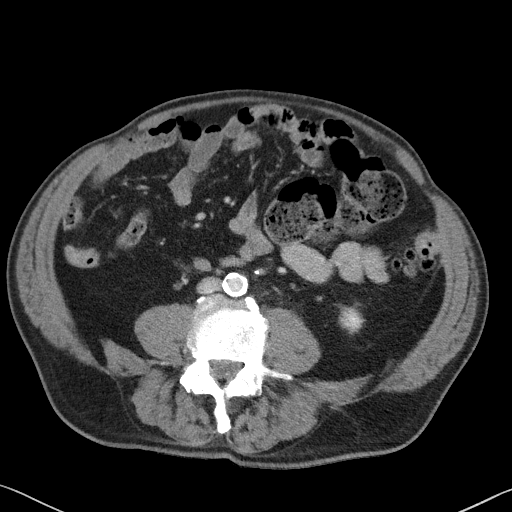
[im 52/93  soft-tissue]
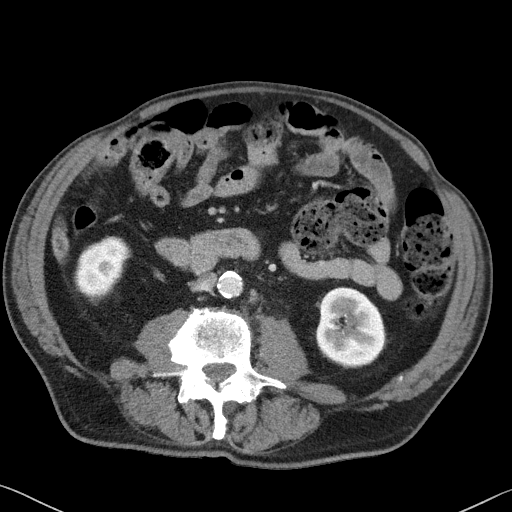
[im 62/93  soft-tissue]
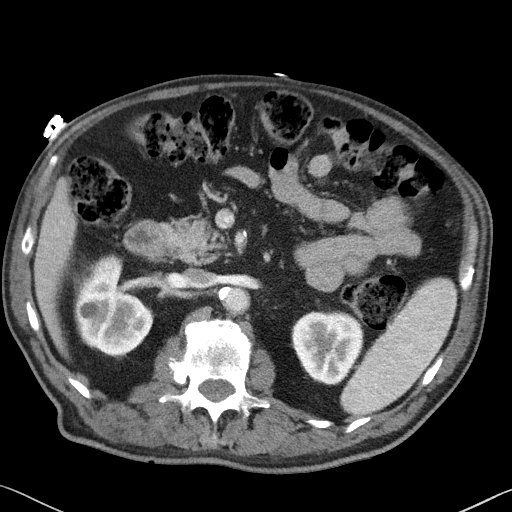
[im 62/93  bone]
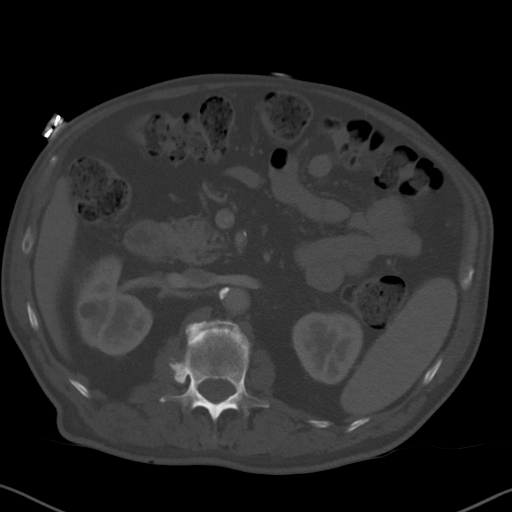
[im 67/93  soft-tissue]
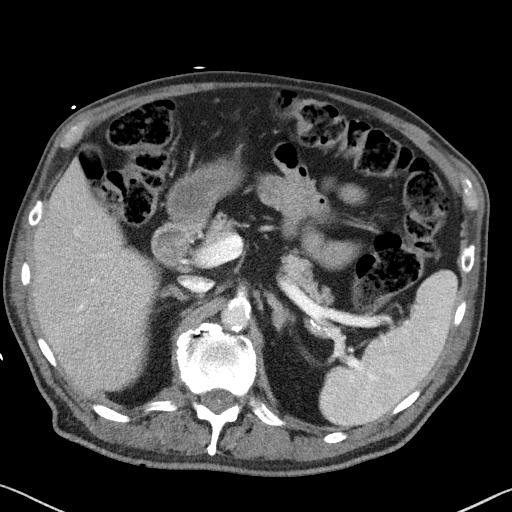
[im 72/93  soft-tissue]
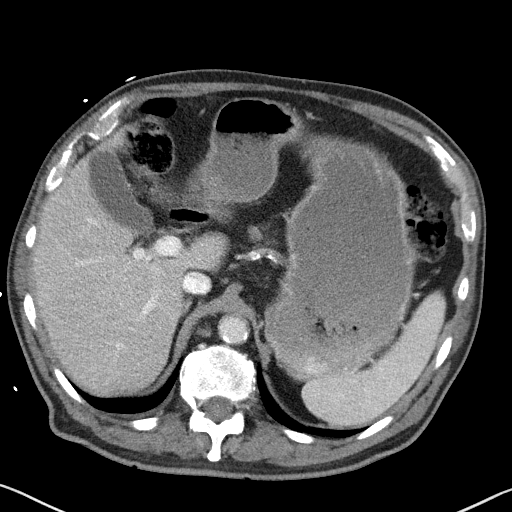
[im 82/93  soft-tissue]
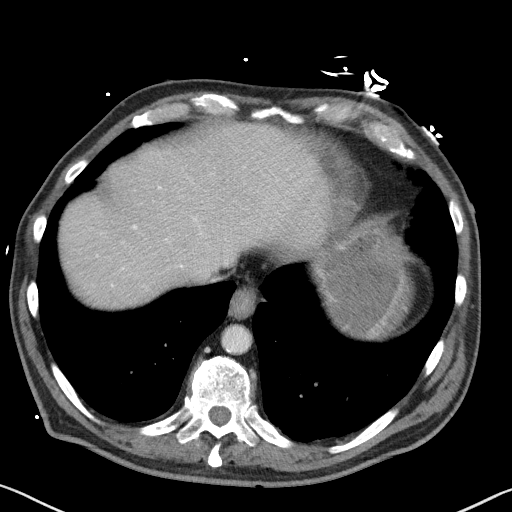
[im 87/93  soft-tissue]
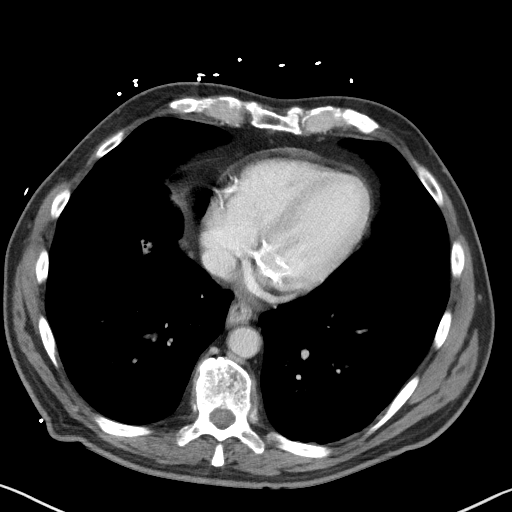

[Series 6: coronal st · coronal · 0.81mm/px · 3 of 114 slices shown]
[im 38/114  soft-tissue]
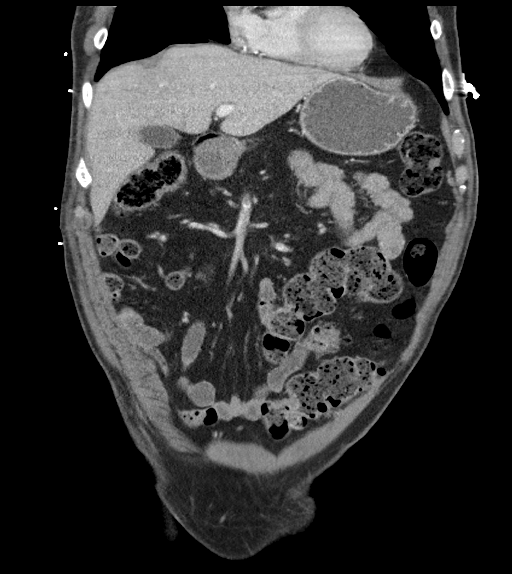
[im 51/114  soft-tissue]
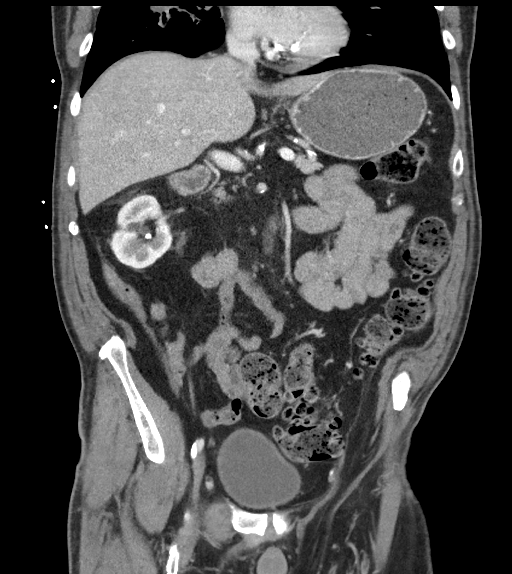
[im 63/114  soft-tissue]
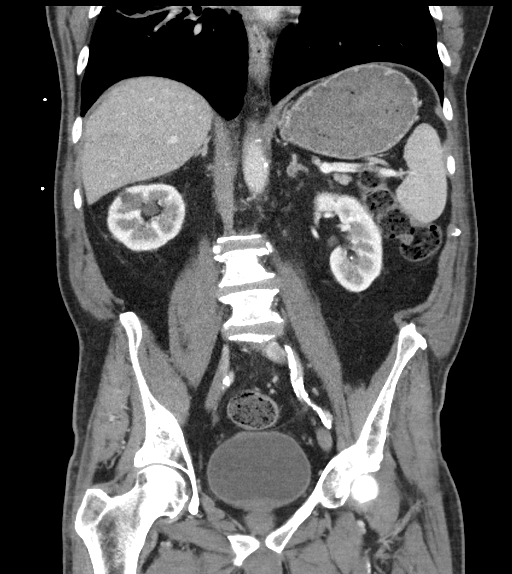

[16 of 46 positions shown; findings below may reference images not displayed]

FINDINGS: Lower chest: Progressive patchy opacity in the right lower lobe with
air bronchograms. Minimal dependent atelectasis in the left lower
lobe. No pleural fluid seen.

Hepatobiliary: No focal liver abnormality is seen. No gallstones,
gallbladder wall thickening, or biliary dilatation.

Pancreas: Unremarkable. No pancreatic ductal dilatation or
surrounding inflammatory changes.

Spleen: Normal in size without focal abnormality.

Adrenals/Urinary Tract: 2.1 cm calculus in the right renal pelvis
with mild dilatation of the right renal collecting system. There is
also an additional 7 mm calculus in the mid to lower right kidney
anteriorly. Also demonstrated is a small right renal cyst. There are
also several tiny lower pole left renal calculi.

Normal appearing adrenal glands, ureters and urinary bladder.

Stomach/Bowel: Multiple sigmoid and descending colon diverticula.
Prominent stool throughout the colon. No evidence of appendicitis.
Mildly dilated, mildly dilated stomach containing fluid and ingested
material. Unremarkable small bowel.

Vascular/Lymphatic: Atheromatous arterial calcifications without
aneurysm. No enlarged lymph nodes.

Reproductive: Mildly enlarged and heterogeneous prostate gland.

Other: Small bilateral inguinal hernias containing fat.

Musculoskeletal: Lumbar and lower thoracic spine degenerative
changes and mild scoliosis.
IMPRESSION: 1. Progressive right lower lobe pneumonia.
2. 2.1 cm calculus in the right renal pelvis causing mild right
hydronephrosis.
3. Small, nonobstructing bilateral renal calculi.
4. Prominent stool throughout the colon.
5. Sigmoid and descending colon diverticulosis.
6. Mild prostatic hypertrophy.

## 2020-06-17 IMAGING — DX DG CHEST 2V
2 series · 2 of 2 positions shown · non-contrast
Comparison: 02/21/2018

CLINICAL DATA: Weakness

EXAM:
CHEST - 2 VIEW

[chest ap]
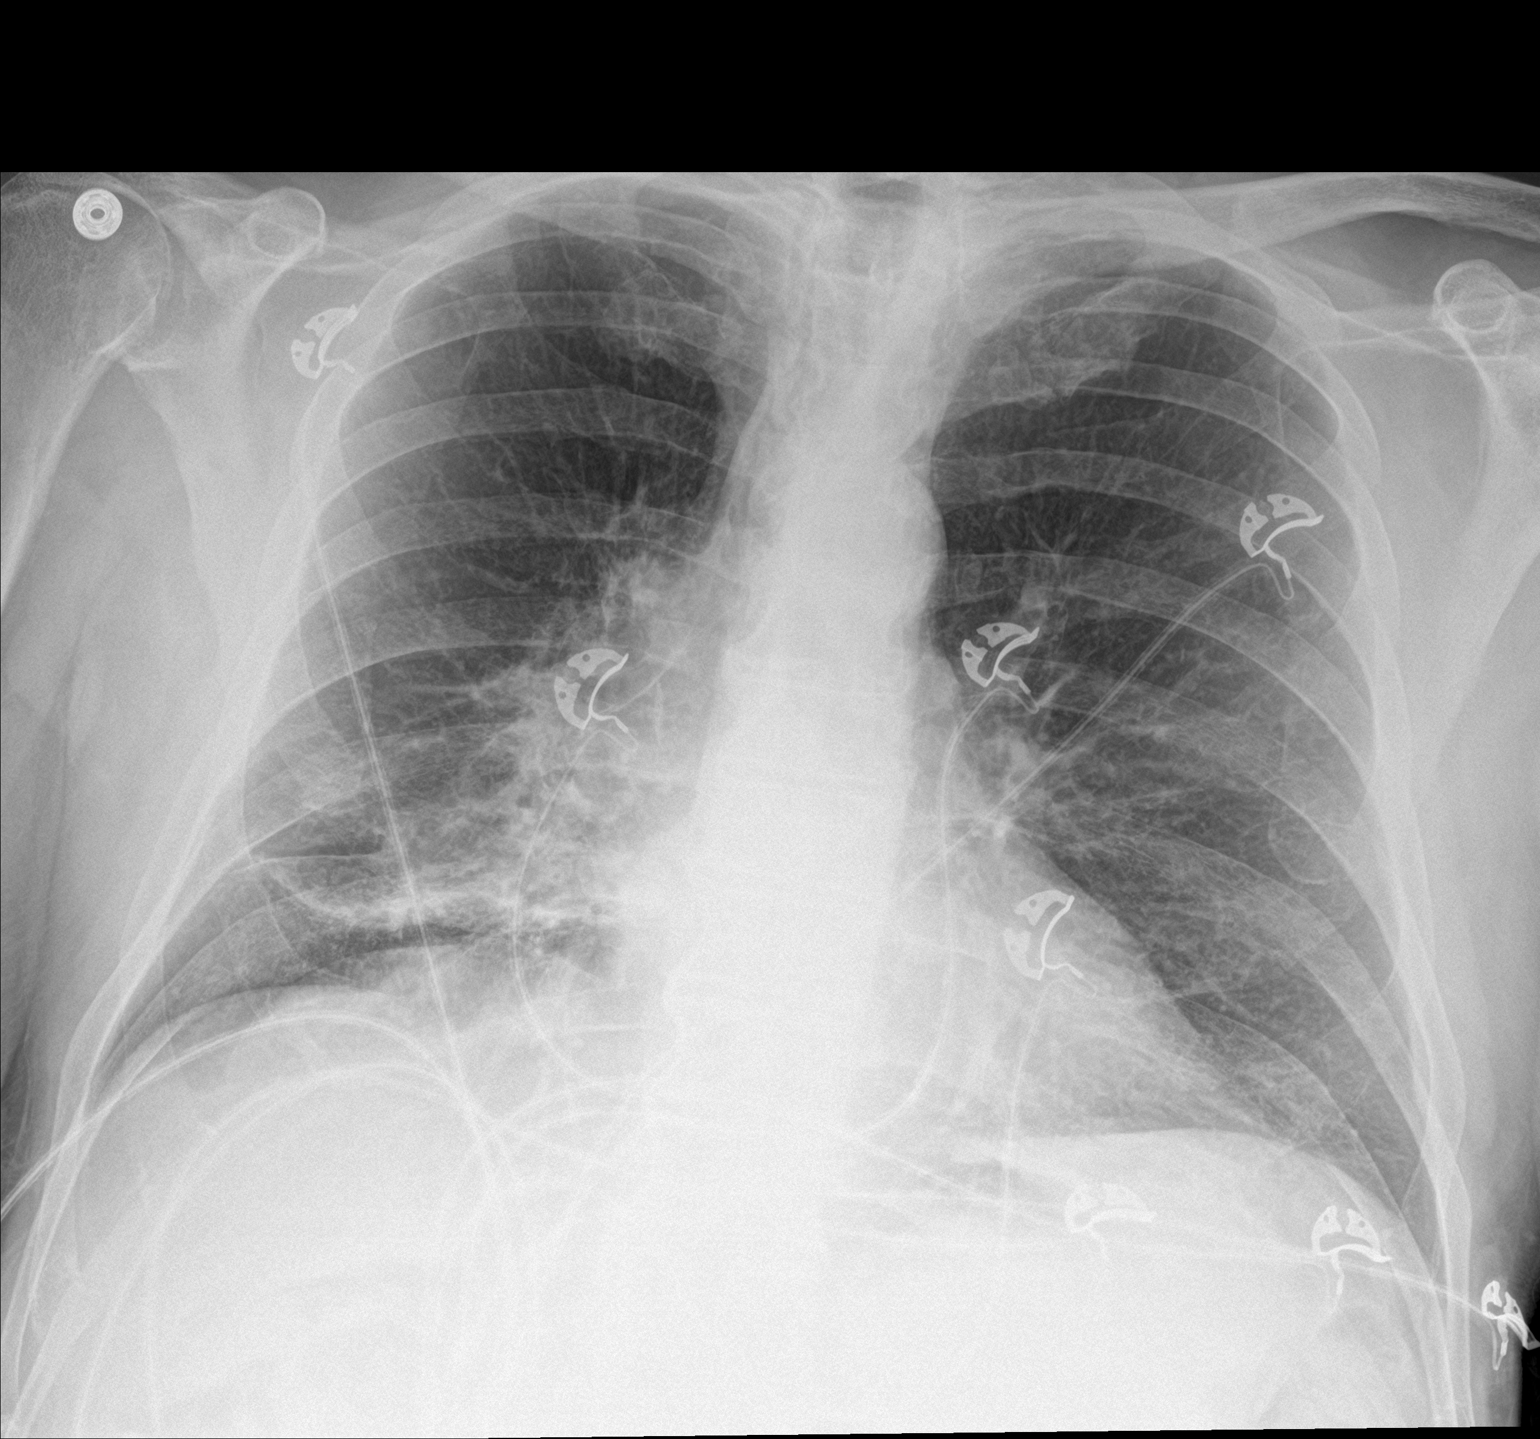

[chest lat]
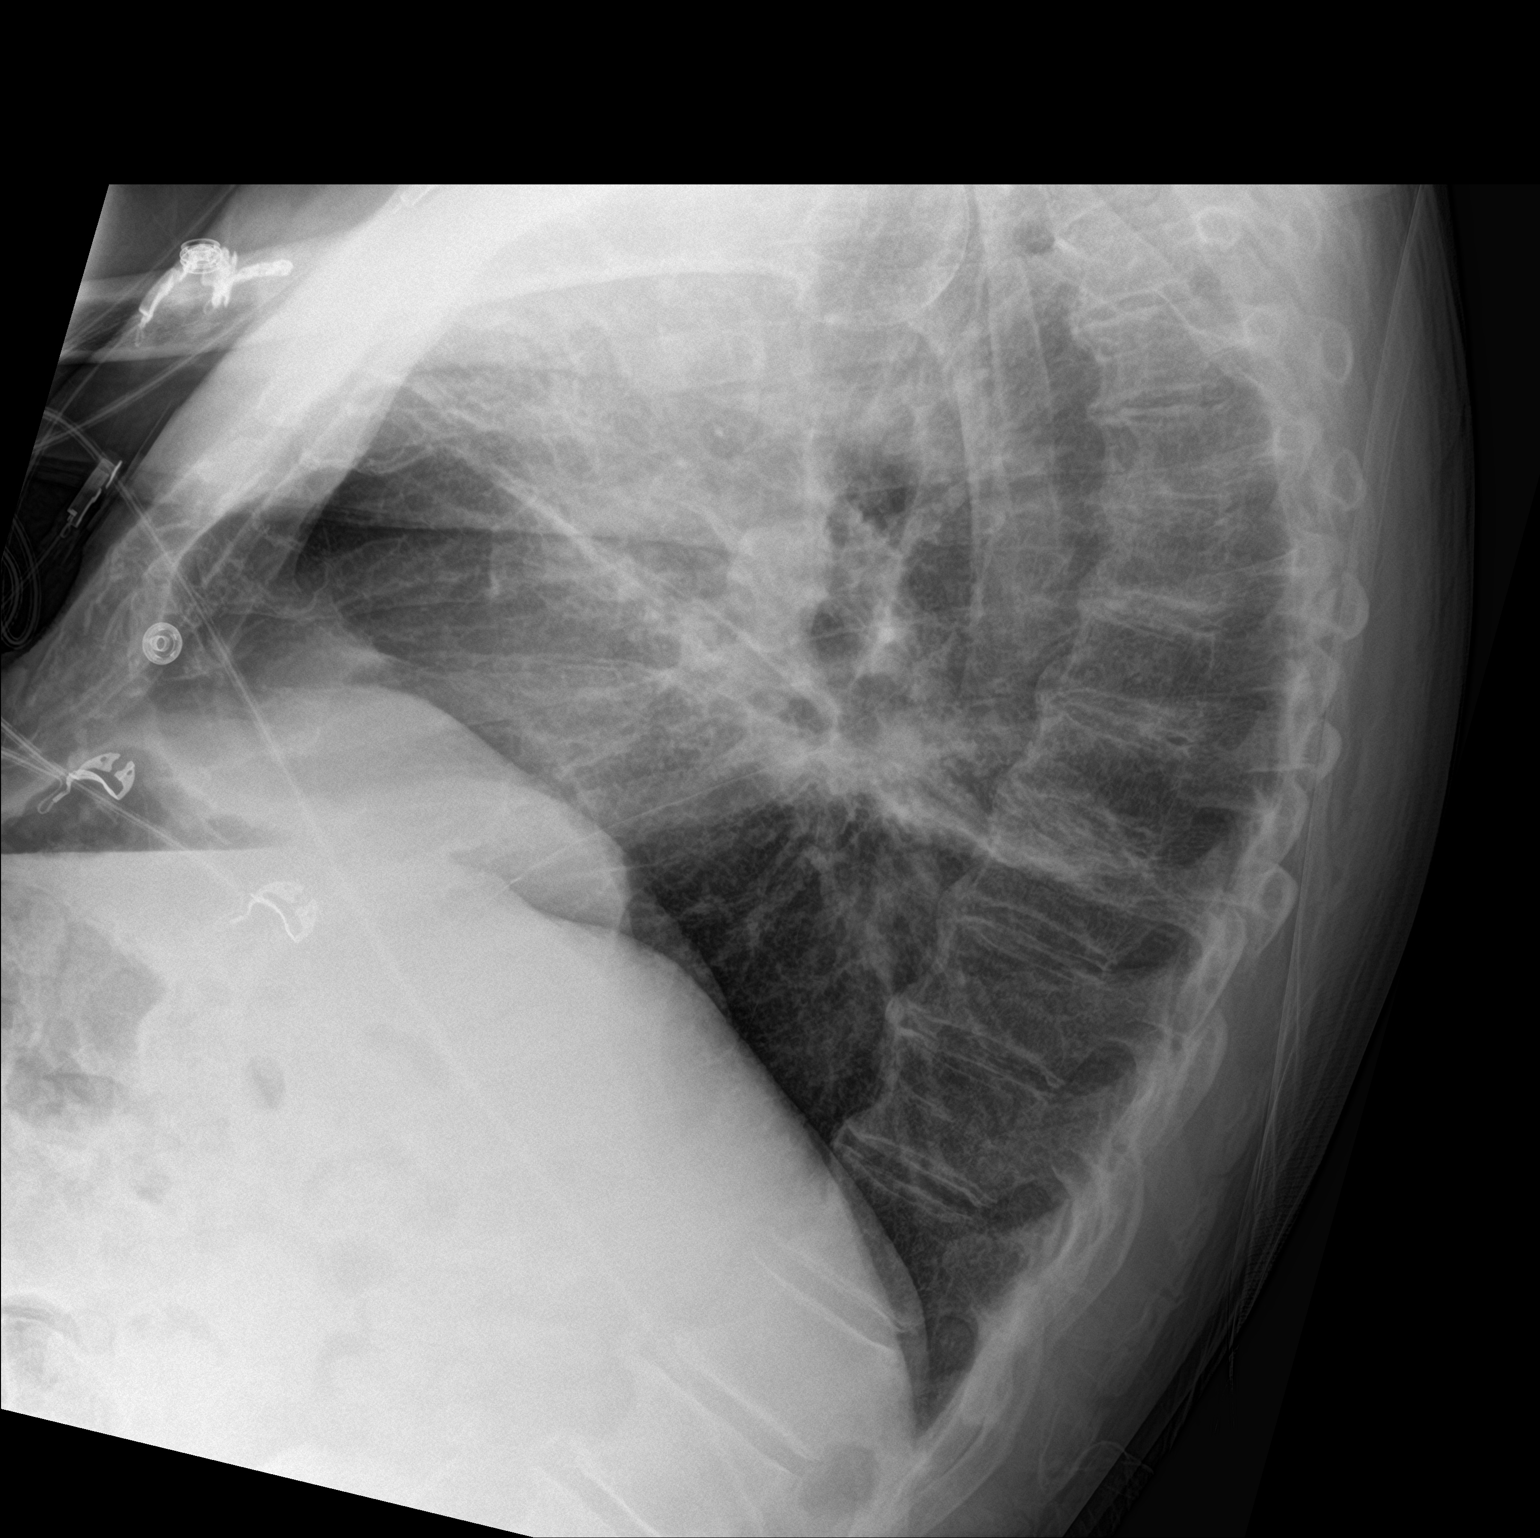

[2 of 2 positions shown; findings below may reference images not displayed]

FINDINGS: Heart is normal size. Patchy airspace disease in the right middle
and lower lobe lobes are similar prior study and likely reflect
radiation changes. No confluent opacity on the left. No effusions or
acute bony abnormality.
IMPRESSION: Postradiation changes in the right middle and lower lobes, stable
since prior study. No definite acute process.
